# Patient Record
Sex: Female | Born: 1950 | Race: White | Hispanic: No | Marital: Single | Smoking: Never smoker
Health system: Southern US, Community
[De-identification: ages and names within clinical notes are randomized; demographics above are authoritative.]

## PROBLEM LIST (undated history)

## (undated) DIAGNOSIS — E785 Hyperlipidemia, unspecified: Secondary | ICD-10-CM

## (undated) DIAGNOSIS — D649 Anemia, unspecified: Secondary | ICD-10-CM

## (undated) DIAGNOSIS — Z9109 Other allergy status, other than to drugs and biological substances: Secondary | ICD-10-CM

## (undated) DIAGNOSIS — B019 Varicella without complication: Secondary | ICD-10-CM

## (undated) DIAGNOSIS — B1910 Unspecified viral hepatitis B without hepatic coma: Secondary | ICD-10-CM

## (undated) HISTORY — DX: Other allergy status, other than to drugs and biological substances: Z91.09

## (undated) HISTORY — DX: Varicella without complication: B01.9

## (undated) HISTORY — PX: APPENDECTOMY: SHX54

## (undated) HISTORY — PX: ABDOMINAL HYSTERECTOMY: SHX81

## (undated) HISTORY — DX: Hyperlipidemia, unspecified: E78.5

---

## 1976-11-01 DIAGNOSIS — B1910 Unspecified viral hepatitis B without hepatic coma: Secondary | ICD-10-CM

## 1976-11-01 HISTORY — DX: Unspecified viral hepatitis B without hepatic coma: B19.10

## 2001-02-13 ENCOUNTER — Other Ambulatory Visit: Admission: RE | Admit: 2001-02-13 | Discharge: 2001-02-13 | Payer: Self-pay | Admitting: Obstetrics and Gynecology

## 2009-11-01 LAB — HM COLONOSCOPY

## 2011-12-30 ENCOUNTER — Other Ambulatory Visit: Payer: Self-pay

## 2011-12-30 ENCOUNTER — Encounter (HOSPITAL_COMMUNITY): Payer: Self-pay | Admitting: Emergency Medicine

## 2011-12-30 ENCOUNTER — Emergency Department (HOSPITAL_COMMUNITY): Payer: Medicaid Other

## 2011-12-30 ENCOUNTER — Inpatient Hospital Stay (HOSPITAL_COMMUNITY)
Admission: EM | Admit: 2011-12-30 | Discharge: 2012-01-19 | DRG: 028 | Disposition: A | Discharge status: Home Health | Payer: Medicaid Other | Attending: Internal Medicine | Admitting: Internal Medicine

## 2011-12-30 DIAGNOSIS — D649 Anemia, unspecified: Secondary | ICD-10-CM

## 2011-12-30 DIAGNOSIS — E860 Dehydration: Secondary | ICD-10-CM

## 2011-12-30 DIAGNOSIS — R5381 Other malaise: Secondary | ICD-10-CM | POA: Diagnosis not present

## 2011-12-30 DIAGNOSIS — N179 Acute kidney failure, unspecified: Secondary | ICD-10-CM

## 2011-12-30 DIAGNOSIS — T50995A Adverse effect of other drugs, medicaments and biological substances, initial encounter: Secondary | ICD-10-CM | POA: Diagnosis present

## 2011-12-30 DIAGNOSIS — R197 Diarrhea, unspecified: Secondary | ICD-10-CM | POA: Diagnosis present

## 2011-12-30 DIAGNOSIS — Z882 Allergy status to sulfonamides status: Secondary | ICD-10-CM

## 2011-12-30 DIAGNOSIS — Y9210 Unspecified residential institution as the place of occurrence of the external cause: Secondary | ICD-10-CM | POA: Diagnosis present

## 2011-12-30 DIAGNOSIS — Z79899 Other long term (current) drug therapy: Secondary | ICD-10-CM

## 2011-12-30 DIAGNOSIS — G061 Intraspinal abscess and granuloma: Principal | ICD-10-CM | POA: Diagnosis present

## 2011-12-30 DIAGNOSIS — H5462 Unqualified visual loss, left eye, normal vision right eye: Secondary | ICD-10-CM

## 2011-12-30 DIAGNOSIS — R112 Nausea with vomiting, unspecified: Secondary | ICD-10-CM

## 2011-12-30 DIAGNOSIS — B009 Herpesviral infection, unspecified: Secondary | ICD-10-CM | POA: Diagnosis present

## 2011-12-30 DIAGNOSIS — K6812 Psoas muscle abscess: Secondary | ICD-10-CM | POA: Diagnosis present

## 2011-12-30 DIAGNOSIS — N289 Disorder of kidney and ureter, unspecified: Secondary | ICD-10-CM

## 2011-12-30 DIAGNOSIS — M25519 Pain in unspecified shoulder: Secondary | ICD-10-CM | POA: Diagnosis present

## 2011-12-30 DIAGNOSIS — Z8249 Family history of ischemic heart disease and other diseases of the circulatory system: Secondary | ICD-10-CM

## 2011-12-30 DIAGNOSIS — M549 Dorsalgia, unspecified: Secondary | ICD-10-CM

## 2011-12-30 DIAGNOSIS — H209 Unspecified iridocyclitis: Secondary | ICD-10-CM

## 2011-12-30 DIAGNOSIS — J189 Pneumonia, unspecified organism: Secondary | ICD-10-CM | POA: Diagnosis present

## 2011-12-30 DIAGNOSIS — E876 Hypokalemia: Secondary | ICD-10-CM | POA: Diagnosis not present

## 2011-12-30 DIAGNOSIS — H44019 Panophthalmitis (acute), unspecified eye: Secondary | ICD-10-CM | POA: Diagnosis present

## 2011-12-30 DIAGNOSIS — H4419 Other endophthalmitis: Secondary | ICD-10-CM | POA: Diagnosis present

## 2011-12-30 DIAGNOSIS — K117 Disturbances of salivary secretion: Secondary | ICD-10-CM | POA: Diagnosis not present

## 2011-12-30 DIAGNOSIS — L270 Generalized skin eruption due to drugs and medicaments taken internally: Secondary | ICD-10-CM | POA: Diagnosis present

## 2011-12-30 DIAGNOSIS — J181 Lobar pneumonia, unspecified organism: Secondary | ICD-10-CM

## 2011-12-30 HISTORY — DX: Unspecified viral hepatitis B without hepatic coma: B19.10

## 2011-12-30 HISTORY — DX: Anemia, unspecified: D64.9

## 2011-12-30 LAB — URINALYSIS, ROUTINE W REFLEX MICROSCOPIC
Hgb urine dipstick: NEGATIVE
Protein, ur: 30 mg/dL — AB
Urobilinogen, UA: 2 mg/dL — ABNORMAL HIGH (ref 0.0–1.0)

## 2011-12-30 LAB — URINE MICROSCOPIC-ADD ON

## 2011-12-30 LAB — DIFFERENTIAL
Basophils Absolute: 0 10*3/uL (ref 0.0–0.1)
Eosinophils Relative: 0 % (ref 0–5)
Lymphs Abs: 0.7 10*3/uL (ref 0.7–4.0)
Monocytes Absolute: 0.5 10*3/uL (ref 0.1–1.0)
Neutrophils Relative %: 95 % — ABNORMAL HIGH (ref 43–77)

## 2011-12-30 LAB — PROTIME-INR
INR: 1.05 (ref 0.00–1.49)
Prothrombin Time: 13.9 seconds (ref 11.6–15.2)

## 2011-12-30 LAB — LACTIC ACID, PLASMA: Lactic Acid, Venous: 1.4 mmol/L (ref 0.5–2.2)

## 2011-12-30 LAB — COMPREHENSIVE METABOLIC PANEL
ALT: 35 U/L (ref 0–35)
Alkaline Phosphatase: 296 U/L — ABNORMAL HIGH (ref 39–117)
CO2: 20 mEq/L (ref 19–32)
Chloride: 98 mEq/L (ref 96–112)
GFR calc Af Amer: 43 mL/min — ABNORMAL LOW (ref >90)
GFR calc non Af Amer: 37 mL/min — ABNORMAL LOW (ref >90)
Glucose, Bld: 95 mg/dL (ref 70–99)
Potassium: 3.5 mEq/L (ref 3.5–5.1)
Sodium: 132 mEq/L — ABNORMAL LOW (ref 135–145)

## 2011-12-30 LAB — OCCULT BLOOD, POC DEVICE: Fecal Occult Bld: NEGATIVE

## 2011-12-30 LAB — APTT: aPTT: 26 s (ref 24–37)

## 2011-12-30 LAB — CBC
MCH: 30.5 pg (ref 26.0–34.0)
MCV: 90.5 fL (ref 78.0–100.0)
Platelets: 347 10*3/uL (ref 150–400)
RBC: 2.75 MIL/uL — ABNORMAL LOW (ref 3.87–5.11)
RDW: 15.3 % (ref 11.5–15.5)
WBC: 23.9 10*3/uL — ABNORMAL HIGH (ref 4.0–10.5)

## 2011-12-30 MED ORDER — FLUORESCEIN SODIUM 1 MG OP STRP
1.0000 | ORAL_STRIP | Freq: Once | OPHTHALMIC | Status: AC
  Administered 2011-12-30: 1 via OPHTHALMIC
  Filled 2011-12-30: qty 1

## 2011-12-30 MED ORDER — APRACLONIDINE HCL 1 % OP SOLN
1.0000 [drp] | OPHTHALMIC | Status: AC
Start: 2011-12-30 — End: 2011-12-30
  Administered 2011-12-30: 1 [drp] via OPHTHALMIC
  Filled 2011-12-30: qty 0.1

## 2011-12-30 MED ORDER — PREDNISOLONE ACETATE 1 % OP SUSP
1.0000 [drp] | OPHTHALMIC | Status: DC
  Administered 2011-12-30 – 2012-01-02 (×39): 1 [drp] via OPHTHALMIC
  Filled 2011-12-30: qty 1

## 2011-12-30 MED ORDER — TUBERCULIN PPD 5 UNIT/0.1ML ID SOLN
5.0000 [IU] | Freq: Once | INTRADERMAL | Status: AC
  Administered 2011-12-30: 5 [IU] via INTRADERMAL
  Filled 2011-12-30 (×2): qty 0.1

## 2011-12-30 MED ORDER — SCOPOLAMINE HBR 0.25 % OP SOLN
1.0000 [drp] | Freq: Two times a day (BID) | OPHTHALMIC | Status: DC
  Administered 2011-12-30 – 2012-01-02 (×6): 1 [drp] via OPHTHALMIC
  Filled 2011-12-30 (×7): qty 0.1

## 2011-12-30 MED ORDER — IOHEXOL 300 MG/ML  SOLN
75.0000 mL | Freq: Once | INTRAMUSCULAR | Status: AC | PRN
  Administered 2011-12-30: 75 mL via INTRAVENOUS

## 2011-12-30 MED ORDER — ONDANSETRON HCL 4 MG/2ML IJ SOLN
4.0000 mg | Freq: Once | INTRAMUSCULAR | Status: AC
  Administered 2011-12-30: 4 mg via INTRAVENOUS
  Filled 2011-12-30: qty 2

## 2011-12-30 MED ORDER — SODIUM CHLORIDE 0.9 % IV BOLUS (SEPSIS)
500.0000 mL | Freq: Once | INTRAVENOUS | Status: AC
  Administered 2011-12-30: 500 mL via INTRAVENOUS

## 2011-12-30 MED ORDER — IOHEXOL 300 MG/ML  SOLN
20.0000 mL | INTRAMUSCULAR | Status: AC
  Administered 2011-12-30: 20 mL via ORAL

## 2011-12-30 MED ORDER — TETRACAINE HCL 0.5 % OP SOLN
2.0000 [drp] | Freq: Once | OPHTHALMIC | Status: AC
  Administered 2011-12-30: 2 [drp] via OPHTHALMIC
  Filled 2011-12-30: qty 2

## 2011-12-30 MED ORDER — FENTANYL CITRATE 0.05 MG/ML IJ SOLN
50.0000 ug | Freq: Once | INTRAMUSCULAR | Status: AC
  Administered 2011-12-30: 50 ug via INTRAVENOUS
  Filled 2011-12-30: qty 2

## 2011-12-30 MED ORDER — TIMOLOL MALEATE 0.5 % OP SOLN
1.0000 [drp] | Freq: Once | OPHTHALMIC | Status: AC
  Administered 2011-12-30: 1 [drp] via OPHTHALMIC
  Filled 2011-12-30: qty 5

## 2011-12-30 MED ORDER — ACETAMINOPHEN 325 MG PO TABS
650.0000 mg | ORAL_TABLET | Freq: Once | ORAL | Status: AC
  Administered 2011-12-30: 650 mg via ORAL
  Filled 2011-12-30: qty 2

## 2011-12-30 NOTE — ED Notes (Signed)
Attempted to call report.  RN unable to come to phone.  RN to call back. 

## 2011-12-30 NOTE — ED Notes (Signed)
Patient states history of lower back pain currently 8/10 achy dull.

## 2011-12-30 NOTE — ED Provider Notes (Addendum)
MSE: 1 week of generalized weakness, nausea, vomiting, diarrhea.  No vomiting x 48 hours.  Feeling fatigued and run down.  Chronic back pain exacerbated.  This AM awoke with blurry vision and drainage to L eye.  States only seeing "flashes of yellow."  Steamy nonreactive cornea.  R/o glaucoma, needs IOP checked.  IVF, orthostatics, labs. Abdomen soft. IOP OD 7, OS 16  [name], MD 12/30/11 1416  [name], MD 12/30/11 1504

## 2011-12-30 NOTE — ED Notes (Signed)
Is able to eat now she staes

## 2011-12-30 NOTE — ED Notes (Signed)
N/v/d and dry mouth x 1 weak  Also having back pain

## 2011-12-30 NOTE — ED Provider Notes (Cosign Needed Addendum)
History     CSN: [id number]  Arrival date & time 12/30/11  1239   First MD Initiated Contact with Patient 12/30/11 1551      Chief Complaint  Patient presents with  . Emesis    (Consider location/radiation/quality/duration/timing/severity/associated sxs/prior treatment) HPI Comments: Pt reports 1 week of N/V/D, occasionaly chills but she also endorsed some sinus problems and cold symptoms as well.  Has been taking multiple OTC meds for her symptoms including imodium and tylenol and cold remedies.  She reports back has been gettign worse, although vomiting has stopped and is able to drink some, still with some diarrheaa, denies blood.  Feels weak, light headed at times and dehydrated.  She also noted floaters in left eye yesterday and today when she woke, can't see anything except flashes of light from left eye.  Sees fine from right eye, no sig HA or eye pain.  Pt was seen and meds and IVF's given by EDP in triage prior to my evaluation.  Pt reports just wanted some tylenol for her back pain for now.    Patient is a 61 y.o. female presenting with vomiting. The history is provided by the patient.  Emesis  Associated symptoms include chills, diarrhea and a fever. Pertinent negatives include no abdominal pain, no cough and no headaches.    Past Medical History  Diagnosis Date  . Hepatitis B     History reviewed. No pertinent past surgical history.  History reviewed. No pertinent family history.  History  Substance Use Topics  . Smoking status: Never Smoker   . Smokeless tobacco: Not on file  . Alcohol Use: No    OB History    Grav Para Term Preterm Abortions TAB SAB Ect Mult Living                  Review of Systems  Constitutional: Positive for fever, chills, activity change and appetite change.  HENT: Positive for congestion and sinus pressure.   Eyes: Positive for visual disturbance. Negative for pain.  Respiratory: Negative for cough, chest tightness and shortness of  breath.   Cardiovascular: Negative for chest pain.  Gastrointestinal: Positive for nausea, vomiting and diarrhea. Negative for abdominal pain and blood in stool.  Genitourinary: Negative for dysuria.  Musculoskeletal: Positive for back pain.  Skin: Negative for color change and rash.  Neurological: Positive for weakness and light-headedness. Negative for syncope and headaches.  All other systems reviewed and are negative.    Allergies  Sulfur  Home Medications   Current Outpatient Rx  Name Route Sig Dispense Refill  . ACETAMINOPHEN 325 MG PO TABS Oral Take 650 mg by mouth every 6 (six) hours as needed. For pain    . CO ENZYME Q-10 PO Oral Take 1 capsule by mouth daily.    Marland Kitchen FOLIC ACID 800 MCG PO TABS Oral Take 400 mcg by mouth daily.    Marland Kitchen LOPERAMIDE HCL 2 MG PO CAPS Oral Take 2 mg by mouth 4 (four) times daily as needed. For diarrhea    . ADULT MULTIVITAMIN W/MINERALS CH Oral Take 1 tablet by mouth daily.    Marland Kitchen OVER THE COUNTER MEDICATION Oral Take 1 tablet by mouth every 6 (six) hours as needed. Cold and Sinus [employer]      BP 122/71  Pulse 90  Temp(Src) 98.7 F (37.1 C) (Oral)  Resp 21  SpO2 99%  Physical Exam  Nursing note and vitals reviewed. Constitutional: She is oriented to person, place, and time.  She appears well-developed and well-nourished. No distress.  HENT:  Head: Normocephalic and atraumatic.  Eyes: Pupils are unequal.       Cornea is cloudy on left  Cardiovascular: S1 normal and S2 normal.   Abdominal: Soft. Normal appearance. Bowel sounds are increased. There is no tenderness. There is no rebound, no guarding and no CVA tenderness.       With chaperone present, perform rectal examination. Normal tone, nontender, brown stool which was tested heme-negative.  Neurological: She is alert and oriented to person, place, and time. She has normal strength. GCS eye subscore is 4. GCS verbal subscore is 5. GCS motor subscore is 6.  Skin: Skin is warm, dry and  intact. She is not diaphoretic.  Psychiatric: She has a normal mood and affect. Her speech is normal. Thought content normal. Cognition and memory are normal.    ED Course  Procedures (including critical care time)  Labs Reviewed  CBC - Abnormal; Notable for the following:    WBC 23.9 (*)    RBC 2.75 (*)    Hemoglobin 8.4 (*)    HCT 24.9 (*)    All other components within normal limits  DIFFERENTIAL - Abnormal; Notable for the following:    Neutrophils Relative 95 (*)    Lymphocytes Relative 3 (*)    Monocytes Relative 2 (*)    Neutro Abs 22.7 (*)    All other components within normal limits  COMPREHENSIVE METABOLIC PANEL - Abnormal; Notable for the following:    Sodium 132 (*)    BUN 66 (*)    Creatinine, Ser 1.46 (*)    Calcium 8.0 (*)    Albumin 1.6 (*)    AST 41 (*)    Alkaline Phosphatase 296 (*)    GFR calc non Af Amer 37 (*)    GFR calc Af Amer 43 (*)    All other components within normal limits  URINALYSIS, ROUTINE W REFLEX MICROSCOPIC - Abnormal; Notable for the following:    Color, Urine AMBER (*) BIOCHEMICALS MAY BE AFFECTED BY COLOR   APPearance CLOUDY (*)    Bilirubin Urine SMALL (*)    Protein, ur 30 (*)    Urobilinogen, UA 2.0 (*)    Leukocytes, UA TRACE (*)    All other components within normal limits  URINE MICROSCOPIC-ADD ON - Abnormal; Notable for the following:    Squamous Epithelial / LPF FEW (*)    Casts HYALINE CASTS (*) GRANULAR CAST   All other components within normal limits  LIPASE, BLOOD  LACTIC ACID, PLASMA  APTT  PROTIME-INR  OCCULT BLOOD, POC DEVICE   No results found.   1. Visual loss, left eye   2. Nausea vomiting and diarrhea   3. Anemia   4. Renal insufficiency   5. Back pain   6. Herpes simplex     RA sat is 98% and normal.  MDM  WBC is elevated to >20, LFT's are marginally up, renal insuff, probably from dehydration, will need CT and hemoccult, likely ophtho consult and will probably need admission, will continue to  hydrate with IVF's.        5:06 PM Pain is somewhat improved after the IV fentanyl. My plan is to also consult ophthalmology to evaluate her for her left eye vision loss. After CT scan is resulted, will plan on admission to medicine. Patient tells me that approximately 20 years ago she did suffer from any infection with hepatitis B. She was treated by Dr. Alwyn Ren  at the time whom she no longer follows up with. Patient reports that she had no known sequelae from that infection that she is aware of.    5:56 PM Pt to be seen by ophtho.  Pt will then be placed in CDU, holding for CT Scan without IV contrast due to renal insuff, then will need admission to probably medical team pending CT result to ensure no surgical process exists, which I would have low suspicion for since abd is soft without rebound.       Gavin Pound. Lauralei Clouse, MD 12/30/11 1757  7:12 PM I had ordered a non contrast CT of abd/pelvis.  In radiology, technician gave pt a reduced dose of IV contrast as her GFR was still in acceptable range for reduced dose.    Gavin Pound. Judd Mccubbin, MD 12/30/11 1913   7:52 PM Per ophtho, pt has atypical uveitis, suspects systemic illness given other symptoms.    Gavin Pound. Oletta Lamas, MD 12/30/11 1952  Gavin Pound. Oletta Lamas, MD 12/30/11 2052

## 2011-12-30 NOTE — ED Notes (Signed)
Visual Acuity:  Without glasses right eye 20/100, left eye unable to see anything states see white flashes With glasses right eye 20/40, left eye unable to see anything

## 2011-12-30 NOTE — ED Notes (Signed)
Patient states n/v/d intermittent for 1.5 weeks. States feels better then tries to eat becomes nauseated.  Secondary left eye tearing and pink states blurred vision in left eye only. Airway intact bilateral equal chest rise and fall.

## 2011-12-30 NOTE — Consult Note (Signed)
Reason for consult:  Decreased vision OS.    HPI: Donna Tapia is an 61 y.o. female who we are asked to see for further evaluation of decreased vision OS.  The patient reports that she first noted some mild photophobia OS ~72 hours ago, and some occassional floaters OS for the past 48 hrs.  The vision OS gradually became blurry over the prior 24-48 hrs.  She denies prior similar symptoms.  She has had acute on chronic recurrent lower back pain (See ED HPI), which has been associated with recent N, V, D.  No fevers.  No new skin rash.     Past Medical History  Diagnosis Date  . Hepatitis B    History reviewed. No pertinent past surgical history. History reviewed. No pertinent family history. Current Facility-Administered Medications  Medication Dose Route Frequency Provider Last Rate Last Dose  . acetaminophen (TYLENOL) tablet 650 mg  650 mg Oral Once [name], MD   650 mg at 12/30/11 1559  . apraclonidine (IOPIDINE) 1 % ophthalmic solution 1 drop  1 drop Left Eye To Minor [name], MD   1 drop at 12/30/11 1534  . fentaNYL (SUBLIMAZE) injection 50 mcg  50 mcg Intravenous Once [name], MD   50 mcg at 12/30/11 1639  . fluorescein ophthalmic strip 1 strip  1 strip Both Eyes Once [name], MD   1 strip at 12/30/11 1500  . iohexol (OMNIPAQUE) 300 MG/ML solution 20 mL  20 mL Oral Q1 Hr x 2 Medication Radiologist, MD      . ondansetron (ZOFRAN) injection 4 mg  4 mg Intravenous Once [name], MD   4 mg at 12/30/11 1429  . sodium chloride 0.9 % bolus 500 mL  500 mL Intravenous Once [name], MD   500 mL at 12/30/11 1429  . tetracaine (PONTOCAINE) 0.5 % ophthalmic solution 2 drop  2 drop Both Eyes Once [name], MD   2 drop at 12/30/11 1502  . timolol (TIMOPTIC) 0.5 % ophthalmic solution 1 drop  1 drop Left Eye Once [name], MD   1 drop at 12/30/11 1600   Current Outpatient Prescriptions  Medication Sig Dispense Refill  . acetaminophen  (TYLENOL) 325 MG tablet Take 650 mg by mouth every 6 (six) hours as needed. For pain      . CO ENZYME Q-10 PO Take 1 capsule by mouth daily.      . folic acid (FOLVITE) 800 MCG tablet Take 400 mcg by mouth daily.      Marland Kitchen loperamide (IMODIUM) 2 MG capsule Take 2 mg by mouth 4 (four) times daily as needed. For diarrhea      . Multiple Vitamin (MULITIVITAMIN WITH MINERALS) TABS Take 1 tablet by mouth daily.      Marland Kitchen OVER THE COUNTER MEDICATION Take 1 tablet by mouth every 6 (six) hours as needed. Cold and Sinus [employer]       Allergies  Allergen Reactions  . Sulfur    History   Social History  . Marital Status: Single    Spouse Name: N/A    Number of Children: N/A  . Years of Education: N/A   Occupational History  . Not on file.   Social History Main Topics  . Smoking status: Never Smoker   . Smokeless tobacco: Not on file  . Alcohol Use: No  . Drug Use:   . Sexually Active:    Other Topics Concern  . Not on file  Social History Narrative  . No narrative on file    Review of systems: @ROS @  Physical Exam:  Blood pressure 122/71, pulse 90, temperature 98.7 F (37.1 C), temperature source Oral, resp. rate 21, SpO2 99.00%.   VA cc (OTC rdrs):  OD 20/30-2   OS  LP (with some projection)  Pupils:   OD round, reactive to light, no APD            OS round, non- reactive to light, + APD by reverse.  IOP (T pen)  OD 5,6   OS  13,13   CVF: OD full to CF   OS unable.   Motility:  OD full ductions  OS full ductions  Balance/alignment:  Ortho by Phoebe Perch   Slit lamp examination:                                 OD                                       External/adnexa: Normal                                      Lids/lashes:        Normal                                      Conjunctiva        White, quiet        Cornea:              Clear                  AC:                     Deep, quiet                                Iris:                     Normal        Lens:                   Clear                                       OS                                       External/adnexa: Normal                                      Lids/lashes:        Normal  Conjunctiva        Trace hyperemia       Cornea:              Edema, stromal folds; PEE (trace-1+); no epi defect, no dendrites.                  AC:                     Formed; 2-3+ flare; 1-2+ cell; diffuse fibrinous material (strands) throughout and coating anterior lens capsule; "strandy" inferior hypopyon (<78mm) from 5-700                                Iris:                     Fibrinous material coating; non reactive pupil ??PS       Lens:                  Fibrinous material on ant capsule;      Dilated fundus exam: (Neo 2.5; Myd 1%)      OD Vitreous            Clear, quiet                                Optic Disc:       Normal, perfused                      Macula:             Flat                                            Vessels:           Normal caliber,distribution         Periphery:         Flat, attached                                      OS No view.         Labs/studies: Results for orders placed during the hospital encounter of 12/30/11 (from the past 48 hour(s))  CBC     Status: Abnormal   Collection Time   12/30/11  2:10 PM      Component Value Range Comment   WBC 23.9 (*) 4.0 - 10.5 (K/uL)    RBC 2.75 (*) 3.87 - 5.11 (MIL/uL)    Hemoglobin 8.4 (*) 12.0 - 15.0 (g/dL)    HCT 11.9 (*) 14.7 - 46.0 (%)    MCV 90.5  78.0 - 100.0 (fL)    MCH 30.5  26.0 - 34.0 (pg)    MCHC 33.7  30.0 - 36.0 (g/dL)    RDW 82.9  56.2 - 13.0 (%)    Platelets 347  150 - 400 (K/uL)   DIFFERENTIAL     Status: Abnormal   Collection Time   12/30/11  2:10 PM      Component Value Range Comment   Neutrophils Relative 95 (*) 43 - 77 (%)    Lymphocytes Relative 3 (*) 12 - 46 (%)  Monocytes Relative 2 (*) 3 - 12 (%)    Eosinophils Relative 0  0 - 5 (%)     Basophils Relative 0  0 - 1 (%)    Neutro Abs 22.7 (*) 1.7 - 7.7 (K/uL)    Lymphs Abs 0.7  0.7 - 4.0 (K/uL)    Monocytes Absolute 0.5  0.1 - 1.0 (K/uL)    Eosinophils Absolute 0.0  0.0 - 0.7 (K/uL)    Basophils Absolute 0.0  0.0 - 0.1 (K/uL)    WBC Morphology MILD LEFT SHIFT (1-5% METAS, OCC MYELO, OCC BANDS)   TOXIC GRANULATION   Smear Review LARGE PLATELETS PRESENT     COMPREHENSIVE METABOLIC PANEL     Status: Abnormal   Collection Time   12/30/11  2:10 PM      Component Value Range Comment   Sodium 132 (*) 135 - 145 (mEq/L)    Potassium 3.5  3.5 - 5.1 (mEq/L)    Chloride 98  96 - 112 (mEq/L)    CO2 20  19 - 32 (mEq/L)    Glucose, Bld 95  70 - 99 (mg/dL)    BUN 66 (*) 6 - 23 (mg/dL)    Creatinine, Ser 1.61 (*) 0.50 - 1.10 (mg/dL)    Calcium 8.0 (*) 8.4 - 10.5 (mg/dL)    Total Protein 6.1  6.0 - 8.3 (g/dL)    Albumin 1.6 (*) 3.5 - 5.2 (g/dL)    AST 41 (*) 0 - 37 (U/L)    ALT 35  0 - 35 (U/L)    Alkaline Phosphatase 296 (*) 39 - 117 (U/L)    Total Bilirubin 1.0  0.3 - 1.2 (mg/dL)    GFR calc non Af Amer 37 (*) >90 (mL/min)    GFR calc Af Amer 43 (*) >90 (mL/min)   LIPASE, BLOOD     Status: Normal   Collection Time   12/30/11  2:10 PM      Component Value Range Comment   Lipase 22  11 - 59 (U/L)   URINALYSIS, ROUTINE W REFLEX MICROSCOPIC     Status: Abnormal   Collection Time   12/30/11  2:46 PM      Component Value Range Comment   Color, Urine AMBER (*) YELLOW  BIOCHEMICALS MAY BE AFFECTED BY COLOR   APPearance CLOUDY (*) CLEAR     Specific Gravity, Urine 1.018  1.005 - 1.030     pH 5.0  5.0 - 8.0     Glucose, UA NEGATIVE  NEGATIVE (mg/dL)    Hgb urine dipstick NEGATIVE  NEGATIVE     Bilirubin Urine SMALL (*) NEGATIVE     Ketones, ur NEGATIVE  NEGATIVE (mg/dL)    Protein, ur 30 (*) NEGATIVE (mg/dL)    Urobilinogen, UA 2.0 (*) 0.0 - 1.0 (mg/dL)    Nitrite NEGATIVE  NEGATIVE     Leukocytes, UA TRACE (*) NEGATIVE    URINE MICROSCOPIC-ADD ON     Status: Abnormal    Collection Time   12/30/11  2:46 PM      Component Value Range Comment   Squamous Epithelial / LPF FEW (*) RARE     WBC, UA 3-6  <3 (WBC/hpf)    RBC / HPF 0-2  <3 (RBC/hpf)    Bacteria, UA RARE  RARE     Casts HYALINE CASTS (*) NEGATIVE  GRANULAR CAST  LACTIC ACID, PLASMA     Status: Normal   Collection Time   12/30/11  4:15 PM  Component Value Range Comment   Lactic Acid, Venous 1.4  0.5 - 2.2 (mmol/L)   APTT     Status: Normal   Collection Time   12/30/11  4:15 PM      Component Value Range Comment   aPTT 26  24 - 37 (seconds)   PROTIME-INR     Status: Normal   Collection Time   12/30/11  4:15 PM      Component Value Range Comment   Prothrombin Time 13.9  11.6 - 15.2 (seconds)    INR 1.05  0.00 - 1.49    OCCULT BLOOD, POC DEVICE     Status: Normal   Collection Time   12/30/11  5:08 PM      Component Value Range Comment   Fecal Occult Bld NEGATIVE      No results found.                           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   Notable/atypical features of this patient's uveitis include:  Minimal symptoms (lack of  or minimal pain/photophobia), minimal redness, abundance of flare and fibrinous response with minimal/less prominent cell. Notably, there are no KP.     There are several systemic processes that lead to low back pain and uveitis.  Leading considerations include but are not limited to HLA-B27-associated diseases (low back pain that is worse in the morning, heel pain, inflammatory bowel diseases, psoriatic arthritis), herpetic diseases (oral or genital ulcers, history of herpes zoster) or Behet's (oral or genital ulcers).  Always need to consider sarcoid and syphilis.  Initial/Baseline work up should include:  ANA, HLA-B27, RPR, FTA-Abs, CXR and Lyme Ab, PPD    Patient is listed as Hep B + :  There is mixed literature re: Hep B and associated ant uveitis.    Recommend:   - Workup of the uveitic process (as above) and the low back pain.  Will base further eval and treatment on results of initial work up. - In the interim will treat with Scopolamine BID OS, Pred Forte q 1 hr OS.  - Will require out patient follow up, ideally/ultimately with ophthalmologist with access to B-Scan (for eval of any coexistant  vitreo-retinal involvement) and interest/expertise in uveitis.     I will remain available.       All of the above information was relayed to the patient and her accompanying friend.  Ophthalmic warning signs and symptoms were reviewed, and clear instructions for immediate phone contact and/or immediate contact of an ophthalmologist were provided should any of these signs or symptoms occur.  Follow up contact information was provided.  All questions were answered.   Acire Tang 12/30/2011, 5:29 PM  SIGNED at 656 PM  [facility] [phone]

## 2011-12-31 ENCOUNTER — Encounter (HOSPITAL_COMMUNITY): Payer: Self-pay | Admitting: Internal Medicine

## 2011-12-31 ENCOUNTER — Inpatient Hospital Stay (HOSPITAL_COMMUNITY): Payer: Medicaid Other

## 2011-12-31 DIAGNOSIS — J189 Pneumonia, unspecified organism: Secondary | ICD-10-CM

## 2011-12-31 DIAGNOSIS — E860 Dehydration: Secondary | ICD-10-CM | POA: Diagnosis present

## 2011-12-31 DIAGNOSIS — N179 Acute kidney failure, unspecified: Secondary | ICD-10-CM | POA: Diagnosis present

## 2011-12-31 DIAGNOSIS — H2000 Unspecified acute and subacute iridocyclitis: Secondary | ICD-10-CM

## 2011-12-31 DIAGNOSIS — R918 Other nonspecific abnormal finding of lung field: Secondary | ICD-10-CM

## 2011-12-31 DIAGNOSIS — R609 Edema, unspecified: Secondary | ICD-10-CM

## 2011-12-31 DIAGNOSIS — H209 Unspecified iridocyclitis: Secondary | ICD-10-CM | POA: Diagnosis present

## 2011-12-31 DIAGNOSIS — R112 Nausea with vomiting, unspecified: Secondary | ICD-10-CM | POA: Diagnosis present

## 2011-12-31 DIAGNOSIS — J13 Pneumonia due to Streptococcus pneumoniae: Secondary | ICD-10-CM

## 2011-12-31 LAB — CARDIAC PANEL(CRET KIN+CKTOT+MB+TROPI)
CK, MB: 2.4 ng/mL (ref 0.3–4.0)
Relative Index: INVALID (ref 0.0–2.5)
Troponin I: <0.3 ng/mL (ref <0.30)

## 2011-12-31 LAB — RETICULOCYTES
RBC.: 2.56 MIL/uL — ABNORMAL LOW (ref 3.87–5.11)
Retic Ct Pct: 0.6 % (ref 0.4–3.1)

## 2011-12-31 LAB — COMPREHENSIVE METABOLIC PANEL
BUN: 25 mg/dL — ABNORMAL HIGH (ref 6–23)
Calcium: 7.8 mg/dL — ABNORMAL LOW (ref 8.4–10.5)
Creatinine, Ser: 0.82 mg/dL (ref 0.50–1.10)
GFR calc Af Amer: 87 mL/min — ABNORMAL LOW (ref >90)
Glucose, Bld: 105 mg/dL — ABNORMAL HIGH (ref 70–99)
Total Protein: 5.5 g/dL — ABNORMAL LOW (ref 6.0–8.3)

## 2011-12-31 LAB — EXPECTORATED SPUTUM ASSESSMENT W GRAM STAIN, RFLX TO RESP C

## 2011-12-31 LAB — DIFFERENTIAL
Basophils Relative: 0 % (ref 0–1)
Eosinophils Absolute: 0 10*3/uL (ref 0.0–0.7)
Monocytes Absolute: 0.5 10*3/uL (ref 0.1–1.0)
Neutro Abs: 15.1 10*3/uL — ABNORMAL HIGH (ref 1.7–7.7)
Neutrophils Relative %: 92 % — ABNORMAL HIGH (ref 43–77)

## 2011-12-31 LAB — FOLATE: Folate: 8.7 ng/mL

## 2011-12-31 LAB — CBC
HCT: 23.2 % — ABNORMAL LOW (ref 36.0–46.0)
Hemoglobin: 7.7 g/dL — ABNORMAL LOW (ref 12.0–15.0)
MCH: 30.4 pg (ref 26.0–34.0)
MCHC: 33.2 g/dL (ref 30.0–36.0)

## 2011-12-31 LAB — IRON AND TIBC
Iron: 12 ug/dL — ABNORMAL LOW (ref 42–135)
Saturation Ratios: 8 % — ABNORMAL LOW (ref 20–55)
TIBC: 147 ug/dL — ABNORMAL LOW (ref 250–470)
UIBC: 135 ug/dL (ref 125–400)

## 2011-12-31 LAB — TYPE AND SCREEN: ABO/RH(D): O POS

## 2011-12-31 LAB — ANA: Anti Nuclear Antibody(ANA): NEGATIVE

## 2011-12-31 LAB — VITAMIN B12: Vitamin B-12: 1162 pg/mL — ABNORMAL HIGH (ref 211–911)

## 2011-12-31 MED ORDER — HYDROCODONE-ACETAMINOPHEN 10-325 MG PO TABS
1.0000 | ORAL_TABLET | ORAL | Status: DC | PRN
  Administered 2012-01-02 – 2012-01-18 (×29): 1 via ORAL
  Filled 2011-12-31 (×31): qty 1

## 2011-12-31 MED ORDER — ACYCLOVIR 5 % EX OINT
TOPICAL_OINTMENT | CUTANEOUS | Status: DC
  Filled 2011-12-31: qty 30

## 2011-12-31 MED ORDER — IOHEXOL 300 MG/ML  SOLN
70.0000 mL | Freq: Once | INTRAMUSCULAR | Status: AC | PRN
  Administered 2011-12-31: 70 mL via INTRAVENOUS

## 2011-12-31 MED ORDER — ACETAMINOPHEN 325 MG PO TABS
650.0000 mg | ORAL_TABLET | Freq: Four times a day (QID) | ORAL | Status: DC | PRN
  Administered 2011-12-31: 650 mg via ORAL
  Filled 2011-12-31 (×3): qty 2

## 2011-12-31 MED ORDER — ONDANSETRON HCL 4 MG PO TABS
4.0000 mg | ORAL_TABLET | Freq: Four times a day (QID) | ORAL | Status: DC | PRN
  Administered 2012-01-02: 4 mg via ORAL
  Filled 2011-12-31: qty 1

## 2011-12-31 MED ORDER — SODIUM CHLORIDE 0.9 % IV SOLN
INTRAVENOUS | Status: DC
  Administered 2011-12-31 (×2): via INTRAVENOUS
  Administered 2012-01-01: 75 mL via INTRAVENOUS
  Administered 2012-01-03 – 2012-01-07 (×4): via INTRAVENOUS

## 2011-12-31 MED ORDER — MOXIFLOXACIN HCL IN NACL 400 MG/250ML IV SOLN
400.0000 mg | INTRAVENOUS | Status: DC
  Administered 2011-12-31 – 2012-01-03 (×4): 400 mg via INTRAVENOUS
  Filled 2011-12-31 (×5): qty 250

## 2011-12-31 MED ORDER — VALACYCLOVIR HCL 500 MG PO TABS
2000.0000 mg | ORAL_TABLET | Freq: Two times a day (BID) | ORAL | Status: AC
  Administered 2011-12-31 (×2): 2000 mg via ORAL
  Filled 2011-12-31 (×2): qty 4

## 2011-12-31 MED ORDER — ENSURE CLINICAL ST REVIGOR PO LIQD
237.0000 mL | Freq: Two times a day (BID) | ORAL | Status: DC
  Administered 2011-12-31 – 2012-01-19 (×20): 237 mL via ORAL

## 2011-12-31 MED ORDER — ACETAMINOPHEN 650 MG RE SUPP: 650.0000 mg | Freq: Four times a day (QID) | RECTAL | Status: DC | PRN

## 2011-12-31 MED ORDER — ONDANSETRON HCL 4 MG/2ML IJ SOLN
4.0000 mg | Freq: Four times a day (QID) | INTRAMUSCULAR | Status: DC | PRN
  Administered 2012-01-01 – 2012-01-08 (×4): 4 mg via INTRAVENOUS
  Filled 2011-12-31 (×3): qty 2

## 2011-12-31 MED ORDER — FENTANYL CITRATE 0.05 MG/ML IJ SOLN
25.0000 ug | INTRAMUSCULAR | Status: DC | PRN
  Administered 2011-12-31 – 2012-01-05 (×18): 25 ug via INTRAVENOUS
  Filled 2011-12-31 (×18): qty 2

## 2011-12-31 MED ORDER — SODIUM CHLORIDE 0.9 % IJ SOLN
3.0000 mL | Freq: Two times a day (BID) | INTRAMUSCULAR | Status: DC
  Administered 2011-12-31 – 2012-01-15 (×24): 3 mL via INTRAVENOUS

## 2011-12-31 NOTE — Progress Notes (Signed)
Subjective: Patient seen and examined this morning. Admission H&P reviewed.still has absent  vision over left eye and chr low back pain.   Objective:  Vital signs in last 24 hours:  Filed Vitals:   12/30/11 2136 12/30/11 2259 12/31/11 0037 12/31/11 1316  BP: 137/65 125/51 132/67 112/58  Pulse: 90 86 88 85  Temp: 97.9 F (36.6 C)  98.7 F (37.1 C) 98.3 F (36.8 C)  TempSrc: Oral  Oral Oral  Resp: 18 18 20 20   Height:   5\' 9"  (1.753 m)   Weight:   60.3 kg (132 lb 15 oz)   SpO2: 99% 97% 94% 95%    Intake/Output from previous day:   Intake/Output Summary (Last 24 hours) at 12/31/11 1626 Last data filed at 12/31/11 1542  Gross per 24 hour  Intake   1110 ml  Output    800 ml  Net    310 ml    Physical Exam:  General: elderly thin built female in NAD HEENT: constricted left pupil  With no pallor, no icterus, moist oral mucosa, no JVD, no lymphadenopathy. Herpetic lesions over rt lower lip Heart: Normal  s1 &s2  Regular rate and rhythm, without murmurs, rubs, gallops. Lungs:coarse crackles over left side Abdomen: Soft, nontender, nondistended, positive bowel sounds. Extremities: No clubbing cyanosis or edema with positive pedal pulses. Neuro: Alert, awake, oriented x3, nonfocal.   Lab Results:  Basic Metabolic Panel:    Component Value Date/Time   NA 134* 12/31/2011 1155   K 3.2* 12/31/2011 1155   CL 100 12/31/2011 1155   CO2 23 12/31/2011 1155   BUN 25* 12/31/2011 1155   CREATININE 0.82 12/31/2011 1155   GLUCOSE 105* 12/31/2011 1155   CALCIUM 7.8* 12/31/2011 1155   CBC:    Component Value Date/Time   WBC 16.4* 12/31/2011 1155   HGB 7.7* 12/31/2011 1155   HCT 23.2* 12/31/2011 1155   PLT 381 12/31/2011 1155   MCV 91.7 12/31/2011 1155   NEUTROABS 15.1* 12/31/2011 1155   LYMPHSABS 0.8 12/31/2011 1155   MONOABS 0.5 12/31/2011 1155   EOSABS 0.0 12/31/2011 1155   BASOSABS 0.0 12/31/2011 1155    Recent Results (from the past 240 hour(s))  CULTURE, SPUTUM-ASSESSMENT     Status: Normal   Collection Time   12/31/11  2:15 PM      Component Value Range Status Comment   Specimen Description SPUTUM   Final    Special Requests NONE   Final    Sputum evaluation     Final    Value: THIS SPECIMEN IS ACCEPTABLE. RESPIRATORY CULTURE REPORT TO FOLLOW.   Report Status 12/31/2011 FINAL   Final     Studies/Results: Dg Chest 2 View  12/30/2011  *RADIOLOGY REPORT*  Clinical Data: Weakness, nausea, vomiting and diarrhea.  CHEST - 2 VIEW  Comparison: None.  Findings: The lungs are well-aerated.  There is dense airspace consolidation involving much of the left upper lobe, with diffuse air bronchograms and mild left apical sparing.  This is compatible with significant focal pneumonia.  There also appears to be a 5.2 x 3.9 x 3.7 cm mass posteriorly near the left hilum.  This raises concern for malignancy, though the position of the mass suggests against associated postobstructive pneumonia.  In addition, there is question of a 1.8 cm nodule at the right midlung zone, with adjacent smaller airspace opacities, and a possible 1.9 cm nodule at the left midlung zone.  There is no evidence of pneumothorax.  The heart is  normal in size; the mediastinal contour is within normal limits.  No acute osseous abnormalities are seen.  IMPRESSION:  1.  Dense left upper lobe pneumonia noted, with associated air bronchograms. 2.  Apparent 5.2 x 3.9 x 3.7 cm mass posteriorly near the left hilum, raising concern for malignancy; the position of the mass suggests against associated postobstructive pneumonia. 3.  Question of 1.9 cm nodule at the left midlung zone, and a 1.8 cm nodule at the right midlung zone, with adjacent smaller airspace opacities at the right mid lung.  These results were called by telephone on 12/30/2011  at  09:45 p.m. to  Dr. Quita Skye, who verbally acknowledged these results.  Original Report Authenticated By: Tonia Ghent, M.D.   Dg Sacrum/coccyx  12/30/2011  *RADIOLOGY REPORT*  Clinical Data: Lower  back pain and chronic sacrococcygeal pain.  SACRUM AND COCCYX - 2+ VIEW  Comparison: None.  Findings: The frontal views are suboptimal due to overlying contrast and bowel gas.  There is no evidence of fracture or dislocation along the sacrum or coccyx on the lateral view.  Mild degenerative change is noted at the upper lumbar spine, with associated endplate sclerosis.  Mild facet disease is noted at the lower lumbar spine.  Residual contrast is noted within the large bowel.  Clips are seen overlying the right hemipelvis.  IMPRESSION: No evidence of fracture or dislocation along the sacrum or coccyx; evaluation suboptimal due to overlying structures.  Original Report Authenticated By: Tonia Ghent, M.D.   Ct Abdomen Pelvis W Contrast  12/30/2011  *RADIOLOGY REPORT*  Clinical Data: Emesis and back pain.  Leukocytosis.  Calculated GFR is 37.  CT ABDOMEN AND PELVIS WITHOUT CONTRAST  Technique:  Multidetector CT imaging of the abdomen and pelvis was performed following the standard protocol without intravenous contrast.  Comparison: None.  Findings: The CT topogram raises concern for densities in the left hilar region.  This area was not imaged on the cross-sectional imaging.  The lung bases demonstrate air bronchograms and consolidation at the lingula.  There are a few patchy parenchymal densities in the posterior left lower lobe which could be related to volume loss or inflammatory changes.  There is a small amount of volume loss at the base of the right middle lobe. No evidence of free air.  Normal appearance of liver with a focal fat near the falciform ligament.  The portal venous system is patent.  Gallbladder has a normal appearance.  Normal appearance of the spleen, pancreas and adrenal tissue.  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  No evidence for bowel dilatation.  There is a surgical clip along the cecum suggestive for an appendectomy.  The uterus appears to be surgically absent.  No  significant free fluid or lymphadenopathy in the pelvis.  Scoliosis of the lumbar spine. Scoliosis apex is at L3 with severe degenerative changes at L2-L3 and L3-L4.   There is mild heterogeneity along the anterior aspect of the right kidney on sequence two, image 45.  This area is grossly normal on the delayed images and pyelonephritis is thought to be unlikely.  IMPRESSION: Consolidation at the base of the lingula and the CT topogram raises concern for disease in the left hilum and left upper lung.  Few patchy parenchymal densities in the left lower lobe.  The findings raise concern for an inflammatory or infectious etiology in the left lung.  Recommend dedicated chest imaging for further characterization.  No acute abnormalities within the abdomen or pelvis.  The right kidney is slightly malrotated but no evidence for hydronephrosis.  Scoliosis with degenerative changes.  Original Report Authenticated By: Richarda Overlie, M.D.    Medications: Scheduled Meds:   . feeding supplement  237 mL Oral BID  . fentaNYL  50 mcg Intravenous Once  . fentaNYL  50 mcg Intravenous Once  . iohexol  20 mL Oral Q1 Hr x 2  . moxifloxacin  400 mg Intravenous Q24H  . prednisoLONE acetate  1 drop Left Eye Q1H while awake  . scopolamine  1 drop Left Eye BID  . sodium chloride  3 mL Intravenous Q12H  . tuberculin  5 Units Intradermal Once  . valACYclovir  2,000 mg Oral BID  . DISCONTD: acyclovir ointment   Topical Q3H   Continuous Infusions:   . sodium chloride 75 mL/hr at 12/31/11 0156   PRN Meds:.acetaminophen, acetaminophen, fentaNYL, HYDROcodone-acetaminophen, iohexol, ondansetron (ZOFRAN) IV, ondansetron  Assessment: 38 female with chr low back pain and hx of hep B presents with 2 days hx of painless left eye blindness with hx of nausea. Vomiting an diarrhea 1 week back with findings of left anterior uveitis and left lung mass with pneumonia on CT scan.   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 Left post hilar mass  started on avelox and will continue  seen by pulm and recommend CT chest with contrast Will get sputum cx F/up with CT chest   Anemia Unclear etiology, could be ACD from underlying autoimmune vs malignant process Stool for occult blood negative Iron panel sent, b12 wnl. 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

## 2011-12-31 NOTE — Consult Note (Addendum)
Infectious Diseases Initial Consultation  Reason for Consultation:  Uveitis differential.   HPI: Donna Tapia is a 61 y.o. female with a remote history of acute hepatitis B, chronic backpain who has had some recent issues with n/v/d and on 2/28 had awoken with blindness in her left eye.  She came to ED and noted anterior uveitis.  Also notable was an infiltrate in her CXR and she has had some weakness and subjective fever.  No history of signficant eye problems.  No sick contacts.  No other focal deficits, no headache.  She did report some "floaters" in her eye about 2 days prior that had resolved.  Chronic cough without productive sputum.    Past Medical History  Diagnosis Date  . Hepatitis B     Allergies:  Allergies  Allergen Reactions  . Sulfur     Current antibiotics:   MEDICATIONS:    . acetaminophen  650 mg Oral Once  . apraclonidine  1 drop Left Eye To Minor  . feeding supplement  237 mL Oral BID  . fentaNYL  50 mcg Intravenous Once  . fentaNYL  50 mcg Intravenous Once  . fluorescein  1 strip Both Eyes Once  . iohexol  20 mL Oral Q1 Hr x 2  . moxifloxacin  400 mg Intravenous Q24H  . ondansetron (ZOFRAN) IV  4 mg Intravenous Once  . prednisoLONE acetate  1 drop Left Eye Q1H while awake  . scopolamine  1 drop Left Eye BID  . sodium chloride  500 mL Intravenous Once  . sodium chloride  3 mL Intravenous Q12H  . tetracaine  2 drop Both Eyes Once  . timolol  1 drop Left Eye Once  . tuberculin  5 Units Intradermal Once    History  Substance Use Topics  . Smoking status: Never Smoker   . Smokeless tobacco: Not on file  . Alcohol Use: No    Family History  Problem Relation Age of Onset  . Coronary artery disease Mother   . Parkinsonism Mother     Review of Systems - Negative except as per the HPI  OBJECTIVE: Temp:  [97.9 F (36.6 C)-98.7 F (37.1 C)] 98.3 F (36.8 C) (03/01 1316) Pulse Rate:  [82-92] 85  (03/01 1316) Resp:  [18-22] 20  (03/01 1316) BP:  (105-137)/(51-71) 112/58 mmHg (03/01 1316) SpO2:  [94 %-99 %] 95 % (03/01 1316) Weight:  [132 lb 15 oz (60.3 kg)] 132 lb 15 oz (60.3 kg) (03/01 0037) General appearance: alert, cooperative and appears older than stated age Eyes: left eye with notable inflammation Resp: clear to auscultation bilaterally Cardio: regular rate and rhythm, S1, S2 normal, no murmur, click, rub or gallop GI: soft, non-tender; bowel sounds normal; no masses,  no organomegaly Extremities: extremities normal, atraumatic, no cyanosis or edema  LABS: Results for orders placed during the hospital encounter of 12/30/11 (from the past 48 hour(s))  CBC     Status: Abnormal   Collection Time   12/30/11  2:10 PM      Component Value Range Comment   WBC 23.9 (*) 4.0 - 10.5 (K/uL)    RBC 2.75 (*) 3.87 - 5.11 (MIL/uL)    Hemoglobin 8.4 (*) 12.0 - 15.0 (g/dL)    HCT 54.0 (*) 98.1 - 46.0 (%)    MCV 90.5  78.0 - 100.0 (fL)    MCH 30.5  26.0 - 34.0 (pg)    MCHC 33.7  30.0 - 36.0 (g/dL)    RDW 15.3  11.5 - 15.5 (%)    Platelets 347  150 - 400 (K/uL)   DIFFERENTIAL     Status: Abnormal   Collection Time   12/30/11  2:10 PM      Component Value Range Comment   Neutrophils Relative 95 (*) 43 - 77 (%)    Lymphocytes Relative 3 (*) 12 - 46 (%)    Monocytes Relative 2 (*) 3 - 12 (%)    Eosinophils Relative 0  0 - 5 (%)    Basophils Relative 0  0 - 1 (%)    Neutro Abs 22.7 (*) 1.7 - 7.7 (K/uL)    Lymphs Abs 0.7  0.7 - 4.0 (K/uL)    Monocytes Absolute 0.5  0.1 - 1.0 (K/uL)    Eosinophils Absolute 0.0  0.0 - 0.7 (K/uL)    Basophils Absolute 0.0  0.0 - 0.1 (K/uL)    WBC Morphology MILD LEFT SHIFT (1-5% METAS, OCC MYELO, OCC BANDS)   TOXIC GRANULATION   Smear Review LARGE PLATELETS PRESENT     COMPREHENSIVE METABOLIC PANEL     Status: Abnormal   Collection Time   12/30/11  2:10 PM      Component Value Range Comment   Sodium 132 (*) 135 - 145 (mEq/L)    Potassium 3.5  3.5 - 5.1 (mEq/L)    Chloride 98  96 - 112 (mEq/L)    CO2  20  19 - 32 (mEq/L)    Glucose, Bld 95  70 - 99 (mg/dL)    BUN 66 (*) 6 - 23 (mg/dL)    Creatinine, Ser 7.82 (*) 0.50 - 1.10 (mg/dL)    Calcium 8.0 (*) 8.4 - 10.5 (mg/dL)    Total Protein 6.1  6.0 - 8.3 (g/dL)    Albumin 1.6 (*) 3.5 - 5.2 (g/dL)    AST 41 (*) 0 - 37 (U/L)    ALT 35  0 - 35 (U/L)    Alkaline Phosphatase 296 (*) 39 - 117 (U/L)    Total Bilirubin 1.0  0.3 - 1.2 (mg/dL)    GFR calc non Af Amer 37 (*) >90 (mL/min)    GFR calc Af Amer 43 (*) >90 (mL/min)   LIPASE, BLOOD     Status: Normal   Collection Time   12/30/11  2:10 PM      Component Value Range Comment   Lipase 22  11 - 59 (U/L)   URINALYSIS, ROUTINE W REFLEX MICROSCOPIC     Status: Abnormal   Collection Time   12/30/11  2:46 PM      Component Value Range Comment   Color, Urine AMBER (*) YELLOW  BIOCHEMICALS MAY BE AFFECTED BY COLOR   APPearance CLOUDY (*) CLEAR     Specific Gravity, Urine 1.018  1.005 - 1.030     pH 5.0  5.0 - 8.0     Glucose, UA NEGATIVE  NEGATIVE (mg/dL)    Hgb urine dipstick NEGATIVE  NEGATIVE     Bilirubin Urine SMALL (*) NEGATIVE     Ketones, ur NEGATIVE  NEGATIVE (mg/dL)    Protein, ur 30 (*) NEGATIVE (mg/dL)    Urobilinogen, UA 2.0 (*) 0.0 - 1.0 (mg/dL)    Nitrite NEGATIVE  NEGATIVE     Leukocytes, UA TRACE (*) NEGATIVE    URINE MICROSCOPIC-ADD ON     Status: Abnormal   Collection Time   12/30/11  2:46 PM      Component Value Range Comment   Squamous Epithelial / LPF FEW (*)  RARE     WBC, UA 3-6  <3 (WBC/hpf)    RBC / HPF 0-2  <3 (RBC/hpf)    Bacteria, UA RARE  RARE     Casts HYALINE CASTS (*) NEGATIVE  GRANULAR CAST  LACTIC ACID, PLASMA     Status: Normal   Collection Time   12/30/11  4:15 PM      Component Value Range Comment   Lactic Acid, Venous 1.4  0.5 - 2.2 (mmol/L)   APTT     Status: Normal   Collection Time   12/30/11  4:15 PM      Component Value Range Comment   aPTT 26  24 - 37 (seconds)   PROTIME-INR     Status: Normal   Collection Time   12/30/11  4:15 PM       Component Value Range Comment   Prothrombin Time 13.9  11.6 - 15.2 (seconds)    INR 1.05  0.00 - 1.49    OCCULT BLOOD, POC DEVICE     Status: Normal   Collection Time   12/30/11  5:08 PM      Component Value Range Comment   Fecal Occult Bld NEGATIVE     RPR     Status: Normal   Collection Time   12/30/11  8:40 PM      Component Value Range Comment   RPR NON REACTIVE  NON REACTIVE    ANA     Status: Normal   Collection Time   12/30/11  8:40 PM      Component Value Range Comment   ANA NEGATIVE  NEGATIVE    TYPE AND SCREEN     Status: Normal   Collection Time   12/31/11  1:05 AM      Component Value Range Comment   ABO/RH(D) O POS      Antibody Screen NEG      Sample Expiration 01/03/2012     ABO/RH     Status: Normal   Collection Time   12/31/11  1:05 AM      Component Value Range Comment   ABO/RH(D) O POS     PRO B NATRIURETIC PEPTIDE     Status: Abnormal   Collection Time   12/31/11  1:40 AM      Component Value Range Comment   Pro B Natriuretic peptide (BNP) 2107.0 (*) 0 - 125 (pg/mL)   CARDIAC PANEL(CRET KIN+CKTOT+MB+TROPI)     Status: Normal   Collection Time   12/31/11  1:40 AM      Component Value Range Comment   Total CK 29  7 - 177 (U/L)    CK, MB 2.4  0.3 - 4.0 (ng/mL)    Troponin I <0.30  <0.30 (ng/mL)    Relative Index RELATIVE INDEX IS INVALID  0.0 - 2.5    RETICULOCYTES     Status: Abnormal   Collection Time   12/31/11  8:42 AM      Component Value Range Comment   Retic Ct Pct 0.6  0.4 - 3.1 (%)    RBC. 2.56 (*) 3.87 - 5.11 (MIL/uL)    Retic Count, Manual 15.4 (*) 19.0 - 186.0 (K/uL)   SEDIMENTATION RATE     Status: Abnormal   Collection Time   12/31/11  8:42 AM      Component Value Range Comment   Sed Rate 133 (*) 0 - 22 (mm/hr)   CBC     Status: Abnormal   Collection Time   12/31/11  11:55 AM      Component Value Range Comment   WBC 16.4 (*) 4.0 - 10.5 (K/uL)    RBC 2.53 (*) 3.87 - 5.11 (MIL/uL)    Hemoglobin 7.7 (*) 12.0 - 15.0 (g/dL)    HCT 29.5 (*) 62.1 -  46.0 (%)    MCV 91.7  78.0 - 100.0 (fL)    MCH 30.4  26.0 - 34.0 (pg)    MCHC 33.2  30.0 - 36.0 (g/dL)    RDW 30.8  65.7 - 84.6 (%)    Platelets 381  150 - 400 (K/uL)   DIFFERENTIAL     Status: Normal (Preliminary result)   Collection Time   12/31/11 11:55 AM      Component Value Range Comment   Neutrophils Relative PENDING  43 - 77 (%)    Neutro Abs PENDING  1.7 - 7.7 (K/uL)    Band Neutrophils PENDING  0 - 10 (%)    Lymphocytes Relative PENDING  12 - 46 (%)    Lymphs Abs PENDING  0.7 - 4.0 (K/uL)    Monocytes Relative PENDING  3 - 12 (%)    Monocytes Absolute PENDING  0.1 - 1.0 (K/uL)    Eosinophils Relative PENDING  0 - 5 (%)    Eosinophils Absolute PENDING  0.0 - 0.7 (K/uL)    Basophils Relative PENDING  0 - 1 (%)    Basophils Absolute PENDING  0.0 - 0.1 (K/uL)    WBC Morphology PENDING      RBC Morphology PENDING      Smear Review PENDING      nRBC PENDING  0 (/100 WBC)    Metamyelocytes Relative PENDING      Myelocytes PENDING      Promyelocytes Absolute PENDING      Blasts PENDING     COMPREHENSIVE METABOLIC PANEL     Status: Abnormal   Collection Time   12/31/11 11:55 AM      Component Value Range Comment   Sodium 134 (*) 135 - 145 (mEq/L)    Potassium 3.2 (*) 3.5 - 5.1 (mEq/L)    Chloride 100  96 - 112 (mEq/L)    CO2 23  19 - 32 (mEq/L)    Glucose, Bld 105 (*) 70 - 99 (mg/dL)    BUN 25 (*) 6 - 23 (mg/dL) DELTA CHECK NOTED   Creatinine, Ser 0.82  0.50 - 1.10 (mg/dL) DELTA CHECK NOTED   Calcium 7.8 (*) 8.4 - 10.5 (mg/dL)    Total Protein 5.5 (*) 6.0 - 8.3 (g/dL)    Albumin 1.5 (*) 3.5 - 5.2 (g/dL)    AST 50 (*) 0 - 37 (U/L)    ALT 35  0 - 35 (U/L)    Alkaline Phosphatase 341 (*) 39 - 117 (U/L)    Total Bilirubin 1.2  0.3 - 1.2 (mg/dL)    GFR calc non Af Amer 75 (*) >90 (mL/min)    GFR calc Af Amer 87 (*) >90 (mL/min)     MICRO:  IMAGING: Dg Chest 2 View  12/30/2011  *RADIOLOGY REPORT*  Clinical Data: Weakness, nausea, vomiting and diarrhea.  CHEST - 2 VIEW   Comparison: None.  Findings: The lungs are well-aerated.  There is dense airspace consolidation involving much of the left upper lobe, with diffuse air bronchograms and mild left apical sparing.  This is compatible with significant focal pneumonia.  There also appears to be a 5.2 x 3.9 x 3.7 cm mass posteriorly near the left  hilum.  This raises concern for malignancy, though the position of the mass suggests against associated postobstructive pneumonia.  In addition, there is question of a 1.8 cm nodule at the right midlung zone, with adjacent smaller airspace opacities, and a possible 1.9 cm nodule at the left midlung zone.  There is no evidence of pneumothorax.  The heart is normal in size; the mediastinal contour is within normal limits.  No acute osseous abnormalities are seen.  IMPRESSION:  1.  Dense left upper lobe pneumonia noted, with associated air bronchograms. 2.  Apparent 5.2 x 3.9 x 3.7 cm mass posteriorly near the left hilum, raising concern for malignancy; the position of the mass suggests against associated postobstructive pneumonia. 3.  Question of 1.9 cm nodule at the left midlung zone, and a 1.8 cm nodule at the right midlung zone, with adjacent smaller airspace opacities at the right mid lung.  These results were called by telephone on 12/30/2011  at  09:45 p.m. to  Dr. Quita Skye, who verbally acknowledged these results.  Original Report Authenticated By: Tonia Ghent, M.D.   Dg Sacrum/coccyx  12/30/2011  *RADIOLOGY REPORT*  Clinical Data: Lower back pain and chronic sacrococcygeal pain.  SACRUM AND COCCYX - 2+ VIEW  Comparison: None.  Findings: The frontal views are suboptimal due to overlying contrast and bowel gas.  There is no evidence of fracture or dislocation along the sacrum or coccyx on the lateral view.  Mild degenerative change is noted at the upper lumbar spine, with associated endplate sclerosis.  Mild facet disease is noted at the lower lumbar spine.  Residual contrast is  noted within the large bowel.  Clips are seen overlying the right hemipelvis.  IMPRESSION: No evidence of fracture or dislocation along the sacrum or coccyx; evaluation suboptimal due to overlying structures.  Original Report Authenticated By: Tonia Ghent, M.D.   Ct Abdomen Pelvis W Contrast  12/30/2011  *RADIOLOGY REPORT*  Clinical Data: Emesis and back pain.  Leukocytosis.  Calculated GFR is 37.  CT ABDOMEN AND PELVIS WITHOUT CONTRAST  Technique:  Multidetector CT imaging of the abdomen and pelvis was performed following the standard protocol without intravenous contrast.  Comparison: None.  Findings: The CT topogram raises concern for densities in the left hilar region.  This area was not imaged on the cross-sectional imaging.  The lung bases demonstrate air bronchograms and consolidation at the lingula.  There are a few patchy parenchymal densities in the posterior left lower lobe which could be related to volume loss or inflammatory changes.  There is a small amount of volume loss at the base of the right middle lobe. No evidence of free air.  Normal appearance of liver with a focal fat near the falciform ligament.  The portal venous system is patent.  Gallbladder has a normal appearance.  Normal appearance of the spleen, pancreas and adrenal tissue.  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  No evidence for bowel dilatation.  There is a surgical clip along the cecum suggestive for an appendectomy.  The uterus appears to be surgically absent.  No significant free fluid or lymphadenopathy in the pelvis.  Scoliosis of the lumbar spine. Scoliosis apex is at L3 with severe degenerative changes at L2-L3 and L3-L4.   There is mild heterogeneity along the anterior aspect of the right kidney on sequence two, image 45.  This area is grossly normal on the delayed images and pyelonephritis is thought to be unlikely.  IMPRESSION: Consolidation at the base of the  lingula and the CT topogram raises  concern for disease in the left hilum and left upper lung.  Few patchy parenchymal densities in the left lower lobe.  The findings raise concern for an inflammatory or infectious etiology in the left lung.  Recommend dedicated chest imaging for further characterization.  No acute abnormalities within the abdomen or pelvis.  The right kidney is slightly malrotated but no evidence for hydronephrosis.  Scoliosis with degenerative changes.  Original Report Authenticated By: Richarda Overlie, M.D.    HISTORICAL MICRO/IMAGING  Assessment/Plan:  Anterior uveitis  1) Uveitis - work up has been initiated by ophthalmology.  I have added CMV PCR, toxo antibodies, HIV, bartonella.  Sarcoid also on the differential with the pulmonary findings.  Checking ACE level, alk phos level also up.  Will try to rule out other etiologies.  2) herpes labialis - I treated her with Valtrex  3) ? Pneumonia - she is on moxifloxacin and WBC has improved.

## 2011-12-31 NOTE — Consult Note (Addendum)
Name: Donna Tapia MRN: [id number] DOB: 1950/12/22    LOS: 1 Requesting MD: [facility]  PCCM CONSULT NOTE  History of Present Illness: 61 yo WF , never smoker, reports 2 weeks of nasal drainage, coughing with brown sputum, fever and increased weakness. 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 It was recommended that systemic causes of uveitis be excluded. A CXR was performed revealing finding CW pneumonia and Pulm Med is asked to evaluate further.  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   Lines / Drains:   Cultures: 3/1 bc x 2 >> 3/1 uc >>  Antibiotics: 3/1 avelox >>  Tests / Events:   Subjective:  Past Medical History  Diagnosis Date  . Hepatitis B    Past Surgical History  Procedure Date  . Abdominal hysterectomy    Prior to Admission medications   Medication Sig Start Date End Date Taking? Authorizing Provider  acetaminophen (TYLENOL) 325 MG tablet Take 650 mg by mouth every 6 (six) hours as needed. For pain   Yes Historical Provider, MD  CO ENZYME Q-10 PO Take 1 capsule by mouth daily.   Yes Historical Provider, MD  folic acid (FOLVITE) 800 MCG tablet Take 400 mcg by mouth daily.   Yes Historical Provider, MD  loperamide (IMODIUM) 2 MG capsule Take 2 mg by mouth 4 (four) times daily as needed. For diarrhea   Yes Historical Provider, MD  Multiple Vitamin (MULITIVITAMIN WITH MINERALS) TABS Take 1 tablet by mouth daily.   Yes Historical Provider, MD  OVER THE COUNTER MEDICATION Take 1 tablet by mouth every 6 (six) hours as needed. Cold and Sinus [employer]   Yes Historical Provider, MD   Allergies Allergies  Allergen Reactions  . Sulfur     Family History Family History  Problem Relation Age of Onset  . Coronary artery disease Mother   . Parkinsonism Mother     Social History  reports that she has never smoked. She does not have any smokeless  tobacco history on file. She reports that she does not drink alcohol. Her drug history not on file.  Review Of Systems  11 points review of systems is negative with an exception of listed in HPI.  Vital Signs: Temp:  [97.9 F (36.6 C)-98.7 F (37.1 C)] 98.7 F (37.1 C) (03/01 0037) Pulse Rate:  [82-98] 88  (03/01 0037) Resp:  [18-22] 20  (03/01 0037) BP: (102-137)/(49-71) 132/67 mmHg (03/01 0037) SpO2:  [94 %-99 %] 94 % (03/01 0037) Weight:  [132 lb 15 oz (60.3 kg)] 132 lb 15 oz (60.3 kg) (03/01 0037) I/O last 3 completed shifts: In: 630 [I.V.:380; IV Piggyback:250] Out: 350 [Urine:350]  Physical Examination: General:  Sickly 61 yo wf. Neuro:  Left blindness, lethargic but no overt deficits.    HEENT:  No jvd. Lt eye as noted.  Cardiovascular:  hsr rrr Lungs:  Normal on R. Posterior crackles in L mid lung field. Prominent egophony anteriorly on L Abdomen:  + bs Musculoskeletal:  Intact, no edema Skin:  No lesions noted      Labs and Imaging:  CXR: LUL consolidation with prominent air bronchograms   Lab 12/30/11 1410  NA 132*  K 3.5  CL 98  CO2 20  BUN 66*  CREATININE 1.46*  GLUCOSE 95    Lab 12/30/11 1410  HGB 8.4*  HCT 24.9*  WBC 23.9*  PLT 347    Assessment and Plan: Abnormal CXR in setting of acute anterior uveitis: The radiographic appearance and clinical history are most suggestive of bacterial PNA. Sarcoidosis would be the most common pulmonary dx associated with uveitis. Her CXR is not typical of sarcoidosis, however. Plan/Rec:  -agree with abx - Moxifloxacin  -Check CT chest.  -sputum culture with gram stain if possible   Uveitis  -Mgmt per Opthalmology    Brett Canales Minor ACNP Adolph Pollack PCCM Pager [phone] till 3 pm If no answer page [phone]  Pt seen and examined and database reviewed. I agree with above findings, assessment and plan. The above note has been amended and edited by me.   Billy Fischer, MD;  PCCM service; Mobile [phone]

## 2011-12-31 NOTE — Progress Notes (Signed)
Utilization review completed.  

## 2011-12-31 NOTE — H&P (Addendum)
Donna Tapia is an 62 y.o. female.   PCP - None Chief Complaint: Left eye blindness. HPI: 61 year-old female with previous history of chronic low back pain and history of hepatitis B being treated more than 20 years ago presents to the ER because of sudden onset of loss of her vision in the left eye when she woke up yesterday morning. Patient states she's been having nausea vomiting and diarrhea last one week which actually got better one week ago and day before yesterday patient experience some floaters in the left eye and when she woke up yesterday morning she became completely blind in that eye. She did not have any pain. She did not have any focal deficit headache. Patient denied abdominal pain chest pain did have some cough and also had developed some blisters on the lower lip. Patient denies any fever chills. And her cough was nonproductive. She also experienced some weight loss. She has not had any joint pains other than low back pain which is chronic. Has not traveled anywhere recently. In the ER patient was examined by the ophthalmologist who found that her left eye and anterior uveitis and had recommended further workup for systemic causes. The ER physician had done CT abdomen pelvis which shows some features of left lung inflammation and hilar adenopathy and a dedicated CT chest was recommended. In addition patient is also found to have severe anemia.  Past Medical History  Diagnosis Date  . Hepatitis B     Past Surgical History  Procedure Date  . Abdominal hysterectomy     Family History  Problem Relation Age of Onset  . Coronary artery disease Mother   . Parkinsonism Mother    Social History:  reports that she has never smoked. She does not have any smokeless tobacco history on file. She reports that she does not drink alcohol. Her drug history not on file.  Allergies:  Allergies  Allergen Reactions  . Sulfur     Medications Prior to Admission  Medication Dose Route  Frequency Provider Last Rate Last Dose  . 0.9 %  sodium chloride infusion   Intravenous Continuous [name], MD      . acetaminophen (TYLENOL) tablet 650 mg  650 mg Oral Q6H PRN [name], MD       Or  . acetaminophen (TYLENOL) suppository 650 mg  650 mg Rectal Q6H PRN [name], MD      . acetaminophen (TYLENOL) tablet 650 mg  650 mg Oral Once [name], MD   650 mg at 12/30/11 1559  . apraclonidine (IOPIDINE) 1 % ophthalmic solution 1 drop  1 drop Left Eye To Minor [name], MD   1 drop at 12/30/11 1534  . fentaNYL (SUBLIMAZE) injection 25 mcg  25 mcg Intravenous Q3H PRN [name], MD      . fentaNYL (SUBLIMAZE) injection 50 mcg  50 mcg Intravenous Once [name], MD   50 mcg at 12/30/11 1639  . fentaNYL (SUBLIMAZE) injection 50 mcg  50 mcg Intravenous Once [name], MD   50 mcg at 12/30/11 2040  . fluorescein ophthalmic strip 1 strip  1 strip Both Eyes Once [name], MD   1 strip at 12/30/11 1500  . iohexol (OMNIPAQUE) 300 MG/ML solution 20 mL  20 mL Oral Q1 Hr x 2 Medication Radiologist, MD   20 mL at 12/30/11 1848  . iohexol (OMNIPAQUE) 300 MG/ML solution 75 mL  75 mL Intravenous Once  PRN Medication Radiologist, MD   75 mL at 12/30/11 1856  . moxifloxacin (AVELOX) IVPB 400 mg  400 mg Intravenous Q24H [name], MD      . ondansetron [facility]) injection 4 mg  4 mg Intravenous Once [name], MD   4 mg at 12/30/11 1429  . ondansetron (ZOFRAN) tablet 4 mg  4 mg Oral Q6H PRN [name], MD       Or  . ondansetron [facility]) injection 4 mg  4 mg Intravenous Q6H PRN [name], MD      . prednisoLONE acetate (PRED FORTE) 1 % ophthalmic suspension 1 drop  1 drop Left Eye Q1H while awake Antony Contras, MD   1 drop at 12/30/11 2200  . scopolamine (HYOSCINE) 0.25 % ophthalmic solution 1 drop  1 drop Left Eye BID Antony Contras, MD   1 drop at 12/30/11 2200  . sodium chloride 0.9 % bolus 500 mL   500 mL Intravenous Once [name], MD   500 mL at 12/30/11 1429  . sodium chloride 0.9 % injection 3 mL  3 mL Intravenous Q12H [name], MD      . tetracaine (PONTOCAINE) 0.5 % ophthalmic solution 2 drop  2 drop Both Eyes Once [name], MD   2 drop at 12/30/11 1502  . timolol (TIMOPTIC) 0.5 % ophthalmic solution 1 drop  1 drop Left Eye Once [name], MD   1 drop at 12/30/11 1600  . tuberculin injection 5 Units  5 Units Intradermal Once Antony Contras, MD   5 Units at 12/30/11 2044   No current outpatient prescriptions on file as of 12/31/2011.    Results for orders placed during the hospital encounter of 12/30/11 (from the past 48 hour(s))  CBC     Status: Abnormal   Collection Time   12/30/11  2:10 PM      Component Value Range Comment   WBC 23.9 (*) 4.0 - 10.5 (K/uL)    RBC 2.75 (*) 3.87 - 5.11 (MIL/uL)    Hemoglobin 8.4 (*) 12.0 - 15.0 (g/dL)    HCT 96.0 (*) 45.4 - 46.0 (%)    MCV 90.5  78.0 - 100.0 (fL)    MCH 30.5  26.0 - 34.0 (pg)    MCHC 33.7  30.0 - 36.0 (g/dL)    RDW 09.8  11.9 - 14.7 (%)    Platelets 347  150 - 400 (K/uL)   DIFFERENTIAL     Status: Abnormal   Collection Time   12/30/11  2:10 PM      Component Value Range Comment   Neutrophils Relative 95 (*) 43 - 77 (%)    Lymphocytes Relative 3 (*) 12 - 46 (%)    Monocytes Relative 2 (*) 3 - 12 (%)    Eosinophils Relative 0  0 - 5 (%)    Basophils Relative 0  0 - 1 (%)    Neutro Abs 22.7 (*) 1.7 - 7.7 (K/uL)    Lymphs Abs 0.7  0.7 - 4.0 (K/uL)    Monocytes Absolute 0.5  0.1 - 1.0 (K/uL)    Eosinophils Absolute 0.0  0.0 - 0.7 (K/uL)    Basophils Absolute 0.0  0.0 - 0.1 (K/uL)    WBC Morphology MILD LEFT SHIFT (1-5% METAS, OCC MYELO, OCC BANDS)   TOXIC GRANULATION   Smear Review LARGE PLATELETS PRESENT     COMPREHENSIVE METABOLIC PANEL     Status: Abnormal   Collection Time  12/30/11  2:10 PM      Component Value Range Comment   Sodium 132 (*) 135 - 145 (mEq/L)    Potassium 3.5  3.5 - 5.1  (mEq/L)    Chloride 98  96 - 112 (mEq/L)    CO2 20  19 - 32 (mEq/L)    Glucose, Bld 95  70 - 99 (mg/dL)    BUN 66 (*) 6 - 23 (mg/dL)    Creatinine, Ser 0.96 (*) 0.50 - 1.10 (mg/dL)    Calcium 8.0 (*) 8.4 - 10.5 (mg/dL)    Total Protein 6.1  6.0 - 8.3 (g/dL)    Albumin 1.6 (*) 3.5 - 5.2 (g/dL)    AST 41 (*) 0 - 37 (U/L)    ALT 35  0 - 35 (U/L)    Alkaline Phosphatase 296 (*) 39 - 117 (U/L)    Total Bilirubin 1.0  0.3 - 1.2 (mg/dL)    GFR calc non Af Amer 37 (*) >90 (mL/min)    GFR calc Af Amer 43 (*) >90 (mL/min)   LIPASE, BLOOD     Status: Normal   Collection Time   12/30/11  2:10 PM      Component Value Range Comment   Lipase 22  11 - 59 (U/L)   URINALYSIS, ROUTINE W REFLEX MICROSCOPIC     Status: Abnormal   Collection Time   12/30/11  2:46 PM      Component Value Range Comment   Color, Urine AMBER (*) YELLOW  BIOCHEMICALS MAY BE AFFECTED BY COLOR   APPearance CLOUDY (*) CLEAR     Specific Gravity, Urine 1.018  1.005 - 1.030     pH 5.0  5.0 - 8.0     Glucose, UA NEGATIVE  NEGATIVE (mg/dL)    Hgb urine dipstick NEGATIVE  NEGATIVE     Bilirubin Urine SMALL (*) NEGATIVE     Ketones, ur NEGATIVE  NEGATIVE (mg/dL)    Protein, ur 30 (*) NEGATIVE (mg/dL)    Urobilinogen, UA 2.0 (*) 0.0 - 1.0 (mg/dL)    Nitrite NEGATIVE  NEGATIVE     Leukocytes, UA TRACE (*) NEGATIVE    URINE MICROSCOPIC-ADD ON     Status: Abnormal   Collection Time   12/30/11  2:46 PM      Component Value Range Comment   Squamous Epithelial / LPF FEW (*) RARE     WBC, UA 3-6  <3 (WBC/hpf)    RBC / HPF 0-2  <3 (RBC/hpf)    Bacteria, UA RARE  RARE     Casts HYALINE CASTS (*) NEGATIVE  GRANULAR CAST  LACTIC ACID, PLASMA     Status: Normal   Collection Time   12/30/11  4:15 PM      Component Value Range Comment   Lactic Acid, Venous 1.4  0.5 - 2.2 (mmol/L)   APTT     Status: Normal   Collection Time   12/30/11  4:15 PM      Component Value Range Comment   aPTT 26  24 - 37 (seconds)   PROTIME-INR     Status:  Normal   Collection Time   12/30/11  4:15 PM      Component Value Range Comment   Prothrombin Time 13.9  11.6 - 15.2 (seconds)    INR 1.05  0.00 - 1.49    OCCULT BLOOD, POC DEVICE     Status: Normal   Collection Time   12/30/11  5:08 PM  Component Value Range Comment   Fecal Occult Bld NEGATIVE      Dg Chest 2 View  12/30/2011  *RADIOLOGY REPORT*  Clinical Data: Weakness, nausea, vomiting and diarrhea.  CHEST - 2 VIEW  Comparison: None.  Findings: The lungs are well-aerated.  There is dense airspace consolidation involving much of the left upper lobe, with diffuse air bronchograms and mild left apical sparing.  This is compatible with significant focal pneumonia.  There also appears to be a 5.2 x 3.9 x 3.7 cm mass posteriorly near the left hilum.  This raises concern for malignancy, though the position of the mass suggests against associated postobstructive pneumonia.  In addition, there is question of a 1.8 cm nodule at the right midlung zone, with adjacent smaller airspace opacities, and a possible 1.9 cm nodule at the left midlung zone.  There is no evidence of pneumothorax.  The heart is normal in size; the mediastinal contour is within normal limits.  No acute osseous abnormalities are seen.  IMPRESSION:  1.  Dense left upper lobe pneumonia noted, with associated air bronchograms. 2.  Apparent 5.2 x 3.9 x 3.7 cm mass posteriorly near the left hilum, raising concern for malignancy; the position of the mass suggests against associated postobstructive pneumonia. 3.  Question of 1.9 cm nodule at the left midlung zone, and a 1.8 cm nodule at the right midlung zone, with adjacent smaller airspace opacities at the right mid lung.  These results were called by telephone on 12/30/2011  at  09:45 p.m. to  Dr. Quita Skye, who verbally acknowledged these results.  Original Report Authenticated By: Tonia Ghent, M.D.   Dg Sacrum/coccyx  12/30/2011  *RADIOLOGY REPORT*  Clinical Data: Lower back pain and  chronic sacrococcygeal pain.  SACRUM AND COCCYX - 2+ VIEW  Comparison: None.  Findings: The frontal views are suboptimal due to overlying contrast and bowel gas.  There is no evidence of fracture or dislocation along the sacrum or coccyx on the lateral view.  Mild degenerative change is noted at the upper lumbar spine, with associated endplate sclerosis.  Mild facet disease is noted at the lower lumbar spine.  Residual contrast is noted within the large bowel.  Clips are seen overlying the right hemipelvis.  IMPRESSION: No evidence of fracture or dislocation along the sacrum or coccyx; evaluation suboptimal due to overlying structures.  Original Report Authenticated By: Tonia Ghent, M.D.   Ct Abdomen Pelvis W Contrast  12/30/2011  *RADIOLOGY REPORT*  Clinical Data: Emesis and back pain.  Leukocytosis.  Calculated GFR is 37.  CT ABDOMEN AND PELVIS WITHOUT CONTRAST  Technique:  Multidetector CT imaging of the abdomen and pelvis was performed following the standard protocol without intravenous contrast.  Comparison: None.  Findings: The CT topogram raises concern for densities in the left hilar region.  This area was not imaged on the cross-sectional imaging.  The lung bases demonstrate air bronchograms and consolidation at the lingula.  There are a few patchy parenchymal densities in the posterior left lower lobe which could be related to volume loss or inflammatory changes.  There is a small amount of volume loss at the base of the right middle lobe. No evidence of free air.  Normal appearance of liver with a focal fat near the falciform ligament.  The portal venous system is patent.  Gallbladder has a normal appearance.  Normal appearance of the spleen, pancreas and adrenal tissue.  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  No evidence for bowel dilatation.  There is a surgical clip along the cecum suggestive for an appendectomy.  The uterus appears to be surgically absent.  No significant  free fluid or lymphadenopathy in the pelvis.  Scoliosis of the lumbar spine. Scoliosis apex is at L3 with severe degenerative changes at L2-L3 and L3-L4.   There is mild heterogeneity along the anterior aspect of the right kidney on sequence two, image 45.  This area is grossly normal on the delayed images and pyelonephritis is thought to be unlikely.  IMPRESSION: Consolidation at the base of the lingula and the CT topogram raises concern for disease in the left hilum and left upper lung.  Few patchy parenchymal densities in the left lower lobe.  The findings raise concern for an inflammatory or infectious etiology in the left lung.  Recommend dedicated chest imaging for further characterization.  No acute abnormalities within the abdomen or pelvis.  The right kidney is slightly malrotated but no evidence for hydronephrosis.  Scoliosis with degenerative changes.  Original Report Authenticated By: Richarda Overlie, M.D.    Review of Systems  Constitutional: Negative.   HENT: Negative.   Eyes:       Loss of vision in the left eye.  Respiratory: Negative.   Cardiovascular: Negative.   Gastrointestinal: Positive for nausea, vomiting and diarrhea.  Genitourinary: Negative.   Musculoskeletal: Negative.   Skin: Negative.   Neurological: Negative.   Endo/Heme/Allergies: Negative.   Psychiatric/Behavioral: Negative.     Blood pressure 132/67, pulse 88, temperature 98.7 F (37.1 C), temperature source Oral, resp. rate 20, height 5\' 9"  (1.753 m), weight 60.3 kg (132 lb 15 oz), SpO2 94.00%. Physical Exam  Constitutional: She is oriented to person, place, and time. She appears well-developed and well-nourished. No distress.  HENT:  Head: Normocephalic and atraumatic.  Right Ear: External ear normal.  Left Ear: External ear normal.  Nose: Nose normal.  Mouth/Throat: Oropharynx is clear and moist. No oropharyngeal exudate.  Eyes:       Pupils constricted in the left eye and patient is not able to see  anything from left eye. Right eye is dilated.  Neck: Normal range of motion. Neck supple.  Cardiovascular: Normal rate and regular rhythm.   Respiratory: Effort normal and breath sounds normal. No respiratory distress. She has no wheezes.  GI: Soft. Bowel sounds are normal. She exhibits no distension. There is no tenderness. There is no rebound.  Musculoskeletal: Normal range of motion. She exhibits edema.  Neurological: She is alert and oriented to person, place, and time.       Patient is alert awake oriented time place and person moves upper and lower extremity is 5/5.  Skin: Skin is warm and dry. She is not diaphoretic.  Psychiatric: Her behavior is normal.     Assessment/Plan #1. Dehydration with acute renal failure - at this time her dehydration may be from her nausea vomiting and diarrhea and I assume that her creatinine will improve with hydration. Her CT does not show any hydronephrosis. We do not have a baseline creatinine on her. #2. Anterior uveitis  - at this time Dr. Cheree Ditto from ophthalmology has already evaluated and recommended treatment for her anterior uveitis. He also has recommended to get further labs including PPD, ANA, RPR, FTA-ABS, workup for sarcoidosis and syphilis. Other differentials to workup includes inflammatory bowel disease, psoriatic arthritis and other conditions involving HLA-B27 and Behcet's disease and herpes. Please see ophthalmology consult note. At this time I have discussed with pulmonologist given her CT findings  of the left hilar and left parenchymal disease. They will be seeing patient in consult. Once her creatinine improves we may order CT chest with contrast or else we may have to order CT chest without contrast. For now patient will be on Avelox considering infiltrates in the lung field. #3. Anemia - the patient's stool is guaiac negative. We will check anemia profile and recheck CBC in a.m. and we will type and screen PRBCs and hold. Will need further  evaluation for anemia including GI workup. I have ordered peripheral smear study. #4. Leukocytosis - recheck CBC in a.m. May be reactionary. I have ordered blood cultures. On Avelox for possible infiltrates in the lung. #5. History of hepatitis B and being treated as per patient - at this time her alkaline phosphatase is high. We will get acute hepatitis panel and also check antimitochondrial antibodies. #6. Lower extremity edema - patient states this is new for her. Check BNP and 2-D echo and lower extremity Dopplers. #7. Low back pain -  Patient has chronic low back pain but has but feels it's more than usual. CT abdomen and pelvis done shows arthritic changes in the lumbar spine. Get PT consult. Patient is also on pain relief medications. If the low back pain does not improve may consider orthopedic consult. #8. 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   CODE STATUS - full code.   Roseanne Juenger N. 12/31/2011, 12:42 AM

## 2011-12-31 NOTE — Progress Notes (Signed)
INITIAL ADULT NUTRITION ASSESSMENT Date: 12/31/2011   Time: 10:53 AM Reason for Assessment: Nutrition Risk  ASSESSMENT: Female 61 y.o.  Dx: N/V/D, Left eye loss of vision,   Hx:  Past Medical History  Diagnosis Date  . Hepatitis B     Related Meds:     . acetaminophen  650 mg Oral Once  . apraclonidine  1 drop Left Eye To Minor  . fentaNYL  50 mcg Intravenous Once  . fentaNYL  50 mcg Intravenous Once  . fluorescein  1 strip Both Eyes Once  . iohexol  20 mL Oral Q1 Hr x 2  . moxifloxacin  400 mg Intravenous Q24H  . ondansetron (ZOFRAN) IV  4 mg Intravenous Once  . prednisoLONE acetate  1 drop Left Eye Q1H while awake  . scopolamine  1 drop Left Eye BID  . sodium chloride  500 mL Intravenous Once  . sodium chloride  3 mL Intravenous Q12H  . tetracaine  2 drop Both Eyes Once  . timolol  1 drop Left Eye Once  . tuberculin  5 Units Intradermal Once     Ht: 5\' 9"  (175.3 cm)  Wt: 132 lb 15 oz (60.3 kg)  Ideal Wt: 65.9 kg % Ideal Wt: 91%  Usual Wt: 145 lbs, 65.9 kg % Usual Wt: 91%  Body mass index is 19.63 kg/(m^2). WNL  Food/Nutrition Related Hx: patient reports poor po intake related to stress starting back in October 2012. Patient started to have N/V/D about 1-2 weeks ago. Patient reports dr mouth also limiting PO intake. Mostly eats soup, fruits, vegetables.   Labs:  CMP     Component Value Date/Time   NA 132* 12/30/2011 1410   K 3.5 12/30/2011 1410   CL 98 12/30/2011 1410   CO2 20 12/30/2011 1410   GLUCOSE 95 12/30/2011 1410   BUN 66* 12/30/2011 1410   CREATININE 1.46* 12/30/2011 1410   CALCIUM 8.0* 12/30/2011 1410   PROT 6.1 12/30/2011 1410   ALBUMIN 1.6* 12/30/2011 1410   AST 41* 12/30/2011 1410   ALT 35 12/30/2011 1410   ALKPHOS 296* 12/30/2011 1410   BILITOT 1.0 12/30/2011 1410   GFRNONAA 37* 12/30/2011 1410   GFRAA 43* 12/30/2011 1410     Intake/Output Summary (Last 24 hours) at 12/31/11 1057 Last data filed at 12/31/11 0700  Gross per 24 hour  Intake     630 ml  Output    350 ml  Net    280 ml     Diet Order: General  Supplements/Tube Feeding: none  IVF:    sodium chloride Last Rate: 75 mL/hr at 12/31/11 0156    Estimated Nutritional Needs:   Kcal:1500-1750 Protein: 60-70 gm Fluid: 1.5 - 1.8 L  Patient has 8.9% weight loss since October, about 4 months. Per patient diet recall, patient intake has been greatly reduced compared with usual intake, eating only 2 of 3 meals and reduced amounts of food due to poor appetite, and recent vomiting. Patient reports increased weight loss in the past week with N/V/D. Patient also appears thin and frail with dry skin and lips. Patient meets criteria for moderate malnutrition in the context of chronic illness 2/2 to the above characteristics.  Patient is agreeable to Ensure supplements.  NUTRITION DIAGNOSIS: -Inadequate oral intake (NI-2.1).  Status: Ongoing  RELATED TO: decreased appetite, dry mouth, and N/V/D  AS EVIDENCE BY: weight loss, 8.9% in 4 months  MONITORING/EVALUATION(Goals): Goal: Po intake of meals and supplements will meet >90% of estimated  nutrition needs Monitor: PO intake, weight, labs, I/O's  EDUCATION NEEDS: -No education needs identified at this time  INTERVENTION: 1. Ensure Clinical Strength BID between meals, 1000 and 1400 2. RD will follow  Dietitian [phone]  DOCUMENTATION CODES Per approved criteria  -Non-severe (moderate) malnutrition in the context of chronic illness    KOWALSKI, Ilani Otterson MARIE 12/31/2011, 10:53 AM

## 2011-12-31 NOTE — Progress Notes (Signed)
*  PRELIMINARY RESULTS* Echocardiogram 2D Echocardiogram has been performed.  Glean Salen [facility] 12/31/2011, 9:38 AM

## 2011-12-31 NOTE — Evaluation (Signed)
Physical Therapy Evaluation Patient Details Name: Donna Tapia MRN: [id number] DOB: April 18, 1951 Today's Date: 12/31/2011  Problem List:  Patient Active Problem List  Diagnoses  . Dehydration  . Renal failure, acute  . Uveitis, anterior  . Nausea & vomiting    Past Medical History:  Past Medical History  Diagnosis Date  . Hepatitis B    Past Surgical History:  Past Surgical History  Procedure Date  . 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  Pt with anterior uveiitis.  Pt fatigues quickly.  Can benefit from skilled PT to maximize I and safety so pt can return home alone. PT Recommendation/Assessment: Patient will need skilled PT in the acute care venue PT Problem List: Decreased strength;Decreased activity tolerance;Decreased balance;Decreased mobility Barriers to Discharge: Decreased caregiver support PT Therapy Diagnosis : Difficulty walking;Generalized weakness PT Plan PT Frequency: Min 3X/week PT Treatment/Interventions: Gait training;DME instruction;Functional mobility training;Therapeutic activities;Therapeutic exercise;Balance training;Patient/family education PT Recommendation Follow Up Recommendations: Home health PT Equipment Recommended: None recommended by PT PT Goals  Acute Rehab PT Goals PT Goal Formulation: With patient Time For Goal Achievement: 7 days Pt will go Supine/Side to Sit: with modified independence PT Goal: Supine/Side to Sit - Progress: Goal set today Pt will go Sit to Supine/Side: with modified independence PT Goal: Sit to Supine/Side - Progress: Goal set today Pt will go Sit to Stand: with modified independence PT Goal: Sit to Stand - Progress: Goal set today Pt will go Stand to Sit: with modified independence PT Goal: Stand to Sit - Progress: Goal set today Pt will Ambulate: 51 - 150 feet;with modified independence;with least  restrictive assistive device PT Goal: Ambulate - Progress: Goal set today  PT Evaluation Precautions/Restrictions  Precautions Precautions: Fall Prior Functioning  Home Living Lives With: Alone (Had been caring for mother but mother now in SNF.) Type of Home: House Home Layout: One level Home Access: Level entry Prior Function Level of Independence: Independent with basic ADLs;Independent with transfers;Independent with homemaking with ambulation;Independent with gait Cognition Cognition Arousal/Alertness: Awake/alert Overall Cognitive Status: Appears within functional limits for tasks assessed Sensation/Coordination   Extremity Assessment RLE Strength RLE Overall Strength Comments: grossly 4/5 LLE Strength LLE Overall Strength Comments: grossly 4/5 Mobility (including Balance) Bed Mobility Bed Mobility: Yes Rolling Left: 4: Min assist Rolling Left Details (indicate cue type and reason): assist due to back pain Left Sidelying to Sit: 4: Min assist;HOB elevated (comment degrees) (HOB at 30 degrees) Sitting - Scoot to Edge of Bed: 5: Supervision Sit to Supine: 6: Modified independent (Device/Increase time) Transfers Transfers: Yes Sit to Stand: 4: Min assist;With upper extremity assist;From bed;From chair/3-in-1;With armrests Stand to Sit: 4: Min assist;With upper extremity assist;With armrests;To bed;To chair/3-in-1 Ambulation/Gait Ambulation/Gait: Yes Ambulation/Gait Assistance: 4: Min assist Ambulation Distance (Feet): 40 Feet Assistive device: None Gait Pattern:  (narrow base)  Static Standing Balance Static Standing - Balance Support: No upper extremity supported Static Standing - Level of Assistance: 5: Stand by assistance Dynamic Standing Balance Dynamic Standing - Balance Support: No upper extremity supported Dynamic Standing - Level of Assistance: 4: Min assist Exercise    End of Session PT - End of Session Activity Tolerance: Patient limited by  fatigue Patient left: in bed;with call bell in reach;with bed alarm set Nurse Communication: Mobility status for ambulation General Behavior During Session: [facility] for tasks performed Cognition: [facility] for tasks performed  [facility] 12/31/2011, 3:41 PM  [employer]  Quanetta Truss PT [phone] [facility] PT [phone]

## 2011-12-31 NOTE — Progress Notes (Signed)
VASCULAR LAB PRELIMINARY  PRELIMINARY  PRELIMINARY  PRELIMINARY  Bilateral lower extremity venous duplex  completed.    Preliminary report:  Bilateral:  No evidence of DVT, superficial thrombosis, or Baker's Cyst.   Terance Hart, RVT 12/31/2011, 10:05 AM

## 2012-01-01 ENCOUNTER — Inpatient Hospital Stay (HOSPITAL_COMMUNITY): Payer: Medicaid Other

## 2012-01-01 DIAGNOSIS — H2000 Unspecified acute and subacute iridocyclitis: Secondary | ICD-10-CM

## 2012-01-01 DIAGNOSIS — L0291 Cutaneous abscess, unspecified: Secondary | ICD-10-CM

## 2012-01-01 DIAGNOSIS — J13 Pneumonia due to Streptococcus pneumoniae: Secondary | ICD-10-CM

## 2012-01-01 DIAGNOSIS — J181 Lobar pneumonia, unspecified organism: Secondary | ICD-10-CM

## 2012-01-01 DIAGNOSIS — L0390 Cellulitis, unspecified: Secondary | ICD-10-CM

## 2012-01-01 LAB — BASIC METABOLIC PANEL
CO2: 24 mEq/L (ref 19–32)
Chloride: 103 mEq/L (ref 96–112)
Creatinine, Ser: 0.67 mg/dL (ref 0.50–1.10)
Potassium: 3.1 mEq/L — ABNORMAL LOW (ref 3.5–5.1)

## 2012-01-01 LAB — CBC
MCV: 92.4 fL (ref 78.0–100.0)
Platelets: 380 10*3/uL (ref 150–400)
RBC: 2.5 MIL/uL — ABNORMAL LOW (ref 3.87–5.11)
WBC: 16.9 10*3/uL — ABNORMAL HIGH (ref 4.0–10.5)

## 2012-01-01 LAB — URINE CULTURE: Culture  Setup Time: 201303010837

## 2012-01-01 LAB — RPR: RPR Ser Ql: NONREACTIVE

## 2012-01-01 MED ORDER — BACITRACIN-POLYMYXIN B 500-10000 UNIT/GM OP OINT
TOPICAL_OINTMENT | Freq: Two times a day (BID) | OPHTHALMIC | Status: DC
  Administered 2012-01-01 – 2012-01-02 (×2): via OPHTHALMIC
  Filled 2012-01-01: qty 3.5

## 2012-01-01 MED ORDER — POTASSIUM CHLORIDE CRYS ER 20 MEQ PO TBCR
40.0000 meq | EXTENDED_RELEASE_TABLET | Freq: Once | ORAL | Status: AC
  Administered 2012-01-01: 40 meq via ORAL
  Filled 2012-01-01: qty 2

## 2012-01-01 NOTE — Progress Notes (Addendum)
Subjective: Loose bowel movements   Antibiotics:  Anti-infectives     Start     Dose/Rate Route Frequency Ordered Stop   12/31/11 1530   valACYclovir (VALTREX) tablet 2,000 mg        2,000 mg Oral 2 times daily 12/31/11 1358 12/31/11 2235   12/31/11 0100   moxifloxacin (AVELOX) IVPB 400 mg        400 mg 250 mL/hr over 60 Minutes Intravenous Every 24 hours 12/31/11 0040            Medications: Scheduled Meds:   . feeding supplement  237 mL Oral BID  . moxifloxacin  400 mg Intravenous Q24H  . potassium chloride  40 mEq Oral Once  . prednisoLONE acetate  1 drop Left Eye Q1H while awake  . scopolamine  1 drop Left Eye BID  . sodium chloride  3 mL Intravenous Q12H  . valACYclovir  2,000 mg Oral BID  . DISCONTD: acyclovir ointment   Topical Q3H   Continuous Infusions:   . sodium chloride 75 mL (01/01/12 1024)   PRN Meds:.acetaminophen, acetaminophen, fentaNYL, HYDROcodone-acetaminophen, iohexol, ondansetron (ZOFRAN) IV, ondansetron   Objective: Weight change: -1 lb 5.2 oz (-0.6 kg)  Intake/Output Summary (Last 24 hours) at 01/01/12 1527 Last data filed at 01/01/12 0600  Gross per 24 hour  Intake   2175 ml  Output   1050 ml  Net   1125 ml   Blood pressure 120/60, pulse 76, temperature 98.4 F (36.9 C), temperature source Oral, resp. rate 18, height 5\' 9"  (1.753 m), weight 131 lb 9.8 oz (59.7 kg), SpO2 95.00%. Temp:  [98.4 F (36.9 C)-101.8 F (38.8 C)] 98.4 F (36.9 C) (03/02 1500) Pulse Rate:  [74-91] 76  (03/02 1500) Resp:  [18] 18  (03/02 1500) BP: (120-127)/(60-64) 120/60 mmHg (03/02 1500) SpO2:  [94 %-95 %] 95 % (03/02 1500) Weight:  [131 lb 9.8 oz (59.7 kg)] 131 lb 9.8 oz (59.7 kg) (03/02 0529)  Physical Exam: General: Alert and awake, oriented x3, not in any acute distress. HEENT: anicteric sclera, pupils reactive to light and accommodation, EOMI CVS regular rate, normal r,  no murmur rubs or gallops Chest: clear to auscultation bilaterally, no  wheezing, rales or rhonchi Abdomen: soft nontender, nondistended, normal bowel sounds, Extremities: no  clubbing or edema noted bilaterally Skin: no rashes Lymph: no new lymphadenopathy Neuro: nonfocal  Lab Results:  Basename 01/01/12 0600 12/31/11 1155  WBC 16.9* 16.4*  HGB 7.7* 7.7*  HCT 23.1* 23.2*  PLT 380 381    BMET  Basename 01/01/12 0600 12/31/11 1155  NA 136 134*  K 3.1* 3.2*  CL 103 100  CO2 24 23  GLUCOSE 98 105*  BUN 15 25*  CREATININE 0.67 0.82  CALCIUM 7.9* 7.8*    Micro Results: Recent Results (from the past 240 hour(s))  URINE CULTURE     Status: Normal   Collection Time   12/31/11  4:15 AM      Component Value Range Status Comment   Specimen Description URINE, CLEAN CATCH   Final    Special Requests NONE   Final    Culture  Setup Time [id number]   Final    Colony Count NO GROWTH   Final    Culture NO GROWTH   Final    Report Status 01/01/2012 FINAL   Final   CULTURE, BLOOD (ROUTINE X 2)     Status: Normal (Preliminary result)   Collection Time   12/31/11  8:42 AM  Component Value Range Status Comment   Specimen Description BLOOD LEFT ARM   Final    Special Requests BOTTLES DRAWN AEROBIC ONLY 10CC   Final    Culture  Setup Time [id number]   Final    Culture     Final    Value:        BLOOD CULTURE RECEIVED NO GROWTH TO DATE CULTURE WILL BE HELD FOR 5 DAYS BEFORE ISSUING A FINAL NEGATIVE REPORT   Report Status PENDING   Incomplete   CULTURE, BLOOD (ROUTINE X 2)     Status: Normal (Preliminary result)   Collection Time   12/31/11  8:48 AM      Component Value Range Status Comment   Specimen Description BLOOD LEFT HAND   Final    Special Requests BOTTLES DRAWN AEROBIC ONLY 10CC   Final    Culture  Setup Time [id number]   Final    Culture     Final    Value:        BLOOD CULTURE RECEIVED NO GROWTH TO DATE CULTURE WILL BE HELD FOR 5 DAYS BEFORE ISSUING A FINAL NEGATIVE REPORT   Report Status PENDING   Incomplete   CULTURE, SPUTUM-ASSESSMENT      Status: Normal   Collection Time   12/31/11  2:15 PM      Component Value Range Status Comment   Specimen Description SPUTUM   Final    Special Requests NONE   Final    Sputum evaluation     Final    Value: THIS SPECIMEN IS ACCEPTABLE. RESPIRATORY CULTURE REPORT TO FOLLOW.   Report Status 12/31/2011 FINAL   Final   CULTURE, RESPIRATORY     Status: Normal (Preliminary result)   Collection Time   12/31/11  2:15 PM      Component Value Range Status Comment   Specimen Description SPUTUM   Final    Special Requests NONE   Final    Gram Stain     Final    Value: NO WBC SEEN     NO SQUAMOUS EPITHELIAL CELLS SEEN     FEW YEAST   Culture PENDING   Incomplete    Report Status PENDING   Incomplete     Studies/Results: Dg Chest 2 View  12/30/2011  *RADIOLOGY REPORT*  Clinical Data: Weakness, nausea, vomiting and diarrhea.  CHEST - 2 VIEW  Comparison: None.  Findings: The lungs are well-aerated.  There is dense airspace consolidation involving much of the left upper lobe, with diffuse air bronchograms and mild left apical sparing.  This is compatible with significant focal pneumonia.  There also appears to be a 5.2 x 3.9 x 3.7 cm mass posteriorly near the left hilum.  This raises concern for malignancy, though the position of the mass suggests against associated postobstructive pneumonia.  In addition, there is question of a 1.8 cm nodule at the right midlung zone, with adjacent smaller airspace opacities, and a possible 1.9 cm nodule at the left midlung zone.  There is no evidence of pneumothorax.  The heart is normal in size; the mediastinal contour is within normal limits.  No acute osseous abnormalities are seen.  IMPRESSION:  1.  Dense left upper lobe pneumonia noted, with associated air bronchograms. 2.  Apparent 5.2 x 3.9 x 3.7 cm mass posteriorly near the left hilum, raising concern for malignancy; the position of the mass suggests against associated postobstructive pneumonia. 3.  Question of 1.9  cm nodule at the left midlung zone,  and a 1.8 cm nodule at the right midlung zone, with adjacent smaller airspace opacities at the right mid lung.  These results were called by telephone on 12/30/2011  at  09:45 p.m. to  Dr. Quita Skye, who verbally acknowledged these results.  Original Report Authenticated By: Tonia Ghent, M.D.   Dg Sacrum/coccyx  12/30/2011  *RADIOLOGY REPORT*  Clinical Data: Lower back pain and chronic sacrococcygeal pain.  SACRUM AND COCCYX - 2+ VIEW  Comparison: None.  Findings: The frontal views are suboptimal due to overlying contrast and bowel gas.  There is no evidence of fracture or dislocation along the sacrum or coccyx on the lateral view.  Mild degenerative change is noted at the upper lumbar spine, with associated endplate sclerosis.  Mild facet disease is noted at the lower lumbar spine.  Residual contrast is noted within the large bowel.  Clips are seen overlying the right hemipelvis.  IMPRESSION: No evidence of fracture or dislocation along the sacrum or coccyx; evaluation suboptimal due to overlying structures.  Original Report Authenticated By: Tonia Ghent, M.D.   Ct Chest W Contrast  12/31/2011  *RADIOLOGY REPORT*  Clinical Data: Cough.  Left lung mass.  CT CHEST WITH CONTRAST  Technique:  Multidetector CT imaging of the chest was performed following the standard protocol during bolus administration of intravenous contrast.  Contrast: 70mL OMNIPAQUE IOHEXOL 300 MG/ML IJ SOLN  Comparison: Abdominal CT and chest radiographs 12/30/2011.  Findings: There is dense air space disease with air bronchograms throughout the left upper lobe.  There is mild associated volume loss.  There are patchy airspace opacities within the superior segment of the left lower lobe, the right upper lobe and the right middle lobe.  There is a focal subpleural nodular density posteriorly in the left lower lobe on image 41, measuring 2.9 x 1.3 cm.  No enlarged mediastinal or hilar lymph nodes are  identified.  There is no endobronchial lesion.  A small pleural effusion is present on the left.  There is no significant right pleural effusion or pericardial effusion. Although not specifically performed to evaluate for pulmonary embolism, the pulmonary arteries appear satisfactorily opacified and there is no evidence for that on this examination.  The visualized upper abdomen appears unremarkable.  There are no suspicious osseous findings.  IMPRESSION:  1.  Dense left upper lobe air space disease with air bronchograms and scattered air space opacities throughout the additional lobes as described most consistent with multilobar pneumonia. 2.  There is a focal subpleural nodular density in the left lower lobe which is nonspecific.  This could reflect an area of inflammation or potentially a pulmonary infarct.  There is no specific evidence of pulmonary embolism on this non dedicated examination. 3.  No endobronchial lesion or lymphadenopathy. 4.  Small left pleural effusion.  Clinical correlation and close radiographic follow-up following appropriate treatment for pneumonia are necessary to exclude neoplasm.  Original Report Authenticated By: Gerrianne Scale, M.D.   Ct Abdomen Pelvis W Contrast  12/30/2011  *RADIOLOGY REPORT*  Clinical Data: Emesis and back pain.  Leukocytosis.  Calculated GFR is 37.  CT ABDOMEN AND PELVIS WITHOUT CONTRAST  Technique:  Multidetector CT imaging of the abdomen and pelvis was performed following the standard protocol without intravenous contrast.  Comparison: None.  Findings: The CT topogram raises concern for densities in the left hilar region.  This area was not imaged on the cross-sectional imaging.  The lung bases demonstrate air bronchograms and consolidation at the lingula.  There are  a few patchy parenchymal densities in the posterior left lower lobe which could be related to volume loss or inflammatory changes.  There is a small amount of volume loss at the base of the  right middle lobe. No evidence of free air.  Normal appearance of liver with a focal fat near the falciform ligament.  The portal venous system is patent.  Gallbladder has a normal appearance.  Normal appearance of the spleen, pancreas and adrenal tissue.  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  No evidence for bowel dilatation.  There is a surgical clip along the cecum suggestive for an appendectomy.  The uterus appears to be surgically absent.  No significant free fluid or lymphadenopathy in the pelvis.  Scoliosis of the lumbar spine. Scoliosis apex is at L3 with severe degenerative changes at L2-L3 and L3-L4.   There is mild heterogeneity along the anterior aspect of the right kidney on sequence two, image 45.  This area is grossly normal on the delayed images and pyelonephritis is thought to be unlikely.  IMPRESSION: Consolidation at the base of the lingula and the CT topogram raises concern for disease in the left hilum and left upper lung.  Few patchy parenchymal densities in the left lower lobe.  The findings raise concern for an inflammatory or infectious etiology in the left lung.  Recommend dedicated chest imaging for further characterization.  No acute abnormalities within the abdomen or pelvis.  The right kidney is slightly malrotated but no evidence for hydronephrosis.  Scoliosis with degenerative changes.  Original Report Authenticated By: Richarda Overlie, M.D.   Ct Maxillofacial Wo Cm  01/01/2012  *RADIOLOGY REPORT*  Clinical Data: Left eye swollen with drainage.  Headache.  CT MAXILLOFACIAL WITHOUT CONTRAST  Technique:  Multidetector CT imaging of the maxillofacial structures was performed. Multiplanar CT image reconstructions were also generated.  Comparison: None.  Findings: Soft tissue swelling of the left eye lid.  The globe is normal.  No edema or abscess is present in the orbit.  Findings are most compatible with superficial cellulitis.  Frontal sinuses are hypoplastic.   Paranasal sinuses are   clear without evidence of sinusitis.  No acute bony changes.  IMPRESSION: Superficial cellulitis of the eye lid on the left.  Negative for orbital cellulitis or abscess.  Original Report Authenticated By: Camelia Phenes, M.D.      Assessment/Plan: Donna Tapia is a 61 y.o. female with  Sudden onset anterior uveitis ? Cause, also with multilobar pneumonia and back pain now with loose bowel movements  1) Uveitis: Ophthalmology (dr. Randon Goldsmith) following. ID workup negative so far with HIV neg, RPR Toxo ab negative. pPD negative. ACE is normal, ? This an autoimmune process --followup on further ab testing --defer management to ophtho  2) Multilobar PNeumonia?: on avelox and stable , worried about loose bowel movements  3) Loose bowel movements: Could be medicine induced but pt is high risk for C diff --check c diff pcr   LOS: 2 days   Donna Tapia 01/01/2012, 3:27 PM  ADDENDUM: DR. Randon Goldsmith CALLED ME THIS AFTERNOON AND WAS CONCERNED BY DRAMATIC AND FULMINANT PROGRESSIVE CHANGES INT PTS EXAM Thursday Compared to today. 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 He had noted the patients shoulder is now inflamed and exquisitedly tender to palpation.  We therefore started the patient on high dose vancomycin and IV cefepime.   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   I called Dr. Clarisa Kindred who was aware of the pt and planning on doing surgery tomorrow. She is coming to see the pt later tonight as well.

## 2012-01-01 NOTE — Progress Notes (Signed)
Read PPD at 0845 and there was no induration.  Peter Congo RN

## 2012-01-01 NOTE — Progress Notes (Signed)
Subjective: Patient seen  and examine this am. Feels her vision to have unimproved . Back pain better with meds and c/o nasal discharge and some frontal heaache  Objective:  Vital signs in last 24 hours:  Filed Vitals:   12/31/11 2155 12/31/11 2245 01/01/12 0529 01/01/12 1500  BP: 127/60  121/64 120/60  Pulse: 91  74 76  Temp: 101.8 F (38.8 C) 100.5 F (38.1 C) 98.5 F (36.9 C) 98.4 F (36.9 C)  TempSrc: Oral  Oral Oral  Resp: 18  18 18   Height:      Weight:   59.7 kg (131 lb 9.8 oz)   SpO2: 95%  94% 95%    Intake/Output from previous day:   Intake/Output Summary (Last 24 hours) at 01/01/12 1550 Last data filed at 01/01/12 0600  Gross per 24 hour  Intake   1935 ml  Output    600 ml  Net   1335 ml    Physical Exam:   General: elderly thin built female in NAD  HEENT: constricted left pupil With no pallor, no icterus, moist oral mucosa, no JVD, no lymphadenopathy. Herpetic lesions over rt lower lip  Heart: Normal s1 &s2 Regular rate and rhythm, without murmurs, rubs, gallops.  Lungs:coarse crackles over left side  Abdomen: Soft, nontender, nondistended, positive bowel sounds.  Extremities: No clubbing cyanosis or edema with positive pedal pulses.  Neuro: Alert, awake, oriented x3, nonfocal.   Lab Results:  Basic Metabolic Panel:    Component Value Date/Time   NA 136 01/01/2012 0600   K 3.1* 01/01/2012 0600   CL 103 01/01/2012 0600   CO2 24 01/01/2012 0600   BUN 15 01/01/2012 0600   CREATININE 0.67 01/01/2012 0600   GLUCOSE 98 01/01/2012 0600   CALCIUM 7.9* 01/01/2012 0600   CBC:    Component Value Date/Time   WBC 16.9* 01/01/2012 0600   HGB 7.7* 01/01/2012 0600   HCT 23.1* 01/01/2012 0600   PLT 380 01/01/2012 0600   MCV 92.4 01/01/2012 0600   NEUTROABS 15.1* 12/31/2011 1155   LYMPHSABS 0.8 12/31/2011 1155   MONOABS 0.5 12/31/2011 1155   EOSABS 0.0 12/31/2011 1155   BASOSABS 0.0 12/31/2011 1155    Recent Results (from the past 240 hour(s))  URINE CULTURE     Status: Normal    Collection Time   12/31/11  4:15 AM      Component Value Range Status Comment   Specimen Description URINE, CLEAN CATCH   Final    Special Requests NONE   Final    Culture  Setup Time [id number]   Final    Colony Count NO GROWTH   Final    Culture NO GROWTH   Final    Report Status 01/01/2012 FINAL   Final   CULTURE, BLOOD (ROUTINE X 2)     Status: Normal (Preliminary result)   Collection Time   12/31/11  8:42 AM      Component Value Range Status Comment   Specimen Description BLOOD LEFT ARM   Final    Special Requests BOTTLES DRAWN AEROBIC ONLY 10CC   Final    Culture  Setup Time [id number]   Final    Culture     Final    Value:        BLOOD CULTURE RECEIVED NO GROWTH TO DATE CULTURE WILL BE HELD FOR 5 DAYS BEFORE ISSUING A FINAL NEGATIVE REPORT   Report Status PENDING   Incomplete   CULTURE, BLOOD (ROUTINE X 2)  Status: Normal (Preliminary result)   Collection Time   12/31/11  8:48 AM      Component Value Range Status Comment   Specimen Description BLOOD LEFT HAND   Final    Special Requests BOTTLES DRAWN AEROBIC ONLY 10CC   Final    Culture  Setup Time [id number]   Final    Culture     Final    Value:        BLOOD CULTURE RECEIVED NO GROWTH TO DATE CULTURE WILL BE HELD FOR 5 DAYS BEFORE ISSUING A FINAL NEGATIVE REPORT   Report Status PENDING   Incomplete   CULTURE, SPUTUM-ASSESSMENT     Status: Normal   Collection Time   12/31/11  2:15 PM      Component Value Range Status Comment   Specimen Description SPUTUM   Final    Special Requests NONE   Final    Sputum evaluation     Final    Value: THIS SPECIMEN IS ACCEPTABLE. RESPIRATORY CULTURE REPORT TO FOLLOW.   Report Status 12/31/2011 FINAL   Final   CULTURE, RESPIRATORY     Status: Normal (Preliminary result)   Collection Time   12/31/11  2:15 PM      Component Value Range Status Comment   Specimen Description SPUTUM   Final    Special Requests NONE   Final    Gram Stain     Final    Value: NO WBC SEEN     NO SQUAMOUS  EPITHELIAL CELLS SEEN     FEW YEAST   Culture PENDING   Incomplete    Report Status PENDING   Incomplete     Studies/Results: Dg Chest 2 View  12/30/2011  *RADIOLOGY REPORT*  Clinical Data: Weakness, nausea, vomiting and diarrhea.  CHEST - 2 VIEW  Comparison: None.  Findings: The lungs are well-aerated.  There is dense airspace consolidation involving much of the left upper lobe, with diffuse air bronchograms and mild left apical sparing.  This is compatible with significant focal pneumonia.  There also appears to be a 5.2 x 3.9 x 3.7 cm mass posteriorly near the left hilum.  This raises concern for malignancy, though the position of the mass suggests against associated postobstructive pneumonia.  In addition, there is question of a 1.8 cm nodule at the right midlung zone, with adjacent smaller airspace opacities, and a possible 1.9 cm nodule at the left midlung zone.  There is no evidence of pneumothorax.  The heart is normal in size; the mediastinal contour is within normal limits.  No acute osseous abnormalities are seen.  IMPRESSION:  1.  Dense left upper lobe pneumonia noted, with associated air bronchograms. 2.  Apparent 5.2 x 3.9 x 3.7 cm mass posteriorly near the left hilum, raising concern for malignancy; the position of the mass suggests against associated postobstructive pneumonia. 3.  Question of 1.9 cm nodule at the left midlung zone, and a 1.8 cm nodule at the right midlung zone, with adjacent smaller airspace opacities at the right mid lung.  These results were called by telephone on 12/30/2011  at  09:45 p.m. to  Dr. Quita Skye, who verbally acknowledged these results.  Original Report Authenticated By: Tonia Ghent, M.D.   Dg Sacrum/coccyx  12/30/2011  *RADIOLOGY REPORT*  Clinical Data: Lower back pain and chronic sacrococcygeal pain.  SACRUM AND COCCYX - 2+ VIEW  Comparison: None.  Findings: The frontal views are suboptimal due to overlying contrast and bowel gas.  There is no  evidence of fracture or dislocation along the sacrum or coccyx on the lateral view.  Mild degenerative change is noted at the upper lumbar spine, with associated endplate sclerosis.  Mild facet disease is noted at the lower lumbar spine.  Residual contrast is noted within the large bowel.  Clips are seen overlying the right hemipelvis.  IMPRESSION: No evidence of fracture or dislocation along the sacrum or coccyx; evaluation suboptimal due to overlying structures.  Original Report Authenticated By: Tonia Ghent, M.D.   Ct Chest W Contrast  12/31/2011  *RADIOLOGY REPORT*  Clinical Data: Cough.  Left lung mass.  CT CHEST WITH CONTRAST  Technique:  Multidetector CT imaging of the chest was performed following the standard protocol during bolus administration of intravenous contrast.  Contrast: 70mL OMNIPAQUE IOHEXOL 300 MG/ML IJ SOLN  Comparison: Abdominal CT and chest radiographs 12/30/2011.  Findings: There is dense air space disease with air bronchograms throughout the left upper lobe.  There is mild associated volume loss.  There are patchy airspace opacities within the superior segment of the left lower lobe, the right upper lobe and the right middle lobe.  There is a focal subpleural nodular density posteriorly in the left lower lobe on image 41, measuring 2.9 x 1.3 cm.  No enlarged mediastinal or hilar lymph nodes are identified.  There is no endobronchial lesion.  A small pleural effusion is present on the left.  There is no significant right pleural effusion or pericardial effusion. Although not specifically performed to evaluate for pulmonary embolism, the pulmonary arteries appear satisfactorily opacified and there is no evidence for that on this examination.  The visualized upper abdomen appears unremarkable.  There are no suspicious osseous findings.  IMPRESSION:  1.  Dense left upper lobe air space disease with air bronchograms and scattered air space opacities throughout the additional lobes as  described most consistent with multilobar pneumonia. 2.  There is a focal subpleural nodular density in the left lower lobe which is nonspecific.  This could reflect an area of inflammation or potentially a pulmonary infarct.  There is no specific evidence of pulmonary embolism on this non dedicated examination. 3.  No endobronchial lesion or lymphadenopathy. 4.  Small left pleural effusion.  Clinical correlation and close radiographic follow-up following appropriate treatment for pneumonia are necessary to exclude neoplasm.  Original Report Authenticated By: Gerrianne Scale, M.D.   Ct Abdomen Pelvis W Contrast  12/30/2011  *RADIOLOGY REPORT*  Clinical Data: Emesis and back pain.  Leukocytosis.  Calculated GFR is 37.  CT ABDOMEN AND PELVIS WITHOUT CONTRAST  Technique:  Multidetector CT imaging of the abdomen and pelvis was performed following the standard protocol without intravenous contrast.  Comparison: None.  Findings: The CT topogram raises concern for densities in the left hilar region.  This area was not imaged on the cross-sectional imaging.  The lung bases demonstrate air bronchograms and consolidation at the lingula.  There are a few patchy parenchymal densities in the posterior left lower lobe which could be related to volume loss or inflammatory changes.  There is a small amount of volume loss at the base of the right middle lobe. No evidence of free air.  Normal appearance of liver with a focal fat near the falciform ligament.  The portal venous system is patent.  Gallbladder has a normal appearance.  Normal appearance of the spleen, pancreas and adrenal tissue.  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  No evidence for bowel dilatation.  There is a surgical clip  along the cecum suggestive for an appendectomy.  The uterus appears to be surgically absent.  No significant free fluid or lymphadenopathy in the pelvis.  Scoliosis of the lumbar spine. Scoliosis apex is at L3 with  severe degenerative changes at L2-L3 and L3-L4.   There is mild heterogeneity along the anterior aspect of the right kidney on sequence two, image 45.  This area is grossly normal on the delayed images and pyelonephritis is thought to be unlikely.  IMPRESSION: Consolidation at the base of the lingula and the CT topogram raises concern for disease in the left hilum and left upper lung.  Few patchy parenchymal densities in the left lower lobe.  The findings raise concern for an inflammatory or infectious etiology in the left lung.  Recommend dedicated chest imaging for further characterization.  No acute abnormalities within the abdomen or pelvis.  The right kidney is slightly malrotated but no evidence for hydronephrosis.  Scoliosis with degenerative changes.  Original Report Authenticated By: Richarda Overlie, M.D.   Ct Maxillofacial Wo Cm  01/01/2012  *RADIOLOGY REPORT*  Clinical Data: Left eye swollen with drainage.  Headache.  CT MAXILLOFACIAL WITHOUT CONTRAST  Technique:  Multidetector CT imaging of the maxillofacial structures was performed. Multiplanar CT image reconstructions were also generated.  Comparison: None.  Findings: Soft tissue swelling of the left eye lid.  The globe is normal.  No edema or abscess is present in the orbit.  Findings are most compatible with superficial cellulitis.  Frontal sinuses are hypoplastic.  Paranasal sinuses are   clear without evidence of sinusitis.  No acute bony changes.  IMPRESSION: Superficial cellulitis of the eye lid on the left.  Negative for orbital cellulitis or abscess.  Original Report Authenticated By: Camelia Phenes, M.D.    Medications: Scheduled Meds:   . feeding supplement  237 mL Oral BID  . moxifloxacin  400 mg Intravenous Q24H  . potassium chloride  40 mEq Oral Once  . prednisoLONE acetate  1 drop Left Eye Q1H while awake  . scopolamine  1 drop Left Eye BID  . sodium chloride  3 mL Intravenous Q12H  . valACYclovir  2,000 mg Oral BID  . DISCONTD:  acyclovir ointment   Topical Q3H   Continuous Infusions:   . sodium chloride 75 mL (01/01/12 1024)   PRN Meds:.acetaminophen, acetaminophen, fentaNYL, HYDROcodone-acetaminophen, iohexol, ondansetron (ZOFRAN) IV, ondansetron  Assessment:  55 female with chr low back pain and hx of hep B presents with 2 days hx of painless left eye blindness with hx of nausea. Vomiting an diarrhea 1 week back with findings of left anterior uveitis and multilobar left sided  pneumonia on CT scan.   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  possible etiology autoimmune  Autoimmune w/up including HLA ag, hepatitis panel pending  Cont prednisone drops and scopolamine  Xray SI jt unremarkable  Elevated alk P noted, CMV PCR, toxoplasma ag negative, HIV, bartonella ordered  ACE level normal  Discussed with Dr Domingo Pulse from rheum , agrees to follow up in clinic after discharge if autoimmune w/up positive ( she does not have insurance so this might not be possible)   Left sided multilobar PNA  Started on avelox  And will continue ( day 2)  Will get sputum cx  Swallow eval to r/o aspiration Patient had  temp spike past 24 hrs Cultures pending   ? Sinusitis CT sinuses done negative  there is superficial cellulitis over the eye lid could  be related to uveitis. 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 ID recommends ruling out for c diff  DVT prophylaxis: SCD   Full code      LOS: 2 days   Garrick Midgley 01/01/2012, 3:50 PM

## 2012-01-01 NOTE — Progress Notes (Signed)
Subjective:  Pulmonary Consult f/u HPI-History of Present Illness: 61 yo WF , never smoker, reports 2 weeks of nasal drainage, coughing with brown sputum, fever and increased weakness. She woke up 2/28 blind in her left eye and went to [facility] [place] and dx of anterior uveitis rendered by Ophthalmology. It was recommended that systemic causes of uveitis be excluded. A CXR was performed revealing finding CW pneumonia and Pulm Med is asked to evaluate further. 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   Today- No acute changes in eyesight or breathing. Scant sputum last night. Notices tender L shoulder. Illness began w/ N&V without recognized aspiration. Has had persistent maxillary/ frontal sinus pressure and blew some blood from nose. No headache. C/O dry mouth. Feels weak, not short of breath at rest.  Objective: Vital signs in last 24 hours: Temp:  [98.3 F (36.8 C)-101.8 F (38.8 C)] 98.5 F (36.9 C) (03/02 0529) Pulse Rate:  [74-91] 74  (03/02 0529) Resp:  [18-20] 18  (03/02 0529) BP: (112-127)/(58-64) 121/64 mmHg (03/02 0529) SpO2:  [94 %-95 %] 94 % (03/02 0529) Weight:  [59.7 kg (131 lb 9.8 oz)] 59.7 kg (131 lb 9.8 oz) (03/02 0529)  Physical Examination:  General:  61 yo wf. Calm and oriented Neuro: Left blindness, lethargic but no overt deficits. Holds L eye closed HEENT: No jvd. Lt eye as noted. Mucosa dry, no thrush Cardiovascular: hsr rrr no murmur Lungs: Normal on R. Posterior crackles in L mid lung field. Prominent egophony anteriorly on L , no rub. Not coughing, unlabored. Abdomen: + bs  Musculoskeletal: Tender to pressure left shoulder, w/o obvious effusion Skin: viral fever blisters lower lip  Lab Results:  Basename 01/01/12 0600 12/31/11 1155  WBC 16.9* 16.4*  HGB 7.7* 7.7*  HCT 23.1* 23.2*  PLT 380 381   BMET  Basename 01/01/12 0600 12/31/11 1155  NA 136 134*  K 3.1* 3.2*  CL 103 100  CO2 24 23  GLUCOSE 98 105*  BUN 15  25*  CREATININE 0.67 0.82  CALCIUM 7.9* 7.8*    Studies/Results: Dg Chest 2 View  12/30/2011  *RADIOLOGY REPORT*  Clinical Data: Weakness, nausea, vomiting and diarrhea.  CHEST - 2 VIEW  Comparison: None.  Findings: The lungs are well-aerated.  There is dense airspace consolidation involving much of the left upper lobe, with diffuse air bronchograms and mild left apical sparing.  This is compatible with significant focal pneumonia.  There also appears to be a 5.2 x 3.9 x 3.7 cm mass posteriorly near the left hilum.  This raises concern for malignancy, though the position of the mass suggests against associated postobstructive pneumonia.  In addition, there is question of a 1.8 cm nodule at the right midlung zone, with adjacent smaller airspace opacities, and a possible 1.9 cm nodule at the left midlung zone.  There is no evidence of pneumothorax.  The heart is normal in size; the mediastinal contour is within normal limits.  No acute osseous abnormalities are seen.  IMPRESSION:  1.  Dense left upper lobe pneumonia noted, with associated air bronchograms. 2.  Apparent 5.2 x 3.9 x 3.7 cm mass posteriorly near the left hilum, raising concern for malignancy; the position of the mass suggests against associated postobstructive pneumonia. 3.  Question of 1.9 cm nodule at the left midlung zone, and a 1.8 cm nodule at the right midlung zone, with adjacent smaller airspace opacities at the right mid lung.  These results were called  by telephone on 12/30/2011  at  09:45 p.m. to  Dr. Quita Skye, who verbally acknowledged these results.  Original Report Authenticated By: Tonia Ghent, M.D.   Dg Sacrum/coccyx  12/30/2011  *RADIOLOGY REPORT*  Clinical Data: Lower back pain and chronic sacrococcygeal pain.  SACRUM AND COCCYX - 2+ VIEW  Comparison: None.  Findings: The frontal views are suboptimal due to overlying contrast and bowel gas.  There is no evidence of fracture or dislocation along the sacrum or coccyx on the  lateral view.  Mild degenerative change is noted at the upper lumbar spine, with associated endplate sclerosis.  Mild facet disease is noted at the lower lumbar spine.  Residual contrast is noted within the large bowel.  Clips are seen overlying the right hemipelvis.  IMPRESSION: No evidence of fracture or dislocation along the sacrum or coccyx; evaluation suboptimal due to overlying structures.  Original Report Authenticated By: Tonia Ghent, M.D.   Ct Chest W Contrast  12/31/2011  *RADIOLOGY REPORT*  Clinical Data: Cough.  Left lung mass.  CT CHEST WITH CONTRAST  Technique:  Multidetector CT imaging of the chest was performed following the standard protocol during bolus administration of intravenous contrast.  Contrast: 70mL OMNIPAQUE IOHEXOL 300 MG/ML IJ SOLN  Comparison: Abdominal CT and chest radiographs 12/30/2011.  Findings: There is dense air space disease with air bronchograms throughout the left upper lobe.  There is mild associated volume loss.  There are patchy airspace opacities within the superior segment of the left lower lobe, the right upper lobe and the right middle lobe.  There is a focal subpleural nodular density posteriorly in the left lower lobe on image 41, measuring 2.9 x 1.3 cm.  No enlarged mediastinal or hilar lymph nodes are identified.  There is no endobronchial lesion.  A small pleural effusion is present on the left.  There is no significant right pleural effusion or pericardial effusion. Although not specifically performed to evaluate for pulmonary embolism, the pulmonary arteries appear satisfactorily opacified and there is no evidence for that on this examination.  The visualized upper abdomen appears unremarkable.  There are no suspicious osseous findings.  IMPRESSION:  1.  Dense left upper lobe air space disease with air bronchograms and scattered air space opacities throughout the additional lobes as described most consistent with multilobar pneumonia. 2.  There is a focal  subpleural nodular density in the left lower lobe which is nonspecific.  This could reflect an area of inflammation or potentially a pulmonary infarct.  There is no specific evidence of pulmonary embolism on this non dedicated examination. 3.  No endobronchial lesion or lymphadenopathy. 4.  Small left pleural effusion.  Clinical correlation and close radiographic follow-up following appropriate treatment for pneumonia are necessary to exclude neoplasm.  Original Report Authenticated By: Gerrianne Scale, M.D.   Ct Abdomen Pelvis W Contrast  12/30/2011  *RADIOLOGY REPORT*  Clinical Data: Emesis and back pain.  Leukocytosis.  Calculated GFR is 37.  CT ABDOMEN AND PELVIS WITHOUT CONTRAST  Technique:  Multidetector CT imaging of the abdomen and pelvis was performed following the standard protocol without intravenous contrast.  Comparison: None.  Findings: The CT topogram raises concern for densities in the left hilar region.  This area was not imaged on the cross-sectional imaging.  The lung bases demonstrate air bronchograms and consolidation at the lingula.  There are a few patchy parenchymal densities in the posterior left lower lobe which could be related to volume loss or inflammatory changes.  There is  a small amount of volume loss at the base of the right middle lobe. No evidence of free air.  Normal appearance of liver with a focal fat near the falciform ligament.  The portal venous system is patent.  Gallbladder has a normal appearance.  Normal appearance of the spleen, pancreas and adrenal tissue.  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  No evidence for bowel dilatation.  There is a surgical clip along the cecum suggestive for an appendectomy.  The uterus appears to be surgically absent.  No significant free fluid or lymphadenopathy in the pelvis.  Scoliosis of the lumbar spine. Scoliosis apex is at L3 with severe degenerative changes at L2-L3 and L3-L4.   There is mild heterogeneity  along the anterior aspect of the right kidney on sequence two, image 45.  This area is grossly normal on the delayed images and pyelonephritis is thought to be unlikely.  IMPRESSION: Consolidation at the base of the lingula and the CT topogram raises concern for disease in the left hilum and left upper lung.  Few patchy parenchymal densities in the left lower lobe.  The findings raise concern for an inflammatory or infectious etiology in the left lung.  Recommend dedicated chest imaging for further characterization.  No acute abnormalities within the abdomen or pelvis.  The right kidney is slightly malrotated but no evidence for hydronephrosis.  Scoliosis with degenerative changes.  Original Report Authenticated By: Richarda Overlie, M.D.    Medications: {I have reviewed meds.  Assessment/Plan: 1) Multilobar pneumonia- Avelox day 2. I have reviewed images. She denies aspiration event but began with gastrointestinal symptoms c/w viral gastroenteritis. Now mentions sinus pressure and blood from nose. Note new c/o left shoulder pain. Recommend- Continue Avelox, leave cup for additional sputum cx,  CT sinuses  LOS: 2 days   TRUE Garciamartinez D 01/01/2012, 8:05 AM

## 2012-01-02 ENCOUNTER — Inpatient Hospital Stay (HOSPITAL_COMMUNITY): Payer: Medicaid Other

## 2012-01-02 DIAGNOSIS — L0390 Cellulitis, unspecified: Secondary | ICD-10-CM

## 2012-01-02 DIAGNOSIS — L0291 Cutaneous abscess, unspecified: Secondary | ICD-10-CM

## 2012-01-02 LAB — CBC
MCV: 91 fL (ref 78.0–100.0)
Platelets: 432 10*3/uL — ABNORMAL HIGH (ref 150–400)
RBC: 2.33 MIL/uL — ABNORMAL LOW (ref 3.87–5.11)
RDW: 14.9 % (ref 11.5–15.5)
WBC: 16.5 10*3/uL — ABNORMAL HIGH (ref 4.0–10.5)

## 2012-01-02 LAB — BASIC METABOLIC PANEL
CO2: 23 mEq/L (ref 19–32)
Calcium: 7.5 mg/dL — ABNORMAL LOW (ref 8.4–10.5)
Chloride: 100 mEq/L (ref 96–112)
Creatinine, Ser: 0.55 mg/dL (ref 0.50–1.10)
GFR calc Af Amer: >90 mL/min (ref >90)
Sodium: 132 mEq/L — ABNORMAL LOW (ref 135–145)

## 2012-01-02 LAB — CLOSTRIDIUM DIFFICILE BY PCR: Toxigenic C. Difficile by PCR: NEGATIVE

## 2012-01-02 MED ORDER — DEXTROSE 5 % IV SOLN
1.0000 g | Freq: Three times a day (TID) | INTRAVENOUS | Status: DC
  Administered 2012-01-02 – 2012-01-09 (×20): 1 g via INTRAVENOUS
  Administered 2012-01-10: 15:00:00 via INTRAVENOUS
  Administered 2012-01-10 – 2012-01-17 (×21): 1 g via INTRAVENOUS
  Filled 2012-01-02 (×49): qty 1

## 2012-01-02 MED ORDER — GATIFLOXACIN 0.5 % OP SOLN
1.0000 [drp] | OPHTHALMIC | Status: DC
  Administered 2012-01-02 – 2012-01-07 (×41): 1 [drp] via OPHTHALMIC
  Filled 2012-01-02 (×2): qty 2.5

## 2012-01-02 MED ORDER — VANCOMYCIN HCL 1000 MG IV SOLR
750.0000 mg | Freq: Two times a day (BID) | INTRAVENOUS | Status: DC
  Administered 2012-01-02: 750 mg via INTRAVENOUS
  Filled 2012-01-02 (×3): qty 750

## 2012-01-02 MED ORDER — DEXTROSE 5 % IV SOLN
2.0000 g | Freq: Three times a day (TID) | INTRAVENOUS | Status: DC
  Filled 2012-01-02: qty 2

## 2012-01-02 MED ORDER — WHITE PETROLATUM GEL
Status: AC
  Administered 2012-01-02: 1
  Filled 2012-01-02: qty 5

## 2012-01-02 MED ORDER — VANCOMYCIN HCL 1000 MG IV SOLR
750.0000 mg | Freq: Two times a day (BID) | INTRAVENOUS | Status: DC
  Administered 2012-01-03 – 2012-01-05 (×6): 750 mg via INTRAVENOUS
  Filled 2012-01-02 (×8): qty 750

## 2012-01-02 MED ORDER — GADOBENATE DIMEGLUMINE 529 MG/ML IV SOLN
13.0000 mL | Freq: Once | INTRAVENOUS | Status: AC | PRN
  Administered 2012-01-02: 13 mL via INTRAVENOUS

## 2012-01-02 MED ORDER — BACITRACIN-POLYMYXIN B 500-10000 UNIT/GM OP OINT
TOPICAL_OINTMENT | Freq: Four times a day (QID) | OPHTHALMIC | Status: DC
  Administered 2012-01-02: 22:00:00 via OPHTHALMIC
  Administered 2012-01-02: 1 via OPHTHALMIC
  Administered 2012-01-02 – 2012-01-07 (×19): via OPHTHALMIC
  Filled 2012-01-02 (×2): qty 3.5

## 2012-01-02 MED ORDER — DEXTROSE 5 % IV SOLN
2.0000 g | Freq: Three times a day (TID) | INTRAVENOUS | Status: DC
  Filled 2012-01-02 (×4): qty 2

## 2012-01-02 NOTE — Progress Notes (Signed)
Subjective:  Pulmonary Consult f/u HPI-History of Present Illness: 61 yo WF , never smoker, reports 2 weeks of nasal drainage, coughing with brown sputum, fever and increased weakness. 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 It was recommended that systemic causes of uveitis be excluded. A CXR was performed revealing finding CW pneumonia and Pulm Med is asked to evaluate further. 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   Today- Note by ID reviewed. Discussed w/ nurse. Major discomforts are soft tissue tenderness, Left shoulder, mucositis with very dry mouth not relieved by Biotene, ice chips, and low back pain which is old and intermittent with some sciatica distribution into Right leg, possibly from lying in bed. Little cough or sputum. Not severely dyspneic at rest.  Objective: Vital signs in last 24 hours: Temp:  [98.2 F (36.8 C)-98.8 F (37.1 C)] 98.2 F (36.8 C) (03/03 0523) Pulse Rate:  [76-83] 83  (03/03 0523) Resp:  [17-18] 17  (03/03 0523) BP: (120-143)/(60-63) 133/63 mmHg (03/03 0523) SpO2:  [90 %-95 %] 90 % (03/03 0523) Weight:  [62.3 kg (137 lb 5.6 oz)] 62.3 kg (137 lb 5.6 oz) (03/03 1191)  Physical Examination:  General:  61 yo wf. Calm and oriented Neuro: Left blindness, lethargic but no overt deficits. Holds L eye closed. Lid is swollen. HEENT: No jvd. Lt eye as noted. Mucosa dry, no thrush Cardiovascular: hsr rrr no murmur Lungs: Normal on R. Posterior crackles in L mid lung field. Prominent egophony anteriorly on L , no rub. Not coughing, unlabored. Abdomen: + bs  Musculoskeletal: Tender to pressure left shoulder, w/o obvious effusion. No crepitus and mobility seems normal Skin: viral fever blisters lower lip, pink over left shoulder  Lab Results:  Basename 01/01/12 0600 12/31/11 1155  WBC 16.9* 16.4*  HGB 7.7* 7.7*  HCT 23.1* 23.2*  PLT 380 381    BMET  Basename 01/01/12 0600 12/31/11 1155  NA 136 134*  K 3.1* 3.2*  CL 103 100  CO2 24 23  GLUCOSE 98 105*  BUN 15 25*  CREATININE 0.67 0.82  CALCIUM 7.9* 7.8*    Studies/Results: Ct Chest W Contrast  12/31/2011  *RADIOLOGY REPORT*  Clinical Data: Cough.  Left lung mass.  CT CHEST WITH CONTRAST  Technique:  Multidetector CT imaging of the chest was performed following the standard protocol during bolus administration of intravenous contrast.  Contrast: 70mL OMNIPAQUE IOHEXOL 300 MG/ML IJ SOLN  Comparison: Abdominal CT and chest radiographs 12/30/2011.  Findings: There is dense air space disease with air bronchograms throughout the left upper lobe.  There is mild associated volume loss.  There are patchy airspace opacities within the superior segment of the left lower lobe, the right upper lobe and the right middle lobe.  There is a focal subpleural nodular density posteriorly in the left lower lobe on image 41, measuring 2.9 x 1.3 cm.  No enlarged mediastinal or hilar lymph nodes are identified.  There is no endobronchial lesion.  A small pleural effusion is present on the left.  There is no significant right pleural effusion or pericardial effusion. Although not specifically performed to evaluate for pulmonary embolism, the pulmonary arteries appear satisfactorily opacified and there is no evidence for that on this examination.  The visualized upper abdomen appears unremarkable.  There are no suspicious osseous findings.  IMPRESSION:  1.  Dense left upper lobe air space disease  with air bronchograms and scattered air space opacities throughout the additional lobes as described most consistent with multilobar pneumonia. 2.  There is a focal subpleural nodular density in the left lower lobe which is nonspecific.  This could reflect an area of inflammation or potentially a pulmonary infarct.  There is no specific evidence of pulmonary embolism on this non dedicated examination. 3.  No endobronchial  lesion or lymphadenopathy. 4.  Small left pleural effusion.  Clinical correlation and close radiographic follow-up following appropriate treatment for pneumonia are necessary to exclude neoplasm.  Original Report Authenticated By: Gerrianne Scale, M.D.   Ct Maxillofacial Wo Cm  01/01/2012  *RADIOLOGY REPORT*  Clinical Data: Left eye swollen with drainage.  Headache.  CT MAXILLOFACIAL WITHOUT CONTRAST  Technique:  Multidetector CT imaging of the maxillofacial structures was performed. Multiplanar CT image reconstructions were also generated.  Comparison: None.  Findings: Soft tissue swelling of the left eye lid.  The globe is normal.  No edema or abscess is present in the orbit.  Findings are most compatible with superficial cellulitis.  Frontal sinuses are hypoplastic.  Paranasal sinuses are   clear without evidence of sinusitis.  No acute bony changes.  IMPRESSION: Superficial cellulitis of the eye lid on the left.  Negative for orbital cellulitis or abscess.  Original Report Authenticated By: Camelia Phenes, M.D.    Medications: {I have reviewed meds.  Assessment/Plan: 1) Multilobar pneumonia- Avelox day 2. I have reviewed images. She denies aspiration event but began with gastrointestinal symptoms c/w viral gastroenteritis. Now mentions sinus pressure and blood from nose. - Cultures neg so far. ID seeing. CXR pattern is too consolidated for usual viral pneumonia. If cultures remain neg tomorrow, then consider if bronch for additional material would be helpful.  -Recommend f/u CXR in AM, Watch need for (humidified) O2 2)Anterior uveitis.- CT sinuses neg, and read as superficial cellulitis of eyelid. It looks worse today. -Recommend- ask opth for f/u. 3) left shoulder pain- soft tissue- ? Early shingles on valacyclovir Recommend- Continue Avelox, leave cup for additional sputum cx,  CT sinuses 4) Mucositis- xerostomia. Pharmacy only has Biotene. Can try topical vaseline- ordered 5)Low back pain/  sciatica?- suspect this is separate, related to bed confinement. Pain control, heating pad, position mobilization may help.  LOS: 3 days   Tomi Grandpre D 01/02/2012, 8:31 AM

## 2012-01-02 NOTE — Progress Notes (Addendum)
Subjective: No more loose bowel movments but worsening pain in eye   Antibiotics:  Anti-infectives     Start     Dose/Rate Route Frequency Ordered Stop   12/31/11 1530   valACYclovir (VALTREX) tablet 2,000 mg        2,000 mg Oral 2 times daily 12/31/11 1358 12/31/11 2235   12/31/11 0100   moxifloxacin (AVELOX) IVPB 400 mg        400 mg 250 mL/hr over 60 Minutes Intravenous Every 24 hours 12/31/11 0040            Medications: Scheduled Meds:    . bacitracin-polymyxin b   Left Eye BID  . feeding supplement  237 mL Oral BID  . moxifloxacin  400 mg Intravenous Q24H  . prednisoLONE acetate  1 drop Left Eye Q1H while awake  . scopolamine  1 drop Left Eye BID  . sodium chloride  3 mL Intravenous Q12H  . white petrolatum       Continuous Infusions:    . sodium chloride 75 mL (01/01/12 1024)   PRN Meds:.acetaminophen, acetaminophen, fentaNYL, HYDROcodone-acetaminophen, ondansetron (ZOFRAN) IV, ondansetron   Objective: Weight change: 5 lb 11.7 oz (2.6 kg)  Intake/Output Summary (Last 24 hours) at 01/02/12 1114 Last data filed at 01/02/12 0600  Gross per 24 hour  Intake   2890 ml  Output   1025 ml  Net   1865 ml   Blood pressure 133/63, pulse 83, temperature 98.2 F (36.8 C), temperature source Oral, resp. rate 17, height 5\' 9"  (1.753 m), weight 137 lb 5.6 oz (62.3 kg), SpO2 90.00%. Temp:  [98.2 F (36.8 C)-98.8 F (37.1 C)] 98.2 F (36.8 C) (03/03 0523) Pulse Rate:  [76-83] 83  (03/03 0523) Resp:  [17-18] 17  (03/03 0523) BP: (120-143)/(60-63) 133/63 mmHg (03/03 0523) SpO2:  [90 %-95 %] 90 % (03/03 0523) Weight:  [137 lb 5.6 oz (62.3 kg)] 137 lb 5.6 oz (62.3 kg) (03/03 0523)  Physical Exam: General: Alert and awake, oriented x3, not in any acute distress. HEENT: left eye with edema or lid, intense erythema of sclera, clear drainage CVS regular rate, normal r,  no murmur rubs or gallops Chest: diminised breath sounds at bases Abdomen: soft nontender,  nondistended, normal bowel sounds, Extremities: no  clubbing or edema noted bilaterally Skin: no rashes Lymph: no new lymphadenopathy Neuro: nonfocal  Lab Results:  Basename 01/02/12 0845 01/01/12 0600  WBC 16.5* 16.9*  HGB 7.3* 7.7*  HCT 21.2* 23.1*  PLT 432* 380    BMET  Basename 01/02/12 0845 01/01/12 0600  NA 132* 136  K 3.5 3.1*  CL 100 103  CO2 23 24  GLUCOSE 96 98  BUN 9 15  CREATININE 0.55 0.67  CALCIUM 7.5* 7.9*    Micro Results: Recent Results (from the past 240 hour(s))  URINE CULTURE     Status: Normal   Collection Time   12/31/11  4:15 AM      Component Value Range Status Comment   Specimen Description URINE, CLEAN CATCH   Final    Special Requests NONE   Final    Culture  Setup Time [id number]   Final    Colony Count NO GROWTH   Final    Culture NO GROWTH   Final    Report Status 01/01/2012 FINAL   Final   CULTURE, BLOOD (ROUTINE X 2)     Status: Normal (Preliminary result)   Collection Time   12/31/11  8:42 AM  Component Value Range Status Comment   Specimen Description BLOOD LEFT ARM   Final    Special Requests BOTTLES DRAWN AEROBIC ONLY 10CC   Final    Culture  Setup Time [id number]   Final    Culture     Final    Value:        BLOOD CULTURE RECEIVED NO GROWTH TO DATE CULTURE WILL BE HELD FOR 5 DAYS BEFORE ISSUING A FINAL NEGATIVE REPORT   Report Status PENDING   Incomplete   CULTURE, BLOOD (ROUTINE X 2)     Status: Normal (Preliminary result)   Collection Time   12/31/11  8:48 AM      Component Value Range Status Comment   Specimen Description BLOOD LEFT HAND   Final    Special Requests BOTTLES DRAWN AEROBIC ONLY 10CC   Final    Culture  Setup Time [id number]   Final    Culture     Final    Value:        BLOOD CULTURE RECEIVED NO GROWTH TO DATE CULTURE WILL BE HELD FOR 5 DAYS BEFORE ISSUING A FINAL NEGATIVE REPORT   Report Status PENDING   Incomplete   CULTURE, SPUTUM-ASSESSMENT     Status: Normal   Collection Time   12/31/11  2:15 PM        Component Value Range Status Comment   Specimen Description SPUTUM   Final    Special Requests NONE   Final    Sputum evaluation     Final    Value: THIS SPECIMEN IS ACCEPTABLE. RESPIRATORY CULTURE REPORT TO FOLLOW.   Report Status 12/31/2011 FINAL   Final   CULTURE, RESPIRATORY     Status: Normal (Preliminary result)   Collection Time   12/31/11  2:15 PM      Component Value Range Status Comment   Specimen Description SPUTUM   Final    Special Requests NONE   Final    Gram Stain     Final    Value: NO WBC SEEN     NO SQUAMOUS EPITHELIAL CELLS SEEN     FEW YEAST   Culture NORMAL OROPHARYNGEAL FLORA   Final    Report Status PENDING   Incomplete     Studies/Results: Ct Chest W Contrast  12/31/2011  *RADIOLOGY REPORT*  Clinical Data: Cough.  Left lung mass.  CT CHEST WITH CONTRAST  Technique:  Multidetector CT imaging of the chest was performed following the standard protocol during bolus administration of intravenous contrast.  Contrast: 70mL OMNIPAQUE IOHEXOL 300 MG/ML IJ SOLN  Comparison: Abdominal CT and chest radiographs 12/30/2011.  Findings: There is dense air space disease with air bronchograms throughout the left upper lobe.  There is mild associated volume loss.  There are patchy airspace opacities within the superior segment of the left lower lobe, the right upper lobe and the right middle lobe.  There is a focal subpleural nodular density posteriorly in the left lower lobe on image 41, measuring 2.9 x 1.3 cm.  No enlarged mediastinal or hilar lymph nodes are identified.  There is no endobronchial lesion.  A small pleural effusion is present on the left.  There is no significant right pleural effusion or pericardial effusion. Although not specifically performed to evaluate for pulmonary embolism, the pulmonary arteries appear satisfactorily opacified and there is no evidence for that on this examination.  The visualized upper abdomen appears unremarkable.  There are no suspicious  osseous findings.  IMPRESSION:  1.  Dense left upper lobe air space disease with air bronchograms and scattered air space opacities throughout the additional lobes as described most consistent with multilobar pneumonia. 2.  There is a focal subpleural nodular density in the left lower lobe which is nonspecific.  This could reflect an area of inflammation or potentially a pulmonary infarct.  There is no specific evidence of pulmonary embolism on this non dedicated examination. 3.  No endobronchial lesion or lymphadenopathy. 4.  Small left pleural effusion.  Clinical correlation and close radiographic follow-up following appropriate treatment for pneumonia are necessary to exclude neoplasm.  Original Report Authenticated By: Gerrianne Scale, M.D.   Ct Maxillofacial Wo Cm  01/01/2012  *RADIOLOGY REPORT*  Clinical Data: Left eye swollen with drainage.  Headache.  CT MAXILLOFACIAL WITHOUT CONTRAST  Technique:  Multidetector CT imaging of the maxillofacial structures was performed. Multiplanar CT image reconstructions were also generated.  Comparison: None.  Findings: Soft tissue swelling of the left eye lid.  The globe is normal.  No edema or abscess is present in the orbit.  Findings are most compatible with superficial cellulitis.  Frontal sinuses are hypoplastic.  Paranasal sinuses are   clear without evidence of sinusitis.  No acute bony changes.  IMPRESSION: Superficial cellulitis of the eye lid on the left.  Negative for orbital cellulitis or abscess.  Original Report Authenticated By: Camelia Phenes, M.D.      Assessment/Plan: Donna Tapia is a 61 y.o. female with  Sudden onset anterior uveitis ? Cause, also with multilobar pneumonia and back pain now with loose bowel movements  1) Uveitis: Ophthalmology (dr. Randon Goldsmith) following. ID workup negative so far with HIV neg, RPR Toxo ab negative. pPD negative. ACE is normal, ? This an autoimmune process. She has more pain today. I think she needs to be seen  again by ophtho. I dont think she has she'll cellulitis of the eyelid and I don't see a need to add antibiotics to cover that at this time --followup on further ab testing --defer management to ophtho  2) Multilobar PNeumonia?: on avelox and stable , worried about loose bowel movements  3) Loose bowel movements: Not had further loose bowel movements since yesterday Could be medicine induced C. difficile PCR has been ordered --Will observe but it has no more loose bowel movements movements for testing for C. difficile and it continues to improve a GI standpoint would DC the C. difficile PCR  4) low back pain: Would consider MRI with contrast of her lumbosacral area.   LOS: 3 days   Paulette Blanch Dam 01/02/2012, 11:14 AM  I AM SWITCHING TO CEFTAZ VANCO AND MOXI (IN PLACE OF CEFPIME) TO MAXIMIZE PENETRATION OF ABX INTO EYE AND USE ABX THAT HAVE BEEN STUDIES BETTER. I DONT THINK THESE PARENTERAL ABX WILL MAKE HUGE DIFFERENCE TO EYE ITSELF. WILL NEED SURGERY AND INTRAVITREAL ABX

## 2012-01-02 NOTE — Evaluation (Signed)
Clinical/Bedside Swallow Evaluation Patient Details  Name: Donna Tapia MRN: [id number] DOB: [birth year] Today's Date: 01/02/2012  Past Medical History:  Past Medical History  Diagnosis Date  . Hepatitis B    Past Surgical History:  Past Surgical History  Procedure Date  . Abdominal hysterectomy    HPI:  61 y/o female admitted to ER with sudden onset of loss of vision in left eye.  Per medical chart patient with nausea, vomiting, and diarrhea one week prior. BSE ordered to rule out aspiration secondary to results of CXR.  No prior reports of dysphagia .   Assessment/Recommendations/Treatment Plan    SLP Assessment Clinical Impression Statement: Oropharyngeal swallow functional for regular consistency and thin liquids with no outward clinical s/s of aspiration observed.  Patient reports difficulty with solids secondary to "dry mouth" and reports most solids taste "sour". Tenna Child functional for current diet consistency but may want to consider clear liquid vs. full liquid mainly  due to reports of discomfort  with solids.  Question oral thrush and possible Sjogren's due to symptoms present at bedside.  No further ST warranted in acute care venue as no dysphagia indicated.    ST to sign off as education complete. Please re consult ST  if warranted.   Risk for Aspiration: Mild  Swallow Evaluation Recommendations Solid Consistency: Regular Liquid Consistency: Thin Liquid Administration via: Straw;Cup Medication Administration: Whole meds with liquid Supervision: Patient able to self feed Postural Changes and/or Swallow Maneuvers: Out of bed for meals Follow up Recommendations: None  Treatment Plan Treatment Plan Recommendations: No treatment recommended at this time        General  Date of Onset: 12/31/11 HPI: 61 y/o female admitted to ER with sudden onset of loss of vision in left eye.  Per medical chart patient with nausea, vomiting, and diarrhea one week prior. BSE ordered  to rule out aspiration secondary to results of CXR.  No prior reports of dysphagia . Type of Study: Bedside swallow evaluation Diet Prior to this Study: Regular;Thin liquids Temperature Spikes Noted: No Respiratory Status: Room air History of Intubation: No Behavior/Cognition: Alert;Cooperative Oral Cavity - Dentition: Adequate natural dentition Patient Positioning: Upright in bed Baseline Vocal Quality: Clear Volitional Cough: Strong Volitional Swallow: Able to elicit  Oral Motor/Sensory Function  Overall Oral Motor/Sensory Function: Appears within functional limits for tasks assessed  Consistency Results  Ice Chips Ice chips: Within functional limits  Thin Liquid Thin Liquid: Within functional limits     Honey Thick Liquid Honey Thick Liquid: Not tested  Puree Puree:tested  Within functional limits  Solid Solid: tested  Within functional limits Moreen Fowler MS, CCC-SLP [phone] [facility] 01/02/2012,2:32 PM

## 2012-01-02 NOTE — Progress Notes (Signed)
Subjective: Patient seen and examined this am. Still no vision over left eye. Also c/o persistent LBP and pain over left shoulder which is worse and started since yesterday  Objective:  Vital signs in last 24 hours:  Filed Vitals:   01/01/12 0529 01/01/12 1500 01/01/12 2159 01/02/12 0523  BP: 121/64 120/60 143/62 133/63  Pulse: 74 76 78 83  Temp: 98.5 F (36.9 C) 98.4 F (36.9 C) 98.8 F (37.1 C) 98.2 F (36.8 C)  TempSrc: Oral Oral Oral Oral  Resp: 18 18 17 17   Height:      Weight: 59.7 kg (131 lb 9.8 oz)   62.3 kg (137 lb 5.6 oz)  SpO2: 94% 95% 94% 90%    Intake/Output from previous day:   Intake/Output Summary (Last 24 hours) at 01/02/12 1311 Last data filed at 01/02/12 0600  Gross per 24 hour  Intake   2890 ml  Output   1025 ml  Net   1865 ml    Physical Exam:  General: elderly thin built female in NAD  HEENT: constricted left pupil with left eye uveitis which appears much worse today on exam. , conjunctival injection noted. no pallor, no icterus, moist oral mucosa, no JVD, no lymphadenopathy. Herpetic lesions over rt lower lip  Heart: Normal s1 &s2 Regular rate and rhythm, without murmurs, rubs, gallops.  Lungs: crackles over left side  Abdomen: Soft, nontender, nondistended, positive bowel sounds.  Extremities: erythema with warmth and tenderness over left shoulder with severe pain on palpation and limited ROM, No clubbing cyanosis or edema with positive pedal pulses.  Neuro: Alert, awake, oriented x3, nonfocal.   Lab Results:  Basic Metabolic Panel:    Component Value Date/Time   NA 132* 01/02/2012 0845   K 3.5 01/02/2012 0845   CL 100 01/02/2012 0845   CO2 23 01/02/2012 0845   BUN 9 01/02/2012 0845   CREATININE 0.55 01/02/2012 0845   GLUCOSE 96 01/02/2012 0845   CALCIUM 7.5* 01/02/2012 0845   CBC:    Component Value Date/Time   WBC 16.5* 01/02/2012 0845   HGB 7.3* 01/02/2012 0845   HCT 21.2* 01/02/2012 0845   PLT 432* 01/02/2012 0845   MCV 91.0 01/02/2012 0845   NEUTROABS 15.1* 12/31/2011 1155   LYMPHSABS 0.8 12/31/2011 1155   MONOABS 0.5 12/31/2011 1155   EOSABS 0.0 12/31/2011 1155   BASOSABS 0.0 12/31/2011 1155    Recent Results (from the past 240 hour(s))  URINE CULTURE     Status: Normal   Collection Time   12/31/11  4:15 AM      Component Value Range Status Comment   Specimen Description URINE, CLEAN CATCH   Final    Special Requests NONE   Final    Culture  Setup Time [id number]   Final    Colony Count NO GROWTH   Final    Culture NO GROWTH   Final    Report Status 01/01/2012 FINAL   Final   CULTURE, BLOOD (ROUTINE X 2)     Status: Normal (Preliminary result)   Collection Time   12/31/11  8:42 AM      Component Value Range Status Comment   Specimen Description BLOOD LEFT ARM   Final    Special Requests BOTTLES DRAWN AEROBIC ONLY 10CC   Final    Culture  Setup Time [id number]   Final    Culture     Final    Value:        BLOOD CULTURE RECEIVED NO  GROWTH TO DATE CULTURE WILL BE HELD FOR 5 DAYS BEFORE ISSUING A FINAL NEGATIVE REPORT   Report Status PENDING   Incomplete   CULTURE, BLOOD (ROUTINE X 2)     Status: Normal (Preliminary result)   Collection Time   12/31/11  8:48 AM      Component Value Range Status Comment   Specimen Description BLOOD LEFT HAND   Final    Special Requests BOTTLES DRAWN AEROBIC ONLY 10CC   Final    Culture  Setup Time [id number]   Final    Culture     Final    Value:        BLOOD CULTURE RECEIVED NO GROWTH TO DATE CULTURE WILL BE HELD FOR 5 DAYS BEFORE ISSUING A FINAL NEGATIVE REPORT   Report Status PENDING   Incomplete   CULTURE, SPUTUM-ASSESSMENT     Status: Normal   Collection Time   12/31/11  2:15 PM      Component Value Range Status Comment   Specimen Description SPUTUM   Final    Special Requests NONE   Final    Sputum evaluation     Final    Value: THIS SPECIMEN IS ACCEPTABLE. RESPIRATORY CULTURE REPORT TO FOLLOW.   Report Status 12/31/2011 FINAL   Final   CULTURE, RESPIRATORY     Status: Normal  (Preliminary result)   Collection Time   12/31/11  2:15 PM      Component Value Range Status Comment   Specimen Description SPUTUM   Final    Special Requests NONE   Final    Gram Stain     Final    Value: NO WBC SEEN     NO SQUAMOUS EPITHELIAL CELLS SEEN     FEW YEAST   Culture NORMAL OROPHARYNGEAL FLORA   Final    Report Status PENDING   Incomplete     Studies/Results: Ct Chest W Contrast  12/31/2011  *RADIOLOGY REPORT*  Clinical Data: Cough.  Left lung mass.  CT CHEST WITH CONTRAST  Technique:  Multidetector CT imaging of the chest was performed following the standard protocol during bolus administration of intravenous contrast.  Contrast: 70mL OMNIPAQUE IOHEXOL 300 MG/ML IJ SOLN  Comparison: Abdominal CT and chest radiographs 12/30/2011.  Findings: There is dense air space disease with air bronchograms throughout the left upper lobe.  There is mild associated volume loss.  There are patchy airspace opacities within the superior segment of the left lower lobe, the right upper lobe and the right middle lobe.  There is a focal subpleural nodular density posteriorly in the left lower lobe on image 41, measuring 2.9 x 1.3 cm.  No enlarged mediastinal or hilar lymph nodes are identified.  There is no endobronchial lesion.  A small pleural effusion is present on the left.  There is no significant right pleural effusion or pericardial effusion. Although not specifically performed to evaluate for pulmonary embolism, the pulmonary arteries appear satisfactorily opacified and there is no evidence for that on this examination.  The visualized upper abdomen appears unremarkable.  There are no suspicious osseous findings.  IMPRESSION:  1.  Dense left upper lobe air space disease with air bronchograms and scattered air space opacities throughout the additional lobes as described most consistent with multilobar pneumonia. 2.  There is a focal subpleural nodular density in the left lower lobe which is nonspecific.   This could reflect an area of inflammation or potentially a pulmonary infarct.  There is no specific evidence of pulmonary  embolism on this non dedicated examination. 3.  No endobronchial lesion or lymphadenopathy. 4.  Small left pleural effusion.  Clinical correlation and close radiographic follow-up following appropriate treatment for pneumonia are necessary to exclude neoplasm.  Original Report Authenticated By: Gerrianne Scale, M.D.   Ct Maxillofacial Wo Cm  01/01/2012  *RADIOLOGY REPORT*  Clinical Data: Left eye swollen with drainage.  Headache.  CT MAXILLOFACIAL WITHOUT CONTRAST  Technique:  Multidetector CT imaging of the maxillofacial structures was performed. Multiplanar CT image reconstructions were also generated.  Comparison: None.  Findings: Soft tissue swelling of the left eye lid.  The globe is normal.  No edema or abscess is present in the orbit.  Findings are most compatible with superficial cellulitis.  Frontal sinuses are hypoplastic.  Paranasal sinuses are   clear without evidence of sinusitis.  No acute bony changes.  IMPRESSION: Superficial cellulitis of the eye lid on the left.  Negative for orbital cellulitis or abscess.  Original Report Authenticated By: Camelia Phenes, M.D.    Medications: Scheduled Meds:   . bacitracin-polymyxin b   Left Eye BID  . feeding supplement  237 mL Oral BID  . moxifloxacin  400 mg Intravenous Q24H  . sodium chloride  3 mL Intravenous Q12H  . white petrolatum      . DISCONTD: prednisoLONE acetate  1 drop Left Eye Q1H while awake  . DISCONTD: scopolamine  1 drop Left Eye BID   Continuous Infusions:   . sodium chloride 75 mL (01/01/12 1024)   PRN Meds:.acetaminophen, acetaminophen, fentaNYL, HYDROcodone-acetaminophen, ondansetron (ZOFRAN) IV, ondansetron   Assessment:  58 female with chr low back pain and hx of hep B presents with 2 days hx of painless left eye blindness with hx of nausea. Vomiting an diarrhea 1 week back with findings of  left anterior uveitis and multilobar left sided pneumonia on CT scan.   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 Unclear if this is a disseminated bacteremic process. Will get X ray lt shoulder jt to evaluate for effusion and get ortho consult for evaluation. Plan on getting MRI head to evaluate the orbits  abx coverage broadened with IV vanco and cefepime by ID Another ophthalmologist will see her in am to evalaute her vitreous and get some samples for coverage. Autoimmune w/up including HLA ag, hepatitis panel pending  Cont prednisone drops and scopolamine  Xray SI jt unremarkable  Elevated alk P noted, CMV PCR, toxoplasma ag negative, RPR negative, HIV, bartonella , hepatitis panel in progress ACE level normal  Discussed with Dr Domingo Pulse from rheum , agrees to follow up in clinic after discharge if autoimmune w/up positive ( she does not have insurance so this might not be possible)   Left sided multilobar PNA  Started on avelox And will continue ( day 4)   sputum cx no growth Swallow eval to r/o aspiration  No temp spike in 48 hrs Cultures so far negative   ? Sinusitis  CT sinuses done negative  there is superficial cellulitis over the eye lid and the eye appears quite worse on exam. Plan to get MRI of orbits with concern for panopthalmitis.  Anemia  Iron deficiency noted on iron panel  b12 and folate wnl  Stool for occult blood  negative  Check tsh  Will monitor closely, hold off on trasnfusion  For now until acute issues resolve will need GI eval once acute issue resolved   Herpes labialis  Seen by ID consults and recommends starting valtrex which seems to have been discontinued . Will discuss further plan with  ID  Diarrhea  Patient had hx of lose BMs 1 week back and 2 loose BM yesterday. ID recommends ruling out for c diff   DVT prophylaxis: SCD  Full code     LOS: 3 days   Donna Tapia 01/02/2012, 1:11 PM

## 2012-01-02 NOTE — Progress Notes (Addendum)
ANTIBIOTIC CONSULT NOTE - INITIAL  Pharmacy Consult for Vancomycin Indication: ? endopthalmitis  Allergies  Allergen Reactions  . Sulfur     Patient Measurements: Height: 5\' 9"  (175.3 cm) Weight: 137 lb 5.6 oz (62.3 kg) IBW/kg (Calculated) : 66.2  Adjusted Body Weight:66.2  Vital Signs: Temp: 98.2 F (36.8 C) (03/03 0523) Temp src: Oral (03/03 0523) BP: 133/63 mmHg (03/03 0523) Pulse Rate: 83  (03/03 0523) Intake/Output from previous day: 03/02 0701 - 03/03 0700 In: 3010 [P.O.:960; I.V.:1800; IV Piggyback:250] Out: 1025 [Urine:1025] Intake/Output from this shift:    Labs:  Basename 01/02/12 0845 01/01/12 0600 12/31/11 1155  WBC 16.5* 16.9* 16.4*  HGB 7.3* 7.7* 7.7*  PLT 432* 380 381  LABCREA -- -- --  CREATININE 0.55 0.67 0.82   Estimated Creatinine Clearance: 71.7 ml/min (by C-G formula based on Cr of 0.55). No results found for this basename: VANCOTROUGH:2,VANCOPEAK:2,VANCORANDOM:2,GENTTROUGH:2,GENTPEAK:2,GENTRANDOM:2,TOBRATROUGH:2,TOBRAPEAK:2,TOBRARND:2,AMIKACINPEAK:2,AMIKACINTROU:2,AMIKACIN:2, in the last 72 hours   Microbiology: Recent Results (from the past 720 hour(s))  URINE CULTURE     Status: Normal   Collection Time   12/31/11  4:15 AM      Component Value Range Status Comment   Specimen Description URINE, CLEAN CATCH   Final    Special Requests NONE   Final    Culture  Setup Time [id number]   Final    Colony Count NO GROWTH   Final    Culture NO GROWTH   Final    Report Status 01/01/2012 FINAL   Final   CULTURE, BLOOD (ROUTINE X 2)     Status: Normal (Preliminary result)   Collection Time   12/31/11  8:42 AM      Component Value Range Status Comment   Specimen Description BLOOD LEFT ARM   Final    Special Requests BOTTLES DRAWN AEROBIC ONLY 10CC   Final    Culture  Setup Time [id number]   Final    Culture     Final    Value:        BLOOD CULTURE RECEIVED NO GROWTH TO DATE CULTURE WILL BE HELD FOR 5 DAYS BEFORE ISSUING A FINAL NEGATIVE REPORT   Report Status PENDING   Incomplete   CULTURE, BLOOD (ROUTINE X 2)     Status: Normal (Preliminary result)   Collection Time   12/31/11  8:48 AM      Component Value Range Status Comment   Specimen Description BLOOD LEFT HAND   Final    Special Requests BOTTLES DRAWN AEROBIC ONLY 10CC   Final    Culture  Setup Time [id number]   Final    Culture     Final    Value:        BLOOD CULTURE RECEIVED NO GROWTH TO DATE CULTURE WILL BE HELD FOR 5 DAYS BEFORE ISSUING A FINAL NEGATIVE REPORT   Report Status PENDING   Incomplete   CULTURE, SPUTUM-ASSESSMENT     Status: Normal   Collection Time   12/31/11  2:15 PM      Component Value Range Status Comment   Specimen Description SPUTUM   Final    Special Requests NONE   Final    Sputum evaluation     Final    Value: THIS SPECIMEN IS ACCEPTABLE. RESPIRATORY CULTURE REPORT TO FOLLOW.   Report Status 12/31/2011 FINAL   Final   CULTURE, RESPIRATORY     Status: Normal (Preliminary result)   Collection Time   12/31/11  2:15 PM      Component Value Range  Status Comment   Specimen Description SPUTUM   Final    Special Requests NONE   Final    Gram Stain     Final    Value: NO WBC SEEN     NO SQUAMOUS EPITHELIAL CELLS SEEN     FEW YEAST   Culture NORMAL OROPHARYNGEAL FLORA   Final    Report Status PENDING   Incomplete     Medical History: Past Medical History  Diagnosis Date  . Hepatitis B     Medications:  Prescriptions prior to admission  Medication Sig Dispense Refill  . acetaminophen (TYLENOL) 325 MG tablet Take 650 mg by mouth every 6 (six) hours as needed. For pain      . CO ENZYME Q-10 PO Take 1 capsule by mouth daily.      . folic acid (FOLVITE) 800 MCG tablet Take 400 mcg by mouth daily.      Marland Kitchen loperamide (IMODIUM) 2 MG capsule Take 2 mg by mouth 4 (four) times daily as needed. For diarrhea      . Multiple Vitamin (MULITIVITAMIN WITH MINERALS) TABS Take 1 tablet by mouth daily.      Marland Kitchen OVER THE COUNTER MEDICATION Take 1 tablet by mouth  every 6 (six) hours as needed. Cold and Sinus [employer]       Assessment: Pt 61 yo female admitted with PNA complaint of blindness lt eye to be started n vancomycin for endopthalmitis  Fortaz to be added for extended coverage.  Goal of Therapy:  Vancomycin trough level 15-20 mcg/ml  Plan:  Follow up culture results vancomycin 750 mg q12h fortaz 1 gm iv q8h  Lucille Passy 01/02/2012,1:38 PM

## 2012-01-02 NOTE — Progress Notes (Addendum)
Patient ID: Donna Tapia, female   DOB: 09-28-1951, 61 y.o.   MRN: [id number]   HPI: Donna Tapia is an 61 y.o. female seen in follow up of uveitis OS.   The patient reports that the left eye vision seems "the same."  The eye is more "sensitive" and feels "full".    She reports significant dry mouth.  She also endorses new onset left shoulder tenderness to touch and soreness with ROM.    Past Medical History  Diagnosis Date  . Hepatitis B    Past Surgical History  Procedure Date  . Abdominal hysterectomy    Family History  Problem Relation Age of Onset  . Coronary artery disease Mother   . Parkinsonism Mother    Current Facility-Administered Medications  Medication Dose Route Frequency Provider Last Rate Last Dose  . 0.9 %  sodium chloride infusion   Intravenous Continuous [name], MD 75 mL/hr at 01/01/12 1024 75 mL at 01/01/12 1024  . acetaminophen (TYLENOL) tablet 650 mg  650 mg Oral Q6H PRN [name], MD   650 mg at 12/31/11 2234   Or  . acetaminophen (TYLENOL) suppository 650 mg  650 mg Rectal Q6H PRN [name], MD      . bacitracin-polymyxin b (POLYSPORIN) ophthalmic ointment   Left Eye BID [name], MD      . feeding supplement (ENSURE CLINICAL STRENGTH) liquid 237 mL  237 mL Oral BID [name], RD   237 mL at 01/01/12 1001  . fentaNYL (SUBLIMAZE) injection 25 mcg  25 mcg Intravenous Q3H PRN [name], MD   25 mcg at 01/02/12 0801  . HYDROcodone-acetaminophen (NORCO) 10-325 MG per tablet 1 tablet  1 tablet Oral Q4H PRN [name], MD      . moxifloxacin (AVELOX) IVPB 400 mg  400 mg Intravenous Q24H [name], MD   400 mg at 01/02/12 0047  . ondansetron (ZOFRAN) tablet 4 mg  4 mg Oral Q6H PRN [name], MD       Or  . ondansetron [facility]) injection 4 mg  4 mg Intravenous Q6H PRN [name], MD   4 mg at 01/01/12 2224  . prednisoLONE acetate (PRED FORTE) 1 % ophthalmic  suspension 1 drop  1 drop Left Eye Q1H while awake Antony Contras, MD   1 drop at 01/02/12 1143  . scopolamine (HYOSCINE) 0.25 % ophthalmic solution 1 drop  1 drop Left Eye BID Antony Contras, MD   1 drop at 01/02/12 1143  . sodium chloride 0.9 % injection 3 mL  3 mL Intravenous Q12H [name], MD   3 mL at 12/31/11 2235  . white petrolatum (VASELINE) gel        1 application at 01/02/12 0945     Physical Exam:  Blood pressure 133/63, pulse 83, temperature 98.2 F (36.8 C), temperature source Oral, resp. rate 17, height 5\' 9"  (1.753 m), weight 62.3 kg (137 lb 5.6 oz), SpO2 90.00%.  Oriented to person, place and time.  Fatigued appearing.  Responds slowly, but coherently to questions.   L shoulder:  Soft tissue erythema; subcutaneous fullness that is somehwat fluctuant and TTP  VA OS:  HM @ 2 ft.   Pupils:   OD round, reactive to light, no APD            OS irregular, no reactive; + APD by reverse.  IOP (T pen)  OD   OS 27-36 (multiple  measurements taken with T Pen; no appl)     Motility:  OD full ductions  OS -2 in all directions     Slit lamp examination:                                 OD                                       External/adnexa: Normal                                      Lids/lashes:        Normal                                      Conjunctiva        White, quiet        Cornea:              Clear                  AC:                     Deep, quiet                                Iris:                     Normal        Lens:                  Clear                                       OS                                       External/adnexa: 1+ periocular ST edema; Lids/lashes:        Complete ptosis; 2+ ST edema, trace erythema.                             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 RESPIRATORY CULTURE REPORT TO FOLLOW.   Report Status 12/31/2011 FINAL     CULTURE, RESPIRATORY     Status: Normal (Preliminary result)   Collection Time   12/31/11  2:15 PM      Component Value Range Comment   Specimen Description SPUTUM      Special Requests NONE      Gram Stain        Value: NO WBC SEEN     NO SQUAMOUS EPITHELIAL CELLS SEEN     FEW YEAST   Culture NORMAL OROPHARYNGEAL FLORA      Report Status PENDING     TOXOPLASMA ANTIBODIES- IGG AND  IGM     Status: Normal   Collection Time   12/31/11  3:18 PM      Component Value Range Comment   Toxoplasma IgG Ratio <0.5  <6.4 (IU/mL)    Toxoplasma Antibody- IgM <0.10  <0.90 (IV)   HIV ANTIBODY (ROUTINE TESTING)     Status: Normal   Collection Time   01/01/12  6:00 AM      Component Value Range Comment   HIV NON REACTIVE  NON REACTIVE    ANGIOTENSIN CONVERTING ENZYME     Status: Normal   Collection Time   01/01/12  6:00 AM      Component Value Range Comment   Angiotensin-Converting Enzyme 46  8 - 52 (U/L)   CBC     Status: Abnormal   Collection Time   01/01/12  6:00 AM      Component Value Range Comment   WBC 16.9 (*) 4.0 - 10.5 (K/uL)    RBC 2.50 (*) 3.87 - 5.11 (MIL/uL)    Hemoglobin 7.7 (*) 12.0 - 15.0 (g/dL)    HCT 91.4 (*) 78.2 - 46.0 (%)    MCV 92.4  78.0 - 100.0 (fL)    MCH 30.8   26.0 - 34.0 (pg)    MCHC 33.3  30.0 - 36.0 (g/dL)    RDW 95.6  21.3 - 08.6 (%)    Platelets 380  150 - 400 (K/uL)   BASIC METABOLIC PANEL     Status: Abnormal   Collection Time   01/01/12  6:00 AM      Component Value Range Comment   Sodium 136  135 - 145 (mEq/L)    Potassium 3.1 (*) 3.5 - 5.1 (mEq/L)    Chloride 103  96 - 112 (mEq/L)    CO2 24  19 - 32 (mEq/L)    Glucose, Bld 98  70 - 99 (mg/dL)    BUN 15  6 - 23 (mg/dL)    Creatinine, Ser 5.78  0.50 - 1.10 (mg/dL)    Calcium 7.9 (*) 8.4 - 10.5 (mg/dL)    GFR calc non Af Amer >90  >90 (mL/min)  GFR calc Af Amer >90  >90 (mL/min)   CBC     Status: Abnormal   Collection Time   01/02/12  8:45 AM      Component Value Range Comment   WBC 16.5 (*) 4.0 - 10.5 (K/uL)    RBC 2.33 (*) 3.87 - 5.11 (MIL/uL)    Hemoglobin 7.3 (*) 12.0 - 15.0 (g/dL)    HCT 46.9 (*) 62.9 - 46.0 (%)    MCV 91.0  78.0 - 100.0 (fL)    MCH 31.3  26.0 - 34.0 (pg)    MCHC 34.4  30.0 - 36.0 (g/dL)    RDW 52.8  41.3 - 24.4 (%)    Platelets 432 (*) 150 - 400 (K/uL)   BASIC METABOLIC PANEL     Status: Abnormal   Collection Time   01/02/12  8:45 AM      Component Value Range Comment   Sodium 132 (*) 135 - 145 (mEq/L)    Potassium 3.5  3.5 - 5.1 (mEq/L)    Chloride 100  96 - 112 (mEq/L)    CO2 23  19 - 32 (mEq/L)    Glucose, Bld 96  70 - 99 (mg/dL)    BUN 9  6 - 23 (mg/dL)    Creatinine, Ser 0.10  0.50 - 1.10 (mg/dL)    Calcium 7.5 (*) 8.4 - 10.5 (mg/dL)    GFR calc non Af Amer >90  >90 (mL/min)    GFR calc Af Amer >90  >90 (mL/min)    Ct Chest W Contrast  12/31/2011  *RADIOLOGY REPORT*  Clinical Data: Cough.  Left lung mass.  CT CHEST WITH CONTRAST  Technique:  Multidetector CT imaging of the chest was performed following the standard protocol during bolus administration of intravenous contrast.  Contrast: 70mL OMNIPAQUE IOHEXOL 300 MG/ML IJ SOLN  Comparison: Abdominal CT and chest radiographs 12/30/2011.  Findings: There is dense air space disease with air  bronchograms throughout the left upper lobe.  There is mild associated volume loss.  There are patchy airspace opacities within the superior segment of the left lower lobe, the right upper lobe and the right middle lobe.  There is a focal subpleural nodular density posteriorly in the left lower lobe on image 41, measuring 2.9 x 1.3 cm.  No enlarged mediastinal or hilar lymph nodes are identified.  There is no endobronchial lesion.  A small pleural effusion is present on the left.  There is no significant right pleural effusion or pericardial effusion. Although not specifically performed to evaluate for pulmonary embolism, the pulmonary arteries appear satisfactorily opacified and there is no evidence for that on this examination.  The visualized upper abdomen appears unremarkable.  There are no suspicious osseous findings.  IMPRESSION:  1.  Dense left upper lobe air space disease with air bronchograms and scattered air space opacities throughout the additional lobes as described most consistent with multilobar pneumonia. 2.  There is a focal subpleural nodular density in the left lower lobe which is nonspecific.  This could reflect an area of inflammation or potentially a pulmonary infarct.  There is no specific evidence of pulmonary embolism on this non dedicated examination. 3.  No endobronchial lesion or lymphadenopathy. 4.  Small left pleural effusion.  Clinical correlation and close radiographic follow-up following appropriate treatment for pneumonia are necessary to exclude neoplasm.  Original Report Authenticated By: Gerrianne Scale, M.D.   Ct Maxillofacial Wo Cm  01/01/2012  *RADIOLOGY REPORT*  Clinical Data: Left eye swollen with  drainage.  Headache.  CT MAXILLOFACIAL WITHOUT CONTRAST  Technique:  Multidetector CT imaging of the maxillofacial structures was performed. Multiplanar CT image reconstructions were also generated.  Comparison: None.  Findings: Soft tissue swelling of the left eye lid.  The  globe is normal.  No edema or abscess is present in the orbit.  Findings are most compatible with superficial cellulitis.  Frontal sinuses are hypoplastic.  Paranasal sinuses are   clear without evidence of sinusitis.  No acute bony changes.  IMPRESSION: Superficial cellulitis of the eye lid on the left.  Negative for orbital cellulitis or abscess.  Original Report Authenticated By: Camelia Phenes, M.D.                             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  Differential should now include: endophthalmitis / panophthalmitis (bacterial vs fungal);  less common etioilogies such as Bechets, lymphoma, etc should also be iclude  Not clear at this piont which of the above processes we are dealing with, but the left eye certainly has worsened over past 2 days.What is the relation, if any, to her systemic processes?--- low back pain, pneumonia, ?HSV labialis, and now new onset should pain/erythema/edema of the L shoulder?   My biggest concern is that of systemic bacteremia (or fungemia) leading to a possible endogenous endophthalmitis.  However, her blood cultures remain negative to this point.  I would recommend following these closely and increasing her abx coverage for that of an unclassified (culture negative) bacteremia.  I've discussed this with I.D., who has agreed to broaden her systemic coverage.   The limited ocular motility, ptosis and diffuse inflammation of the globe raises a concern for panopthalmitis and/or possible orbital disease.  Even in the setting of her recent CT MXF with out orbital disease I recommend MRI or orbits and brain with and without contrast to further evaluate.   Unclear what process is affecting the left shoulder.  Discussed with Primary team.  Would recommend and appreciate Ortho input, if there is any fluid collection/abscess/joint process that could be drained and sent for culture/Se.      I've spoke with Dr. Clarisa Kindred who agrees with the above and will graciously assist in this patient's evaluation including bed side B-scan.    Summary of above:  ?Panophthalmitis MRI Brain/Orbit. Broad spectrum IV abx.  Further eval of shoulder. Retina consult/eval.   For local therapy:  D/C the topical pred in s/o epi defect and lack of response to treatment.    Start Cosopt BID OS for the elevated IOP.  Start Vigamox q 2 hrs OS (broad spectrum coverage with intraocular penetration) Increase polytrim ung to QID OS to cover the epi defect.     All of the above information was relayed to the patient. All questions were answered.  Will continue to follow.   Aleene Swanner 01/02/2012, 12:27 PM  Signed at [facility] [phone]    ** Reviewed MRI Orbits/Brain.  Discussed with radiologist Dr. Jena Gauss on call; and again with Dr. Clarisa Kindred who will see patient tomorrow a.m. to assist with further eval/management of likely endophthalmitis/panophthalmitis OS.

## 2012-01-03 ENCOUNTER — Inpatient Hospital Stay (HOSPITAL_COMMUNITY): Payer: Medicaid Other

## 2012-01-03 DIAGNOSIS — J8410 Pulmonary fibrosis, unspecified: Secondary | ICD-10-CM

## 2012-01-03 DIAGNOSIS — J9600 Acute respiratory failure, unspecified whether with hypoxia or hypercapnia: Secondary | ICD-10-CM

## 2012-01-03 LAB — CBC
HCT: 22.7 % — ABNORMAL LOW (ref 36.0–46.0)
Platelets: 492 10*3/uL — ABNORMAL HIGH (ref 150–400)
RBC: 2.43 MIL/uL — ABNORMAL LOW (ref 3.87–5.11)
RDW: 15.1 % (ref 11.5–15.5)
WBC: 14.7 10*3/uL — ABNORMAL HIGH (ref 4.0–10.5)

## 2012-01-03 LAB — CULTURE, RESPIRATORY W GRAM STAIN

## 2012-01-03 LAB — HEPATITIS PANEL, ACUTE
HCV Ab: NEGATIVE
Hepatitis B Surface Ag: NEGATIVE

## 2012-01-03 LAB — ANCA SCREEN W REFLEX TITER
Atypical p-ANCA Screen: NEGATIVE
p-ANCA Screen: NEGATIVE

## 2012-01-03 LAB — FLUORESCENT TREPONEMAL AB(FTA)-IGG-BLD
Fluorescent Treponemal Ab, IgG: NONREACTIVE
Fluorescent Treponemal Ab, IgG: NONREACTIVE

## 2012-01-03 LAB — BASIC METABOLIC PANEL
CO2: 23 mEq/L (ref 19–32)
Chloride: 101 mEq/L (ref 96–112)
GFR calc Af Amer: >90 mL/min (ref >90)
Potassium: 3.3 mEq/L — ABNORMAL LOW (ref 3.5–5.1)

## 2012-01-03 LAB — ANA: Anti Nuclear Antibody(ANA): NEGATIVE

## 2012-01-03 LAB — MITOCHONDRIAL ANTIBODIES: Mitochondrial M2 Ab, IgG: 0.48 (ref <0.91)

## 2012-01-03 MED ORDER — VANCOMYCIN SUBCONJUNCTIVAL INJECTION 25 MG/0.5 ML
25.0000 mg | Freq: Once | INTRAOCULAR | Status: DC
  Filled 2012-01-03: qty 0.5

## 2012-01-03 MED ORDER — POTASSIUM CHLORIDE CRYS ER 20 MEQ PO TBCR
40.0000 meq | EXTENDED_RELEASE_TABLET | Freq: Once | ORAL | Status: AC
  Administered 2012-01-03: 40 meq via ORAL
  Filled 2012-01-03: qty 2

## 2012-01-03 MED ORDER — FLUCONAZOLE IN SODIUM CHLORIDE 400-0.9 MG/200ML-% IV SOLN
800.0000 mg | Freq: Once | INTRAVENOUS | Status: AC
  Administered 2012-01-03: 800 mg via INTRAVENOUS
  Filled 2012-01-03: qty 400

## 2012-01-03 MED ORDER — VANCOMYCIN INTRAVITREAL INJECTION 1 MG/0.1 ML
0.1000 mg | Freq: Once | INTRAOCULAR | Status: DC
  Administered 2012-01-04: 0.1 mg via INTRAVITREAL
  Filled 2012-01-03: qty 0.1

## 2012-01-03 MED ORDER — FLUCONAZOLE IN SODIUM CHLORIDE 400-0.9 MG/200ML-% IV SOLN
400.0000 mg | INTRAVENOUS | Status: DC
  Administered 2012-01-04 – 2012-01-06 (×3): 400 mg via INTRAVENOUS
  Filled 2012-01-03 (×4): qty 200

## 2012-01-03 MED ORDER — PANTOPRAZOLE SODIUM 40 MG PO TBEC
40.0000 mg | DELAYED_RELEASE_TABLET | Freq: Two times a day (BID) | ORAL | Status: DC
  Administered 2012-01-03 – 2012-01-19 (×31): 40 mg via ORAL
  Filled 2012-01-03 (×31): qty 1

## 2012-01-03 MED ORDER — METHYLPREDNISOLONE SODIUM SUCC 125 MG IJ SOLR
80.0000 mg | Freq: Two times a day (BID) | INTRAMUSCULAR | Status: DC
  Administered 2012-01-03: 80 mg via INTRAVENOUS

## 2012-01-03 MED ORDER — CEFTAZIDIME SUBCONJUCTIVAL INJECTION 100 MG/0.5 ML
100.0000 mg | Freq: Once | SUBCONJUNCTIVAL | Status: DC
  Filled 2012-01-03: qty 0.5

## 2012-01-03 MED ORDER — CEFTAZIDIME INTRAVITREAL INJECTION 2.25 MG/0.1 ML
2.2500 mg | Freq: Once | INTRAVITREAL | Status: DC
  Filled 2012-01-03: qty 0.1

## 2012-01-03 NOTE — Progress Notes (Signed)
Subjective: Patient seen and examined this morning. Her left eye appears worse with swelling of the orbit and eyelid. MRI orbit results reviewed.  Objective:  Vital signs in last 24 hours:  Filed Vitals:   01/02/12 1500 01/02/12 2120 01/03/12 0515 01/03/12 1340  BP: 130/70 126/63 134/66 122/58  Pulse: 80 80 78 80  Temp: 98.1 F (36.7 C) 98.1 F (36.7 C) 98.3 F (36.8 C) 98.2 F (36.8 C)  TempSrc: Oral Oral Oral Oral  Resp: 18 17 17 18   Height:      Weight:   62.6 kg (138 lb 0.1 oz)   SpO2: 91% 93% 92% 99%    Intake/Output from previous day:   Intake/Output Summary (Last 24 hours) at 01/03/12 1652 Last data filed at 01/03/12 0615  Gross per 24 hour  Intake 2818.75 ml  Output    550 ml  Net 2268.75 ml    Physical Exam:   General: elderly thin built female in NAD  HEENT: significant swelling of left orbit and eyelid and unable to open the eye.  no pallor, no icterus, moist oral mucosa, no JVD, no lymphadenopathy. Herpetic lesions over rt lower lip  Heart: Normal s1 &s2 Regular rate and rhythm, without murmurs, rubs, gallops.  Lungs: crackles over left side  Abdomen: Soft, nontender, nondistended, positive bowel sounds.  Extremities: erythema with warmth and tenderness over left shoulder with pain on palpation and limited ROM, appears involving superficially skin and Ione tissue only, no joint tenderness, No clubbing cyanosis or edema with positive pedal pulses.  Neuro: Alert, awake, oriented x3, nonfocal.   Lab Results:  Basic Metabolic Panel:    Component Value Date/Time   NA 133* 01/03/2012 0624   K 3.3* 01/03/2012 0624   CL 101 01/03/2012 0624   CO2 23 01/03/2012 0624   BUN 8 01/03/2012 0624   CREATININE 0.55 01/03/2012 0624   GLUCOSE 88 01/03/2012 0624   CALCIUM 7.5* 01/03/2012 0624   CBC:    Component Value Date/Time   WBC 14.7* 01/03/2012 0624   HGB 7.6* 01/03/2012 0624   HCT 22.7* 01/03/2012 0624   PLT 492* 01/03/2012 0624   MCV 93.4 01/03/2012 0624   NEUTROABS 15.1*  12/31/2011 1155   LYMPHSABS 0.8 12/31/2011 1155   MONOABS 0.5 12/31/2011 1155   EOSABS 0.0 12/31/2011 1155   BASOSABS 0.0 12/31/2011 1155    Recent Results (from the past 240 hour(s))  URINE CULTURE     Status: Normal   Collection Time   12/31/11  4:15 AM      Component Value Range Status Comment   Specimen Description URINE, CLEAN CATCH   Final    Special Requests NONE   Final    Culture  Setup Time [id number]   Final    Colony Count NO GROWTH   Final    Culture NO GROWTH   Final    Report Status 01/01/2012 FINAL   Final   CULTURE, BLOOD (ROUTINE X 2)     Status: Normal (Preliminary result)   Collection Time   12/31/11  8:42 AM      Component Value Range Status Comment   Specimen Description BLOOD LEFT ARM   Final    Special Requests BOTTLES DRAWN AEROBIC ONLY 10CC   Final    Culture  Setup Time [id number]   Final    Culture     Final    Value:        BLOOD CULTURE RECEIVED NO GROWTH TO DATE CULTURE WILL BE  HELD FOR 5 DAYS BEFORE ISSUING A FINAL NEGATIVE REPORT   Report Status PENDING   Incomplete   CULTURE, BLOOD (ROUTINE X 2)     Status: Normal (Preliminary result)   Collection Time   12/31/11  8:48 AM      Component Value Range Status Comment   Specimen Description BLOOD LEFT HAND   Final    Special Requests BOTTLES DRAWN AEROBIC ONLY 10CC   Final    Culture  Setup Time [id number]   Final    Culture     Final    Value:        BLOOD CULTURE RECEIVED NO GROWTH TO DATE CULTURE WILL BE HELD FOR 5 DAYS BEFORE ISSUING A FINAL NEGATIVE REPORT   Report Status PENDING   Incomplete   CULTURE, SPUTUM-ASSESSMENT     Status: Normal   Collection Time   12/31/11  2:15 PM      Component Value Range Status Comment   Specimen Description SPUTUM   Final    Special Requests NONE   Final    Sputum evaluation     Final    Value: THIS SPECIMEN IS ACCEPTABLE. RESPIRATORY CULTURE REPORT TO FOLLOW.   Report Status 12/31/2011 FINAL   Final   CULTURE, RESPIRATORY     Status: Normal   Collection Time    12/31/11  2:15 PM      Component Value Range Status Comment   Specimen Description SPUTUM   Final    Special Requests NONE   Final    Gram Stain     Final    Value: NO WBC SEEN     NO SQUAMOUS EPITHELIAL CELLS SEEN     FEW YEAST   Culture NORMAL OROPHARYNGEAL FLORA   Final    Report Status 01/03/2012 FINAL   Final   STOOL CULTURE     Status: Normal (Preliminary result)   Collection Time   12/31/11  2:45 PM      Component Value Range Status Comment   Specimen Description STOOL   Final    Special Requests NONE   Final    Culture NO SUSPICIOUS COLONIES, CONTINUING TO HOLD   Final    Report Status PENDING   Incomplete   CLOSTRIDIUM DIFFICILE BY PCR     Status: Normal   Collection Time   01/02/12 11:29 AM      Component Value Range Status Comment   C difficile by pcr NEGATIVE  NEGATIVE  Final     Studies/Results: Mr [name] Wo Contrast  01/02/2012  *RADIOLOGY REPORT*  Clinical Data:  Swollen painful left eye with decreased vision. Left eye blindness.  MRI HEAD AND ORBITS WITHOUT AND WITH CONTRAST  Technique:  Multiplanar, multiecho pulse sequences of the brain and surrounding structures were obtained without and with intravenous contrast. Multiplanar, multiecho pulse sequences of the orbits and surrounding structures were obtained including fat saturation techniques, before and after intravenous contrast administration.  Contrast: 13 ml Multihance IV  Comparison:  CT 01/01/2012  MRI HEAD  Findings:  Negative for acute infarct.  No significant chronic ischemia.  Negative for intracranial mass or hemorrhage. Ventricles are normal in size.  Paranasal sinuses are clear.  Extensive inflammation is present in the left orbit with proptosis. There is edema of the sclera extending into the orbital fat.  See separate report of the orbit.  The diffusion weighted imaging is positive throughout the sclera suggesting acute infection.  Postcontrast imaging of the brain reveals normal enhancement.  No evidence of  brain abscess or meningeal thickening.  IMPRESSION: No significant intracranial infection.   Left orbital edema.  MRI ORBITS  Findings: Extensive orbital edema is present on the left.  There is proptosis which has progressed from the prior study yesterday. There is a thickened shaggy sclera on the left globe which enhances diffusely.  There is considerable enhancement surrounding the distal optic nerve and surrounding   intraconal orbital fat.  This has progressed considerably from the CT.  There is also edema and enhancement in the subcutaneous tissues of the eye lid and surrounding soft tissues.  Axial postcontrast images raise the possibility of a small abscess in the superior medial quadrant of the left orbit.  However, I believe this is some fat with surrounding inflammation based on the T2 and coronal T1  images.  I also considered  the possibility septic thrombophlebitis of the superior orbital vein however I do not think this is present at this time.  The cavernous sinus appears symmetric and normal bilaterally.  No significant sinusitis is present.  The right orbit is normal.  IMPRESSION: Progression of left orbital edema since yesterday.  There is thickening with shaggy enhancement of the sclera of the eye on the left.  There is enhancement in the intraconal orbital fat especially around the optic nerve.  No definite abscess is identified at this time.  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  I discussed the findings by telephone with Dr. Daiva Eves on 01/02/2012 at 1500 hours.  I have been unable to reach Dr. Randon Goldsmith who is not on call.  Original Report Authenticated By: Camelia Phenes, M.D.   Dg Shoulder Left  01/02/2012  *RADIOLOGY REPORT*  Clinical Data: Left shoulder pain for 5 days.  No known injury.  LEFT SHOULDER - 2+ VIEW  Comparison: Chest radiographs 12/30/2011.  Chest CT 12/31/2011.  Findings: The mineralization and alignment are normal.  There is no evidence of acute fracture or  dislocation.  Mild acromioclavicular degenerative changes appear stable.  The subacromial space is preserved.  The left upper lobe air space disease has mildly improved compared with the recent studies.  On review of the CT performed 2 days ago, I note there is a nondisplaced fracture of the left 12th rib posteriorly.  This was not described on that report but does not have an acute appearance.  IMPRESSION:  1.  No acute shoulder abnormalities identified.  There are mild acromioclavicular degenerative changes.  2.  Improving left upper lobe air space disease consistent with improving pneumonia.  This may contribute to the patient's shoulder pain.  3.  Nondisplaced subacute fracture of the left 12th rib (on recent CT).  Original Report Authenticated By: Gerrianne Scale, M.D.   Mr Orbits Wo/w Cm  01/02/2012  *RADIOLOGY REPORT*  Clinical Data:  Swollen painful left eye with decreased vision. Left eye blindness.  MRI HEAD AND ORBITS WITHOUT AND WITH CONTRAST  Technique:  Multiplanar, multiecho pulse sequences of the brain and surrounding structures were obtained without and with intravenous contrast. Multiplanar, multiecho pulse sequences of the orbits and surrounding structures were obtained including fat saturation techniques, before and after intravenous contrast administration.  Contrast: 13 ml Multihance IV  Comparison:  CT 01/01/2012  MRI HEAD  Findings:  Negative for acute infarct.  No significant chronic ischemia.  Negative for intracranial mass or hemorrhage. Ventricles are normal in size.  Paranasal sinuses are clear.  Extensive inflammation is present in the left orbit with proptosis. There is  edema of the sclera extending into the orbital fat.  See separate report of the orbit.  The diffusion weighted imaging is positive throughout the sclera suggesting acute infection.  Postcontrast imaging of the brain reveals normal enhancement.  No evidence of brain abscess or meningeal thickening.  IMPRESSION: No  significant intracranial infection.   Left orbital edema.  MRI ORBITS  Findings: Extensive orbital edema is present on the left.  There is proptosis which has progressed from the prior study yesterday. There is a thickened shaggy sclera on the left globe which enhances diffusely.  There is considerable enhancement surrounding the distal optic nerve and surrounding   intraconal orbital fat.  This has progressed considerably from the CT.  There is also edema and enhancement in the subcutaneous tissues of the eye lid and surrounding soft tissues.  Axial postcontrast images raise the possibility of a small abscess in the superior medial quadrant of the left orbit.  However, I believe this is some fat with surrounding inflammation based on the T2 and coronal T1  images.  I also considered  the possibility septic thrombophlebitis of the superior orbital vein however I do not think this is present at this time.  The cavernous sinus appears symmetric and normal bilaterally.  No significant sinusitis is present.  The right orbit is normal.  IMPRESSION: Progression of left orbital edema since yesterday.  There is thickening with shaggy enhancement of the sclera of the eye on the left.  There is enhancement in the intraconal orbital fat especially around the optic nerve.  No definite abscess is identified at this time.  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  I discussed the findings by telephone with Dr. Daiva Eves on 01/02/2012 at 1500 hours.  I have been unable to reach Dr. Randon Goldsmith who is not on call.  Original Report Authenticated By: Camelia Phenes, M.D.    Medications: Scheduled Meds:   . bacitracin-polymyxin b   Left Eye QID  . cefTAZidime (FORTAZ)  IV  1 g Intravenous Q8H  . cefTAZidime  100 mg Subconjunctival Once  . cefTAZidime  2.25 mg Intravitreal Once  . feeding supplement  237 mL Oral BID  . gatifloxacin  1 drop Left Eye Q2H while awake  . methylPREDNISolone (SOLU-MEDROL) injection  80 mg  Intravenous Q12H  . moxifloxacin  400 mg Intravenous Q24H  . pantoprazole  40 mg Oral BID AC  . sodium chloride  3 mL Intravenous Q12H  . vancomycin  750 mg Intravenous Q12H  . vancomycin  0.1 mg Intravitreal Once  . vancomycin  25 mg Subconjunctival Once  . DISCONTD: ceFEPime (MAXIPIME) IV  2 g Intravenous Q8H  . DISCONTD: ceFEPime (MAXIPIME) IV  2 g Intravenous Q8H  . DISCONTD: vancomycin  750 mg Intravenous Q12H   Continuous Infusions:   . sodium chloride 75 mL/hr at 01/03/12 1531   PRN Meds:.acetaminophen, acetaminophen, fentaNYL, HYDROcodone-acetaminophen, ondansetron (ZOFRAN) IV, ondansetron  Assessment/Plan:  Active Problems:  Dehydration  Renal failure, acute  Uveitis, anterior  Nausea & vomiting  Lobar pneumonia due to unspecified organism    LOS: 4 days   Donna Tapia 01/03/2012, 4:52 PM   Objective:  Vital signs in last 24 hours:  Filed Vitals:   01/02/12 1500 01/02/12 2120 01/03/12 0515 01/03/12 1340  BP: 130/70 126/63 134/66 122/58  Pulse: 80 80 78 80  Temp: 98.1 F (36.7 C) 98.1 F (36.7 C) 98.3 F (36.8 C) 98.2 F (36.8 C)  TempSrc: Oral Oral Oral Oral  Resp: 18  17 17 18   Height:      Weight:   62.6 kg (138 lb 0.1 oz)   SpO2: 91% 93% 92% 99%    Intake/Output from previous day:   Intake/Output Summary (Last 24 hours) at 01/03/12 1652 Last data filed at 01/03/12 0615  Gross per 24 hour  Intake 2818.75 ml  Output    550 ml  Net 2268.75 ml    Physical Exam:  General: , in no acute distress. HEENT: no pallor, no icterus, moist oral mucosa, no JVD, no lymphadenopathy Heart: Normal  s1 &s2  Regular rate and rhythm, without murmurs, rubs, gallops. Lungs: Clear to auscultation bilaterally. Abdomen: Soft, nontender, nondistended, positive bowel sounds. Extremities: No clubbing cyanosis or edema with positive pedal pulses. Neuro: Alert, awake, oriented x3, nonfocal.   Lab Results:  Basic Metabolic Panel:    Component Value Date/Time    NA 133* 01/03/2012 0624   K 3.3* 01/03/2012 0624   CL 101 01/03/2012 0624   CO2 23 01/03/2012 0624   BUN 8 01/03/2012 0624   CREATININE 0.55 01/03/2012 0624   GLUCOSE 88 01/03/2012 0624   CALCIUM 7.5* 01/03/2012 0624   CBC:    Component Value Date/Time   WBC 14.7* 01/03/2012 0624   HGB 7.6* 01/03/2012 0624   HCT 22.7* 01/03/2012 0624   PLT 492* 01/03/2012 0624   MCV 93.4 01/03/2012 0624   NEUTROABS 15.1* 12/31/2011 1155   LYMPHSABS 0.8 12/31/2011 1155   MONOABS 0.5 12/31/2011 1155   EOSABS 0.0 12/31/2011 1155   BASOSABS 0.0 12/31/2011 1155    Recent Results (from the past 240 hour(s))  URINE CULTURE     Status: Normal   Collection Time   12/31/11  4:15 AM      Component Value Range Status Comment   Specimen Description URINE, CLEAN CATCH   Final    Special Requests NONE   Final    Culture  Setup Time [id number]   Final    Colony Count NO GROWTH   Final    Culture NO GROWTH   Final    Report Status 01/01/2012 FINAL   Final   CULTURE, BLOOD (ROUTINE X 2)     Status: Normal (Preliminary result)   Collection Time   12/31/11  8:42 AM      Component Value Range Status Comment   Specimen Description BLOOD LEFT ARM   Final    Special Requests BOTTLES DRAWN AEROBIC ONLY 10CC   Final    Culture  Setup Time [id number]   Final    Culture     Final    Value:        BLOOD CULTURE RECEIVED NO GROWTH TO DATE CULTURE WILL BE HELD FOR 5 DAYS BEFORE ISSUING A FINAL NEGATIVE REPORT   Report Status PENDING   Incomplete   CULTURE, BLOOD (ROUTINE X 2)     Status: Normal (Preliminary result)   Collection Time   12/31/11  8:48 AM      Component Value Range Status Comment   Specimen Description BLOOD LEFT HAND   Final    Special Requests BOTTLES DRAWN AEROBIC ONLY 10CC   Final    Culture  Setup Time [id number]   Final    Culture     Final    Value:        BLOOD CULTURE RECEIVED NO GROWTH TO DATE CULTURE WILL BE HELD FOR 5 DAYS BEFORE ISSUING A FINAL NEGATIVE REPORT   Report Status PENDING   Incomplete  CULTURE,  SPUTUM-ASSESSMENT     Status: Normal   Collection Time   12/31/11  2:15 PM      Component Value Range Status Comment   Specimen Description SPUTUM   Final    Special Requests NONE   Final    Sputum evaluation     Final    Value: THIS SPECIMEN IS ACCEPTABLE. RESPIRATORY CULTURE REPORT TO FOLLOW.   Report Status 12/31/2011 FINAL   Final   CULTURE, RESPIRATORY     Status: Normal   Collection Time   12/31/11  2:15 PM      Component Value Range Status Comment   Specimen Description SPUTUM   Final    Special Requests NONE   Final    Gram Stain     Final    Value: NO WBC SEEN     NO SQUAMOUS EPITHELIAL CELLS SEEN     FEW YEAST   Culture NORMAL OROPHARYNGEAL FLORA   Final    Report Status 01/03/2012 FINAL   Final   STOOL CULTURE     Status: Normal (Preliminary result)   Collection Time   12/31/11  2:45 PM      Component Value Range Status Comment   Specimen Description STOOL   Final    Special Requests NONE   Final    Culture NO SUSPICIOUS COLONIES, CONTINUING TO HOLD   Final    Report Status PENDING   Incomplete   CLOSTRIDIUM DIFFICILE BY PCR     Status: Normal   Collection Time   01/02/12 11:29 AM      Component Value Range Status Comment   C difficile by pcr NEGATIVE  NEGATIVE  Final     Studies/Results: Mr [name] Wo Contrast  01/02/2012  *RADIOLOGY REPORT*  Clinical Data:  Swollen painful left eye with decreased vision. Left eye blindness.  MRI HEAD AND ORBITS WITHOUT AND WITH CONTRAST  Technique:  Multiplanar, multiecho pulse sequences of the brain and surrounding structures were obtained without and with intravenous contrast. Multiplanar, multiecho pulse sequences of the orbits and surrounding structures were obtained including fat saturation techniques, before and after intravenous contrast administration.  Contrast: 13 ml Multihance IV  Comparison:  CT 01/01/2012  MRI HEAD  Findings:  Negative for acute infarct.  No significant chronic ischemia.  Negative for intracranial mass or  hemorrhage. Ventricles are normal in size.  Paranasal sinuses are clear.  Extensive inflammation is present in the left orbit with proptosis. There is edema of the sclera extending into the orbital fat.  See separate report of the orbit.  The diffusion weighted imaging is positive throughout the sclera suggesting acute infection.  Postcontrast imaging of the brain reveals normal enhancement.  No evidence of brain abscess or meningeal thickening.  IMPRESSION: No significant intracranial infection.   Left orbital edema.  MRI ORBITS  Findings: Extensive orbital edema is present on the left.  There is proptosis which has progressed from the prior study yesterday. There is a thickened shaggy sclera on the left globe which enhances diffusely.  There is considerable enhancement surrounding the distal optic nerve and surrounding   intraconal orbital fat.  This has progressed considerably from the CT.  There is also edema and enhancement in the subcutaneous tissues of the eye lid and surrounding soft tissues.  Axial postcontrast images raise the possibility of a small abscess in the superior medial quadrant of the left orbit.  However, I believe this is some fat with surrounding inflammation based on the  T2 and coronal T1  images.  I also considered  the possibility septic thrombophlebitis of the superior orbital vein however I do not think this is present at this time.  The cavernous sinus appears symmetric and normal bilaterally.  No significant sinusitis is present.  The right orbit is normal.  IMPRESSION: Progression of left orbital edema since yesterday.  There is thickening with shaggy enhancement of the sclera of the eye on the left.  There is enhancement in the intraconal orbital fat especially around the optic nerve.  No definite abscess is identified at this time.  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  I discussed the findings by telephone with Dr. Daiva Eves on 01/02/2012 at 1500 hours.  I have been unable  to reach Dr. Randon Goldsmith who is not on call.  Original Report Authenticated By: Camelia Phenes, M.D.   Dg Shoulder Left  01/02/2012  *RADIOLOGY REPORT*  Clinical Data: Left shoulder pain for 5 days.  No known injury.  LEFT SHOULDER - 2+ VIEW  Comparison: Chest radiographs 12/30/2011.  Chest CT 12/31/2011.  Findings: The mineralization and alignment are normal.  There is no evidence of acute fracture or dislocation.  Mild acromioclavicular degenerative changes appear stable.  The subacromial space is preserved.  The left upper lobe air space disease has mildly improved compared with the recent studies.  On review of the CT performed 2 days ago, I note there is a nondisplaced fracture of the left 12th rib posteriorly.  This was not described on that report but does not have an acute appearance.  IMPRESSION:  1.  No acute shoulder abnormalities identified.  There are mild acromioclavicular degenerative changes.  2.  Improving left upper lobe air space disease consistent with improving pneumonia.  This may contribute to the patient's shoulder pain.  3.  Nondisplaced subacute fracture of the left 12th rib (on recent CT).  Original Report Authenticated By: Gerrianne Scale, M.D.   Mr Orbits Wo/w Cm  01/02/2012  *RADIOLOGY REPORT*  Clinical Data:  Swollen painful left eye with decreased vision. Left eye blindness.  MRI HEAD AND ORBITS WITHOUT AND WITH CONTRAST  Technique:  Multiplanar, multiecho pulse sequences of the brain and surrounding structures were obtained without and with intravenous contrast. Multiplanar, multiecho pulse sequences of the orbits and surrounding structures were obtained including fat saturation techniques, before and after intravenous contrast administration.  Contrast: 13 ml Multihance IV  Comparison:  CT 01/01/2012  MRI HEAD  Findings:  Negative for acute infarct.  No significant chronic ischemia.  Negative for intracranial mass or hemorrhage. Ventricles are normal in size.  Paranasal sinuses are  clear.  Extensive inflammation is present in the left orbit with proptosis. There is edema of the sclera extending into the orbital fat.  See separate report of the orbit.  The diffusion weighted imaging is positive throughout the sclera suggesting acute infection.  Postcontrast imaging of the brain reveals normal enhancement.  No evidence of brain abscess or meningeal thickening.  IMPRESSION: No significant intracranial infection.   Left orbital edema.  MRI ORBITS  Findings: Extensive orbital edema is present on the left.  There is proptosis which has progressed from the prior study yesterday. There is a thickened shaggy sclera on the left globe which enhances diffusely.  There is considerable enhancement surrounding the distal optic nerve and surrounding   intraconal orbital fat.  This has progressed considerably from the CT.  There is also edema and enhancement in the subcutaneous tissues of the eye  lid and surrounding soft tissues.  Axial postcontrast images raise the possibility of a small abscess in the superior medial quadrant of the left orbit.  However, I believe this is some fat with surrounding inflammation based on the T2 and coronal T1  images.  I also considered  the possibility septic thrombophlebitis of the superior orbital vein however I do not think this is present at this time.  The cavernous sinus appears symmetric and normal bilaterally.  No significant sinusitis is present.  The right orbit is normal.  IMPRESSION: Progression of left orbital edema since yesterday.  There is thickening with shaggy enhancement of the sclera of the eye on the left.  There is enhancement in the intraconal orbital fat especially around the optic nerve.  No definite abscess is identified at this time.  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  I discussed the findings by telephone with Dr. Daiva Eves on 01/02/2012 at 1500 hours.  I have been unable to reach Dr. Randon Goldsmith who is not on call.  Original Report  Authenticated By: Camelia Phenes, M.D.    Medications: Scheduled Meds:   . bacitracin-polymyxin b   Left Eye QID  . cefTAZidime (FORTAZ)  IV  1 g Intravenous Q8H  . cefTAZidime  100 mg Subconjunctival Once  . cefTAZidime  2.25 mg Intravitreal Once  . feeding supplement  237 mL Oral BID  . gatifloxacin  1 drop Left Eye Q2H while awake  . methylPREDNISolone (SOLU-MEDROL) injection  80 mg Intravenous Q12H  . moxifloxacin  400 mg Intravenous Q24H  . pantoprazole  40 mg Oral BID AC  . sodium chloride  3 mL Intravenous Q12H  . vancomycin  750 mg Intravenous Q12H  . vancomycin  0.1 mg Intravitreal Once  . vancomycin  25 mg Subconjunctival Once  . DISCONTD: ceFEPime (MAXIPIME) IV  2 g Intravenous Q8H  . DISCONTD: ceFEPime (MAXIPIME) IV  2 g Intravenous Q8H  . DISCONTD: vancomycin  750 mg Intravenous Q12H   Continuous Infusions:   . sodium chloride 75 mL/hr at 01/03/12 1531   PRN Meds:.acetaminophen, acetaminophen, fentaNYL, HYDROcodone-acetaminophen, ondansetron (ZOFRAN) IV, ondansetron  Assessment:  83 female with chr low back pain and hx of hep B presents with 2 days hx of painless left eye blindness with hx of nausea. Vomiting an diarrhea 1 week back with findings of left anterior uveitis and multilobar left sided pneumonia on CT scan.  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 She does not  Have  retinal vision so no wants to hold off on surgery for now  and monitor  She now has a severe left shoulder pain with minimal swelling warmth and erythema. Which appears  more superficial on exam. X ray unremarkable and no effusion Autoimmune w/up including HLA ag, hepatitis panel pending  Started on IV solumedrol as per pulm ? ,  Cont topical abx ( ointment over left eye ) Xray SI jt unremarkable  Elevated alk P noted, CMV PCR, toxoplasma ag negative, RPR negative, HIV, bartonella , hepatitis panel in progress  ACE level normal  Discussed with Dr Domingo Pulse from rheum ,  agrees to follow up in clinic after discharge if autoimmune w/up positive ( she does not have insurance so this might not be possible)   Left sided multilobar PNA  Started on avelox And will continue ( day 5)  sputum cx no growth so far Swallow eval to r/o aspiration  No temp spike in 48 hrs  Cultures so far negative    Anemia  Iron deficiency noted on iron panel  b12 and folate wnl  Stool for occult blood negative  Check tsh  Will monitor closely, hold off on trasnfusion For now until acute issues resolve  will need GI eval once acute issue resolved   Herpes labialis  Seen by ID consults and recommends starting valtrex which seems to have been discontinued .   Diarrhea  Prior to admission c diff negative  resolved   DVT prophylaxis: SCD  Full code       LOS: 4 days   Donna Tapia 01/03/2012, 4:52 PM

## 2012-01-03 NOTE — Consult Note (Addendum)
Donna Tapia           MR [id number]                                                                              01/03/2012                                               Ophthalmology Evaluation                                          Consult Requested by: Dr. Antony Contras Consultation requested for: [name] HPI: 5 day history of decreased vision OS in association with back pain, leucocytosis, ling infiltrates on CXR. 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 Anemia of unknown origin present.  ON IV broad spectrum coverage currently inclusive of Vanco and Ceftazidime.  Pertinent Medical History: Active Ambulatory Problems    Diagnosis Date Noted  . No Active Ambulatory Problems   Resolved Ambulatory Problems    Diagnosis Date Noted  . No Resolved Ambulatory Problems   Past Medical History  Diagnosis Date  . Hepatitis B     Pertinent Ophthalmic History:  None previously prior to this event  Current Eye Medications: Zymaxid,, bacitracin -polymyxin B Ointment  Medications Prior to Admission  Medication Dose Route Frequency Provider Last Rate Last Dose  . 0.9 %  sodium chloride infusion   Intravenous Continuous [name], MD 75 mL/hr at 01/03/12 1531    . acetaminophen (TYLENOL) tablet 650 mg  650 mg Oral Q6H PRN [name], MD   650 mg at 12/31/11 2234   Or  . acetaminophen (TYLENOL) suppository 650 mg  650 mg Rectal Q6H PRN [name], MD      . acetaminophen (TYLENOL) tablet 650 mg  650 mg Oral Once [name], MD   650 mg at 12/30/11 1559  . apraclonidine (IOPIDINE) 1 % ophthalmic solution 1 drop  1 drop Left Eye To Minor [name], MD   1 drop at 12/30/11 1534  . bacitracin-polymyxin b (POLYSPORIN) ophthalmic ointment   Left Eye QID Antony Contras, MD      . cefTAZidime (FORTAZ) 1 g in dextrose 5 % 50 mL IVPB  1 g Intravenous Q8H [name], MD   1 g at 01/03/12 1542  . cefTAZidime (FORTAZ)  ophth injection 100 mg  100 mg Subconjunctival Once [name], MD      . cefTAZidime Elita Quick) ophth injection 2.25 mg  2.25 mg Intravitreal Once [name], MD      . feeding supplement (ENSURE CLINICAL STRENGTH) liquid 237 mL  237 mL Oral BID [name], RD   237 mL at 01/03/12 1533  . fentaNYL (SUBLIMAZE) injection 25 mcg  25 mcg Intravenous Q3H PRN [name], MD   25 mcg at 01/03/12 1233  . fentaNYL (SUBLIMAZE) injection 50 mcg  50 mcg Intravenous Once [name], MD   50 mcg  at 12/30/11 1639  . fentaNYL (SUBLIMAZE) injection 50 mcg  50 mcg Intravenous Once [name], MD   50 mcg at 12/30/11 2040  . fluorescein ophthalmic strip 1 strip  1 strip Both Eyes Once [name], MD   1 strip at 12/30/11 1500  . gadobenate dimeglumine (MULTIHANCE) injection 13 mL  13 mL Intravenous Once PRN Medication Radiologist, MD   13 mL at 01/02/12 1646  . gatifloxacin (ZYMAXID) 0.5 % ophthalmic drops 1 drop  1 drop Left Eye Q2H while awake Antony Contras, MD   1 drop at 01/03/12 1241  . HYDROcodone-acetaminophen (NORCO) 10-325 MG per tablet 1 tablet  1 tablet Oral Q4H PRN [name], MD   1 tablet at 01/02/12 2034  . iohexol (OMNIPAQUE) 300 MG/ML solution 20 mL  20 mL Oral Q1 Hr x 2 Medication Radiologist, MD   20 mL at 12/30/11 1848  . iohexol (OMNIPAQUE) 300 MG/ML solution 70 mL  70 mL Intravenous Once PRN Medication Radiologist, MD   70 mL at 12/31/11 1730  . iohexol (OMNIPAQUE) 300 MG/ML solution 75 mL  75 mL Intravenous Once PRN Medication Radiologist, MD   75 mL at 12/30/11 1856  . methylPREDNISolone sodium succinate (SOLU-MEDROL) 125 MG injection 80 mg  80 mg Intravenous Q12H Sandrea Hughs, MD   80 mg at 01/03/12 1217  . moxifloxacin (AVELOX) IVPB 400 mg  400 mg Intravenous Q24H [name], MD   400 mg at 01/03/12 0045  . ondansetron (ZOFRAN) injection 4 mg  4 mg Intravenous Once [name], MD   4 mg at 12/30/11 1429  . ondansetron (ZOFRAN) tablet 4  mg  4 mg Oral Q6H PRN [name], MD   4 mg at 01/02/12 2034   Or  . ondansetron [facility]) injection 4 mg  4 mg Intravenous Q6H PRN [name], MD   4 mg at 01/03/12 0845  . pantoprazole (PROTONIX) EC tablet 40 mg  40 mg Oral BID AC Sandrea Hughs, MD      . potassium chloride SA (K-DUR,KLOR-CON) CR tablet 40 mEq  40 mEq Oral Once [name], MD   40 mEq at 01/01/12 1001  . sodium chloride 0.9 % bolus 500 mL  500 mL Intravenous Once [name], MD   500 mL at 12/30/11 1429  . sodium chloride 0.9 % injection 3 mL  3 mL Intravenous Q12H [name], MD   3 mL at 01/03/12 1058  . tetracaine (PONTOCAINE) 0.5 % ophthalmic solution 2 drop  2 drop Both Eyes Once [name], MD   2 drop at 12/30/11 1502  . timolol (TIMOPTIC) 0.5 % ophthalmic solution 1 drop  1 drop Left Eye Once [name], MD   1 drop at 12/30/11 1600  . tuberculin injection 5 Units  5 Units Intradermal Once Antony Contras, MD   5 Units at 12/30/11 2044  . valACYclovir (VALTREX) tablet 2,000 mg  2,000 mg Oral BID [name], MD   2,000 mg at 12/31/11 2235  . vancomycin (VANCOCIN) 750 mg in sodium chloride 0.9 % 150 mL IVPB  750 mg Intravenous Q12H [name], MD   750 mg at 01/03/12 0507  . vancomycin (VANCOCIN) ophth injection 0.1 mg  0.1 mg Intravitreal Once [name], MD      . vancomycin [facility]) ophth injection 25 mg  25 mg Subconjunctival Once [name], MD      . white petrolatum (VASELINE) gel        1  application at 01/02/12 0945  . DISCONTD: acyclovir ointment (ZOVIRAX) 5 %   Topical Q3H [name], MD      . DISCONTD: bacitracin-polymyxin b (POLYSPORIN) ophthalmic ointment   Left Eye BID [name], MD      . DISCONTD: ceFEPIme (MAXIPIME) 2 g in dextrose 5 % 50 mL IVPB  2 g Intravenous Q8H [name], MD      . DISCONTD: ceFEPIme (MAXIPIME) 2 g in dextrose 5 % 50 mL IVPB  2 g Intravenous Q8H [name], MD      . DISCONTD: prednisoLONE acetate  (PRED FORTE) 1 % ophthalmic suspension 1 drop  1 drop Left Eye Q1H while awake Antony Contras, MD   1 drop at 01/02/12 1233  . DISCONTD: scopolamine (HYOSCINE) 0.25 % ophthalmic solution 1 drop  1 drop Left Eye BID Antony Contras, MD   1 drop at 01/02/12 1143  . DISCONTD: vancomycin (VANCOCIN) 750 mg in sodium chloride 0.9 % 150 mL IVPB  750 mg Intravenous Q12H [name], MD   750 mg at 01/02/12 1708   No current outpatient prescriptions on file as of 01/03/2012.    Ophthalmic ROS: blurred vision, foreign body sensation, eye pain, tearing, red eye and discharge    Confrontation Visual Fields:   OD: full, normal    OS: No perception of light on today's exam   Pupils:                                                 Motility OD:   Full in all fields of gaze  OD: PERRLA  OS: Fixed, 4mm                       OS:  Fixed with poor movement                                                              Proptotic OS VA:          Near acuity:  cc OD  20/20  J1  Near card   cc  OS NLP   Intraocular Pressure:     [ x ]  Deferred                                               [ x ]  Deferred                                        Dilation: [x]  deferred   Lids/Lashes:     OD:  Normal    OS:  erythema and lid crusting, edematous  Conjunctiva:    OD: within normal limits    OS: bacterial conjuntivitis, mucoid discharge and chemosis   Sclera:    OD: normal      OS: unable to evaluate due to extreme chemosis    Cornea:    OD: normal   OS:  abrasion, descemet's folds  Anterior Chamber:    OD: deep & clear         OS: deep, flare, cell, hypopion and fibrin           3+ deep           2+ cells           3+ flare           1+ fibrin Iris:    OD: normal     OS: normal    Lens:    OD: 2+ nuclear sclerosis        OS: 2+ nuclear sclerosis        Vitreous:   OD: clear     OS: exudate/infiltrate   Optic Nerve:   OD: C/D: 0.35  X 0. 35           healthy rim             OS: No  view  Peripheral Retina:    OD: no focal pathology   OS: no view  Macula: OD: WNL OS: No View  Retina Vessels: OD: WNL OS: No view                   BScan Ultrasound:  Total Retinal Detachment,  Vitreous organization OS  Impression:   Acute endogenous Endophthalmitis of unknown source. Clinically does not look fungal.  Given multiple lung lesions and other pain complaints be on high alert for other sites of  involvement.  Evaluating immune status may be beneficial as well.  Discussion: Given no perception of light at this time and her debilitated state, I advise continuing with medical management of the primary problem and continuing to identify a source.  Recommendations/Plan: I wish to defer surgical intervention for now until she is more medically stable and feel her systemic infection is under control. Vision in th left eye has a poor prognosis. We will follow along with you.  Planned studies/Lab testing:  Vitreous and Acqueous samples were taken hand carried to the lab for cultures. Bacterial only given scant samples.  Procedures:  Provided a bedside vitreous tap with 5cc syringe and 25g 5/8 inch after retrobulbar block with 3cc 2% lidocaine. Tapped the [facility] for 0.2cc with 25g 5/8 inch needle and a TB syringe for acqueous.  Gave intravitreal Vancomycin, 1mg  in 0.1 cc, Intravitreal Ceftazidime 2.25mg  in 0.1 cc. Periocular injection of Vancomycin 25mg  and Ceftazidime 100mg  was performed as well. Topical Bacitracin/Polymyxin Ointment was applied to the chemotic, exposed conjunctiva at the conclusion.  The patient tolerated the procedure well and there were no complications.  [name] MD   Dr Antony Contras and I will follow along with you.  [name], MD

## 2012-01-03 NOTE — Progress Notes (Signed)
PHARMACIST - PHYSICIAN COMMUNICATION CONCERNING:  Moxifloxacin and Fluconazole coadministration  DESCRIPTION & RECOMMENDATION: Potentially major interaction between moxifloxacin and fluconazole which can result in QTc-prolongation. Given other antibiotics ordered for patient, Dr. Ninetta Lights (Infectious disease) recommends discontinuing Moxifloxacin at this time to avoid interaction. Order has been discontinued.  Benjaman Pott, PharmD     Pager [phone] 01/03/2012   7:20 PM

## 2012-01-03 NOTE — Progress Notes (Signed)
Subjective:  Pulmonary Consult f/u HPI-History of Present Illness: 61 yo WF , never smoker, reports 2 weeks of nasal drainage, coughing with brown sputum, fever and increased weakness. She woke up 2/28 blind in her left eye and went to [facility] and dx of anterior uveitis rendered by Ophthalmology. It was recommended that systemic causes of uveitis be excluded. A CXR was performed revealing finding CW pneumonia and Pulm Med is asked to evaluate further. 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   3/3 Spoke with ophthalmologist concerning  ? Panophthalmitis with planned bx of left eye.     Objective: Vital signs in last 24 hours: Temp:  [98.1 F (36.7 C)-98.3 F (36.8 C)] 98.3 F (36.8 C) (03/04 0515) Pulse Rate:  [78-80] 78  (03/04 0515) Resp:  [17-18] 17  (03/04 0515) BP: (126-134)/(63-70) 134/66 mmHg (03/04 0515) SpO2:  [91 %-93 %] 92 % (03/04 0515) Weight:  [138 lb 0.1 oz (62.6 kg)] 138 lb 0.1 oz (62.6 kg) (03/04 0515)  Physical Examination:  General:  61 yo wf. Calm and oriented Neuro: Left blindness, lethargic but no overt deficits. Holds L eye closed. Increased swelling with closure of eye.  HEENT: No jvd. Lt eye as noted. Mucosa dry, no thrush Cardiovascular: hsr rrr no murmur Lungs: Normal on R. Posterior crackles in L mid lung field. Prominent egophony anteriorly on L , no rub. Not coughing, unlabored. Abdomen: + bs  Musculoskeletal: Tender to pressure left shoulder, w/o obvious effusion. No crepitus and mobility seems normal Skin: viral fever blisters lower lip, pink over left shoulder  Lab Results:  [facility] 01/03/12 0624 01/02/12 0845  WBC 14.7* 16.5*  HGB 7.6* 7.3*  HCT 22.7* 21.2*  PLT 492* 432*   BMET  Basename 01/03/12 0624 01/02/12 0845  NA 133* 132*  K 3.3* 3.5  CL 101 100  CO2 23 23  GLUCOSE 88 96  BUN 8 9  CREATININE 0.55 0.55  CALCIUM 7.5* 7.5*    Studies/Results: Mr [name] WJ Contrast  01/02/2012   *RADIOLOGY REPORT*  Clinical Data:  Swollen painful left eye with decreased vision. Left eye blindness.  MRI HEAD AND ORBITS WITHOUT AND WITH CONTRAST  Technique:  Multiplanar, multiecho pulse sequences of the brain and surrounding structures were obtained without and with intravenous contrast. Multiplanar, multiecho pulse sequences of the orbits and surrounding structures were obtained including fat saturation techniques, before and after intravenous contrast administration.  Contrast: 13 ml Multihance IV  Comparison:  CT 01/01/2012  MRI HEAD  Findings:  Negative for acute infarct.  No significant chronic ischemia.  Negative for intracranial mass or hemorrhage. Ventricles are normal in size.  Paranasal sinuses are clear.  Extensive inflammation is present in the left orbit with proptosis. There is edema of the sclera extending into the orbital fat.  See separate report of the orbit.  The diffusion weighted imaging is positive throughout the sclera suggesting acute infection.  Postcontrast imaging of the brain reveals normal enhancement.  No evidence of brain abscess or meningeal thickening.  IMPRESSION: No significant intracranial infection.   Left orbital edema.  MRI ORBITS  Findings: Extensive orbital edema is present on the left.  There is proptosis which has progressed from the prior study yesterday. There is a thickened shaggy sclera on the left globe which enhances diffusely.  There is considerable enhancement surrounding the distal optic nerve and surrounding   intraconal orbital fat.  This has progressed considerably from the CT.  There is also edema and enhancement in the subcutaneous tissues of the eye lid and surrounding soft tissues.  Axial postcontrast images raise the possibility of a small abscess in the superior medial quadrant of the left orbit.  However, I believe this is some fat with surrounding inflammation based on the T2 and coronal T1  images.  I also considered  the possibility septic  thrombophlebitis of the superior orbital vein however I do not think this is present at this time.  The cavernous sinus appears symmetric and normal bilaterally.  No significant sinusitis is present.  The right orbit is normal.  IMPRESSION: Progression of left orbital edema since yesterday.  There is thickening with shaggy enhancement of the sclera of the eye on the left.  There is enhancement in the intraconal orbital fat especially around the optic nerve.  No definite abscess is identified at this time.  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  I discussed the findings by telephone with Dr. Daiva Eves on 01/02/2012 at 1500 hours.  I have been unable to reach Dr. Randon Goldsmith who is not on call.  Original Report Authenticated By: Camelia Phenes, M.D.   Dg Shoulder Left  01/02/2012  *RADIOLOGY REPORT*  Clinical Data: Left shoulder pain for 5 days.  No known injury.  LEFT SHOULDER - 2+ VIEW  Comparison: Chest radiographs 12/30/2011.  Chest CT 12/31/2011.  Findings: The mineralization and alignment are normal.  There is no evidence of acute fracture or dislocation.  Mild acromioclavicular degenerative changes appear stable.  The subacromial space is preserved.  The left upper lobe air space disease has mildly improved compared with the recent studies.  On review of the CT performed 2 days ago, I note there is a nondisplaced fracture of the left 12th rib posteriorly.  This was not described on that report but does not have an acute appearance.  IMPRESSION:  1.  No acute shoulder abnormalities identified.  There are mild acromioclavicular degenerative changes.  2.  Improving left upper lobe air space disease consistent with improving pneumonia.  This may contribute to the patient's shoulder pain.  3.  Nondisplaced subacute fracture of the left 12th rib (on recent CT).  Original Report Authenticated By: Gerrianne Scale, M.D.   Ct Maxillofacial Wo Cm  01/01/2012  *RADIOLOGY REPORT*  Clinical Data: Left eye swollen with  drainage.  Headache.  CT MAXILLOFACIAL WITHOUT CONTRAST  Technique:  Multidetector CT imaging of the maxillofacial structures was performed. Multiplanar CT image reconstructions were also generated.  Comparison: None.  Findings: Soft tissue swelling of the left eye lid.  The globe is normal.  No edema or abscess is present in the orbit.  Findings are most compatible with superficial cellulitis.  Frontal sinuses are hypoplastic.  Paranasal sinuses are   clear without evidence of sinusitis.  No acute bony changes.  IMPRESSION: Superficial cellulitis of the eye lid on the left.  Negative for orbital cellulitis or abscess.  Original Report Authenticated By: Camelia Phenes, M.D.   Mr Orbits Wo/w Cm  01/02/2012  *RADIOLOGY REPORT*  Clinical Data:  Swollen painful left eye with decreased vision. Left eye blindness.  MRI HEAD AND ORBITS WITHOUT AND WITH CONTRAST  Technique:  Multiplanar, multiecho pulse sequences of the brain and surrounding structures were obtained without and with intravenous contrast. Multiplanar, multiecho pulse sequences of the orbits and surrounding structures were obtained including fat saturation techniques, before and after intravenous contrast administration.  Contrast: 13 ml Multihance IV  Comparison:  CT  01/01/2012  MRI HEAD  Findings:  Negative for acute infarct.  No significant chronic ischemia.  Negative for intracranial mass or hemorrhage. Ventricles are normal in size.  Paranasal sinuses are clear.  Extensive inflammation is present in the left orbit with proptosis. There is edema of the sclera extending into the orbital fat.  See separate report of the orbit.  The diffusion weighted imaging is positive throughout the sclera suggesting acute infection.  Postcontrast imaging of the brain reveals normal enhancement.  No evidence of brain abscess or meningeal thickening.  IMPRESSION: No significant intracranial infection.   Left orbital edema.  MRI ORBITS  Findings: Extensive orbital edema  is present on the left.  There is proptosis which has progressed from the prior study yesterday. There is a thickened shaggy sclera on the left globe which enhances diffusely.  There is considerable enhancement surrounding the distal optic nerve and surrounding   intraconal orbital fat.  This has progressed considerably from the CT.  There is also edema and enhancement in the subcutaneous tissues of the eye lid and surrounding soft tissues.  Axial postcontrast images raise the possibility of a small abscess in the superior medial quadrant of the left orbit.  However, I believe this is some fat with surrounding inflammation based on the T2 and coronal T1  images.  I also considered  the possibility septic thrombophlebitis of the superior orbital vein however I do not think this is present at this time.  The cavernous sinus appears symmetric and normal bilaterally.  No significant sinusitis is present.  The right orbit is normal.  IMPRESSION: Progression of left orbital edema since yesterday.  There is thickening with shaggy enhancement of the sclera of the eye on the left.  There is enhancement in the intraconal orbital fat especially around the optic nerve.  No definite abscess is identified at this time.  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  I discussed the findings by telephone with Dr. Daiva Eves on 01/02/2012 at 1500 hours.  I have been unable to reach Dr. Randon Goldsmith who is not on call.  Original Report Authenticated By: Camelia Phenes, M.D.    Medications: {I have reviewed meds.  Assessment/Plan: 1) Multilobar pneumonia- Avelox #3 with addition of fortaz, vancomycin due to presumed opthalmic infection and with ID running Abx. I have reviewed images. - Cultures neg so far. ID seeing. CXR pattern is too consolidated for usual viral pneumonia. If cultures remain neg tomorrow, then consider if bronch for additional material would be helpful.  -Recommend f/u CXR , Watch need for (humidified) O2 2)Anterior  uveitis ?Panophthalmitis  .- CT sinuses neg, and read as superficial cellulitis of eyelid. It looks worse today. -FOR BX PER SUGERGY 3) left shoulder pain-k opth for f/ soft tissue- ? Early shingles on valacyclovir Recommend- Continue Avelox, leave cup for additional sputum cx,   4) Mucositis- xerostomia. Pharmacy only has Biotene. Can try topical vaseline- ordered 5)Low back pain/ sciatica?- suspect this is separate, related to bed confinement. Pain control, heating pad, position mobilization may help.  LOS: 4 days   This is a duplicate, see attending note same day  Sandrea Hughs, MD Pulmonary and Critical Care Medicine [facility] Cell [phone]

## 2012-01-03 NOTE — Progress Notes (Signed)
Name: Donna Tapia MRN: [id number] DOB: [birth year]    LOS: 4 Requesting MD: Zipporah Plants PCCM    Brief patient profile: 61 yowf  never smoker, reports 2 weeks of nasal drainage, coughing with brown sputum, fever and increased weakness. 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 It was recommended that systemic causes of uveitis be excluded. A CXR was performed revealing finding CW pneumonia and Pulm Med requested.  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 Fm hx strongly POS RA      Cultures: 3/1 sputum > nl flora 3/1 bc x 2 >> 3/1 uc >> neg  Antibiotics per ID   Tests / Events: Eye bx 3/4 >> Solumedrol trial 3/4 >>>   Subjective: Worse aching all over  Vital Signs: Temp:  [98.1 F (36.7 C)-98.3 F (36.8 C)] 98.3 F (36.8 C) (03/04 0515) Pulse Rate:  [78-80] 78  (03/04 0515) Resp:  [17-18] 17  (03/04 0515) BP: (126-134)/(63-70) 134/66 mmHg (03/04 0515) SpO2:  [91 %-93 %] 92 % (03/04 0515) Weight:  [138 lb 0.1 oz (62.6 kg)] 138 lb 0.1 oz (62.6 kg) (03/04 0515) I/O last 3 completed shifts: In: 4853.8 [P.O.:1560; I.V.:2643.8; IV Piggyback:650] Out: 1575 [Urine:1575]  Physical Examination: General:  Sickly 61 yo wf. Neuro:  Left blindness, lethargic but no overt deficits.    HEENT:  No jvd. Lt eye as noted.  Cardiovascular:  hsr rrr Lungs:  Normal on R. Posterior crackles in L mid lung field. Prominent egophony anteriorly on L Abdomen:  + bs Musculoskeletal:  Intact, no edema Skin:  No lesions noted      Labs and Imaging:  CXR: LUL consolidation with prominent air bronchograms   Lab 01/03/12 0624 01/02/12 0845 01/01/12 0600  NA 133* 132* 136  K 3.3* 3.5 3.1*  CL 101 100 103  CO2 23 23 24   BUN 8 9 15   CREATININE 0.55 0.55 0.67  GLUCOSE 88 96 98    Lab 01/03/12 0624 01/02/12 0845 01/01/12 0600  HGB 7.6* 7.3* 7.7*  HCT 22.7* 21.2* 23.1*  WBC  14.7* 16.5* 16.9*  PLT 492* 432* 380    Assessment and Plan: Abnormal CXR in setting of acute anterior uveitis: The radiographic appearance and clinical history are most suggestive of bacterial PNA. Sarcoidosis would be the most common pulmonary dx associated with uveitis. Her CXR is not typical of sarcoidosis, however. Plan/Rec:  ABX per ID  Steroids started 3/4 for ? boop   Uveitis  -Mgmt per Opthalmology    Sandrea Hughs, MD Pulmonary and Critical Care Medicine [facility] Cell [phone]

## 2012-01-03 NOTE — Progress Notes (Signed)
Pt PPD negative read on 01/01/12 at approximately 2045 Dahlia Byes [facility]

## 2012-01-03 NOTE — Progress Notes (Signed)
MEDICAL STUDENT PROGRESS NOTE  ID daily preogress note  Subjective:  Pt is in acute pain. Complaining of worsening dehydration and her mouth feels like 'sandpaper'. She is still suffering from severe pain in her shoulders, sacral area and lower extremities.  Objective: Vital signs in last 24 hours: Temp:  [98.1 F (36.7 C)-98.3 F (36.8 C)] 98.3 F (36.8 C) (03/04 0515) Pulse Rate:  [78-80] 78  (03/04 0515) Resp:  [17-18] 17  (03/04 0515) BP: (126-134)/(63-70) 134/66 mmHg (03/04 0515) SpO2:  [91 %-93 %] 92 % (03/04 0515) Weight:  [62.6 kg (138 lb 0.1 oz)] 62.6 kg (138 lb 0.1 oz) (03/04 0515)  Intake/Output from previous day: 03/03 0701 - 03/04 0700 In: 3298.8 [P.O.:1080; I.V.:1818.8; IV Piggyback:400] Out: 950 [Urine:950] Intake/Output this shift:   Pt is alert and communicative. HEENT: dry oropharynx and mucus membranes Eyes: Left eye has severe swelling erythema and tenderness. Mild left eye protrusion. Normal right eye. CV: RRR, tachycardia with a 2/6 harsh systolic murmur on the RUSB and LUSB. LUNGS: difficult to ascultate, no rhonchi or wheezing MUSC: shoulder, back and LE tenderness, decreased shoulder ROM  Lab Results  Basename 01/03/12 0624 01/02/12 0845  WBC 14.7* 16.5*  HGB 7.6* 7.3*  HCT 22.7* 21.2*  NA 133* 132*  K 3.3* 3.5  CL 101 100  CO2 23 23  BUN 8 9  CREATININE 0.55 0.55  GLU -- --    Microbiology: Recent Results (from the past 240 hour(s))  URINE CULTURE     Status: Normal   Collection Time   12/31/11  4:15 AM      Component Value Range Status Comment   Specimen Description URINE, CLEAN CATCH   Final    Special Requests NONE   Final    Culture  Setup Time [id number]   Final    Colony Count NO GROWTH   Final    Culture NO GROWTH   Final    Report Status 01/01/2012 FINAL   Final   CULTURE, BLOOD (ROUTINE X 2)     Status: Normal (Preliminary result)   Collection Time   12/31/11  8:42 AM      Component Value Range Status Comment   Specimen  Description BLOOD LEFT ARM   Final    Special Requests BOTTLES DRAWN AEROBIC ONLY 10CC   Final    Culture  Setup Time [id number]   Final    Culture     Final    Value:        BLOOD CULTURE RECEIVED NO GROWTH TO DATE CULTURE WILL BE HELD FOR 5 DAYS BEFORE ISSUING A FINAL NEGATIVE REPORT   Report Status PENDING   Incomplete   CULTURE, BLOOD (ROUTINE X 2)     Status: Normal (Preliminary result)   Collection Time   12/31/11  8:48 AM      Component Value Range Status Comment   Specimen Description BLOOD LEFT HAND   Final    Special Requests BOTTLES DRAWN AEROBIC ONLY 10CC   Final    Culture  Setup Time [id number]   Final    Culture     Final    Value:        BLOOD CULTURE RECEIVED NO GROWTH TO DATE CULTURE WILL BE HELD FOR 5 DAYS BEFORE ISSUING A FINAL NEGATIVE REPORT   Report Status PENDING   Incomplete   CULTURE, SPUTUM-ASSESSMENT     Status: Normal   Collection Time   12/31/11  2:15 PM  Component Value Range Status Comment   Specimen Description SPUTUM   Final    Special Requests NONE   Final    Sputum evaluation     Final    Value: THIS SPECIMEN IS ACCEPTABLE. RESPIRATORY CULTURE REPORT TO FOLLOW.   Report Status 12/31/2011 FINAL   Final   CULTURE, RESPIRATORY     Status: Normal   Collection Time   12/31/11  2:15 PM      Component Value Range Status Comment   Specimen Description SPUTUM   Final    Special Requests NONE   Final    Gram Stain     Final    Value: NO WBC SEEN     NO SQUAMOUS EPITHELIAL CELLS SEEN     FEW YEAST   Culture NORMAL OROPHARYNGEAL FLORA   Final    Report Status 01/03/2012 FINAL   Final   STOOL CULTURE     Status: Normal (Preliminary result)   Collection Time   12/31/11  2:45 PM      Component Value Range Status Comment   Specimen Description STOOL   Final    Special Requests NONE   Final    Culture NO SUSPICIOUS COLONIES, CONTINUING TO HOLD   Final    Report Status PENDING   Incomplete   CLOSTRIDIUM DIFFICILE BY PCR     Status: Normal   Collection  Time   01/02/12 11:29 AM      Component Value Range Status Comment   C difficile by pcr NEGATIVE  NEGATIVE  Final    Negative c-anca, ACE eznyme, ANA, RPR  Ucx and Bcx show no growth to date.   UA shows the presence of hyaline casts, few squamous epithelial cells.  PRO BNP is elevated at 2107, echocardiogram show normal LV function with EF 60-65%  Testing for borrelia, CMV, HIV, HepC, syphilis is negative  Hep B panel and HLA B27 testing are pending.  Studies/Results: Mr [name] WU Contrast  01/02/2012  *RADIOLOGY REPORT*  Clinical Data:  Swollen painful left eye with decreased vision. Left eye blindness.  MRI HEAD AND ORBITS WITHOUT AND WITH CONTRAST  Technique:  Multiplanar, multiecho pulse sequences of the brain and surrounding structures were obtained without and with intravenous contrast. Multiplanar, multiecho pulse sequences of the orbits and surrounding structures were obtained including fat saturation techniques, before and after intravenous contrast administration.  Contrast: 13 ml Multihance IV  Comparison:  CT 01/01/2012  MRI HEAD  Findings:  Negative for acute infarct.  No significant chronic ischemia.  Negative for intracranial mass or hemorrhage. Ventricles are normal in size.  Paranasal sinuses are clear.  Extensive inflammation is present in the left orbit with proptosis. There is edema of the sclera extending into the orbital fat.  See separate report of the orbit.  The diffusion weighted imaging is positive throughout the sclera suggesting acute infection.  Postcontrast imaging of the brain reveals normal enhancement.  No evidence of brain abscess or meningeal thickening.  IMPRESSION: No significant intracranial infection.   Left orbital edema.  MRI ORBITS  Findings: Extensive orbital edema is present on the left.  There is proptosis which has progressed from the prior study yesterday. There is a thickened shaggy sclera on the left globe which enhances diffusely.  There is  considerable enhancement surrounding the distal optic nerve and surrounding   intraconal orbital fat.  This has progressed considerably from the CT.  There is also edema and enhancement in the subcutaneous tissues of the  eye lid and surrounding soft tissues.  Axial postcontrast images raise the possibility of a small abscess in the superior medial quadrant of the left orbit.  However, I believe this is some fat with surrounding inflammation based on the T2 and coronal T1  images.  I also considered  the possibility septic thrombophlebitis of the superior orbital vein however I do not think this is present at this time.  The cavernous sinus appears symmetric and normal bilaterally.  No significant sinusitis is present.  The right orbit is normal.  IMPRESSION: Progression of left orbital edema since yesterday.  There is thickening with shaggy enhancement of the sclera of the eye on the left.  There is enhancement in the intraconal orbital fat especially around the optic nerve.  No definite abscess is identified at this time.  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  I discussed the findings by telephone with Dr. Daiva Eves on 01/02/2012 at 1500 hours.  I have been unable to reach Dr. Randon Goldsmith who is not on call.  Original Report Authenticated By: Camelia Phenes, M.D.   Dg Shoulder Left  01/02/2012  *RADIOLOGY REPORT*  Clinical Data: Left shoulder pain for 5 days.  No known injury.  LEFT SHOULDER - 2+ VIEW  Comparison: Chest radiographs 12/30/2011.  Chest CT 12/31/2011.  Findings: The mineralization and alignment are normal.  There is no evidence of acute fracture or dislocation.  Mild acromioclavicular degenerative changes appear stable.  The subacromial space is preserved.  The left upper lobe air space disease has mildly improved compared with the recent studies.  On review of the CT performed 2 days ago, I note there is a nondisplaced fracture of the left 12th rib posteriorly.  This was not described on that  report but does not have an acute appearance.  IMPRESSION:  1.  No acute shoulder abnormalities identified.  There are mild acromioclavicular degenerative changes.  2.  Improving left upper lobe air space disease consistent with improving pneumonia.  This may contribute to the patient's shoulder pain.  3.  Nondisplaced subacute fracture of the left 12th rib (on recent CT).  Original Report Authenticated By: Gerrianne Scale, M.D.   Mr Orbits Wo/w Cm  01/02/2012  *RADIOLOGY REPORT*  Clinical Data:  Swollen painful left eye with decreased vision. Left eye blindness.  MRI HEAD AND ORBITS WITHOUT AND WITH CONTRAST  Technique:  Multiplanar, multiecho pulse sequences of the brain and surrounding structures were obtained without and with intravenous contrast. Multiplanar, multiecho pulse sequences of the orbits and surrounding structures were obtained including fat saturation techniques, before and after intravenous contrast administration.  Contrast: 13 ml Multihance IV  Comparison:  CT 01/01/2012  MRI HEAD  Findings:  Negative for acute infarct.  No significant chronic ischemia.  Negative for intracranial mass or hemorrhage. Ventricles are normal in size.  Paranasal sinuses are clear.  Extensive inflammation is present in the left orbit with proptosis. There is edema of the sclera extending into the orbital fat.  See separate report of the orbit.  The diffusion weighted imaging is positive throughout the sclera suggesting acute infection.  Postcontrast imaging of the brain reveals normal enhancement.  No evidence of brain abscess or meningeal thickening.  IMPRESSION: No significant intracranial infection.   Left orbital edema.  MRI ORBITS  Findings: Extensive orbital edema is present on the left.  There is proptosis which has progressed from the prior study yesterday. There is a thickened shaggy sclera on the left globe which enhances diffusely.  There is considerable enhancement surrounding the distal optic nerve and  surrounding   intraconal orbital fat.  This has progressed considerably from the CT.  There is also edema and enhancement in the subcutaneous tissues of the eye lid and surrounding soft tissues.  Axial postcontrast images raise the possibility of a small abscess in the superior medial quadrant of the left orbit.  However, I believe this is some fat with surrounding inflammation based on the T2 and coronal T1  images.  I also considered  the possibility septic thrombophlebitis of the superior orbital vein however I do not think this is present at this time.  The cavernous sinus appears symmetric and normal bilaterally.  No significant sinusitis is present.  The right orbit is normal.  IMPRESSION: Progression of left orbital edema since yesterday.  There is thickening with shaggy enhancement of the sclera of the eye on the left.  There is enhancement in the intraconal orbital fat especially around the optic nerve.  No definite abscess is identified at this time.  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  I discussed the findings by telephone with Dr. Daiva Eves on 01/02/2012 at 1500 hours.  I have been unable to reach Dr. Randon Goldsmith who is not on call.  Original Report Authenticated By: Camelia Phenes, M.D.   Eye US shows severe diffuse inflammation of the left eye and orbit (panophthalmitis).   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 Treated empirically with broad coverage antibiotic regimen with bacitracin-polymyxin B ointment, gatifloxacin ophthalmic drops, ceftazidime subc and intravitreal injections and vancomycin sub and intravitreal injections. Will obtain eye fluid sample for a more accurate microbiologic diagnosis.   2.Left sided multilobar PNA : Afebrile,  Will cycle BCx and Ucx. No growth to date. Continue vancomycin, ceftazidime, vancomycin. Sputum gram stain and culture no growth, will need to  exclude TB, sarcoidosis or fungal infection. -   3. Dehydration. Normal volume status, renal failure seems to have resolved with cr now at 0.55. Will continue NaCl 0,9% infusion at 75/min and monitor fluid status  4. Arthritis: On tylenol, fentanyl and hydrocodone/acetaminophen with minimal relief. X rays have been within normal limits.   5. Anemia: low Fe with high ferritin, possibly anemia of chronic disease, related to the chronic underlying inflammatory condition. B12 and folate normal.   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    Christopulos, Georgios 01/03/2012, 1:39 PM  Patient seen and agree with above.  Cultures remain negative.

## 2012-01-03 NOTE — Progress Notes (Signed)
PT Cancellation Note  Treatment cancelled today due to patient politely declining PT. Patient expressed anxiety about possible biopsy this afternoon.   Fredrich Birks 01/03/2012, 12:19 PM 01/03/2012 Fredrich Birks PTA [phone] pager [phone] office

## 2012-01-03 NOTE — Progress Notes (Addendum)
INFECTIOUS DISEASE PROGRESS NOTE  ID: Donna Tapia is a 61 y.o. female with endophthalmitis.  Subjective: Continued pain, worsening, no vision.  Abtx:  Anti-infectives     Start     Dose/Rate Route Frequency Ordered Stop   01/03/12 1245   cefTAZidime (FORTAZ) ophth injection 2.25 mg        2.25 mg Intravitreal  Once 01/03/12 1135     01/03/12 1245   cefTAZidime (FORTAZ) ophth injection 100 mg        100 mg Subconjunctival  Once 01/03/12 1135     01/03/12 1200   vancomycin (VANCOCIN) ophth injection 0.1 mg        0.1 mg Intravitreal  Once 01/03/12 1130     01/03/12 1200   vancomycin (VANCOCIN) ophth injection 25 mg        25 mg Subconjunctival  Once 01/03/12 1131     01/03/12 0500   vancomycin (VANCOCIN) 750 mg in sodium chloride 0.9 % 150 mL IVPB        750 mg 150 mL/hr over 60 Minutes Intravenous Every 12 hours 01/02/12 1716     01/03/12 0200   ceFEPIme (MAXIPIME) 2 g in dextrose 5 % 50 mL IVPB  Status:  Discontinued        2 g 100 mL/hr over 30 Minutes Intravenous Every 8 hours 01/02/12 1716 01/02/12 2041   01/02/12 2200   cefTAZidime (FORTAZ) 1 g in dextrose 5 % 50 mL IVPB        1 g 100 mL/hr over 30 Minutes Intravenous 3 times per day 01/02/12 2106     01/02/12 1400   ceFEPIme (MAXIPIME) 2 g in dextrose 5 % 50 mL IVPB  Status:  Discontinued        2 g 100 mL/hr over 30 Minutes Intravenous 3 times per day 01/02/12 1318 01/02/12 1716   01/02/12 1400   vancomycin (VANCOCIN) 750 mg in sodium chloride 0.9 % 150 mL IVPB  Status:  Discontinued        750 mg 150 mL/hr over 60 Minutes Intravenous Every 12 hours 01/02/12 1350 01/02/12 1716   12/31/11 1530   valACYclovir (VALTREX) tablet 2,000 mg        2,000 mg Oral 2 times daily 12/31/11 1358 12/31/11 2235   12/31/11 0100   moxifloxacin (AVELOX) IVPB 400 mg        400 mg 250 mL/hr over 60 Minutes Intravenous Every 24 hours 12/31/11 0040            Medications: I have reviewed the patient's current  medications.  Objective: Vital signs in last 24 hours: Temp:  [98.1 F (36.7 C)-98.3 F (36.8 C)] 98.2 F (36.8 C) (03/04 1340) Pulse Rate:  [78-80] 80  (03/04 1340) Resp:  [17-18] 18  (03/04 1340) BP: (122-134)/(58-66) 122/58 mmHg (03/04 1340) SpO2:  [92 %-99 %] 99 % (03/04 1340) Weight:  [138 lb 0.1 oz (62.6 kg)] 138 lb 0.1 oz (62.6 kg) (03/04 0515)   General appearance: alert and no distress Resp: clear to auscultation bilaterally Cardio: regular rate and rhythm, S1, S2 normal, no murmur, click, rub or gallop  Lab Results  Basename 01/03/12 0624 01/02/12 0845  WBC 14.7* 16.5*  HGB 7.6* 7.3*  HCT 22.7* 21.2*  NA 133* 132*  K 3.3* 3.5  CL 101 100  CO2 23 23  BUN 8 9  CREATININE 0.55 0.55  GLU -- --   Liver Panel No results found for this basename: PROT:2,ALBUMIN:2,AST:2,ALT:2,ALKPHOS:2,BILITOT:2,BILIDIR:2,IBILI:2 in  the last 72 hours Sedimentation Rate No results found for this basename: ESRSEDRATE in the last 72 hours C-Reactive Protein No results found for this basename: CRP:2 in the last 72 hours  Microbiology: Recent Results (from the past 240 hour(s))  URINE CULTURE     Status: Normal   Collection Time   12/31/11  4:15 AM      Component Value Range Status Comment   Specimen Description URINE, CLEAN CATCH   Final    Special Requests NONE   Final    Culture  Setup Time [id number]   Final    Colony Count NO GROWTH   Final    Culture NO GROWTH   Final    Report Status 01/01/2012 FINAL   Final   CULTURE, BLOOD (ROUTINE X 2)     Status: Normal (Preliminary result)   Collection Time   12/31/11  8:42 AM      Component Value Range Status Comment   Specimen Description BLOOD LEFT ARM   Final    Special Requests BOTTLES DRAWN AEROBIC ONLY 10CC   Final    Culture  Setup Time [id number]   Final    Culture     Final    Value:        BLOOD CULTURE RECEIVED NO GROWTH TO DATE CULTURE WILL BE HELD FOR 5 DAYS BEFORE ISSUING A FINAL NEGATIVE REPORT   Report Status  PENDING   Incomplete   CULTURE, BLOOD (ROUTINE X 2)     Status: Normal (Preliminary result)   Collection Time   12/31/11  8:48 AM      Component Value Range Status Comment   Specimen Description BLOOD LEFT HAND   Final    Special Requests BOTTLES DRAWN AEROBIC ONLY 10CC   Final    Culture  Setup Time [id number]   Final    Culture     Final    Value:        BLOOD CULTURE RECEIVED NO GROWTH TO DATE CULTURE WILL BE HELD FOR 5 DAYS BEFORE ISSUING A FINAL NEGATIVE REPORT   Report Status PENDING   Incomplete   CULTURE, SPUTUM-ASSESSMENT     Status: Normal   Collection Time   12/31/11  2:15 PM      Component Value Range Status Comment   Specimen Description SPUTUM   Final    Special Requests NONE   Final    Sputum evaluation     Final    Value: THIS SPECIMEN IS ACCEPTABLE. RESPIRATORY CULTURE REPORT TO FOLLOW.   Report Status 12/31/2011 FINAL   Final   CULTURE, RESPIRATORY     Status: Normal   Collection Time   12/31/11  2:15 PM      Component Value Range Status Comment   Specimen Description SPUTUM   Final    Special Requests NONE   Final    Gram Stain     Final    Value: NO WBC SEEN     NO SQUAMOUS EPITHELIAL CELLS SEEN     FEW YEAST   Culture NORMAL OROPHARYNGEAL FLORA   Final    Report Status 01/03/2012 FINAL   Final   STOOL CULTURE     Status: Normal (Preliminary result)   Collection Time   12/31/11  2:45 PM      Component Value Range Status Comment   Specimen Description STOOL   Final    Special Requests NONE   Final    Culture NO SUSPICIOUS COLONIES, CONTINUING TO HOLD  Final    Report Status PENDING   Incomplete   CLOSTRIDIUM DIFFICILE BY PCR     Status: Normal   Collection Time   01/02/12 11:29 AM      Component Value Range Status Comment   C difficile by pcr NEGATIVE  NEGATIVE  Final     Studies/Results: Mr Donna Tapia Wo Contrast  01/02/2012  *RADIOLOGY REPORT*  Clinical Data:  Swollen painful left eye with decreased vision. Left eye blindness.  MRI HEAD AND ORBITS WITHOUT  AND WITH CONTRAST  Technique:  Multiplanar, multiecho pulse sequences of the brain and surrounding structures were obtained without and with intravenous contrast. Multiplanar, multiecho pulse sequences of the orbits and surrounding structures were obtained including fat saturation techniques, before and after intravenous contrast administration.  Contrast: 13 ml Multihance IV  Comparison:  CT 01/01/2012  MRI HEAD  Findings:  Negative for acute infarct.  No significant chronic ischemia.  Negative for intracranial mass or hemorrhage. Ventricles are normal in size.  Paranasal sinuses are clear.  Extensive inflammation is present in the left orbit with proptosis. There is edema of the sclera extending into the orbital fat.  See separate report of the orbit.  The diffusion weighted imaging is positive throughout the sclera suggesting acute infection.  Postcontrast imaging of the brain reveals normal enhancement.  No evidence of brain abscess or meningeal thickening.  IMPRESSION: No significant intracranial infection.   Left orbital edema.  MRI ORBITS  Findings: Extensive orbital edema is present on the left.  There is proptosis which has progressed from the prior study yesterday. There is a thickened shaggy sclera on the left globe which enhances diffusely.  There is considerable enhancement surrounding the distal optic nerve and surrounding   intraconal orbital fat.  This has progressed considerably from the CT.  There is also edema and enhancement in the subcutaneous tissues of the eye lid and surrounding soft tissues.  Axial postcontrast images raise the possibility of a small abscess in the superior medial quadrant of the left orbit.  However, I believe this is some fat with surrounding inflammation based on the T2 and coronal T1  images.  I also considered  the possibility septic thrombophlebitis of the superior orbital vein however I do not think this is present at this time.  The cavernous sinus appears symmetric  and normal bilaterally.  No significant sinusitis is present.  The right orbit is normal.  IMPRESSION: Progression of left orbital edema since yesterday.  There is thickening with shaggy enhancement of the sclera of the eye on the left.  There is enhancement in the intraconal orbital fat especially around the optic nerve.  No definite abscess is identified at this time.  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  I discussed the findings by telephone with Dr. Daiva Eves on 01/02/2012 at 1500 hours.  I have been unable to reach Dr. Randon Goldsmith who is not on call.  Original Report Authenticated By: Camelia Phenes, M.D.   Dg Shoulder Left  01/02/2012  *RADIOLOGY REPORT*  Clinical Data: Left shoulder pain for 5 days.  No known injury.  LEFT SHOULDER - 2+ VIEW  Comparison: Chest radiographs 12/30/2011.  Chest CT 12/31/2011.  Findings: The mineralization and alignment are normal.  There is no evidence of acute fracture or dislocation.  Mild acromioclavicular degenerative changes appear stable.  The subacromial space is preserved.  The left upper lobe air space disease has mildly improved compared with the recent studies.  On review of the CT  performed 2 days ago, I note there is a nondisplaced fracture of the left 12th rib posteriorly.  This was not described on that report but does not have an acute appearance.  IMPRESSION:  1.  No acute shoulder abnormalities identified.  There are mild acromioclavicular degenerative changes.  2.  Improving left upper lobe air space disease consistent with improving pneumonia.  This may contribute to the patient's shoulder pain.  3.  Nondisplaced subacute fracture of the left 12th rib (on recent CT).  Original Report Authenticated By: Gerrianne Scale, M.D.   Mr Orbits Wo/w Cm  01/02/2012  *RADIOLOGY REPORT*  Clinical Data:  Swollen painful left eye with decreased vision. Left eye blindness.  MRI HEAD AND ORBITS WITHOUT AND WITH CONTRAST  Technique:  Multiplanar, multiecho pulse  sequences of the brain and surrounding structures were obtained without and with intravenous contrast. Multiplanar, multiecho pulse sequences of the orbits and surrounding structures were obtained including fat saturation techniques, before and after intravenous contrast administration.  Contrast: 13 ml Multihance IV  Comparison:  CT 01/01/2012  MRI HEAD  Findings:  Negative for acute infarct.  No significant chronic ischemia.  Negative for intracranial mass or hemorrhage. Ventricles are normal in size.  Paranasal sinuses are clear.  Extensive inflammation is present in the left orbit with proptosis. There is edema of the sclera extending into the orbital fat.  See separate report of the orbit.  The diffusion weighted imaging is positive throughout the sclera suggesting acute infection.  Postcontrast imaging of the brain reveals normal enhancement.  No evidence of brain abscess or meningeal thickening.  IMPRESSION: No significant intracranial infection.   Left orbital edema.  MRI ORBITS  Findings: Extensive orbital edema is present on the left.  There is proptosis which has progressed from the prior study yesterday. There is a thickened shaggy sclera on the left globe which enhances diffusely.  There is considerable enhancement surrounding the distal optic nerve and surrounding   intraconal orbital fat.  This has progressed considerably from the CT.  There is also edema and enhancement in the subcutaneous tissues of the eye lid and surrounding soft tissues.  Axial postcontrast images raise the possibility of a small abscess in the superior medial quadrant of the left orbit.  However, I believe this is some fat with surrounding inflammation based on the T2 and coronal T1  images.  I also considered  the possibility septic thrombophlebitis of the superior orbital vein however I do not think this is present at this time.  The cavernous sinus appears symmetric and normal bilaterally.  No significant sinusitis is  present.  The right orbit is normal.  IMPRESSION: Progression of left orbital edema since yesterday.  There is thickening with shaggy enhancement of the sclera of the eye on the left.  There is enhancement in the intraconal orbital fat especially around the optic nerve.  No definite abscess is identified at this time.  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  I discussed the findings by telephone with Dr. Daiva Eves on 01/02/2012 at 1500 hours.  I have been unable to reach Dr. Randon Goldsmith who is not on call.  Original Report Authenticated By: Camelia Phenes, M.D.     Assessment/Plan: 1) endophthalmitis - Discussed with ophthalmology, with endophthalmitis, would be from another source.  Most likely source is the lungs with the abnormal findings.  Potentially an induced sputum could help ID the organism.  No other strong evidence of a source but certainly an epidural abscess  or endocarditis is still possible.  I have ordered an MRI of spine and a TEE to be done.  She should continue with broad spectrum antibiotics with vanco trough goal of 15-20 and ceftazidime.  Fungal is also a possibility, though very unlikely but will add fluconazole for now.  Will also reculture.  I also will d/c the solumedrol as this has no current role.  This plan was discussed with Dr. Deatra James of ophthalmology.    Tamsen Reist Infectious Diseases 01/03/2012, 6:15 PM

## 2012-01-03 NOTE — Progress Notes (Signed)
Patient ID: ALEATHIA PURDY, female   DOB: 1950-11-29, 61 y.o.   MRN: [id number]  Interval Progress note / addendum to yesterday's note:   *  See Dr. Anne Hahn note for interval history and exam (reviewed and agree). Reviewed labs and micro data; cultures with no growth.   A/P:   -- Endogenous endophthalmitis / panophthalmitis OS,  with undetermined primary source of infection.    Endogenous endophthalmitis is an end organ manifestation of a blood stream infection.  My concern remains the source of the blood stream infection.  Her community acquired pneumonia may be the source; however, I think we are obligated to r/o other possible sources given the severity, rapid progression, and atypical presentation of her endophthalmitis.  I've discussed this in detail with her primary team, Dr. Clarisa Kindred and I.D.   I.D. will assist in further work up including TEE and MRI of spine (back pain).     All of the above was discussed with the patient and her friend from church who was with her at that time.  Their questions were answered to their satisfaction.   Shauntay Brunelli 01/03/2012, 5:53 PM  [facility] [phone]

## 2012-01-04 ENCOUNTER — Inpatient Hospital Stay (HOSPITAL_COMMUNITY): Payer: Medicaid Other

## 2012-01-04 ENCOUNTER — Encounter (HOSPITAL_COMMUNITY)
Admission: EM | Disposition: A | Discharge status: Home Health | Payer: Self-pay | Source: Home / Self Care | Attending: Internal Medicine

## 2012-01-04 ENCOUNTER — Inpatient Hospital Stay (HOSPITAL_COMMUNITY): Payer: Medicaid Other | Admitting: Anesthesiology

## 2012-01-04 ENCOUNTER — Encounter (HOSPITAL_COMMUNITY): Payer: Self-pay | Admitting: Anesthesiology

## 2012-01-04 DIAGNOSIS — J13 Pneumonia due to Streptococcus pneumoniae: Secondary | ICD-10-CM

## 2012-01-04 DIAGNOSIS — J9600 Acute respiratory failure, unspecified whether with hypoxia or hypercapnia: Secondary | ICD-10-CM

## 2012-01-04 HISTORY — PX: LUMBAR LAMINECTOMY/DECOMPRESSION MICRODISCECTOMY: SHX5026

## 2012-01-04 LAB — EXPECTORATED SPUTUM ASSESSMENT W GRAM STAIN, RFLX TO RESP C

## 2012-01-04 LAB — BASIC METABOLIC PANEL
Chloride: 104 mEq/L (ref 96–112)
Creatinine, Ser: 0.57 mg/dL (ref 0.50–1.10)
GFR calc Af Amer: >90 mL/min (ref >90)
Potassium: 3.8 mEq/L (ref 3.5–5.1)
Sodium: 133 mEq/L — ABNORMAL LOW (ref 135–145)

## 2012-01-04 LAB — HIV-1 RNA QUANT-NO REFLEX-BLD
HIV 1 RNA Quant: <20 copies/mL (ref <20)
HIV-1 RNA Quant, Log: <1.3 {Log} (ref <1.30)

## 2012-01-04 LAB — PREPARE RBC (CROSSMATCH)

## 2012-01-04 LAB — STOOL CULTURE

## 2012-01-04 LAB — CBC
MCV: 93.3 fL (ref 78.0–100.0)
Platelets: 576 10*3/uL — ABNORMAL HIGH (ref 150–400)
RDW: 15.1 % (ref 11.5–15.5)
WBC: 13.1 10*3/uL — ABNORMAL HIGH (ref 4.0–10.5)

## 2012-01-04 LAB — GRAM STAIN

## 2012-01-04 SURGERY — LUMBAR LAMINECTOMY/DECOMPRESSION MICRODISCECTOMY 1 LEVEL
Anesthesia: General | Site: Back | Wound class: Dirty or Infected

## 2012-01-04 MED ORDER — GADOBENATE DIMEGLUMINE 529 MG/ML IV SOLN
13.0000 mL | Freq: Once | INTRAVENOUS | Status: AC
  Administered 2012-01-04: 13 mL via INTRAVENOUS

## 2012-01-04 MED ORDER — LIDOCAINE HCL (CARDIAC) 20 MG/ML IV SOLN: INTRAVENOUS | Status: DC | PRN

## 2012-01-04 MED ORDER — METOCLOPRAMIDE HCL 5 MG/ML IJ SOLN
10.0000 mg | Freq: Once | INTRAMUSCULAR | Status: AC | PRN
  Filled 2012-01-04: qty 2

## 2012-01-04 MED ORDER — HYDROMORPHONE HCL PF 1 MG/ML IJ SOLN
INTRAMUSCULAR | Status: AC
  Filled 2012-01-04: qty 1

## 2012-01-04 MED ORDER — FENTANYL CITRATE 0.05 MG/ML IJ SOLN
INTRAMUSCULAR | Status: DC | PRN
  Administered 2012-01-04 (×5): 50 ug via INTRAVENOUS

## 2012-01-04 MED ORDER — HEMOSTATIC AGENTS (NO CHARGE) OPTIME
TOPICAL | Status: DC | PRN
  Administered 2012-01-04: 1 via TOPICAL

## 2012-01-04 MED ORDER — 0.9 % SODIUM CHLORIDE (POUR BTL) OPTIME
TOPICAL | Status: DC | PRN
  Administered 2012-01-04: 1000 mL

## 2012-01-04 MED ORDER — HYDROMORPHONE HCL PF 1 MG/ML IJ SOLN
0.2500 mg | INTRAMUSCULAR | Status: DC | PRN
  Administered 2012-01-04 (×6): 0.5 mg via INTRAVENOUS

## 2012-01-04 MED ORDER — METOCLOPRAMIDE HCL 5 MG/ML IJ SOLN
INTRAMUSCULAR | Status: DC | PRN
  Administered 2012-01-04: 10 mg via INTRAVENOUS

## 2012-01-04 MED ORDER — SODIUM CHLORIDE 0.9 % IV SOLN
INTRAVENOUS | Status: AC
  Filled 2012-01-04: qty 500

## 2012-01-04 MED ORDER — SODIUM CHLORIDE 0.9 % IV SOLN
INTRAVENOUS | Status: DC | PRN
  Administered 2012-01-04: 20:00:00 via INTRAVENOUS
  Administered 2012-01-05: 1000 mL

## 2012-01-04 MED ORDER — CEFAZOLIN SODIUM 1-5 GM-% IV SOLN
INTRAVENOUS | Status: AC
  Filled 2012-01-04: qty 50

## 2012-01-04 MED ORDER — SODIUM CHLORIDE 0.9 % IR SOLN
Status: DC | PRN
  Administered 2012-01-04: 22:00:00

## 2012-01-04 MED ORDER — ROCURONIUM BROMIDE 100 MG/10ML IV SOLN
INTRAVENOUS | Status: DC | PRN
  Administered 2012-01-04: 50 mg via INTRAVENOUS

## 2012-01-04 MED ORDER — THROMBIN 5000 UNITS EX SOLR
CUTANEOUS | Status: DC | PRN
  Administered 2012-01-04 (×2): 5000 [IU] via TOPICAL

## 2012-01-04 MED ORDER — BACITRACIN 50000 UNITS IM SOLR
INTRAMUSCULAR | Status: AC
  Filled 2012-01-04: qty 1

## 2012-01-04 MED ORDER — NEOSTIGMINE METHYLSULFATE 1 MG/ML IJ SOLN
INTRAMUSCULAR | Status: DC | PRN
  Administered 2012-01-04: 3 mg via INTRAVENOUS

## 2012-01-04 MED ORDER — MIDAZOLAM HCL 5 MG/5ML IJ SOLN
INTRAMUSCULAR | Status: DC | PRN
  Administered 2012-01-04: 2 mg via INTRAVENOUS

## 2012-01-04 MED ORDER — ONDANSETRON HCL 4 MG/2ML IJ SOLN
INTRAMUSCULAR | Status: DC | PRN
  Administered 2012-01-04: 4 mg via INTRAVENOUS

## 2012-01-04 MED ORDER — PROPOFOL 10 MG/ML IV EMUL
INTRAVENOUS | Status: DC | PRN
  Administered 2012-01-04: 100 mg via INTRAVENOUS

## 2012-01-04 MED ORDER — GLYCOPYRROLATE 0.2 MG/ML IJ SOLN
INTRAMUSCULAR | Status: DC | PRN
  Administered 2012-01-04: .5 mg via INTRAVENOUS

## 2012-01-04 SURGICAL SUPPLY — 50 items
ADH SKN CLS APL DERMABOND .7 (GAUZE/BANDAGES/DRESSINGS) ×1
APL SKNCLS STERI-STRIP NONHPOA (GAUZE/BANDAGES/DRESSINGS) ×1
BAG DECANTER FOR FLEXI CONT (MISCELLANEOUS) ×2 IMPLANT
BENZOIN TINCTURE PRP APPL 2/3 (GAUZE/BANDAGES/DRESSINGS) ×2 IMPLANT
BLADE SURG ROTATE 9660 (MISCELLANEOUS) ×2 IMPLANT
BRUSH SCRUB EZ PLAIN DRY (MISCELLANEOUS) ×2 IMPLANT
CANISTER SUCTION 2500CC (MISCELLANEOUS) ×2 IMPLANT
CLOSURE STERI STRIP 1/2 X4 (GAUZE/BANDAGES/DRESSINGS) ×1 IMPLANT
CLOTH BEACON ORANGE TIMEOUT ST (SAFETY) ×2 IMPLANT
CONT SPEC 4OZ CLIKSEAL STRL BL (MISCELLANEOUS) ×2 IMPLANT
DERMABOND ADVANCED (GAUZE/BANDAGES/DRESSINGS) ×1
DERMABOND ADVANCED .7 DNX12 (GAUZE/BANDAGES/DRESSINGS) IMPLANT
DRAPE LAPAROTOMY 100X72X124 (DRAPES) ×2 IMPLANT
DRAPE MICROSCOPE ZEISS OPMI (DRAPES) ×1 IMPLANT
DRAPE POUCH INSTRU U-SHP 10X18 (DRAPES) ×2 IMPLANT
DRAPE SURG 17X23 STRL (DRAPES) ×4 IMPLANT
DRESSING TELFA 8X3 (GAUZE/BANDAGES/DRESSINGS) ×2 IMPLANT
DURASEAL 3ML ×1 IMPLANT
DURASEAL SPINE SEALANT 3ML (MISCELLANEOUS) ×1 IMPLANT
ELECT REM PT RETURN 9FT ADLT (ELECTROSURGICAL) ×2
ELECTRODE REM PT RTRN 9FT ADLT (ELECTROSURGICAL) ×1 IMPLANT
EVACUATOR 1/8 PVC DRAIN (DRAIN) ×1 IMPLANT
GAUZE SPONGE 4X4 16PLY XRAY LF (GAUZE/BANDAGES/DRESSINGS) IMPLANT
GLOVE BIO SURGEON STRL SZ 6.5 (GLOVE) ×2 IMPLANT
GLOVE ECLIPSE 7.5 STRL STRAW (GLOVE) ×2 IMPLANT
GLOVE INDICATOR 6.5 STRL GRN (GLOVE) ×1 IMPLANT
GOWN BRE IMP SLV AUR LG STRL (GOWN DISPOSABLE) ×2 IMPLANT
GOWN BRE IMP SLV AUR XL STRL (GOWN DISPOSABLE) ×1 IMPLANT
GOWN STRL REIN 2XL LVL4 (GOWN DISPOSABLE) IMPLANT
KIT BASIN OR (CUSTOM PROCEDURE TRAY) ×2 IMPLANT
KIT ROOM TURNOVER OR (KITS) ×2 IMPLANT
NDL SPNL 22GX3.5 QUINCKE BK (NEEDLE) ×1 IMPLANT
NEEDLE HYPO 22GX1.5 SAFETY (NEEDLE) ×2 IMPLANT
NEEDLE SPNL 22GX3.5 QUINCKE BK (NEEDLE) ×2 IMPLANT
NS IRRIG 1000ML POUR BTL (IV SOLUTION) ×2 IMPLANT
PACK LAMINECTOMY NEURO (CUSTOM PROCEDURE TRAY) ×2 IMPLANT
PAD ARMBOARD 7.5X6 YLW CONV (MISCELLANEOUS) ×6 IMPLANT
PATTIES SURGICAL .75X.75 (GAUZE/BANDAGES/DRESSINGS) ×1 IMPLANT
RUBBERBAND STERILE (MISCELLANEOUS) ×2 IMPLANT
SPONGE GAUZE 4X4 12PLY (GAUZE/BANDAGES/DRESSINGS) ×2 IMPLANT
SPONGE SURGIFOAM ABS GEL SZ50 (HEMOSTASIS) ×2 IMPLANT
STAPLER SKIN PROX WIDE 3.9 (STAPLE) ×2 IMPLANT
SUT VIC AB 2-0 OS6 18 (SUTURE) ×6 IMPLANT
SUT VIC AB 3-0 CP2 18 (SUTURE) ×3 IMPLANT
SYR 20ML ECCENTRIC (SYRINGE) ×2 IMPLANT
TAPE CLOTH 4X10 WHT NS (GAUZE/BANDAGES/DRESSINGS) ×1 IMPLANT
TOOL FLUTED BALL 7MM (MISCELLANEOUS) ×2 IMPLANT
TOWEL OR 17X24 6PK STRL BLUE (TOWEL DISPOSABLE) ×2 IMPLANT
TOWEL OR 17X26 10 PK STRL BLUE (TOWEL DISPOSABLE) ×2 IMPLANT
WATER STERILE IRR 1000ML POUR (IV SOLUTION) ×2 IMPLANT

## 2012-01-04 NOTE — Op Note (Signed)
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 An incision was made above the spinous processes of L4-L5 and S1. Using the Bovie cutting current the incision was carried on the spinous processes. Subperiosteal dissection was then carried out bilaterally along the spinous processes and lamina and facet joint. Purulent liquid was encountered even before he had done any bone work. Self-retaining retractor was placed and x-ray showed approach to the appropriate level. Spinous process of L5 and S1 removed. The entire L5 lamina bilaterally and the entire S1 and S2 segments were removed. Inferior edge of the L4 lamina was removed as well. Large amounts of liquid purulent material were encountered and drained. There was a small piece of granulation tissue on the dura and this was removed it was obvious that the dura had been eroded. The arachnoid however was not violated. We therefore chose to patch this at the end of the case. At this time large amounts of irrigation were carried out. We placed the angle dissector under the lamina of L4 and further down the sacrum and no additional purulent material were identified. Additional irrigation was carried out. A small patch of Surgicel and muscle was placed over the small dural defect and Tisseel was placed over this. Because of the significant infection we felt we needed to leave a drain and we felt safe doing this because the arachnoid did not appear to be violated. We then closed the wound in multiple layers of Vicryl on the muscle fascia subcutaneous and subcuticular tissues. Dermabond and Steri-Strips were placed on the skin. A sterile dressing was applied and the patient was extubated and taken to recovery room in stable condition.

## 2012-01-04 NOTE — Progress Notes (Signed)
Physical Therapy Treatment Patient Details Name: Donna Tapia MRN: [id number] DOB: 16-Sep-1951 Today's Date: 01/04/2012  PT Assessment/Plan  PT - Assessment/Plan Comments on Treatment Session: Pt fatigues quickly.  Unsure if pt will be able to manage at home without  assistance.  May need ST-SNF if pt feels she can't manage at home.  Recommend OT consult. PT Plan: Discharge plan needs to be updated PT Frequency: Min 3X/week Recommendations for Other Services: OT consult Follow Up Recommendations: Other (comment) (Unsure if pt can manage at home alone.) Equipment Recommended: Other (comment) (will continue to assess) PT Goals  Acute Rehab PT Goals PT Goal: Supine/Side to Sit - Progress: Progressing toward goal PT Goal: Sit to Supine/Side - Progress: Progressing toward goal PT Goal: Sit to Stand - Progress: Progressing toward goal PT Goal: Stand to Sit - Progress: Progressing toward goal PT Goal: Ambulate - Progress: Progressing toward goal  PT Treatment Precautions/Restrictions  Precautions Precautions: Fall Mobility (including Balance) Bed Mobility Rolling Left: 5: Supervision Left Sidelying to Sit: 4: Min assist;HOB elevated (comment degrees) (HOB 15 degrees) Sitting - Scoot to Edge of Bed: 6: Modified independent (Device/Increase time) Sit to Supine: 6: Modified independent (Device/Increase time) Transfers Sit to Stand: 4: Min assist;With upper extremity assist;From chair/3-in-1;From bed (min guard assist) Stand to Sit: 5: Supervision;With upper extremity assist;To bed;To chair/3-in-1;With armrests Ambulation/Gait Ambulation/Gait Assistance: 4: Min assist Ambulation Distance (Feet): 50 Feet Assistive device: None Gait Pattern: Decreased step length - right;Decreased step length - left;Trunk flexed  Static Standing Balance Static Standing - Balance Support: No upper extremity supported Static Standing - Level of Assistance: 5: Stand by assistance Dynamic Standing  Balance Dynamic Standing - Balance Support: No upper extremity supported Dynamic Standing - Level of Assistance: 4: Min assist Exercise    End of Session PT - End of Session Activity Tolerance: Patient limited by fatigue Patient left: in bed;with call bell in reach Nurse Communication: Mobility status for ambulation General Behavior During Session: [facility] for tasks performed Cognition: [facility] for tasks performed  [facility] 01/04/2012, 1:26 PM  [employer] PT [phone]

## 2012-01-04 NOTE — Progress Notes (Signed)
Paged Dr. Gerlene Fee to confirm pts return to [phone] post op.  Awaiting return call.

## 2012-01-04 NOTE — Progress Notes (Addendum)
MEDICAL STUDENT PROGRESS NOTE  Subjective:    Patient is complaining of intense pain in her back. Fells tired from the amount of procedures done today. Eye swelling feels improved. No cough, chest pain, SOB.  Objective: Vital signs in last 24 hours: Temp:  [98.4 F (36.9 C)-99.1 F (37.3 C)] 98.4 F (36.9 C) (03/05 1431) Pulse Rate:  [75-86] 86  (03/05 1431) Resp:  [18-19] 19  (03/05 1431) BP: (130-139)/(64-71) 139/71 mmHg (03/05 1431) SpO2:  [92 %-94 %] 94 % (03/05 1431) Weight:  [62.2 kg (137 lb 2 oz)] 62.2 kg (137 lb 2 oz) (03/05 0528)  Intake/Output from previous day: 03/04 0701 - 03/05 0700 In: 1950.5 [P.O.:480; I.V.:820.5; IV Piggyback:650] Out: 1700 [Urine:1700] Intake/Output this shift:   Pt is in acute distress due to pain  EYES: Improved swelling in the right eye compared to yesterday. Mildly erythematous.  Cv: RRR, normal s1 s2, with a soft 16 systolic murmur on the LUSB and RUSB.  LUNGS: CTA bilaterally MUSC: significant back tenderness, improved shoulder ROM  Lab Results  Basename 01/04/12 0653 01/03/12 0624  WBC 13.1* 14.7*  HGB 7.4* 7.6*  HCT 22.2* 22.7*  NA 133* 133*  K 3.8 3.3*  CL 104 101  CO2 23 23  BUN 11 8  CREATININE 0.57 0.55  GLU -- --   Microbiology: Recent Results (from the past 240 hour(s))  URINE CULTURE     Status: Normal   Collection Time   12/31/11  4:15 AM      Component Value Range Status Comment   Specimen Description URINE, CLEAN CATCH   Final    Special Requests NONE   Final    Culture  Setup Time [id number]   Final    Colony Count NO GROWTH   Final    Culture NO GROWTH   Final    Report Status 01/01/2012 FINAL   Final   CULTURE, BLOOD (ROUTINE X 2)     Status: Normal (Preliminary result)   Collection Time   12/31/11  8:42 AM      Component Value Range Status Comment   Specimen Description BLOOD LEFT ARM   Final    Special Requests BOTTLES DRAWN AEROBIC ONLY 10CC   Final    Culture  Setup Time [id number]   Final    Culture     Final    Value:        BLOOD CULTURE RECEIVED NO GROWTH TO DATE CULTURE WILL BE HELD FOR 5 DAYS BEFORE ISSUING A FINAL NEGATIVE REPORT   Report Status PENDING   Incomplete   CULTURE, BLOOD (ROUTINE X 2)     Status: Normal (Preliminary result)   Collection Time   12/31/11  8:48 AM      Component Value Range Status Comment   Specimen Description BLOOD LEFT HAND   Final    Special Requests BOTTLES DRAWN AEROBIC ONLY 10CC   Final    Culture  Setup Time [id number]   Final    Culture     Final    Value:        BLOOD CULTURE RECEIVED NO GROWTH TO DATE CULTURE WILL BE HELD FOR 5 DAYS BEFORE ISSUING A FINAL NEGATIVE REPORT   Report Status PENDING   Incomplete   CULTURE, SPUTUM-ASSESSMENT     Status: Normal   Collection Time   12/31/11  2:15 PM      Component Value Range Status Comment   Specimen Description SPUTUM   Final  Special Requests NONE   Final    Sputum evaluation     Final    Value: THIS SPECIMEN IS ACCEPTABLE. RESPIRATORY CULTURE REPORT TO FOLLOW.   Report Status 12/31/2011 FINAL   Final   CULTURE, RESPIRATORY     Status: Normal   Collection Time   12/31/11  2:15 PM      Component Value Range Status Comment   Specimen Description SPUTUM   Final    Special Requests NONE   Final    Gram Stain     Final    Value: NO WBC SEEN     NO SQUAMOUS EPITHELIAL CELLS SEEN     FEW YEAST   Culture NORMAL OROPHARYNGEAL FLORA   Final    Report Status 01/03/2012 FINAL   Final   STOOL CULTURE     Status: Normal   Collection Time   12/31/11  2:45 PM      Component Value Range Status Comment   Specimen Description STOOL   Final    Special Requests NONE   Final    Culture     Final    Value: NO SALMONELLA, SHIGELLA, CAMPYLOBACTER, OR YERSINIA ISOLATED   Report Status 01/04/2012 FINAL   Final   CLOSTRIDIUM DIFFICILE BY PCR     Status: Normal   Collection Time   01/02/12 11:29 AM      Component Value Range Status Comment   C difficile by pcr NEGATIVE  NEGATIVE  Final   EYE CULTURE      Status: Normal (Preliminary result)   Collection Time   01/03/12  1:45 PM      Component Value Range Status Comment   Specimen Description EYE LEFT   Final    Special Requests     Final    Value: VTREOUS PT ON VANC CEFTAZ DO SENS TO ALL GROWTH INCLUDING COAG NEG STAPH PER MD   Culture NO GROWTH   Final    Report Status PENDING   Incomplete   EYE CULTURE     Status: Normal (Preliminary result)   Collection Time   01/03/12  1:45 PM      Component Value Range Status Comment   Specimen Description EYE LEFT   Final    Special Requests     Final    Value: ANTEROR CHAMBER PT ON VANC CEFTAZ DO SENS TO ALL GROWTH INCLUDING COAG NEG STAPH PER MD   Culture NO GROWTH   Final    Report Status PENDING   Incomplete   CULTURE, SPUTUM-ASSESSMENT     Status: Normal   Collection Time   01/04/12  6:48 AM      Component Value Range Status Comment   Specimen Description SPUTUM   Final    Special Requests NONE   Final    Sputum evaluation     Final    Value: MICROSCOPIC FINDINGS SUGGEST THAT THIS SPECIMEN IS NOT REPRESENTATIVE OF LOWER RESPIRATORY SECRETIONS. PLEASE RECOLLECT.     CALLED TO [facility] RN 01/04/12 0800 COSTELLO B   Report Status 01/04/2012 FINAL   Final     Studies/Results: Dg Chest 2 View  01/04/2012  *RADIOLOGY REPORT*  Clinical Data: Pneumonia.  CHEST - 2 VIEW  Comparison: 12/31/2011  Findings: Continued areas of consolidation throughout the left lung, most confluent in the left upper lobe.  Patchy right upper lobe opacities are again noted.  Biapical scarring.  Heart is normal size.  Bilateral pleural effusions, new since prior study. Increased since prior  study.  IMPRESSION: Continued multi focal airspace opacities, likely pneumonia. Enlarging bilateral effusions.  Original Report Authenticated By: Cyndie Chime, M.D.   Mr [name] Wo Contrast  01/02/2012  *RADIOLOGY REPORT*  Clinical Data:  Swollen painful left eye with decreased vision. Left eye blindness.  MRI HEAD AND ORBITS WITHOUT AND WITH  CONTRAST  Technique:  Multiplanar, multiecho pulse sequences of the brain and surrounding structures were obtained without and with intravenous contrast. Multiplanar, multiecho pulse sequences of the orbits and surrounding structures were obtained including fat saturation techniques, before and after intravenous contrast administration.  Contrast: 13 ml Multihance IV  Comparison:  CT 01/01/2012  MRI HEAD  Findings:  Negative for acute infarct.  No significant chronic ischemia.  Negative for intracranial mass or hemorrhage. Ventricles are normal in size.  Paranasal sinuses are clear.  Extensive inflammation is present in the left orbit with proptosis. There is edema of the sclera extending into the orbital fat.  See separate report of the orbit.  The diffusion weighted imaging is positive throughout the sclera suggesting acute infection.  Postcontrast imaging of the brain reveals normal enhancement.  No evidence of brain abscess or meningeal thickening.  IMPRESSION: No significant intracranial infection.   Left orbital edema.  MRI ORBITS  Findings: Extensive orbital edema is present on the left.  There is proptosis which has progressed from the prior study yesterday. There is a thickened shaggy sclera on the left globe which enhances diffusely.  There is considerable enhancement surrounding the distal optic nerve and surrounding   intraconal orbital fat.  This has progressed considerably from the CT.  There is also edema and enhancement in the subcutaneous tissues of the eye lid and surrounding soft tissues.  Axial postcontrast images raise the possibility of a small abscess in the superior medial quadrant of the left orbit.  However, I believe this is some fat with surrounding inflammation based on the T2 and coronal T1  images.  I also considered  the possibility septic thrombophlebitis of the superior orbital vein however I do not think this is present at this time.  The cavernous sinus appears symmetric and  normal bilaterally.  No significant sinusitis is present.  The right orbit is normal.  IMPRESSION: Progression of left orbital edema since yesterday.  There is thickening with shaggy enhancement of the sclera of the eye on the left.  There is enhancement in the intraconal orbital fat especially around the optic nerve.  No definite abscess is identified at this time.  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  I discussed the findings by telephone with Dr. Daiva Eves on 01/02/2012 at 1500 hours.  I have been unable to reach Dr. Randon Goldsmith who is not on call.  Original Report Authenticated By: Camelia Phenes, M.D.   Mr Cervical Spine W Wo Contrast  01/04/2012  *RADIOLOGY REPORT*  Clinical Data:  Severe pain.  Question epidural abscess.  MRI CERVICAL, THORACIC AND LUMBAR SPINE WITHOUT AND WITH CONTRAST  Technique:  Multiplanar and multiecho pulse sequences of the cervical spine, to include the craniocervical junction and cervicothoracic junction, and thoracic and lumbar spine, were obtained without and with intravenous contrast.  Contrast: 13mL MULTIHANCE GADOBENATE DIMEGLUMINE 529 MG/ML IV SOLN  Comparison:  CT abdomen and pelvis 12/30/2011 and CT chest 12/31/2011.  MRI CERVICAL SPINE  Findings:  Patient motion somewhat degrades study.  Vertebral body height, signal and alignment are maintained.  There is no epidural abscess.  No evidence of diskitis/osteomyelitis.  Cervical cord signal  is normal and the craniocervical junction is normal. Paraspinous structures are unremarkable.  C2-3:  Negative.  C3-4:  Mild disc bulging uncovertebral disease.  Central canal is open.  Foramina are mildly narrowed.  C4-5:  Facet arthropathy and a mild disc bulge.  Central canal and foramina are open.  C5-6:  Disc bulge effaces the ventral thecal sac and causes some deformity of the cord.  Moderate bilateral foraminal narrowing is present.  C6-7:  Disc bulge contacts the cord without deforming it.  There is mild to moderate left  foraminal narrowing the right foramen open.  C7-T1:  Negative.  IMPRESSION:  1.  Negative for epidural abscess or other infectious process. 2.  Degenerative disease most notable at C5-6 where a disc bulge causes some deformity of the ventral cord and there is moderate bilateral foraminal narrowing.  MRI THORACIC SPINE  Findings: Vertebral body height, signal and alignment are maintained.  There is no epidural abscess and no evidence of diskitis/osteomyelitis.  The thoracic cord appears normal. Paraspinous structures demonstrate extensive airspace disease on the left.  Patchy airspace disease in the right upper lobe is also noted.  There are left worse than right pleural effusions.  No disc bulge or protrusion is identified.  Central canal and foramina are open at all levels.  IMPRESSION:  1.  Negative for epidural abscess or diskitis/osteomyelitis. 2.  Left much worse than right airspace disease consistent with pneumonia.  Associated left greater than right pleural effusions also identified.  MRI LUMBAR SPINE  Findings: There is fluid in the disc interspace at L3-4 with edema and enhancement throughout the L3 and L4 vertebral bodies.  Also seen is marked marrow edema about the right L4-5 facet with post contrast enhancement.  A large epidural abscess is identified posterior to the thecal sac extending from the inferior aspect of L4 to the S1-2 level.  The abscess measures approximately 5.6 cm cranial-caudal by up to 1.6 cm transverse by 1.1 cm AP.  There is mass effect on the thecal sac, worst at L5-S1 where the sac is markedly compressed.  Given its posterior position, the abscess likely originates from the right L4-5 facet.  There is also a left psoas abscess which is incompletely visualized and measures approximately 2.2 cm transverse by 2.7 cm AP by 6 cm cranial- caudal.  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  There is no fracture or subluxation of the lumbar spine.  L1-2:  Disc bulge eccentric  to the right without central canal stenosis.  There is some narrowing of the right lateral recess and foramen.  L2-3:  Disc bulge and ligamentum flavum thickening. There appears to be a small extruded disc fragment in the left lateral recess which could encroach on the descending left L3 root.  Finding could be due to a tiny epidural abscess although this is felt less likely.  L3-4:  No disc bulge or protrusion.  L4-5:  Disc bulge is identified.  No central canal stenosis due to disc.  Foramina are mildly narrowed.  L5-S1:  Negative for disc bulge or protrusion.  IMPRESSION:  1.  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  Large and posterior epidural abscess compresses the thecal sac from the L4 level to S1-2 as described above.  Also seen is a large left psoas abscess. Critical Value/emergent results were called by telephone at the time of interpretation on 01/04/2012  at 2:10 p.m.  to  Dr. Luciana Axe, who verbally acknowledged these results. 2.  Likely extruded disc fragment in the left lateral recess at L2- 3 could impact the descending left L3 root.  Original Report Authenticated By: Bernadene Bell. Maricela Curet, M.D.   Mr Thoracic Spine W Wo Contrast  01/04/2012  *RADIOLOGY REPORT*  Clinical Data:  Severe pain.  Question epidural abscess.  MRI CERVICAL, THORACIC AND LUMBAR SPINE WITHOUT AND WITH CONTRAST  Technique:  Multiplanar and multiecho pulse sequences of the cervical spine, to include the craniocervical junction and cervicothoracic junction, and thoracic and lumbar spine, were obtained without and with intravenous contrast.  Contrast: 13mL MULTIHANCE GADOBENATE DIMEGLUMINE 529 MG/ML IV SOLN  Comparison:  CT abdomen and pelvis 12/30/2011 and CT chest 12/31/2011.  MRI CERVICAL SPINE  Findings:  Patient motion somewhat degrades study.  Vertebral body height, signal and alignment are maintained.  There is no epidural abscess.  No evidence of diskitis/osteomyelitis.   Cervical cord signal is normal and the craniocervical junction is normal. Paraspinous structures are unremarkable.  C2-3:  Negative.  C3-4:  Mild disc bulging uncovertebral disease.  Central canal is open.  Foramina are mildly narrowed.  C4-5:  Facet arthropathy and a mild disc bulge.  Central canal and foramina are open.  C5-6:  Disc bulge effaces the ventral thecal sac and causes some deformity of the cord.  Moderate bilateral foraminal narrowing is present.  C6-7:  Disc bulge contacts the cord without deforming it.  There is mild to moderate left foraminal narrowing the right foramen open.  C7-T1:  Negative.  IMPRESSION:  1.  Negative for epidural abscess or other infectious process. 2.  Degenerative disease most notable at C5-6 where a disc bulge causes some deformity of the ventral cord and there is moderate bilateral foraminal narrowing.  MRI THORACIC SPINE  Findings: Vertebral body height, signal and alignment are maintained.  There is no epidural abscess and no evidence of diskitis/osteomyelitis.  The thoracic cord appears normal. Paraspinous structures demonstrate extensive airspace disease on the left.  Patchy airspace disease in the right upper lobe is also noted.  There are left worse than right pleural effusions.  No disc bulge or protrusion is identified.  Central canal and foramina are open at all levels.  IMPRESSION:  1.  Negative for epidural abscess or diskitis/osteomyelitis. 2.  Left much worse than right airspace disease consistent with pneumonia.  Associated left greater than right pleural effusions also identified.  MRI LUMBAR SPINE  Findings: There is fluid in the disc interspace at L3-4 with edema and enhancement throughout the L3 and L4 vertebral bodies.  Also seen is marked marrow edema about the right L4-5 facet with post contrast enhancement.  A large epidural abscess is identified posterior to the thecal sac extending from the inferior aspect of L4 to the S1-2 level.  The abscess  measures approximately 5.6 cm cranial-caudal by up to 1.6 cm transverse by 1.1 cm AP.  There is mass effect on the thecal sac, worst at L5-S1 where the sac is markedly compressed.  Given its posterior position, the abscess likely originates from the right L4-5 facet.  There is also a left psoas abscess which is incompletely visualized and measures approximately 2.2 cm transverse by 2.7 cm AP by 6 cm cranial- caudal.  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  There is no fracture or subluxation of the lumbar spine.  L1-2:  Disc bulge eccentric to the right without central canal stenosis.  There is some narrowing of  the right lateral recess and foramen.  L2-3:  Disc bulge and ligamentum flavum thickening. There appears to be a small extruded disc fragment in the left lateral recess which could encroach on the descending left L3 root.  Finding could be due to a tiny epidural abscess although this is felt less likely.  L3-4:  No disc bulge or protrusion.  L4-5:  Disc bulge is identified.  No central canal stenosis due to disc.  Foramina are mildly narrowed.  L5-S1:  Negative for disc bulge or protrusion.  IMPRESSION:  1.  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  Large and posterior epidural abscess compresses the thecal sac from the L4 level to S1-2 as described above.  Also seen is a large left psoas abscess. Critical Value/emergent results were called by telephone at the time of interpretation on 01/04/2012  at 2:10 p.m.  to  Dr. Luciana Axe, who verbally acknowledged these results. 2.  Likely extruded disc fragment in the left lateral recess at L2- 3 could impact the descending left L3 root.  Original Report Authenticated By: Bernadene Bell. Maricela Curet, M.D.   Mr Lumbar Spine W Wo Contrast  01/04/2012  *RADIOLOGY REPORT*  Clinical Data:  Severe pain.  Question epidural abscess.  MRI CERVICAL, THORACIC AND LUMBAR SPINE WITHOUT AND WITH CONTRAST  Technique:   Multiplanar and multiecho pulse sequences of the cervical spine, to include the craniocervical junction and cervicothoracic junction, and thoracic and lumbar spine, were obtained without and with intravenous contrast.  Contrast: 13mL MULTIHANCE GADOBENATE DIMEGLUMINE 529 MG/ML IV SOLN  Comparison:  CT abdomen and pelvis 12/30/2011 and CT chest 12/31/2011.  MRI CERVICAL SPINE  Findings:  Patient motion somewhat degrades study.  Vertebral body height, signal and alignment are maintained.  There is no epidural abscess.  No evidence of diskitis/osteomyelitis.  Cervical cord signal is normal and the craniocervical junction is normal. Paraspinous structures are unremarkable.  C2-3:  Negative.  C3-4:  Mild disc bulging uncovertebral disease.  Central canal is open.  Foramina are mildly narrowed.  C4-5:  Facet arthropathy and a mild disc bulge.  Central canal and foramina are open.  C5-6:  Disc bulge effaces the ventral thecal sac and causes some deformity of the cord.  Moderate bilateral foraminal narrowing is present.  C6-7:  Disc bulge contacts the cord without deforming it.  There is mild to moderate left foraminal narrowing the right foramen open.  C7-T1:  Negative.  IMPRESSION:  1.  Negative for epidural abscess or other infectious process. 2.  Degenerative disease most notable at C5-6 where a disc bulge causes some deformity of the ventral cord and there is moderate bilateral foraminal narrowing.  MRI THORACIC SPINE  Findings: Vertebral body height, signal and alignment are maintained.  There is no epidural abscess and no evidence of diskitis/osteomyelitis.  The thoracic cord appears normal. Paraspinous structures demonstrate extensive airspace disease on the left.  Patchy airspace disease in the right upper lobe is also noted.  There are left worse than right pleural effusions.  No disc bulge or protrusion is identified.  Central canal and foramina are open at all levels.  IMPRESSION:  1.  Negative for epidural  abscess or diskitis/osteomyelitis. 2.  Left much worse than right airspace disease consistent with pneumonia.  Associated left greater than right pleural effusions also identified.  MRI LUMBAR SPINE  Findings: There is fluid in the disc interspace at L3-4 with edema and enhancement throughout the L3  and L4 vertebral bodies.  Also seen is marked marrow edema about the right L4-5 facet with post contrast enhancement.  A large epidural abscess is identified posterior to the thecal sac extending from the inferior aspect of L4 to the S1-2 level.  The abscess measures approximately 5.6 cm cranial-caudal by up to 1.6 cm transverse by 1.1 cm AP.  There is mass effect on the thecal sac, worst at L5-S1 where the sac is markedly compressed.  Given its posterior position, the abscess likely originates from the right L4-5 facet.  There is also a left psoas abscess which is incompletely visualized and measures approximately 2.2 cm transverse by 2.7 cm AP by 6 cm cranial- caudal.  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  There is no fracture or subluxation of the lumbar spine.  L1-2:  Disc bulge eccentric to the right without central canal stenosis.  There is some narrowing of the right lateral recess and foramen.  L2-3:  Disc bulge and ligamentum flavum thickening. There appears to be a small extruded disc fragment in the left lateral recess which could encroach on the descending left L3 root.  Finding could be due to a tiny epidural abscess although this is felt less likely.  L3-4:  No disc bulge or protrusion.  L4-5:  Disc bulge is identified.  No central canal stenosis due to disc.  Foramina are mildly narrowed.  L5-S1:  Negative for disc bulge or protrusion.  IMPRESSION:  1.  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  Large and posterior epidural abscess compresses the thecal sac from the L4 level to S1-2 as described above.  Also seen is a large left psoas  abscess. Critical Value/emergent results were called by telephone at the time of interpretation on 01/04/2012  at 2:10 p.m.  to  Dr. Luciana Axe, who verbally acknowledged these results. 2.  Likely extruded disc fragment in the left lateral recess at L2- 3 could impact the descending left L3 root.  Original Report Authenticated By: Bernadene Bell. Maricela Curet, M.D.   Mr Orbits Wo/w Cm  01/02/2012  *RADIOLOGY REPORT*  Clinical Data:  Swollen painful left eye with decreased vision. Left eye blindness.  MRI HEAD AND ORBITS WITHOUT AND WITH CONTRAST  Technique:  Multiplanar, multiecho pulse sequences of the brain and surrounding structures were obtained without and with intravenous contrast. Multiplanar, multiecho pulse sequences of the orbits and surrounding structures were obtained including fat saturation techniques, before and after intravenous contrast administration.  Contrast: 13 ml Multihance IV  Comparison:  CT 01/01/2012  MRI HEAD  Findings:  Negative for acute infarct.  No significant chronic ischemia.  Negative for intracranial mass or hemorrhage. Ventricles are normal in size.  Paranasal sinuses are clear.  Extensive inflammation is present in the left orbit with proptosis. There is edema of the sclera extending into the orbital fat.  See separate report of the orbit.  The diffusion weighted imaging is positive throughout the sclera suggesting acute infection.  Postcontrast imaging of the brain reveals normal enhancement.  No evidence of brain abscess or meningeal thickening.  IMPRESSION: No significant intracranial infection.   Left orbital edema.  MRI ORBITS  Findings: Extensive orbital edema is present on the left.  There is proptosis which has progressed from the prior study yesterday. There is a thickened shaggy sclera on the left globe which enhances diffusely.  There is considerable enhancement surrounding the distal optic nerve and surrounding  intraconal orbital fat.  This has progressed considerably from the  CT.  There is also edema and enhancement in the subcutaneous tissues of the eye lid and surrounding soft tissues.  Axial postcontrast images raise the possibility of a small abscess in the superior medial quadrant of the left orbit.  However, I believe this is some fat with surrounding inflammation based on the T2 and coronal T1  images.  I also considered  the possibility septic thrombophlebitis of the superior orbital vein however I do not think this is present at this time.  The cavernous sinus appears symmetric and normal bilaterally.  No significant sinusitis is present.  The right orbit is normal.  IMPRESSION: Progression of left orbital edema since yesterday.  There is thickening with shaggy enhancement of the sclera of the eye on the left.  There is enhancement in the intraconal orbital fat especially around the optic nerve.  No definite abscess is identified at this time.  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  I discussed the findings by telephone with Dr. Daiva Eves on 01/02/2012 at 1500 hours.  I have been unable to reach Dr. Randon Goldsmith who is not on call.  Original Report Authenticated By: Camelia Phenes, M.D.    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 Awaiting eye cultures, no growth to date. Seems to have improved since yesterday. Continue multi antibiotic regimen. Unknown cause although presumed to be a result of septic embolization from the lung or back abscess.  2. Lung infiltrates: Chest Xray today shows continued LUL opacities and multifocal infiltrates, essentially unchanged since 02/28. Continue current antibiotic regimen, dc steroids as the condition seems to be likely infectious rather than autoimmune.   3. Psoas Abscess: MRi on 03/05 shows a large psoas abscess, accounting for the patient's intense back pain. Will schedule CT guided drainage of the abscess after discussion with patient to obtain consent. Abscess speculated to be the primary nidus of the  patient's septic emboli.     LOS: 5 days    Christopulos, Georgios 01/04/2012, 4:05 PM   Patient seen and agree with above. Please see attending progress note.

## 2012-01-04 NOTE — Progress Notes (Signed)
INFECTIOUS DISEASE PROGRESS NOTE  ID: Donna Tapia is a 61 y.o. 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    Subjective: Patient has been away for TEE, MRI.  Abtx:  Anti-infectives     Start     Dose/Rate Route Frequency Ordered Stop   01/04/12 1830   fluconazole (DIFLUCAN) IVPB 400 mg        400 mg 200 mL/hr over 60 Minutes Intravenous Every 24 hours 01/03/12 1827     01/03/12 1930   fluconazole (DIFLUCAN) IVPB 800 mg        800 mg 400 mL/hr over 60 Minutes Intravenous  Once 01/03/12 1826 01/03/12 2139   01/03/12 1245   cefTAZidime (FORTAZ) ophth injection 2.25 mg        2.25 mg Intravitreal  Once 01/03/12 1135     01/03/12 1245   cefTAZidime (FORTAZ) ophth injection 100 mg        100 mg Subconjunctival  Once 01/03/12 1135     01/03/12 1200   vancomycin (VANCOCIN) ophth injection 0.1 mg        0.1 mg Intravitreal  Once 01/03/12 1130     01/03/12 1200   vancomycin (VANCOCIN) ophth injection 25 mg        25 mg Subconjunctival  Once 01/03/12 1131     01/03/12 0500   vancomycin (VANCOCIN) 750 mg in sodium chloride 0.9 % 150 mL IVPB        750 mg 150 mL/hr over 60 Minutes Intravenous Every 12 hours 01/02/12 1716     01/03/12 0200   ceFEPIme (MAXIPIME) 2 g in dextrose 5 % 50 mL IVPB  Status:  Discontinued        2 g 100 mL/hr over 30 Minutes Intravenous Every 8 hours 01/02/12 1716 01/02/12 2041   01/02/12 2200   cefTAZidime (FORTAZ) 1 g in dextrose 5 % 50 mL IVPB        1 g 100 mL/hr over 30 Minutes Intravenous 3 times per day 01/02/12 2106     01/02/12 1400   ceFEPIme (MAXIPIME) 2 g in dextrose 5 % 50 mL IVPB  Status:  Discontinued        2 g 100 mL/hr over 30 Minutes Intravenous 3 times per day 01/02/12 1318 01/02/12 1716   01/02/12 1400   vancomycin (VANCOCIN) 750 mg in sodium chloride 0.9 % 150 mL IVPB  Status:  Discontinued        750 mg 150 mL/hr over 60 Minutes Intravenous Every 12 hours 01/02/12 1350 01/02/12 1716   12/31/11  1530   valACYclovir (VALTREX) tablet 2,000 mg        2,000 mg Oral 2 times daily 12/31/11 1358 12/31/11 2235   12/31/11 0100   moxifloxacin (AVELOX) IVPB 400 mg  Status:  Discontinued        400 mg 250 mL/hr over 60 Minutes Intravenous Every 24 hours 12/31/11 0040 01/03/12 1913          Medications: I have reviewed the patient's current medications.  Objective: Vital signs in last 24 hours: Temp:  [98.4 F (36.9 C)-99.1 F (37.3 C)] 98.4 F (36.9 C) (03/05 1431) Pulse Rate:  [75-86] 86  (03/05 1431) Resp:  [18-19] 19  (03/05 1431) BP: (130-139)/(64-71) 139/71 mmHg (03/05 1431) SpO2:  [92 %-94 %] 94 % (03/05 1431) Weight:  [137 lb 2 oz (62.2 kg)] 137 lb 2 oz (62.2 kg) (03/05 0528)   General appearance: patient not available.  Lab  Results  Basename 01/04/12 0653 01/03/12 0624  WBC 13.1* 14.7*  HGB 7.4* 7.6*  HCT 22.2* 22.7*  NA 133* 133*  K 3.8 3.3*  CL 104 101  CO2 23 23  BUN 11 8  CREATININE 0.57 0.55  GLU -- --   Liver Panel No results found for this basename: PROT:2,ALBUMIN:2,AST:2,ALT:2,ALKPHOS:2,BILITOT:2,BILIDIR:2,IBILI:2 in the last 72 hours Sedimentation Rate No results found for this basename: ESRSEDRATE in the last 72 hours C-Reactive Protein No results found for this basename: CRP:2 in the last 72 hours  Microbiology: Recent Results (from the past 240 hour(s))  URINE CULTURE     Status: Normal   Collection Time   12/31/11  4:15 AM      Component Value Range Status Comment   Specimen Description URINE, CLEAN CATCH   Final    Special Requests NONE   Final    Culture  Setup Time [id number]   Final    Colony Count NO GROWTH   Final    Culture NO GROWTH   Final    Report Status 01/01/2012 FINAL   Final   CULTURE, BLOOD (ROUTINE X 2)     Status: Normal (Preliminary result)   Collection Time   12/31/11  8:42 AM      Component Value Range Status Comment   Specimen Description BLOOD LEFT ARM   Final    Special Requests BOTTLES DRAWN AEROBIC ONLY 10CC    Final    Culture  Setup Time [id number]   Final    Culture     Final    Value:        BLOOD CULTURE RECEIVED NO GROWTH TO DATE CULTURE WILL BE HELD FOR 5 DAYS BEFORE ISSUING A FINAL NEGATIVE REPORT   Report Status PENDING   Incomplete   CULTURE, BLOOD (ROUTINE X 2)     Status: Normal (Preliminary result)   Collection Time   12/31/11  8:48 AM      Component Value Range Status Comment   Specimen Description BLOOD LEFT HAND   Final    Special Requests BOTTLES DRAWN AEROBIC ONLY 10CC   Final    Culture  Setup Time [id number]   Final    Culture     Final    Value:        BLOOD CULTURE RECEIVED NO GROWTH TO DATE CULTURE WILL BE HELD FOR 5 DAYS BEFORE ISSUING A FINAL NEGATIVE REPORT   Report Status PENDING   Incomplete   CULTURE, SPUTUM-ASSESSMENT     Status: Normal   Collection Time   12/31/11  2:15 PM      Component Value Range Status Comment   Specimen Description SPUTUM   Final    Special Requests NONE   Final    Sputum evaluation     Final    Value: THIS SPECIMEN IS ACCEPTABLE. RESPIRATORY CULTURE REPORT TO FOLLOW.   Report Status 12/31/2011 FINAL   Final   CULTURE, RESPIRATORY     Status: Normal   Collection Time   12/31/11  2:15 PM      Component Value Range Status Comment   Specimen Description SPUTUM   Final    Special Requests NONE   Final    Gram Stain     Final    Value: NO WBC SEEN     NO SQUAMOUS EPITHELIAL CELLS SEEN     FEW YEAST   Culture NORMAL OROPHARYNGEAL FLORA   Final    Report Status 01/03/2012 FINAL   Final  STOOL CULTURE     Status: Normal   Collection Time   12/31/11  2:45 PM      Component Value Range Status Comment   Specimen Description STOOL   Final    Special Requests NONE   Final    Culture     Final    Value: NO SALMONELLA, SHIGELLA, CAMPYLOBACTER, OR YERSINIA ISOLATED   Report Status 01/04/2012 FINAL   Final   CLOSTRIDIUM DIFFICILE BY PCR     Status: Normal   Collection Time   01/02/12 11:29 AM      Component Value Range Status Comment   C  difficile by pcr NEGATIVE  NEGATIVE  Final   EYE CULTURE     Status: Normal (Preliminary result)   Collection Time   01/03/12  1:45 PM      Component Value Range Status Comment   Specimen Description EYE LEFT   Final    Special Requests     Final    Value: VTREOUS PT ON VANC CEFTAZ DO SENS TO ALL GROWTH INCLUDING COAG NEG STAPH PER MD   Culture NO GROWTH   Final    Report Status PENDING   Incomplete   EYE CULTURE     Status: Normal (Preliminary result)   Collection Time   01/03/12  1:45 PM      Component Value Range Status Comment   Specimen Description EYE LEFT   Final    Special Requests     Final    Value: ANTEROR CHAMBER PT ON VANC CEFTAZ DO SENS TO ALL GROWTH INCLUDING COAG NEG STAPH PER MD   Culture NO GROWTH   Final    Report Status PENDING   Incomplete   CULTURE, SPUTUM-ASSESSMENT     Status: Normal   Collection Time   01/04/12  6:48 AM      Component Value Range Status Comment   Specimen Description SPUTUM   Final    Special Requests NONE   Final    Sputum evaluation     Final    Value: MICROSCOPIC FINDINGS SUGGEST THAT THIS SPECIMEN IS NOT REPRESENTATIVE OF LOWER RESPIRATORY SECRETIONS. PLEASE RECOLLECT.     CALLED TO [facility] RN 01/04/12 0800 COSTELLO B   Report Status 01/04/2012 FINAL   Final     Studies/Results: Dg Chest 2 View  01/04/2012  *RADIOLOGY REPORT*  Clinical Data: Pneumonia.  CHEST - 2 VIEW  Comparison: 12/31/2011  Findings: Continued areas of consolidation throughout the left lung, most confluent in the left upper lobe.  Patchy right upper lobe opacities are again noted.  Biapical scarring.  Heart is normal size.  Bilateral pleural effusions, new since prior study. Increased since prior study.  IMPRESSION: Continued multi focal airspace opacities, likely pneumonia. Enlarging bilateral effusions.  Original Report Authenticated By: Cyndie Chime, M.D.   Mr [name] Wo Contrast  01/02/2012  *RADIOLOGY REPORT*  Clinical Data:  Swollen painful left eye with decreased  vision. Left eye blindness.  MRI HEAD AND ORBITS WITHOUT AND WITH CONTRAST  Technique:  Multiplanar, multiecho pulse sequences of the brain and surrounding structures were obtained without and with intravenous contrast. Multiplanar, multiecho pulse sequences of the orbits and surrounding structures were obtained including fat saturation techniques, before and after intravenous contrast administration.  Contrast: 13 ml Multihance IV  Comparison:  CT 01/01/2012  MRI HEAD  Findings:  Negative for acute infarct.  No significant chronic ischemia.  Negative for intracranial mass or hemorrhage. Ventricles are normal in size.  Paranasal sinuses are clear.  Extensive inflammation is present in the left orbit with proptosis. There is edema of the sclera extending into the orbital fat.  See separate report of the orbit.  The diffusion weighted imaging is positive throughout the sclera suggesting acute infection.  Postcontrast imaging of the brain reveals normal enhancement.  No evidence of brain abscess or meningeal thickening.  IMPRESSION: No significant intracranial infection.   Left orbital edema.  MRI ORBITS  Findings: Extensive orbital edema is present on the left.  There is proptosis which has progressed from the prior study yesterday. There is a thickened shaggy sclera on the left globe which enhances diffusely.  There is considerable enhancement surrounding the distal optic nerve and surrounding   intraconal orbital fat.  This has progressed considerably from the CT.  There is also edema and enhancement in the subcutaneous tissues of the eye lid and surrounding soft tissues.  Axial postcontrast images raise the possibility of a small abscess in the superior medial quadrant of the left orbit.  However, I believe this is some fat with surrounding inflammation based on the T2 and coronal T1  images.  I also considered  the possibility septic thrombophlebitis of the superior orbital vein however I do not think this is  present at this time.  The cavernous sinus appears symmetric and normal bilaterally.  No significant sinusitis is present.  The right orbit is normal.  IMPRESSION: Progression of left orbital edema since yesterday.  There is thickening with shaggy enhancement of the sclera of the eye on the left.  There is enhancement in the intraconal orbital fat especially around the optic nerve.  No definite abscess is identified at this time.  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  I discussed the findings by telephone with Dr. Daiva Eves on 01/02/2012 at 1500 hours.  I have been unable to reach Dr. Randon Goldsmith who is not on call.  Original Report Authenticated By: Camelia Phenes, M.D.   Mr Cervical Spine W Wo Contrast  01/04/2012  *RADIOLOGY REPORT*  Clinical Data:  Severe pain.  Question epidural abscess.  MRI CERVICAL, THORACIC AND LUMBAR SPINE WITHOUT AND WITH CONTRAST  Technique:  Multiplanar and multiecho pulse sequences of the cervical spine, to include the craniocervical junction and cervicothoracic junction, and thoracic and lumbar spine, were obtained without and with intravenous contrast.  Contrast: 13mL MULTIHANCE GADOBENATE DIMEGLUMINE 529 MG/ML IV SOLN  Comparison:  CT abdomen and pelvis 12/30/2011 and CT chest 12/31/2011.  MRI CERVICAL SPINE  Findings:  Patient motion somewhat degrades study.  Vertebral body height, signal and alignment are maintained.  There is no epidural abscess.  No evidence of diskitis/osteomyelitis.  Cervical cord signal is normal and the craniocervical junction is normal. Paraspinous structures are unremarkable.  C2-3:  Negative.  C3-4:  Mild disc bulging uncovertebral disease.  Central canal is open.  Foramina are mildly narrowed.  C4-5:  Facet arthropathy and a mild disc bulge.  Central canal and foramina are open.  C5-6:  Disc bulge effaces the ventral thecal sac and causes some deformity of the cord.  Moderate bilateral foraminal narrowing is present.  C6-7:  Disc bulge contacts the  cord without deforming it.  There is mild to moderate left foraminal narrowing the right foramen open.  C7-T1:  Negative.  IMPRESSION:  1.  Negative for epidural abscess or other infectious process. 2.  Degenerative disease most notable at C5-6 where a disc bulge causes some deformity of the ventral cord and there is  moderate bilateral foraminal narrowing.  MRI THORACIC SPINE  Findings: Vertebral body height, signal and alignment are maintained.  There is no epidural abscess and no evidence of diskitis/osteomyelitis.  The thoracic cord appears normal. Paraspinous structures demonstrate extensive airspace disease on the left.  Patchy airspace disease in the right upper lobe is also noted.  There are left worse than right pleural effusions.  No disc bulge or protrusion is identified.  Central canal and foramina are open at all levels.  IMPRESSION:  1.  Negative for epidural abscess or diskitis/osteomyelitis. 2.  Left much worse than right airspace disease consistent with pneumonia.  Associated left greater than right pleural effusions also identified.  MRI LUMBAR SPINE  Findings: There is fluid in the disc interspace at L3-4 with edema and enhancement throughout the L3 and L4 vertebral bodies.  Also seen is marked marrow edema about the right L4-5 facet with post contrast enhancement.  A large epidural abscess is identified posterior to the thecal sac extending from the inferior aspect of L4 to the S1-2 level.  The abscess measures approximately 5.6 cm cranial-caudal by up to 1.6 cm transverse by 1.1 cm AP.  There is mass effect on the thecal sac, worst at L5-S1 where the sac is markedly compressed.  Given its posterior position, the abscess likely originates from the right L4-5 facet.  There is also a left psoas abscess which is incompletely visualized and measures approximately 2.2 cm transverse by 2.7 cm AP by 6 cm cranial- caudal.  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  There is no fracture or  subluxation of the lumbar spine.  L1-2:  Disc bulge eccentric to the right without central canal stenosis.  There is some narrowing of the right lateral recess and foramen.  L2-3:  Disc bulge and ligamentum flavum thickening. There appears to be a small extruded disc fragment in the left lateral recess which could encroach on the descending left L3 root.  Finding could be due to a tiny epidural abscess although this is felt less likely.  L3-4:  No disc bulge or protrusion.  L4-5:  Disc bulge is identified.  No central canal stenosis due to disc.  Foramina are mildly narrowed.  L5-S1:  Negative for disc bulge or protrusion.  IMPRESSION:  1.  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  Large and posterior epidural abscess compresses the thecal sac from the L4 level to S1-2 as described above.  Also seen is a large left psoas abscess. Critical Value/emergent results were called by telephone at the time of interpretation on 01/04/2012  at 2:10 p.m.  to  Dr. Luciana Axe, who verbally acknowledged these results. 2.  Likely extruded disc fragment in the left lateral recess at L2- 3 could impact the descending left L3 root.  Original Report Authenticated By: Bernadene Bell. Maricela Curet, M.D.   Mr Thoracic Spine W Wo Contrast  01/04/2012  *RADIOLOGY REPORT*  Clinical Data:  Severe pain.  Question epidural abscess.  MRI CERVICAL, THORACIC AND LUMBAR SPINE WITHOUT AND WITH CONTRAST  Technique:  Multiplanar and multiecho pulse sequences of the cervical spine, to include the craniocervical junction and cervicothoracic junction, and thoracic and lumbar spine, were obtained without and with intravenous contrast.  Contrast: 13mL MULTIHANCE GADOBENATE DIMEGLUMINE 529 MG/ML IV SOLN  Comparison:  CT abdomen and pelvis 12/30/2011 and CT chest 12/31/2011.  MRI CERVICAL SPINE  Findings:  Patient motion somewhat degrades study.  Vertebral  body height, signal and alignment are maintained.  There  is no epidural abscess.  No evidence of diskitis/osteomyelitis.  Cervical cord signal is normal and the craniocervical junction is normal. Paraspinous structures are unremarkable.  C2-3:  Negative.  C3-4:  Mild disc bulging uncovertebral disease.  Central canal is open.  Foramina are mildly narrowed.  C4-5:  Facet arthropathy and a mild disc bulge.  Central canal and foramina are open.  C5-6:  Disc bulge effaces the ventral thecal sac and causes some deformity of the cord.  Moderate bilateral foraminal narrowing is present.  C6-7:  Disc bulge contacts the cord without deforming it.  There is mild to moderate left foraminal narrowing the right foramen open.  C7-T1:  Negative.  IMPRESSION:  1.  Negative for epidural abscess or other infectious process. 2.  Degenerative disease most notable at C5-6 where a disc bulge causes some deformity of the ventral cord and there is moderate bilateral foraminal narrowing.  MRI THORACIC SPINE  Findings: Vertebral body height, signal and alignment are maintained.  There is no epidural abscess and no evidence of diskitis/osteomyelitis.  The thoracic cord appears normal. Paraspinous structures demonstrate extensive airspace disease on the left.  Patchy airspace disease in the right upper lobe is also noted.  There are left worse than right pleural effusions.  No disc bulge or protrusion is identified.  Central canal and foramina are open at all levels.  IMPRESSION:  1.  Negative for epidural abscess or diskitis/osteomyelitis. 2.  Left much worse than right airspace disease consistent with pneumonia.  Associated left greater than right pleural effusions also identified.  MRI LUMBAR SPINE  Findings: There is fluid in the disc interspace at L3-4 with edema and enhancement throughout the L3 and L4 vertebral bodies.  Also seen is marked marrow edema about the right L4-5 facet with post contrast enhancement.  A large epidural abscess is identified posterior to the thecal sac extending from  the inferior aspect of L4 to the S1-2 level.  The abscess measures approximately 5.6 cm cranial-caudal by up to 1.6 cm transverse by 1.1 cm AP.  There is mass effect on the thecal sac, worst at L5-S1 where the sac is markedly compressed.  Given its posterior position, the abscess likely originates from the right L4-5 facet.  There is also a left psoas abscess which is incompletely visualized and measures approximately 2.2 cm transverse by 2.7 cm AP by 6 cm cranial- caudal.  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  There is no fracture or subluxation of the lumbar spine.  L1-2:  Disc bulge eccentric to the right without central canal stenosis.  There is some narrowing of the right lateral recess and foramen.  L2-3:  Disc bulge and ligamentum flavum thickening. There appears to be a small extruded disc fragment in the left lateral recess which could encroach on the descending left L3 root.  Finding could be due to a tiny epidural abscess although this is felt less likely.  L3-4:  No disc bulge or protrusion.  L4-5:  Disc bulge is identified.  No central canal stenosis due to disc.  Foramina are mildly narrowed.  L5-S1:  Negative for disc bulge or protrusion.  IMPRESSION:  1.  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  Large and posterior epidural abscess compresses the thecal sac from the L4 level to S1-2 as described above.  Also seen is a large left psoas  abscess. Critical Value/emergent results were called by telephone at the time of interpretation on 01/04/2012  at 2:10 p.m.  to  Dr. Luciana Axe, who verbally acknowledged these results. 2.  Likely extruded disc fragment in the left lateral recess at L2- 3 could impact the descending left L3 root.  Original Report Authenticated By: Bernadene Bell. Maricela Curet, M.D.   Mr Lumbar Spine W Wo Contrast  01/04/2012  *RADIOLOGY REPORT*  Clinical Data:  Severe pain.  Question epidural abscess.  MRI CERVICAL, THORACIC  AND LUMBAR SPINE WITHOUT AND WITH CONTRAST  Technique:  Multiplanar and multiecho pulse sequences of the cervical spine, to include the craniocervical junction and cervicothoracic junction, and thoracic and lumbar spine, were obtained without and with intravenous contrast.  Contrast: 13mL MULTIHANCE GADOBENATE DIMEGLUMINE 529 MG/ML IV SOLN  Comparison:  CT abdomen and pelvis 12/30/2011 and CT chest 12/31/2011.  MRI CERVICAL SPINE  Findings:  Patient motion somewhat degrades study.  Vertebral body height, signal and alignment are maintained.  There is no epidural abscess.  No evidence of diskitis/osteomyelitis.  Cervical cord signal is normal and the craniocervical junction is normal. Paraspinous structures are unremarkable.  C2-3:  Negative.  C3-4:  Mild disc bulging uncovertebral disease.  Central canal is open.  Foramina are mildly narrowed.  C4-5:  Facet arthropathy and a mild disc bulge.  Central canal and foramina are open.  C5-6:  Disc bulge effaces the ventral thecal sac and causes some deformity of the cord.  Moderate bilateral foraminal narrowing is present.  C6-7:  Disc bulge contacts the cord without deforming it.  There is mild to moderate left foraminal narrowing the right foramen open.  C7-T1:  Negative.  IMPRESSION:  1.  Negative for epidural abscess or other infectious process. 2.  Degenerative disease most notable at C5-6 where a disc bulge causes some deformity of the ventral cord and there is moderate bilateral foraminal narrowing.  MRI THORACIC SPINE  Findings: Vertebral body height, signal and alignment are maintained.  There is no epidural abscess and no evidence of diskitis/osteomyelitis.  The thoracic cord appears normal. Paraspinous structures demonstrate extensive airspace disease on the left.  Patchy airspace disease in the right upper lobe is also noted.  There are left worse than right pleural effusions.  No disc bulge or protrusion is identified.  Central canal and foramina are open at  all levels.  IMPRESSION:  1.  Negative for epidural abscess or diskitis/osteomyelitis. 2.  Left much worse than right airspace disease consistent with pneumonia.  Associated left greater than right pleural effusions also identified.  MRI LUMBAR SPINE  Findings: There is fluid in the disc interspace at L3-4 with edema and enhancement throughout the L3 and L4 vertebral bodies.  Also seen is marked marrow edema about the right L4-5 facet with post contrast enhancement.  A large epidural abscess is identified posterior to the thecal sac extending from the inferior aspect of L4 to the S1-2 level.  The abscess measures approximately 5.6 cm cranial-caudal by up to 1.6 cm transverse by 1.1 cm AP.  There is mass effect on the thecal sac, worst at L5-S1 where the sac is markedly compressed.  Given its posterior position, the abscess likely originates from the right L4-5 facet.  There is also a left psoas abscess which is incompletely visualized and measures approximately 2.2 cm transverse by 2.7 cm AP by 6 cm cranial- caudal.  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  There is no fracture or subluxation of  the lumbar spine.  L1-2:  Disc bulge eccentric to the right without central canal stenosis.  There is some narrowing of the right lateral recess and foramen.  L2-3:  Disc bulge and ligamentum flavum thickening. There appears to be a small extruded disc fragment in the left lateral recess which could encroach on the descending left L3 root.  Finding could be due to a tiny epidural abscess although this is felt less likely.  L3-4:  No disc bulge or protrusion.  L4-5:  Disc bulge is identified.  No central canal stenosis due to disc.  Foramina are mildly narrowed.  L5-S1:  Negative for disc bulge or protrusion.  IMPRESSION:  1.  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  Large and posterior epidural abscess compresses the thecal sac from the L4 level to S1-2  as described above.  Also seen is a large left psoas abscess. Critical Value/emergent results were called by telephone at the time of interpretation on 01/04/2012  at 2:10 p.m.  to  Dr. Luciana Axe, who verbally acknowledged these results. 2.  Likely extruded disc fragment in the left lateral recess at L2- 3 could impact the descending left L3 root.  Original Report Authenticated By: Bernadene Bell. Maricela Curet, M.D.   Mr Orbits Wo/w Cm  01/02/2012  *RADIOLOGY REPORT*  Clinical Data:  Swollen painful left eye with decreased vision. Left eye blindness.  MRI HEAD AND ORBITS WITHOUT AND WITH CONTRAST  Technique:  Multiplanar, multiecho pulse sequences of the brain and surrounding structures were obtained without and with intravenous contrast. Multiplanar, multiecho pulse sequences of the orbits and surrounding structures were obtained including fat saturation techniques, before and after intravenous contrast administration.  Contrast: 13 ml Multihance IV  Comparison:  CT 01/01/2012  MRI HEAD  Findings:  Negative for acute infarct.  No significant chronic ischemia.  Negative for intracranial mass or hemorrhage. Ventricles are normal in size.  Paranasal sinuses are clear.  Extensive inflammation is present in the left orbit with proptosis. There is edema of the sclera extending into the orbital fat.  See separate report of the orbit.  The diffusion weighted imaging is positive throughout the sclera suggesting acute infection.  Postcontrast imaging of the brain reveals normal enhancement.  No evidence of brain abscess or meningeal thickening.  IMPRESSION: No significant intracranial infection.   Left orbital edema.  MRI ORBITS  Findings: Extensive orbital edema is present on the left.  There is proptosis which has progressed from the prior study yesterday. There is a thickened shaggy sclera on the left globe which enhances diffusely.  There is considerable enhancement surrounding the distal optic nerve and surrounding   intraconal  orbital fat.  This has progressed considerably from the CT.  There is also edema and enhancement in the subcutaneous tissues of the eye lid and surrounding soft tissues.  Axial postcontrast images raise the possibility of a small abscess in the superior medial quadrant of the left orbit.  However, I believe this is some fat with surrounding inflammation based on the T2 and coronal T1  images.  I also considered  the possibility septic thrombophlebitis of the superior orbital vein however I do not think this is present at this time.  The cavernous sinus appears symmetric and normal bilaterally.  No significant sinusitis is present.  The right orbit is normal.  IMPRESSION: Progression of left orbital edema since yesterday.  There is thickening with shaggy enhancement of the sclera of  the eye on the left.  There is enhancement in the intraconal orbital fat especially around the optic nerve.  No definite abscess is identified at this time.  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  I discussed the findings by telephone with Dr. Daiva Eves on 01/02/2012 at 1500 hours.  I have been unable to reach Dr. Randon Goldsmith who is not on call.  Original Report Authenticated By: Camelia Phenes, M.D.     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  Primary team to have surgery consulted for further management.  Remains on broad spectrum antibiotics and have added fluconazole, though low likelihood to be candida.  Will continue to follow cultures and abscess drainage should be cultured for bacterial, AFB smear and culture and fungal smear and culture.    Rayen Palen Infectious Diseases 01/04/2012, 3:36 PM

## 2012-01-04 NOTE — Anesthesia Postprocedure Evaluation (Signed)
Anesthesia Post Note  Patient: Donna Tapia  Procedure(s) Performed: Procedure(s) (LRB): LUMBAR LAMINECTOMY/DECOMPRESSION MICRODISCECTOMY 1 LEVEL (N/A)  Anesthesia type: general  Patient location: PACU  Post pain: Pain level controlled  Post assessment: Patient's Cardiovascular Status Stable  Last Vitals:  Filed Vitals:   01/04/12 2245  BP: 109/58  Pulse: 65  Temp: 36.8 C  Resp: 21    Post vital signs: Reviewed and stable  Level of consciousness: sedated  Complications: No apparent anesthesia complications

## 2012-01-04 NOTE — Progress Notes (Signed)
Name: Donna Tapia MRN: [id number] DOB: [birth year]    LOS: 5 Requesting MD: Zipporah Plants PCCM    Brief patient profile: 60 yowf  never smoker, reports 2 weeks of nasal drainage, coughing with brown sputum, fever and increased weakness. 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 It was recommended that systemic causes of uveitis be excluded. A CXR was performed revealing finding CW pneumonia and Pulm Med requested.  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 Fm hx strongly POS RA      Cultures: 3/1 sputum > nl flora 3/1 bc x 2 >> 3/1 uc >> neg  Antibiotics per ID   Tests / Events: Eye bx 3/4 >> Solumedrol trial 3/4 > d/c by ID same day   Subjective: Feels better today   Vital Signs: Temp:  [98.2 F (36.8 C)-99.1 F (37.3 C)] 99.1 F (37.3 C) (03/05 0528) Pulse Rate:  [75-81] 75  (03/05 0528) Resp:  [18] 18  (03/05 0528) BP: (122-135)/(58-65) 130/65 mmHg (03/05 0528) SpO2:  [92 %-99 %] 92 % (03/05 0528) Weight:  [137 lb 2 oz (62.2 kg)] 137 lb 2 oz (62.2 kg) (03/05 0528) I/O last 3 completed shifts: In: 3801.8 [P.O.:1080; I.V.:1671.8; IV Piggyback:1050] Out: 2250 [Urine:2250]  Physical Examination: General:  Sickly wf nad Neuro:  Left blindness, lethargic but no overt deficits.    HEENT:  No jvd. Lt eye as noted.  Cardiovascular:  hsr rrr Lungs:  Normal on R. Posterior crackles in L mid lung field. Prominent egophony anteriorly on L Abdomen:  + bs Musculoskeletal:  Intact, no edema Skin:  No lesions noted      Labs and Imaging:  cxr 3/5 > slt  improved aeration LUL, small effusions   Lab 01/04/12 0653 01/03/12 0624 01/02/12 0845  NA 133* 133* 132*  K 3.8 3.3* 3.5  CL 104 101 100  CO2 23 23 23   BUN 11 8 9   CREATININE 0.57 0.55 0.55  GLUCOSE 103* 88 96    Lab 01/04/12 0653 01/03/12 0624 01/02/12 0845  HGB 7.4* 7.6* 7.3*  HCT 22.2* 22.7*  21.2*  WBC 13.1* 14.7* 16.5*  PLT 576* 492* 432*    Assessment and Plan: Abnormal CXR in setting of acute anterior uveitis: The radiographic appearance and clinical history are most suggestive of bacterial PNA. Sarcoidosis would be the most common pulmonary dx associated with uveitis. Her CXR is not typical of sarcoidosis, however. Plan/Rec:  ABX per ID  Steroids started 3/4 for ? boop > d/c same day by ID - Notes and cxr's reviewed and believe the cap that caused the seeding, if this was the source, is improving and no further pulmonary rx needed for now - please call if specific questions arise.   Uveitis  -Mgmt per Opthalmology    Sandrea Hughs, MD Pulmonary and Critical Care Medicine [facility] Cell [phone]

## 2012-01-04 NOTE — Consult Note (Signed)
Ophthalmology-Retina Jim Desanctis Post procedure day 1:  Post Pars Plana Tap, intravitreal injection and periocular injection of Ceftazidime and Vancomycin.  NLP OS Much reduced proptosis and conjunctival chemosis.  Awaiting lab findings from Micro.  I will likely re-inject the left eye to morrow along with the orbit to insure adequate local treatmnet.  I have discussed prognosis in detail and wish to have her recovered more fully from the abcess in her lower back, lungs, etc.   I will see her late afternoon.orrow.  [name], MD

## 2012-01-04 NOTE — Progress Notes (Signed)
Nutrition Follow-up  Diet Order:  Regular, Ensure Clinical Strength BID Patient states that her appetite was better, but was NPO all days and feels as though she is "back to square one." Patient is trying to drink Ensure when able. Does have some trouble eating at times related to not being able to see. RD observed RN helping patient with snack.   Meds: Scheduled Meds:   . bacitracin-polymyxin b   Left Eye QID  . cefTAZidime (FORTAZ)  IV  1 g Intravenous Q8H  . cefTAZidime  100 mg Subconjunctival Once  . cefTAZidime  2.25 mg Intravitreal Once  . feeding supplement  237 mL Oral BID  . fluconazole (DIFLUCAN) IV  400 mg Intravenous Q24H  . fluconazole (DIFLUCAN) IV  800 mg Intravenous Once  . gadobenate dimeglumine  13 mL Intravenous Once  . gatifloxacin  1 drop Left Eye Q2H while awake  . pantoprazole  40 mg Oral BID AC  . potassium chloride  40 mEq Oral Once  . sodium chloride  3 mL Intravenous Q12H  . vancomycin  750 mg Intravenous Q12H  . vancomycin  0.1 mg Intravitreal Once  . vancomycin  25 mg Subconjunctival Once  . DISCONTD: methylPREDNISolone (SOLU-MEDROL) injection  80 mg Intravenous Q12H  . DISCONTD: moxifloxacin  400 mg Intravenous Q24H   Continuous Infusions:   . sodium chloride 75 mL/hr at 01/04/12 1436   PRN Meds:.acetaminophen, acetaminophen, fentaNYL, HYDROcodone-acetaminophen, ondansetron (ZOFRAN) IV, ondansetron  Labs:  CMP     Component Value Date/Time   NA 133* 01/04/2012 0653   K 3.8 01/04/2012 0653   CL 104 01/04/2012 0653   CO2 23 01/04/2012 0653   GLUCOSE 103* 01/04/2012 0653   BUN 11 01/04/2012 0653   CREATININE 0.57 01/04/2012 0653   CALCIUM 7.7* 01/04/2012 0653   PROT 5.5* 12/31/2011 1155   ALBUMIN 1.5* 12/31/2011 1155   AST 50* 12/31/2011 1155   ALT 35 12/31/2011 1155   ALKPHOS 341* 12/31/2011 1155   BILITOT 1.2 12/31/2011 1155   GFRNONAA >90 01/04/2012 0653   GFRAA >90 01/04/2012 0653     Intake/Output Summary (Last 24 hours) at 01/04/12 1539 Last data filed at  01/04/12 0556  Gross per 24 hour  Intake 1950.5 ml  Output   1700 ml  Net  250.5 ml    Weight Status:  137 lbs, weight has trended up since admission  Re-estimated needs:  1500-1750 kcal, 60-70 gm protein  Nutrition Dx:  Inadequate oral intake, ongoing  Goal:  PO intake of meals and supplements to meet >90% of estimated nutrition needs, unmet  Intervention:   1. Continue with Ensure as previously ordered.  2. Pt was NPO all day, RD took dinner order and placed in [employer] Touch  Monitor:  PO intake, weights, labs, I/O's   [name] Pager #:  [phone]

## 2012-01-04 NOTE — Progress Notes (Signed)
Utilization review completed.  

## 2012-01-04 NOTE — Anesthesia Preprocedure Evaluation (Addendum)
Anesthesia Evaluation  Patient identified by MRN, date of birth, ID band Patient awake    Reviewed: Allergy & Precautions, H&P , NPO status , Patient's Chart, lab work & pertinent test results, reviewed documented beta blocker date and time   Airway Mallampati: II TM Distance: >3 FB Neck ROM: full    Dental  (+) Teeth Intact   Pulmonary pneumonia ,          Cardiovascular negative cardio ROS      Neuro/Psych negative neurological ROS  negative psych ROS   GI/Hepatic negative GI ROS, Neg liver ROS, (+) Hepatitis -, B  Endo/Other  negative endocrine ROS  Renal/GU ARF  negative genitourinary   Musculoskeletal   Abdominal   Peds  Hematology negative hematology ROS (+)   Anesthesia Other Findings See surgeon's H&P   Reproductive/Obstetrics negative OB ROS                          Anesthesia Physical Anesthesia Plan  ASA: III and Emergent  Anesthesia Plan: General   Post-op Pain Management:    Induction: Intravenous  Airway Management Planned: Oral ETT  Additional Equipment:   Intra-op Plan:   Post-operative Plan: Extubation in OR  Informed Consent: I have reviewed the patients History and Physical, chart, labs and discussed the procedure including the risks, benefits and alternatives for the proposed anesthesia with the patient or authorized representative who has indicated his/her understanding and acceptance.     Plan Discussed with: CRNA and Surgeon  Anesthesia Plan Comments:         Anesthesia Quick Evaluation

## 2012-01-04 NOTE — Consult Note (Signed)
Reason for Consult:epidural abscess Referring Physician Primary MD :Donna Tapia is an 61 y.o. female.  HPI:  61 yo female admitted a few days ago with severe eye infection. Noted over subsequent days to have increasing back pain. MRI done today that shows large lumo-sacral epidural abscess and neurosurgery consulted.     Past Medical History  Diagnosis Date  . Hepatitis B     Past Surgical History  Procedure Date  . Abdominal hysterectomy     Family History  Problem Relation Age of Onset  . Coronary artery disease Mother   . Parkinsonism Mother     Social History:  reports that she has never smoked. She does not have any smokeless tobacco history on file. She reports that she does not drink alcohol. Her drug history not on file.  Allergies:  Allergies  Allergen Reactions  . Sulfur     Medications: I have reviewed the patient's current medications.  Results for orders placed during the hospital encounter of 12/30/11 (from the past 48 hour(s))  CBC     Status: Abnormal   Collection Time   01/03/12  6:24 AM      Component Value Range Comment   WBC 14.7 (*) 4.0 - 10.5 (K/uL)    RBC 2.43 (*) 3.87 - 5.11 (MIL/uL)    Hemoglobin 7.6 (*) 12.0 - 15.0 (g/dL)    HCT 16.1 (*) 09.6 - 46.0 (%)    MCV 93.4  78.0 - 100.0 (fL)    MCH 31.3  26.0 - 34.0 (pg)    MCHC 33.5  30.0 - 36.0 (g/dL)    RDW 04.5  40.9 - 81.1 (%)    Platelets 492 (*) 150 - 400 (K/uL)   BASIC METABOLIC PANEL     Status: Abnormal   Collection Time   01/03/12  6:24 AM      Component Value Range Comment   Sodium 133 (*) 135 - 145 (mEq/L)    Potassium 3.3 (*) 3.5 - 5.1 (mEq/L)    Chloride 101  96 - 112 (mEq/L)    CO2 23  19 - 32 (mEq/L)    Glucose, Bld 88  70 - 99 (mg/dL)    BUN 8  6 - 23 (mg/dL)    Creatinine, Ser 9.14  0.50 - 1.10 (mg/dL)    Calcium 7.5 (*) 8.4 - 10.5 (mg/dL)    GFR calc non Af Amer >90  >90 (mL/min)    GFR calc Af Amer >90  >90 (mL/min)   EYE CULTURE     Status: Normal (Preliminary  result)   Collection Time   01/03/12  1:45 PM      Component Value Range Comment   Specimen Description EYE LEFT      Special Requests        Value: VTREOUS PT ON VANC CEFTAZ DO SENS TO ALL GROWTH INCLUDING COAG NEG STAPH PER MD   Culture NO GROWTH      Report Status PENDING     EYE CULTURE     Status: Normal (Preliminary result)   Collection Time   01/03/12  1:45 PM      Component Value Range Comment   Specimen Description EYE LEFT      Special Requests        Value: ANTEROR CHAMBER PT ON VANC CEFTAZ DO SENS TO ALL GROWTH INCLUDING COAG NEG STAPH PER MD   Culture NO GROWTH      Report Status PENDING     CULTURE, SPUTUM-ASSESSMENT  Status: Normal   Collection Time   01/04/12  6:48 AM      Component Value Range Comment   Specimen Description SPUTUM      Special Requests NONE      Sputum evaluation        Value: MICROSCOPIC FINDINGS SUGGEST THAT THIS SPECIMEN IS NOT REPRESENTATIVE OF LOWER RESPIRATORY SECRETIONS. PLEASE RECOLLECT.     CALLED TO [facility] RN 01/04/12 0800 COSTELLO B   Report Status 01/04/2012 FINAL     CBC     Status: Abnormal   Collection Time   01/04/12  6:53 AM      Component Value Range Comment   WBC 13.1 (*) 4.0 - 10.5 (K/uL)    RBC 2.38 (*) 3.87 - 5.11 (MIL/uL)    Hemoglobin 7.4 (*) 12.0 - 15.0 (g/dL)    HCT 16.1 (*) 09.6 - 46.0 (%)    MCV 93.3  78.0 - 100.0 (fL)    MCH 31.1  26.0 - 34.0 (pg)    MCHC 33.3  30.0 - 36.0 (g/dL)    RDW 04.5  40.9 - 81.1 (%)    Platelets 576 (*) 150 - 400 (K/uL)   BASIC METABOLIC PANEL     Status: Abnormal   Collection Time   01/04/12  6:53 AM      Component Value Range Comment   Sodium 133 (*) 135 - 145 (mEq/L)    Potassium 3.8  3.5 - 5.1 (mEq/L)    Chloride 104  96 - 112 (mEq/L)    CO2 23  19 - 32 (mEq/L)    Glucose, Bld 103 (*) 70 - 99 (mg/dL)    BUN 11  6 - 23 (mg/dL)    Creatinine, Ser 9.14  0.50 - 1.10 (mg/dL)    Calcium 7.7 (*) 8.4 - 10.5 (mg/dL)    GFR calc non Af Amer >90  >90 (mL/min)    GFR calc Af Amer >90  >90  (mL/min)   MRSA PCR SCREENING     Status: Normal   Collection Time   01/04/12  4:20 PM      Component Value Range Comment   MRSA by PCR NEGATIVE  NEGATIVE      Dg Chest 2 View  01/04/2012  *RADIOLOGY REPORT*  Clinical Data: Pneumonia.  CHEST - 2 VIEW  Comparison: 12/31/2011  Findings: Continued areas of consolidation throughout the left lung, most confluent in the left upper lobe.  Patchy right upper lobe opacities are again noted.  Biapical scarring.  Heart is normal size.  Bilateral pleural effusions, new since prior study. Increased since prior study.  IMPRESSION: Continued multi focal airspace opacities, likely pneumonia. Enlarging bilateral effusions.  Original Report Authenticated By: Cyndie Chime, M.D.   Mr Cervical Spine W Wo Contrast  01/04/2012  *RADIOLOGY REPORT*  Clinical Data:  Severe pain.  Question epidural abscess.  MRI CERVICAL, THORACIC AND LUMBAR SPINE WITHOUT AND WITH CONTRAST  Technique:  Multiplanar and multiecho pulse sequences of the cervical spine, to include the craniocervical junction and cervicothoracic junction, and thoracic and lumbar spine, were obtained without and with intravenous contrast.  Contrast: 13mL MULTIHANCE GADOBENATE DIMEGLUMINE 529 MG/ML IV SOLN  Comparison:  CT abdomen and pelvis 12/30/2011 and CT chest 12/31/2011.  MRI CERVICAL SPINE  Findings:  Patient motion somewhat degrades study.  Vertebral body height, signal and alignment are maintained.  There is no epidural abscess.  No evidence of diskitis/osteomyelitis.  Cervical cord signal is normal and the craniocervical junction is normal. Paraspinous  structures are unremarkable.  C2-3:  Negative.  C3-4:  Mild disc bulging uncovertebral disease.  Central canal is open.  Foramina are mildly narrowed.  C4-5:  Facet arthropathy and a mild disc bulge.  Central canal and foramina are open.  C5-6:  Disc bulge effaces the ventral thecal sac and causes some deformity of the cord.  Moderate bilateral foraminal narrowing  is present.  C6-7:  Disc bulge contacts the cord without deforming it.  There is mild to moderate left foraminal narrowing the right foramen open.  C7-T1:  Negative.  IMPRESSION:  1.  Negative for epidural abscess or other infectious process. 2.  Degenerative disease most notable at C5-6 where a disc bulge causes some deformity of the ventral cord and there is moderate bilateral foraminal narrowing.  MRI THORACIC SPINE  Findings: Vertebral body height, signal and alignment are maintained.  There is no epidural abscess and no evidence of diskitis/osteomyelitis.  The thoracic cord appears normal. Paraspinous structures demonstrate extensive airspace disease on the left.  Patchy airspace disease in the right upper lobe is also noted.  There are left worse than right pleural effusions.  No disc bulge or protrusion is identified.  Central canal and foramina are open at all levels.  IMPRESSION:  1.  Negative for epidural abscess or diskitis/osteomyelitis. 2.  Left much worse than right airspace disease consistent with pneumonia.  Associated left greater than right pleural effusions also identified.  MRI LUMBAR SPINE  Findings: There is fluid in the disc interspace at L3-4 with edema and enhancement throughout the L3 and L4 vertebral bodies.  Also seen is marked marrow edema about the right L4-5 facet with post contrast enhancement.  A large epidural abscess is identified posterior to the thecal sac extending from the inferior aspect of L4 to the S1-2 level.  The abscess measures approximately 5.6 cm cranial-caudal by up to 1.6 cm transverse by 1.1 cm AP.  There is mass effect on the thecal sac, worst at L5-S1 where the sac is markedly compressed.  Given its posterior position, the abscess likely originates from the right L4-5 facet.  There is also a left psoas abscess which is incompletely visualized and measures approximately 2.2 cm transverse by 2.7 cm AP by 6 cm cranial- caudal.  A large hemangioma is noted in the L1  vertebral body.  There is no fracture or subluxation of the lumbar spine.  L1-2:  Disc bulge eccentric to the right without central canal stenosis.  There is some narrowing of the right lateral recess and foramen.  L2-3:  Disc bulge and ligamentum flavum thickening. There appears to be a small extruded disc fragment in the left lateral recess which could encroach on the descending left L3 root.  Finding could be due to a tiny epidural abscess although this is felt less likely.  L3-4:  No disc bulge or protrusion.  L4-5:  Disc bulge is identified.  No central canal stenosis due to disc.  Foramina are mildly narrowed.  L5-S1:  Negative for disc bulge or protrusion.  IMPRESSION:  1.  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  Large and posterior epidural abscess compresses the thecal sac from the L4 level to S1-2 as described above.  Also seen is a large left psoas abscess. Critical Value/emergent results were called by telephone at the time of interpretation on 01/04/2012  at 2:10 p.m.  to  Dr. Luciana Axe, who verbally acknowledged these results. 2.  Likely extruded disc fragment in the left lateral recess at L2- 3 could impact the descending left L3 root.  Original Report Authenticated By: Bernadene Bell. Maricela Curet, M.D.   Mr Thoracic Spine W Wo Contrast  01/04/2012  *RADIOLOGY REPORT*  Clinical Data:  Severe pain.  Question epidural abscess.  MRI CERVICAL, THORACIC AND LUMBAR SPINE WITHOUT AND WITH CONTRAST  Technique:  Multiplanar and multiecho pulse sequences of the cervical spine, to include the craniocervical junction and cervicothoracic junction, and thoracic and lumbar spine, were obtained without and with intravenous contrast.  Contrast: 13mL MULTIHANCE GADOBENATE DIMEGLUMINE 529 MG/ML IV SOLN  Comparison:  CT abdomen and pelvis 12/30/2011 and CT chest 12/31/2011.  MRI CERVICAL SPINE  Findings:  Patient motion somewhat degrades study.  Vertebral body height,  signal and alignment are maintained.  There is no epidural abscess.  No evidence of diskitis/osteomyelitis.  Cervical cord signal is normal and the craniocervical junction is normal. Paraspinous structures are unremarkable.  C2-3:  Negative.  C3-4:  Mild disc bulging uncovertebral disease.  Central canal is open.  Foramina are mildly narrowed.  C4-5:  Facet arthropathy and a mild disc bulge.  Central canal and foramina are open.  C5-6:  Disc bulge effaces the ventral thecal sac and causes some deformity of the cord.  Moderate bilateral foraminal narrowing is present.  C6-7:  Disc bulge contacts the cord without deforming it.  There is mild to moderate left foraminal narrowing the right foramen open.  C7-T1:  Negative.  IMPRESSION:  1.  Negative for epidural abscess or other infectious process. 2.  Degenerative disease most notable at C5-6 where a disc bulge causes some deformity of the ventral cord and there is moderate bilateral foraminal narrowing.  MRI THORACIC SPINE  Findings: Vertebral body height, signal and alignment are maintained.  There is no epidural abscess and no evidence of diskitis/osteomyelitis.  The thoracic cord appears normal. Paraspinous structures demonstrate extensive airspace disease on the left.  Patchy airspace disease in the right upper lobe is also noted.  There are left worse than right pleural effusions.  No disc bulge or protrusion is identified.  Central canal and foramina are open at all levels.  IMPRESSION:  1.  Negative for epidural abscess or diskitis/osteomyelitis. 2.  Left much worse than right airspace disease consistent with pneumonia.  Associated left greater than right pleural effusions also identified.  MRI LUMBAR SPINE  Findings: There is fluid in the disc interspace at L3-4 with edema and enhancement throughout the L3 and L4 vertebral bodies.  Also seen is marked marrow edema about the right L4-5 facet with post contrast enhancement.  A large epidural abscess is identified  posterior to the thecal sac extending from the inferior aspect of L4 to the S1-2 level.  The abscess measures approximately 5.6 cm cranial-caudal by up to 1.6 cm transverse by 1.1 cm AP.  There is mass effect on the thecal sac, worst at L5-S1 where the sac is markedly compressed.  Given its posterior position, the abscess likely originates from the right L4-5 facet.  There is also a left psoas abscess which is incompletely visualized and measures approximately 2.2 cm transverse by 2.7 cm AP by 6 cm cranial- caudal.  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  There is no fracture or subluxation of the lumbar spine.  L1-2:  Disc bulge eccentric to the right without central canal stenosis.  There is some narrowing of the right lateral recess and foramen.  L2-3:  Disc bulge and ligamentum flavum thickening. There appears to be a small extruded disc fragment in the left lateral recess which could encroach on the descending left L3 root.  Finding could be due to a tiny epidural abscess although this is felt less likely.  L3-4:  No disc bulge or protrusion.  L4-5:  Disc bulge is identified.  No central canal stenosis due to disc.  Foramina are mildly narrowed.  L5-S1:  Negative for disc bulge or protrusion.  IMPRESSION:  1.  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  Large and posterior epidural abscess compresses the thecal sac from the L4 level to S1-2 as described above.  Also seen is a large left psoas abscess. Critical Value/emergent results were called by telephone at the time of interpretation on 01/04/2012  at 2:10 p.m.  to  Dr. Luciana Axe, who verbally acknowledged these results. 2.  Likely extruded disc fragment in the left lateral recess at L2- 3 could impact the descending left L3 root.  Original Report Authenticated By: Bernadene Bell. Maricela Curet, M.D.   Mr Lumbar Spine W Wo Contrast  01/04/2012  *RADIOLOGY REPORT*  Clinical Data:  Severe pain.   Question epidural abscess.  MRI CERVICAL, THORACIC AND LUMBAR SPINE WITHOUT AND WITH CONTRAST  Technique:  Multiplanar and multiecho pulse sequences of the cervical spine, to include the craniocervical junction and cervicothoracic junction, and thoracic and lumbar spine, were obtained without and with intravenous contrast.  Contrast: 13mL MULTIHANCE GADOBENATE DIMEGLUMINE 529 MG/ML IV SOLN  Comparison:  CT abdomen and pelvis 12/30/2011 and CT chest 12/31/2011.  MRI CERVICAL SPINE  Findings:  Patient motion somewhat degrades study.  Vertebral body height, signal and alignment are maintained.  There is no epidural abscess.  No evidence of diskitis/osteomyelitis.  Cervical cord signal is normal and the craniocervical junction is normal. Paraspinous structures are unremarkable.  C2-3:  Negative.  C3-4:  Mild disc bulging uncovertebral disease.  Central canal is open.  Foramina are mildly narrowed.  C4-5:  Facet arthropathy and a mild disc bulge.  Central canal and foramina are open.  C5-6:  Disc bulge effaces the ventral thecal sac and causes some deformity of the cord.  Moderate bilateral foraminal narrowing is present.  C6-7:  Disc bulge contacts the cord without deforming it.  There is mild to moderate left foraminal narrowing the right foramen open.  C7-T1:  Negative.  IMPRESSION:  1.  Negative for epidural abscess or other infectious process. 2.  Degenerative disease most notable at C5-6 where a disc bulge causes some deformity of the ventral cord and there is moderate bilateral foraminal narrowing.  MRI THORACIC SPINE  Findings: Vertebral body height, signal and alignment are maintained.  There is no epidural abscess and no evidence of diskitis/osteomyelitis.  The thoracic cord appears normal. Paraspinous structures demonstrate extensive airspace disease on the left.  Patchy airspace disease in the right upper lobe is also noted.  There are left worse than right pleural effusions.  No disc bulge or protrusion is  identified.  Central canal and foramina are open at all levels.  IMPRESSION:  1.  Negative for epidural abscess or diskitis/osteomyelitis. 2.  Left much worse than right airspace disease consistent with pneumonia.  Associated left greater than right pleural effusions also identified.  MRI LUMBAR SPINE  Findings: There is fluid in the disc interspace at L3-4 with edema and enhancement throughout the L3 and L4 vertebral bodies.  Also seen is marked  marrow edema about the right L4-5 facet with post contrast enhancement.  A large epidural abscess is identified posterior to the thecal sac extending from the inferior aspect of L4 to the S1-2 level.  The abscess measures approximately 5.6 cm cranial-caudal by up to 1.6 cm transverse by 1.1 cm AP.  There is mass effect on the thecal sac, worst at L5-S1 where the sac is markedly compressed.  Given its posterior position, the abscess likely originates from the right L4-5 facet.  There is also a left psoas abscess which is incompletely visualized and measures approximately 2.2 cm transverse by 2.7 cm AP by 6 cm cranial- caudal.  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  There is no fracture or subluxation of the lumbar spine.  L1-2:  Disc bulge eccentric to the right without central canal stenosis.  There is some narrowing of the right lateral recess and foramen.  L2-3:  Disc bulge and ligamentum flavum thickening. There appears to be a small extruded disc fragment in the left lateral recess which could encroach on the descending left L3 root.  Finding could be due to a tiny epidural abscess although this is felt less likely.  L3-4:  No disc bulge or protrusion.  L4-5:  Disc bulge is identified.  No central canal stenosis due to disc.  Foramina are mildly narrowed.  L5-S1:  Negative for disc bulge or protrusion.  IMPRESSION:  1.  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  Large and posterior epidural abscess  compresses the thecal sac from the L4 level to S1-2 as described above.  Also seen is a large left psoas abscess. Critical Value/emergent results were called by telephone at the time of interpretation on 01/04/2012  at 2:10 p.m.  to  Dr. Luciana Axe, who verbally acknowledged these results. 2.  Likely extruded disc fragment in the left lateral recess at L2- 3 could impact the descending left L3 root.  Original Report Authenticated By: Bernadene Bell. Maricela Curet, M.D.    Review of systems not obtained due to patient factors. Blood pressure 139/71, pulse 86, temperature 98.4 F (36.9 C), temperature source Oral, resp. rate 19, height 5\' 9"  (1.753 m), weight 62.2 kg (137 lb 2 oz), SpO2 94.00%. The patient is awake, alert, and conversnat, but she is totally fixated on her dry throat. She has to be kept on topic, but she does c/o back pain. She can follow complex commands with good strength.   Assessment/Plan: Lumbo-sacral abscess. Plan is for surgery for evacuation. The collection is quite large, and surgery is needed. Unfortunately, she has low H and H, and has developed an infiltrate. Even with these risk factors, the need for surgery supercedes them, and we will therefore proceed with an understanding that the risk of her getting quite sick post op is quite high.  Reinaldo Meeker, MD 01/04/2012, 6:00 PM

## 2012-01-04 NOTE — Brief Op Note (Signed)
12/30/2011 - 01/04/2012  10:37 PM  PATIENT:  Donna Tapia  61 y.o. female  PRE-OPERATIVE DIAGNOSIS:  Lumbar Wound Epidural Abscess  POST-OPERATIVE DIAGNOSIS:  Lumbar Wound Epidural Abscess  PROCEDURE:  Procedure(s) (LRB): LUMBAR LAMINECTOMY/DECOMPRESSION MICRODISCECTOMY 1 LEVEL (N/A)  SURGEON:  Surgeon(s) and Role:    * Reinaldo Meeker, MD - Primary  PHYSICIAN ASSISTANT:   ASSISTANTS: none   ANESTHESIA:   general  EBL:  Total I/O In: 1700 [I.V.:1000; Blood:700] Out: 400 [Urine:400]  BLOOD ADMINISTERED:2units CC PRBC  DRAINS: (medium) Hemovact drain(s) in the epidural with  Suction Open   LOCAL MEDICATIONS USED:  NONE  SPECIMEN:  No Specimen  DISPOSITION OF SPECIMEN:  N/A  COUNTS:  YES  TOURNIQUET:  * No tourniquets in log *  DICTATION: .Dragon Dictation  PLAN OF CARE: Admit to inpatient   PATIENT DISPOSITION:  PACU - hemodynamically stable.   Delay start of Pharmacological VTE agent (>24hrs) due to surgical blood loss or risk of bleeding: yes

## 2012-01-04 NOTE — Progress Notes (Signed)
Subjective: Patient seen and examined this am. 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 Still has low back pain and feels weak. NPO for TEE.  I was informed by ID later during the day that she had epidural abscess involving L4-S2 on MRI and left sided psoas abscess . discussed with IR who will drain the psoas abscess tomorrow morning. Called neurosurgery for consult for her  epidural abscess  Objective:  Vital signs in last 24 hours:  Filed Vitals:   01/03/12 1340 01/03/12 2112 01/04/12 0528 01/04/12 1431  BP: 122/58 135/64 130/65 139/71  Pulse: 80 81 75 86  Temp: 98.2 F (36.8 C) 99.1 F (37.3 C) 99.1 F (37.3 C) 98.4 F (36.9 C)  TempSrc: Oral Oral    Resp: 18 18 18 19   Height:      Weight:   62.2 kg (137 lb 2 oz)   SpO2: 99% 94% 92% 94%    Intake/Output from previous day:   Intake/Output Summary (Last 24 hours) at 01/04/12 1613 Last data filed at 01/04/12 0556  Gross per 24 hour  Intake 1950.5 ml  Output   1700 ml  Net  250.5 ml    Physical Exam:  General: elderly thin built female in NAD  HEENT: significant swelling of left orbit and eyelid and unable to open the eye. no pallor, no icterus, moist oral mucosa, no JVD, no lymphadenopathy. Herpetic lesions over rt lower lip  Heart: Normal s1 &s2 Regular rate and rhythm, without murmurs, rubs, gallops.  Lungs: crackles over left side  Abdomen: Soft, nontender, nondistended, positive bowel sounds.  Extremities: erythema with warmth and tenderness over left shoulder with pain on palpation and limited ROM, appears involving superficially skin and [place] tissue only, no joint tenderness, No clubbing cyanosis or edema with positive pedal pulses.  Neuro: Alert, awake, oriented x3, nonfocal.    Lab Results:  Basic Metabolic Panel:    Component Value Date/Time   NA 133* 01/04/2012 0653   K 3.8 01/04/2012 0653   CL 104 01/04/2012 0653   CO2 23 01/04/2012 0653   BUN 11 01/04/2012 0653   CREATININE 0.57 01/04/2012 0653   GLUCOSE 103* 01/04/2012 0653   CALCIUM 7.7* 01/04/2012 0653   CBC:    Component Value Date/Time   WBC 13.1* 01/04/2012 0653   HGB 7.4* 01/04/2012 0653   HCT 22.2* 01/04/2012 0653   PLT 576* 01/04/2012 0653   MCV 93.3 01/04/2012 0653   NEUTROABS 15.1* 12/31/2011 1155   LYMPHSABS 0.8 12/31/2011 1155   MONOABS 0.5 12/31/2011 1155   EOSABS 0.0 12/31/2011 1155   BASOSABS 0.0 12/31/2011 1155    Recent Results (from the past 240 hour(s))  URINE CULTURE     Status: Normal   Collection Time   12/31/11  4:15 AM      Component Value Range Status Comment   Specimen Description URINE, CLEAN CATCH   Final    Special Requests NONE   Final    Culture  Setup Time [id number]   Final    Colony Count NO GROWTH   Final    Culture NO GROWTH   Final    Report Status 01/01/2012 FINAL   Final   CULTURE, BLOOD (ROUTINE X 2)     Status: Normal (Preliminary result)   Collection Time   12/31/11  8:42 AM      Component Value Range Status Comment   Specimen Description BLOOD LEFT ARM   Final  Special Requests BOTTLES DRAWN AEROBIC ONLY 10CC   Final    Culture  Setup Time [id number]   Final    Culture     Final    Value:        BLOOD CULTURE RECEIVED NO GROWTH TO DATE CULTURE WILL BE HELD FOR 5 DAYS BEFORE ISSUING A FINAL NEGATIVE REPORT   Report Status PENDING   Incomplete   CULTURE, BLOOD (ROUTINE X 2)     Status: Normal (Preliminary result)   Collection Time   12/31/11  8:48 AM      Component Value Range Status Comment   Specimen Description BLOOD LEFT HAND   Final    Special Requests BOTTLES DRAWN AEROBIC ONLY 10CC   Final    Culture  Setup Time [id number]   Final    Culture     Final    Value:        BLOOD CULTURE RECEIVED NO GROWTH TO DATE CULTURE WILL BE HELD FOR 5 DAYS BEFORE ISSUING A FINAL NEGATIVE REPORT   Report Status PENDING   Incomplete   CULTURE, SPUTUM-ASSESSMENT     Status: Normal   Collection Time   12/31/11  2:15 PM      Component Value Range Status Comment    Specimen Description SPUTUM   Final    Special Requests NONE   Final    Sputum evaluation     Final    Value: THIS SPECIMEN IS ACCEPTABLE. RESPIRATORY CULTURE REPORT TO FOLLOW.   Report Status 12/31/2011 FINAL   Final   CULTURE, RESPIRATORY     Status: Normal   Collection Time   12/31/11  2:15 PM      Component Value Range Status Comment   Specimen Description SPUTUM   Final    Special Requests NONE   Final    Gram Stain     Final    Value: NO WBC SEEN     NO SQUAMOUS EPITHELIAL CELLS SEEN     FEW YEAST   Culture NORMAL OROPHARYNGEAL FLORA   Final    Report Status 01/03/2012 FINAL   Final   STOOL CULTURE     Status: Normal   Collection Time   12/31/11  2:45 PM      Component Value Range Status Comment   Specimen Description STOOL   Final    Special Requests NONE   Final    Culture     Final    Value: NO SALMONELLA, SHIGELLA, CAMPYLOBACTER, OR YERSINIA ISOLATED   Report Status 01/04/2012 FINAL   Final   CLOSTRIDIUM DIFFICILE BY PCR     Status: Normal   Collection Time   01/02/12 11:29 AM      Component Value Range Status Comment   C difficile by pcr NEGATIVE  NEGATIVE  Final   EYE CULTURE     Status: Normal (Preliminary result)   Collection Time   01/03/12  1:45 PM      Component Value Range Status Comment   Specimen Description EYE LEFT   Final    Special Requests     Final    Value: VTREOUS PT ON VANC CEFTAZ DO SENS TO ALL GROWTH INCLUDING COAG NEG STAPH PER MD   Culture NO GROWTH   Final    Report Status PENDING   Incomplete   EYE CULTURE     Status: Normal (Preliminary result)   Collection Time   01/03/12  1:45 PM      Component Value Range  Status Comment   Specimen Description EYE LEFT   Final    Special Requests     Final    Value: ANTEROR CHAMBER PT ON VANC CEFTAZ DO SENS TO ALL GROWTH INCLUDING COAG NEG STAPH PER MD   Culture NO GROWTH   Final    Report Status PENDING   Incomplete   CULTURE, SPUTUM-ASSESSMENT     Status: Normal   Collection Time   01/04/12  6:48 AM       Component Value Range Status Comment   Specimen Description SPUTUM   Final    Special Requests NONE   Final    Sputum evaluation     Final    Value: MICROSCOPIC FINDINGS SUGGEST THAT THIS SPECIMEN IS NOT REPRESENTATIVE OF LOWER RESPIRATORY SECRETIONS. PLEASE RECOLLECT.     CALLED TO [facility] RN 01/04/12 0800 COSTELLO B   Report Status 01/04/2012 FINAL   Final     Studies/Results: Dg Chest 2 View  01/04/2012  *RADIOLOGY REPORT*  Clinical Data: Pneumonia.  CHEST - 2 VIEW  Comparison: 12/31/2011  Findings: Continued areas of consolidation throughout the left lung, most confluent in the left upper lobe.  Patchy right upper lobe opacities are again noted.  Biapical scarring.  Heart is normal size.  Bilateral pleural effusions, new since prior study. Increased since prior study.  IMPRESSION: Continued multi focal airspace opacities, likely pneumonia. Enlarging bilateral effusions.  Original Report Authenticated By: Cyndie Chime, M.D.   Mr [name] Wo Contrast  01/02/2012  *RADIOLOGY REPORT*  Clinical Data:  Swollen painful left eye with decreased vision. Left eye blindness.  MRI HEAD AND ORBITS WITHOUT AND WITH CONTRAST  Technique:  Multiplanar, multiecho pulse sequences of the brain and surrounding structures were obtained without and with intravenous contrast. Multiplanar, multiecho pulse sequences of the orbits and surrounding structures were obtained including fat saturation techniques, before and after intravenous contrast administration.  Contrast: 13 ml Multihance IV  Comparison:  CT 01/01/2012  MRI HEAD  Findings:  Negative for acute infarct.  No significant chronic ischemia.  Negative for intracranial mass or hemorrhage. Ventricles are normal in size.  Paranasal sinuses are clear.  Extensive inflammation is present in the left orbit with proptosis. There is edema of the sclera extending into the orbital fat.  See separate report of the orbit.  The diffusion weighted imaging is positive throughout the  sclera suggesting acute infection.  Postcontrast imaging of the brain reveals normal enhancement.  No evidence of brain abscess or meningeal thickening.  IMPRESSION: No significant intracranial infection.   Left orbital edema.  MRI ORBITS  Findings: Extensive orbital edema is present on the left.  There is proptosis which has progressed from the prior study yesterday. There is a thickened shaggy sclera on the left globe which enhances diffusely.  There is considerable enhancement surrounding the distal optic nerve and surrounding   intraconal orbital fat.  This has progressed considerably from the CT.  There is also edema and enhancement in the subcutaneous tissues of the eye lid and surrounding soft tissues.  Axial postcontrast images raise the possibility of a small abscess in the superior medial quadrant of the left orbit.  However, I believe this is some fat with surrounding inflammation based on the T2 and coronal T1  images.  I also considered  the possibility septic thrombophlebitis of the superior orbital vein however I do not think this is present at this time.  The cavernous sinus appears symmetric and normal bilaterally.  No  significant sinusitis is present.  The right orbit is normal.  IMPRESSION: Progression of left orbital edema since yesterday.  There is thickening with shaggy enhancement of the sclera of the eye on the left.  There is enhancement in the intraconal orbital fat especially around the optic nerve.  No definite abscess is identified at this time.  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  I discussed the findings by telephone with Dr. Daiva Eves on 01/02/2012 at 1500 hours.  I have been unable to reach Dr. Randon Goldsmith who is not on call.  Original Report Authenticated By: Camelia Phenes, M.D.   Mr Cervical Spine W Wo Contrast  01/04/2012  *RADIOLOGY REPORT*  Clinical Data:  Severe pain.  Question epidural abscess.  MRI CERVICAL, THORACIC AND LUMBAR SPINE WITHOUT AND WITH CONTRAST   Technique:  Multiplanar and multiecho pulse sequences of the cervical spine, to include the craniocervical junction and cervicothoracic junction, and thoracic and lumbar spine, were obtained without and with intravenous contrast.  Contrast: 13mL MULTIHANCE GADOBENATE DIMEGLUMINE 529 MG/ML IV SOLN  Comparison:  CT abdomen and pelvis 12/30/2011 and CT chest 12/31/2011.  MRI CERVICAL SPINE  Findings:  Patient motion somewhat degrades study.  Vertebral body height, signal and alignment are maintained.  There is no epidural abscess.  No evidence of diskitis/osteomyelitis.  Cervical cord signal is normal and the craniocervical junction is normal. Paraspinous structures are unremarkable.  C2-3:  Negative.  C3-4:  Mild disc bulging uncovertebral disease.  Central canal is open.  Foramina are mildly narrowed.  C4-5:  Facet arthropathy and a mild disc bulge.  Central canal and foramina are open.  C5-6:  Disc bulge effaces the ventral thecal sac and causes some deformity of the cord.  Moderate bilateral foraminal narrowing is present.  C6-7:  Disc bulge contacts the cord without deforming it.  There is mild to moderate left foraminal narrowing the right foramen open.  C7-T1:  Negative.  IMPRESSION:  1.  Negative for epidural abscess or other infectious process. 2.  Degenerative disease most notable at C5-6 where a disc bulge causes some deformity of the ventral cord and there is moderate bilateral foraminal narrowing.  MRI THORACIC SPINE  Findings: Vertebral body height, signal and alignment are maintained.  There is no epidural abscess and no evidence of diskitis/osteomyelitis.  The thoracic cord appears normal. Paraspinous structures demonstrate extensive airspace disease on the left.  Patchy airspace disease in the right upper lobe is also noted.  There are left worse than right pleural effusions.  No disc bulge or protrusion is identified.  Central canal and foramina are open at all levels.  IMPRESSION:  1.  Negative for  epidural abscess or diskitis/osteomyelitis. 2.  Left much worse than right airspace disease consistent with pneumonia.  Associated left greater than right pleural effusions also identified.  MRI LUMBAR SPINE  Findings: There is fluid in the disc interspace at L3-4 with edema and enhancement throughout the L3 and L4 vertebral bodies.  Also seen is marked marrow edema about the right L4-5 facet with post contrast enhancement.  A large epidural abscess is identified posterior to the thecal sac extending from the inferior aspect of L4 to the S1-2 level.  The abscess measures approximately 5.6 cm cranial-caudal by up to 1.6 cm transverse by 1.1 cm AP.  There is mass effect on the thecal sac, worst at L5-S1 where the sac is markedly compressed.  Given its posterior position, the abscess likely originates from the right L4-5 facet.  There  is also a left psoas abscess which is incompletely visualized and measures approximately 2.2 cm transverse by 2.7 cm AP by 6 cm cranial- caudal.  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  There is no fracture or subluxation of the lumbar spine.  L1-2:  Disc bulge eccentric to the right without central canal stenosis.  There is some narrowing of the right lateral recess and foramen.  L2-3:  Disc bulge and ligamentum flavum thickening. There appears to be a small extruded disc fragment in the left lateral recess which could encroach on the descending left L3 root.  Finding could be due to a tiny epidural abscess although this is felt less likely.  L3-4:  No disc bulge or protrusion.  L4-5:  Disc bulge is identified.  No central canal stenosis due to disc.  Foramina are mildly narrowed.  L5-S1:  Negative for disc bulge or protrusion.  IMPRESSION:  1.  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  Large and posterior epidural abscess compresses the thecal sac from the L4 level to S1-2 as described above.  Also seen is a large  left psoas abscess. Critical Value/emergent results were called by telephone at the time of interpretation on 01/04/2012  at 2:10 p.m.  to  Dr. Luciana Axe, who verbally acknowledged these results. 2.  Likely extruded disc fragment in the left lateral recess at L2- 3 could impact the descending left L3 root.  Original Report Authenticated By: Bernadene Bell. Maricela Curet, M.D.   Mr Thoracic Spine W Wo Contrast  01/04/2012  *RADIOLOGY REPORT*  Clinical Data:  Severe pain.  Question epidural abscess.  MRI CERVICAL, THORACIC AND LUMBAR SPINE WITHOUT AND WITH CONTRAST  Technique:  Multiplanar and multiecho pulse sequences of the cervical spine, to include the craniocervical junction and cervicothoracic junction, and thoracic and lumbar spine, were obtained without and with intravenous contrast.  Contrast: 13mL MULTIHANCE GADOBENATE DIMEGLUMINE 529 MG/ML IV SOLN  Comparison:  CT abdomen and pelvis 12/30/2011 and CT chest 12/31/2011.  MRI CERVICAL SPINE  Findings:  Patient motion somewhat degrades study.  Vertebral body height, signal and alignment are maintained.  There is no epidural abscess.  No evidence of diskitis/osteomyelitis.  Cervical cord signal is normal and the craniocervical junction is normal. Paraspinous structures are unremarkable.  C2-3:  Negative.  C3-4:  Mild disc bulging uncovertebral disease.  Central canal is open.  Foramina are mildly narrowed.  C4-5:  Facet arthropathy and a mild disc bulge.  Central canal and foramina are open.  C5-6:  Disc bulge effaces the ventral thecal sac and causes some deformity of the cord.  Moderate bilateral foraminal narrowing is present.  C6-7:  Disc bulge contacts the cord without deforming it.  There is mild to moderate left foraminal narrowing the right foramen open.  C7-T1:  Negative.  IMPRESSION:  1.  Negative for epidural abscess or other infectious process. 2.  Degenerative disease most notable at C5-6 where a disc bulge causes some deformity of the ventral cord and there is  moderate bilateral foraminal narrowing.  MRI THORACIC SPINE  Findings: Vertebral body height, signal and alignment are maintained.  There is no epidural abscess and no evidence of diskitis/osteomyelitis.  The thoracic cord appears normal. Paraspinous structures demonstrate extensive airspace disease on the left.  Patchy airspace disease in the right upper lobe is also noted.  There are left worse than right pleural effusions.  No disc bulge or protrusion is  identified.  Central canal and foramina are open at all levels.  IMPRESSION:  1.  Negative for epidural abscess or diskitis/osteomyelitis. 2.  Left much worse than right airspace disease consistent with pneumonia.  Associated left greater than right pleural effusions also identified.  MRI LUMBAR SPINE  Findings: There is fluid in the disc interspace at L3-4 with edema and enhancement throughout the L3 and L4 vertebral bodies.  Also seen is marked marrow edema about the right L4-5 facet with post contrast enhancement.  A large epidural abscess is identified posterior to the thecal sac extending from the inferior aspect of L4 to the S1-2 level.  The abscess measures approximately 5.6 cm cranial-caudal by up to 1.6 cm transverse by 1.1 cm AP.  There is mass effect on the thecal sac, worst at L5-S1 where the sac is markedly compressed.  Given its posterior position, the abscess likely originates from the right L4-5 facet.  There is also a left psoas abscess which is incompletely visualized and measures approximately 2.2 cm transverse by 2.7 cm AP by 6 cm cranial- caudal.  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  There is no fracture or subluxation of the lumbar spine.  L1-2:  Disc bulge eccentric to the right without central canal stenosis.  There is some narrowing of the right lateral recess and foramen.  L2-3:  Disc bulge and ligamentum flavum thickening. There appears to be a small extruded disc fragment in the left lateral recess which could  encroach on the descending left L3 root.  Finding could be due to a tiny epidural abscess although this is felt less likely.  L3-4:  No disc bulge or protrusion.  L4-5:  Disc bulge is identified.  No central canal stenosis due to disc.  Foramina are mildly narrowed.  L5-S1:  Negative for disc bulge or protrusion.  IMPRESSION:  1.  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  Large and posterior epidural abscess compresses the thecal sac from the L4 level to S1-2 as described above.  Also seen is a large left psoas abscess. Critical Value/emergent results were called by telephone at the time of interpretation on 01/04/2012  at 2:10 p.m.  to  Dr. Luciana Axe, who verbally acknowledged these results. 2.  Likely extruded disc fragment in the left lateral recess at L2- 3 could impact the descending left L3 root.  Original Report Authenticated By: Bernadene Bell. Maricela Curet, M.D.   Mr Lumbar Spine W Wo Contrast  01/04/2012  *RADIOLOGY REPORT*  Clinical Data:  Severe pain.  Question epidural abscess.  MRI CERVICAL, THORACIC AND LUMBAR SPINE WITHOUT AND WITH CONTRAST  Technique:  Multiplanar and multiecho pulse sequences of the cervical spine, to include the craniocervical junction and cervicothoracic junction, and thoracic and lumbar spine, were obtained without and with intravenous contrast.  Contrast: 13mL MULTIHANCE GADOBENATE DIMEGLUMINE 529 MG/ML IV SOLN  Comparison:  CT abdomen and pelvis 12/30/2011 and CT chest 12/31/2011.  MRI CERVICAL SPINE  Findings:  Patient motion somewhat degrades study.  Vertebral body height, signal and alignment are maintained.  There is no epidural abscess.  No evidence of diskitis/osteomyelitis.  Cervical cord signal is normal and the craniocervical junction is normal. Paraspinous structures are unremarkable.  C2-3:  Negative.  C3-4:  Mild disc bulging uncovertebral disease.  Central canal is open.  Foramina are mildly narrowed.  C4-5:  Facet  arthropathy and a mild disc bulge.  Central canal and foramina  are open.  C5-6:  Disc bulge effaces the ventral thecal sac and causes some deformity of the cord.  Moderate bilateral foraminal narrowing is present.  C6-7:  Disc bulge contacts the cord without deforming it.  There is mild to moderate left foraminal narrowing the right foramen open.  C7-T1:  Negative.  IMPRESSION:  1.  Negative for epidural abscess or other infectious process. 2.  Degenerative disease most notable at C5-6 where a disc bulge causes some deformity of the ventral cord and there is moderate bilateral foraminal narrowing.  MRI THORACIC SPINE  Findings: Vertebral body height, signal and alignment are maintained.  There is no epidural abscess and no evidence of diskitis/osteomyelitis.  The thoracic cord appears normal. Paraspinous structures demonstrate extensive airspace disease on the left.  Patchy airspace disease in the right upper lobe is also noted.  There are left worse than right pleural effusions.  No disc bulge or protrusion is identified.  Central canal and foramina are open at all levels.  IMPRESSION:  1.  Negative for epidural abscess or diskitis/osteomyelitis. 2.  Left much worse than right airspace disease consistent with pneumonia.  Associated left greater than right pleural effusions also identified.  MRI LUMBAR SPINE  Findings: There is fluid in the disc interspace at L3-4 with edema and enhancement throughout the L3 and L4 vertebral bodies.  Also seen is marked marrow edema about the right L4-5 facet with post contrast enhancement.  A large epidural abscess is identified posterior to the thecal sac extending from the inferior aspect of L4 to the S1-2 level.  The abscess measures approximately 5.6 cm cranial-caudal by up to 1.6 cm transverse by 1.1 cm AP.  There is mass effect on the thecal sac, worst at L5-S1 where the sac is markedly compressed.  Given its posterior position, the abscess likely originates from the right  L4-5 facet.  There is also a left psoas abscess which is incompletely visualized and measures approximately 2.2 cm transverse by 2.7 cm AP by 6 cm cranial- caudal.  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  There is no fracture or subluxation of the lumbar spine.  L1-2:  Disc bulge eccentric to the right without central canal stenosis.  There is some narrowing of the right lateral recess and foramen.  L2-3:  Disc bulge and ligamentum flavum thickening. There appears to be a small extruded disc fragment in the left lateral recess which could encroach on the descending left L3 root.  Finding could be due to a tiny epidural abscess although this is felt less likely.  L3-4:  No disc bulge or protrusion.  L4-5:  Disc bulge is identified.  No central canal stenosis due to disc.  Foramina are mildly narrowed.  L5-S1:  Negative for disc bulge or protrusion.  IMPRESSION:  1.  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  Large and posterior epidural abscess compresses the thecal sac from the L4 level to S1-2 as described above.  Also seen is a large left psoas abscess. Critical Value/emergent results were called by telephone at the time of interpretation on 01/04/2012  at 2:10 p.m.  to  Dr. Luciana Axe, who verbally acknowledged these results. 2.  Likely extruded disc fragment in the left lateral recess at L2- 3 could impact the descending left L3 root.  Original Report Authenticated By: Bernadene Bell. Maricela Curet, M.D.   Mr Orbits Wo/w Cm  01/02/2012  *RADIOLOGY REPORT*  Clinical  Data:  Swollen painful left eye with decreased vision. Left eye blindness.  MRI HEAD AND ORBITS WITHOUT AND WITH CONTRAST  Technique:  Multiplanar, multiecho pulse sequences of the brain and surrounding structures were obtained without and with intravenous contrast. Multiplanar, multiecho pulse sequences of the orbits and surrounding structures were obtained including fat saturation techniques,  before and after intravenous contrast administration.  Contrast: 13 ml Multihance IV  Comparison:  CT 01/01/2012  MRI HEAD  Findings:  Negative for acute infarct.  No significant chronic ischemia.  Negative for intracranial mass or hemorrhage. Ventricles are normal in size.  Paranasal sinuses are clear.  Extensive inflammation is present in the left orbit with proptosis. There is edema of the sclera extending into the orbital fat.  See separate report of the orbit.  The diffusion weighted imaging is positive throughout the sclera suggesting acute infection.  Postcontrast imaging of the brain reveals normal enhancement.  No evidence of brain abscess or meningeal thickening.  IMPRESSION: No significant intracranial infection.   Left orbital edema.  MRI ORBITS  Findings: Extensive orbital edema is present on the left.  There is proptosis which has progressed from the prior study yesterday. There is a thickened shaggy sclera on the left globe which enhances diffusely.  There is considerable enhancement surrounding the distal optic nerve and surrounding   intraconal orbital fat.  This has progressed considerably from the CT.  There is also edema and enhancement in the subcutaneous tissues of the eye lid and surrounding soft tissues.  Axial postcontrast images raise the possibility of a small abscess in the superior medial quadrant of the left orbit.  However, I believe this is some fat with surrounding inflammation based on the T2 and coronal T1  images.  I also considered  the possibility septic thrombophlebitis of the superior orbital vein however I do not think this is present at this time.  The cavernous sinus appears symmetric and normal bilaterally.  No significant sinusitis is present.  The right orbit is normal.  IMPRESSION: Progression of left orbital edema since yesterday.  There is thickening with shaggy enhancement of the sclera of the eye on the left.  There is enhancement in the intraconal orbital fat  especially around the optic nerve.  No definite abscess is identified at this time.  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  I discussed the findings by telephone with Dr. Daiva Eves on 01/02/2012 at 1500 hours.  I have been unable to reach Dr. Randon Goldsmith who is not on call.  Original Report Authenticated By: Camelia Phenes, M.D.    Medications: Scheduled Meds:   . bacitracin-polymyxin b   Left Eye QID  . cefTAZidime (FORTAZ)  IV  1 g Intravenous Q8H  . cefTAZidime  100 mg Subconjunctival Once  . cefTAZidime  2.25 mg Intravitreal Once  . feeding supplement  237 mL Oral BID  . fluconazole (DIFLUCAN) IV  400 mg Intravenous Q24H  . fluconazole (DIFLUCAN) IV  800 mg Intravenous Once  . gadobenate dimeglumine  13 mL Intravenous Once  . gatifloxacin  1 drop Left Eye Q2H while awake  . pantoprazole  40 mg Oral BID AC  . potassium chloride  40 mEq Oral Once  . sodium chloride  3 mL Intravenous Q12H  . vancomycin  750 mg Intravenous Q12H  . vancomycin  0.1 mg Intravitreal Once  . vancomycin  25 mg Subconjunctival Once  . DISCONTD: methylPREDNISolone (SOLU-MEDROL) injection  80 mg Intravenous Q12H  . DISCONTD: moxifloxacin  400  mg Intravenous Q24H   Continuous Infusions:   . sodium chloride 75 mL/hr at 01/04/12 1436   PRN Meds:.acetaminophen, acetaminophen, fentaNYL, HYDROcodone-acetaminophen, ondansetron (ZOFRAN) IV, ondansetron Assessment:  43 female with chr low back pain and hx of hep B presents with 2 days hx of painless left eye blindness with hx of nausea. Vomiting an diarrhea 1 week back with findings of left anterior uveitis and multilobar left sided pneumonia on CT scan.  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  Patient had MRI of her cervical thoracic and LS spine done on 3/5 showing epidural abscess over L4-S2 with left psoas abscess.  PLAN:  Left anterior uveitis complicated with panopthalmitis  Seen by opthalmology consult Dr Joneen Roach . Initially  thought to be possible  autoimmune , however her uveitis appears much worse since 3/3 and 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 abx coverage broadened with IV vanco and cefepime ( 3/3) . Also added fluconazole for fungal coverage on 3/4. -Dr Everardo Pacific performed vitrectomy on 3/4  with cx sent and will follow. As per opthalmology She does not Have retinal vision so recommends to hold off on surgery for now and monitor . -She now has a  left shoulder pain with minimal swelling warmth and erythema. Which appears more superficial on exam. 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 i have  consulted neurosurgery to evaluate tth epidural abscess as well and possibly drain  Cont empiric abx  Left sided multilobar PNA  Started on avelox And will continue ( day 6)  sputum cx no growth so far  Swallow eval to r/o aspiration  No temp spike in 48 hrs  Cultures so far negative  Pulmonary following  Anemia  Iron deficiency noted on iron panel  b12 and folate wnl  Stool for occult blood negative  Will monitor closely, hold off on trasnfusion For now until acute issues resolve  will need GI eval once acute issue resolved   Herpes labialis  Seen by ID consults and recommends starting valtrex which has now  been discontinued .   Diarrhea  Prior to admission  c diff negative  resolved   DVT prophylaxis:  SCD   Full code   Pending issues:  neurosurgery eval for  lumbar epidural abscess IR guided drain for  psoas abscess and culture Follow vitreous and abscess culture Follow TEE result  opthalmology closely following IR to drain psoas abscess in am. NPO after midnight PT eval  Consult:  opthalmology :[place] and Dr Everardo Pacific  ID : [facility]          LOS: 5 days   Temperence Zenor 01/04/2012, 4:13 PM

## 2012-01-04 NOTE — Progress Notes (Signed)
Patient ID: Donna Tapia, female   DOB: 10-27-1951, 61 y.o.   MRN: [id number]  S:  Reports that the left eye seems unchanged.  Still with significant back pain.   Reports that over past two weeks prior to admission had new onset incontinence, lower extremity weakness, and "sciatic" type pain.     Past Medical History  Diagnosis Date  . Hepatitis B    Past Surgical History  Procedure Date  . Abdominal hysterectomy    Family History  Problem Relation Age of Onset  . Coronary artery disease Mother   . Parkinsonism Mother    Current Facility-Administered Medications  Medication Dose Route Frequency Provider Last Rate Last Dose  . 0.9 %  sodium chloride infusion   Intravenous Continuous [name], MD 75 mL/hr at 01/04/12 1436    . acetaminophen (TYLENOL) tablet 650 mg  650 mg Oral Q6H PRN [name], MD   650 mg at 12/31/11 2234   Or  . acetaminophen (TYLENOL) suppository 650 mg  650 mg Rectal Q6H PRN [name], MD      . bacitracin-polymyxin b (POLYSPORIN) ophthalmic ointment   Left Eye QID Antony Contras, MD      . cefTAZidime (FORTAZ) 1 g in dextrose 5 % 50 mL IVPB  1 g Intravenous Q8H [name], MD   1 g at 01/04/12 1436  . cefTAZidime (FORTAZ) ophth injection 100 mg  100 mg Subconjunctival Once [name], MD      . cefTAZidime Elita Quick) ophth injection 2.25 mg  2.25 mg Intravitreal Once [name], MD      . feeding supplement (ENSURE CLINICAL STRENGTH) liquid 237 mL  237 mL Oral BID [name], RD   237 mL at 01/04/12 1435  . fentaNYL (SUBLIMAZE) injection 25 mcg  25 mcg Intravenous Q3H PRN [name], MD   25 mcg at 01/04/12 1436  . fluconazole (DIFLUCAN) IVPB 400 mg  400 mg Intravenous Q24H [name], MD      . fluconazole (DIFLUCAN) IVPB 800 mg  800 mg Intravenous Once [name], MD   800 mg at 01/03/12 2039  . gadobenate dimeglumine (MULTIHANCE) injection 13 mL  13 mL Intravenous Once Medication Radiologist, MD    13 mL at 01/04/12 1345  . gatifloxacin (ZYMAXID) 0.5 % ophthalmic drops 1 drop  1 drop Left Eye Q2H while awake Antony Contras, MD   1 drop at 01/04/12 1626  . HYDROcodone-acetaminophen (NORCO) 10-325 MG per tablet 1 tablet  1 tablet Oral Q4H PRN [name], MD   1 tablet at 01/02/12 2034  . ondansetron (ZOFRAN) tablet 4 mg  4 mg Oral Q6H PRN [name], MD   4 mg at 01/02/12 2034   Or  . ondansetron [facility]) injection 4 mg  4 mg Intravenous Q6H PRN [name], MD   4 mg at 01/03/12 0845  . pantoprazole (PROTONIX) EC tablet 40 mg  40 mg Oral BID AC Sandrea Hughs, MD   40 mg at 01/04/12 0831  . potassium chloride SA (K-DUR,KLOR-CON) CR tablet 40 mEq  40 mEq Oral Once [name], MD   40 mEq at 01/03/12 1851  . sodium chloride 0.9 % injection 3 mL  3 mL Intravenous Q12H [name], MD   3 mL at 01/04/12 1000  . vancomycin (VANCOCIN) 750 mg in sodium chloride 0.9 % 150 mL IVPB  750 mg Intravenous Q12H [name], MD   750 mg at 01/04/12 1626  .  vancomycin (VANCOCIN) ophth injection 0.1 mg  0.1 mg Intravitreal Once [name], MD      . vancomycin [facility]) ophth injection 25 mg  25 mg Subconjunctival Once [name], MD      . DISCONTD: methylPREDNISolone sodium succinate (SOLU-MEDROL) 125 MG injection 80 mg  80 mg Intravenous Q12H Sandrea Hughs, MD   80 mg at 01/03/12 1217  . DISCONTD: moxifloxacin (AVELOX) IVPB 400 mg  400 mg Intravenous Q24H [name], MD   400 mg at 01/03/12 0045    Review of systems: @ROS @  Physical Exam:  Blood pressure 139/71, pulse 86, temperature 98.4 F (36.9 C), temperature source Oral, resp. rate 19, height 5\' 9"  (1.753 m), weight 62.2 kg (137 lb 2 oz), SpO2 94.00%.   [facility] NLP     Lighted bedside exam:                                 OD                                       External/adnexa: Normal                                      Lids/lashes:        Normal                                        Conjunctiva        White, quiet        Cornea:              Clear                                                      OS                                       External/adnexa: 2+ periocular erythema, 1-2+ edema                                 Lids/lashes:        Complete ptosis; 2+ edema and 2+ erythema (slightly improved c/t exam 24 hrs prior)                                   Conjunctiva        Bullous chemosis with yellowish hue; 2+ hyperemia.         Cornea:              Diffusely edematous with loss of clarity                  AC:                     Shallow;  fibrinous material throughout; diffuse areas of PAS; PS                               Iris:                      diffuse areas of PAS; PS        Lens:                  No view.       Labs/studies: Results for orders placed during the hospital encounter of 12/30/11 (from the past 48 hour(s))  CBC     Status: Abnormal   Collection Time   01/03/12  6:24 AM      Component Value Range Comment   WBC 14.7 (*) 4.0 - 10.5 (K/uL)    RBC 2.43 (*) 3.87 - 5.11 (MIL/uL)    Hemoglobin 7.6 (*) 12.0 - 15.0 (g/dL)    HCT 16.1 (*) 09.6 - 46.0 (%)    MCV 93.4  78.0 - 100.0 (fL)    MCH 31.3  26.0 - 34.0 (pg)    MCHC 33.5  30.0 - 36.0 (g/dL)    RDW 04.5  40.9 - 81.1 (%)    Platelets 492 (*) 150 - 400 (K/uL)   BASIC METABOLIC PANEL     Status: Abnormal   Collection Time   01/03/12  6:24 AM      Component Value Range Comment   Sodium 133 (*) 135 - 145 (mEq/L)    Potassium 3.3 (*) 3.5 - 5.1 (mEq/L)    Chloride 101  96 - 112 (mEq/L)    CO2 23  19 - 32 (mEq/L)    Glucose, Bld 88  70 - 99 (mg/dL)    BUN 8  6 - 23 (mg/dL)    Creatinine, Ser 9.14  0.50 - 1.10 (mg/dL)    Calcium 7.5 (*) 8.4 - 10.5 (mg/dL)    GFR calc non Af Amer >90  >90 (mL/min)    GFR calc Af Amer >90  >90 (mL/min)   EYE CULTURE     Status: Normal (Preliminary result)   Collection Time   01/03/12  1:45 PM      Component Value Range Comment   Specimen Description EYE LEFT       Special Requests        Value: VTREOUS PT ON VANC CEFTAZ DO SENS TO ALL GROWTH INCLUDING COAG NEG STAPH PER MD   Culture NO GROWTH      Report Status PENDING     EYE CULTURE     Status: Normal (Preliminary result)   Collection Time   01/03/12  1:45 PM      Component Value Range Comment   Specimen Description EYE LEFT      Special Requests        Value: ANTEROR CHAMBER PT ON VANC CEFTAZ DO SENS TO ALL GROWTH INCLUDING COAG NEG STAPH PER MD   Culture NO GROWTH      Report Status PENDING     CULTURE, SPUTUM-ASSESSMENT     Status: Normal   Collection Time   01/04/12  6:48 AM      Component Value Range Comment   Specimen Description SPUTUM      Special Requests NONE      Sputum evaluation        Value: MICROSCOPIC FINDINGS SUGGEST THAT THIS SPECIMEN IS NOT REPRESENTATIVE OF LOWER  RESPIRATORY SECRETIONS. PLEASE RECOLLECT.     CALLED TO [facility] RN 01/04/12 0800 COSTELLO B   Report Status 01/04/2012 FINAL     CBC     Status: Abnormal   Collection Time   01/04/12  6:53 AM      Component Value Range Comment   WBC 13.1 (*) 4.0 - 10.5 (K/uL)    RBC 2.38 (*) 3.87 - 5.11 (MIL/uL)    Hemoglobin 7.4 (*) 12.0 - 15.0 (g/dL)    HCT 16.1 (*) 09.6 - 46.0 (%)    MCV 93.3  78.0 - 100.0 (fL)    MCH 31.1  26.0 - 34.0 (pg)    MCHC 33.3  30.0 - 36.0 (g/dL)    RDW 04.5  40.9 - 81.1 (%)    Platelets 576 (*) 150 - 400 (K/uL)   BASIC METABOLIC PANEL     Status: Abnormal   Collection Time   01/04/12  6:53 AM      Component Value Range Comment   Sodium 133 (*) 135 - 145 (mEq/L)    Potassium 3.8  3.5 - 5.1 (mEq/L)    Chloride 104  96 - 112 (mEq/L)    CO2 23  19 - 32 (mEq/L)    Glucose, Bld 103 (*) 70 - 99 (mg/dL)    BUN 11  6 - 23 (mg/dL)    Creatinine, Ser 9.14  0.50 - 1.10 (mg/dL)    Calcium 7.7 (*) 8.4 - 10.5 (mg/dL)    GFR calc non Af Amer >90  >90 (mL/min)    GFR calc Af Amer >90  >90 (mL/min)                               Assessment and Plan:  -- 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  Agree with continuation of broad spectrum IV abx, with goal to prevent/mitigate posterior or more diffuse spread of infection further into orbit. The prognosis for the salvage of this eye is quite poor given severity of infection.  Dr. Clarisa Kindred continues to follow and manage closely.    Rec:  - Cont IV abx - Dr. Clarisa Kindred may re-inject in near future.  - Follow clinical course and micro data closely.  - If shows any signs of worsening orbital or CNS process would have low threshold to re-image.   -- Epidural abscess, osteo.  Agree with primary team consulting Neuro for further eval/management of the epidural abscess.  Would still pursue TEE to eval for valve involvement.    All of the above information was relayed to the patient. All questions were answered.   Artice Holohan 01/04/2012, 5:33 PM Signed at 636pm  [facility] Ophthalmology [phone]

## 2012-01-04 NOTE — Preoperative (Signed)
Beta Blockers   Reason not to administer Beta Blockers:Not Applicable 

## 2012-01-05 ENCOUNTER — Inpatient Hospital Stay (HOSPITAL_COMMUNITY): Payer: Medicaid Other

## 2012-01-05 ENCOUNTER — Other Ambulatory Visit (HOSPITAL_COMMUNITY): Payer: 59

## 2012-01-05 LAB — CBC
HCT: 32.6 % — ABNORMAL LOW (ref 36.0–46.0)
MCHC: 33.4 g/dL (ref 30.0–36.0)
MCV: 92.4 fL (ref 78.0–100.0)
RDW: 16.2 % — ABNORMAL HIGH (ref 11.5–15.5)

## 2012-01-05 LAB — TYPE AND SCREEN
Unit division: 0
Unit division: 0

## 2012-01-05 LAB — BASIC METABOLIC PANEL
BUN: 9 mg/dL (ref 6–23)
Calcium: 8 mg/dL — ABNORMAL LOW (ref 8.4–10.5)
Creatinine, Ser: 0.62 mg/dL (ref 0.50–1.10)
GFR calc Af Amer: >90 mL/min (ref >90)
GFR calc non Af Amer: >90 mL/min (ref >90)

## 2012-01-05 LAB — BARTONELLA ANTIBODY PANEL: B henselae IgM: NEGATIVE

## 2012-01-05 MED ORDER — HYDROMORPHONE HCL PF 1 MG/ML IJ SOLN
0.5000 mg | INTRAMUSCULAR | Status: DC | PRN
  Administered 2012-01-05 – 2012-01-10 (×18): 0.5 mg via INTRAVENOUS
  Administered 2012-01-10: 12:00:00 via INTRAVENOUS
  Administered 2012-01-11 – 2012-01-17 (×16): 0.5 mg via INTRAVENOUS
  Filled 2012-01-05 (×36): qty 1

## 2012-01-05 MED ORDER — IOHEXOL 300 MG/ML  SOLN
20.0000 mL | INTRAMUSCULAR | Status: AC
  Administered 2012-01-05: 20 mL via ORAL

## 2012-01-05 MED ORDER — SODIUM CHLORIDE 0.9 % IV SOLN
750.0000 mg | Freq: Three times a day (TID) | INTRAVENOUS | Status: DC
  Administered 2012-01-06 – 2012-01-17 (×34): 750 mg via INTRAVENOUS
  Filled 2012-01-05 (×36): qty 750

## 2012-01-05 MED ORDER — CEFTAZIDIME SUBCONJUCTIVAL INJECTION 100 MG/0.5 ML
100.0000 mg | Freq: Once | SUBCONJUNCTIVAL | Status: AC
  Administered 2012-01-05: 100 mg via SUBCONJUNCTIVAL
  Filled 2012-01-05: qty 0.5

## 2012-01-05 MED ORDER — CEFTAZIDIME INTRAVITREAL INJECTION 2.25 MG/0.1 ML
2.2500 mg | Freq: Once | INTRAVITREAL | Status: AC
  Administered 2012-01-05: 2.25 mg via INTRAVITREAL
  Filled 2012-01-05: qty 0.1

## 2012-01-05 MED ORDER — VANCOMYCIN SUBCONJUNCTIVAL INJECTION 25 MG/0.5 ML
25.0000 mg | Freq: Once | INTRAOCULAR | Status: AC
  Administered 2012-01-05: 25 mg via SUBCONJUNCTIVAL
  Filled 2012-01-05: qty 0.5

## 2012-01-05 MED ORDER — DIAZEPAM 5 MG/ML IJ SOLN
10.0000 mg | Freq: Once | INTRAMUSCULAR | Status: AC
  Administered 2012-01-05: 10 mg via INTRAVENOUS
  Filled 2012-01-05: qty 2

## 2012-01-05 MED ORDER — IOHEXOL 300 MG/ML  SOLN
100.0000 mL | Freq: Once | INTRAMUSCULAR | Status: AC | PRN
  Administered 2012-01-05: 100 mL via INTRAVENOUS

## 2012-01-05 MED ORDER — IOHEXOL 300 MG/ML  SOLN: 40.0000 mL | Freq: Once | INTRAMUSCULAR | Status: DC | PRN

## 2012-01-05 MED ORDER — VANCOMYCIN 10 MG/ML OPHTHALMIC INJECTION
1.0000 mg | INJECTION | Freq: Once | Status: AC
  Administered 2012-01-05: 1 mg via SUBCONJUNCTIVAL
  Filled 2012-01-05: qty 0.1

## 2012-01-05 MED ORDER — FENTANYL CITRATE 0.05 MG/ML IJ SOLN
12.5000 ug | Freq: Once | INTRAMUSCULAR | Status: AC
  Administered 2012-01-05: 12.5 ug via INTRAVENOUS
  Filled 2012-01-05: qty 2

## 2012-01-05 NOTE — Progress Notes (Signed)
Dr. Gerlene Fee confirmed pt was to return to [phone].

## 2012-01-05 NOTE — Progress Notes (Signed)
Patient ID: Donna Tapia, female   DOB: [birth year], 61 y.o.   MRN: [id number] Subjective: Patient reports Diffuse pain  Objective: Vital signs in last 24 hours: Temp:  [98 F (36.7 C)-99.3 F (37.4 C)] 98.4 F (36.9 C) (03/06 0550) Pulse Rate:  [65-88] 85  (03/06 0550) Resp:  [18-26] 18  (03/06 0550) BP: (109-147)/(53-71) 147/69 mmHg (03/06 0550) SpO2:  [93 %-99 %] 95 % (03/06 0550)  Intake/Output from previous day: 03/05 0701 - 03/06 0700 In: 2300 [I.V.:1600; Blood:700] Out: 1680 [Urine:1600; Drains:80] Intake/Output this shift:    Awake, alert, conversant. Moves all 4 extremties with good strength  Lab Results:  Basename 01/05/12 0447 01/04/12 0653  WBC 18.5* 13.1*  HGB 10.9* 7.4*  HCT 32.6* 22.2*  PLT 665* 576*   BMET  Basename 01/05/12 0447 01/04/12 0653  NA 135 133*  K 4.4 3.8  CL 105 104  CO2 24 23  GLUCOSE 74 103*  BUN 9 11  CREATININE 0.62 0.57  CALCIUM 8.0* 7.7*    Studies/Results: Dg Chest 2 View  01/04/2012  *RADIOLOGY REPORT*  Clinical Data: Pneumonia.  CHEST - 2 VIEW  Comparison: 12/31/2011  Findings: Continued areas of consolidation throughout the left lung, most confluent in the left upper lobe.  Patchy right upper lobe opacities are again noted.  Biapical scarring.  Heart is normal size.  Bilateral pleural effusions, new since prior study. Increased since prior study.  IMPRESSION: Continued multi focal airspace opacities, likely pneumonia. Enlarging bilateral effusions.  Original Report Authenticated By: Cyndie Chime, M.D.   Mr Cervical Spine W Wo Contrast  01/04/2012  *RADIOLOGY REPORT*  Clinical Data:  Severe pain.  Question epidural abscess.  MRI CERVICAL, THORACIC AND LUMBAR SPINE WITHOUT AND WITH CONTRAST  Technique:  Multiplanar and multiecho pulse sequences of the cervical spine, to include the craniocervical junction and cervicothoracic junction, and thoracic and lumbar spine, were obtained without and with intravenous contrast.  Contrast: 13mL  MULTIHANCE GADOBENATE DIMEGLUMINE 529 MG/ML IV SOLN  Comparison:  CT abdomen and pelvis 12/30/2011 and CT chest 12/31/2011.  MRI CERVICAL SPINE  Findings:  Patient motion somewhat degrades study.  Vertebral body height, signal and alignment are maintained.  There is no epidural abscess.  No evidence of diskitis/osteomyelitis.  Cervical cord signal is normal and the craniocervical junction is normal. Paraspinous structures are unremarkable.  C2-3:  Negative.  C3-4:  Mild disc bulging uncovertebral disease.  Central canal is open.  Foramina are mildly narrowed.  C4-5:  Facet arthropathy and a mild disc bulge.  Central canal and foramina are open.  C5-6:  Disc bulge effaces the ventral thecal sac and causes some deformity of the cord.  Moderate bilateral foraminal narrowing is present.  C6-7:  Disc bulge contacts the cord without deforming it.  There is mild to moderate left foraminal narrowing the right foramen open.  C7-T1:  Negative.  IMPRESSION:  1.  Negative for epidural abscess or other infectious process. 2.  Degenerative disease most notable at C5-6 where a disc bulge causes some deformity of the ventral cord and there is moderate bilateral foraminal narrowing.  MRI THORACIC SPINE  Findings: Vertebral body height, signal and alignment are maintained.  There is no epidural abscess and no evidence of diskitis/osteomyelitis.  The thoracic cord appears normal. Paraspinous structures demonstrate extensive airspace disease on the left.  Patchy airspace disease in the right upper lobe is also noted.  There are left worse than right pleural effusions.  No disc bulge or protrusion  is identified.  Central canal and foramina are open at all levels.  IMPRESSION:  1.  Negative for epidural abscess or diskitis/osteomyelitis. 2.  Left much worse than right airspace disease consistent with pneumonia.  Associated left greater than right pleural effusions also identified.  MRI LUMBAR SPINE  Findings: There is fluid in the disc  interspace at L3-4 with edema and enhancement throughout the L3 and L4 vertebral bodies.  Also seen is marked marrow edema about the right L4-5 facet with post contrast enhancement.  A large epidural abscess is identified posterior to the thecal sac extending from the inferior aspect of L4 to the S1-2 level.  The abscess measures approximately 5.6 cm cranial-caudal by up to 1.6 cm transverse by 1.1 cm AP.  There is mass effect on the thecal sac, worst at L5-S1 where the sac is markedly compressed.  Given its posterior position, the abscess likely originates from the right L4-5 facet.  There is also a left psoas abscess which is incompletely visualized and measures approximately 2.2 cm transverse by 2.7 cm AP by 6 cm cranial- caudal.  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  There is no fracture or subluxation of the lumbar spine.  L1-2:  Disc bulge eccentric to the right without central canal stenosis.  There is some narrowing of the right lateral recess and foramen.  L2-3:  Disc bulge and ligamentum flavum thickening. There appears to be a small extruded disc fragment in the left lateral recess which could encroach on the descending left L3 root.  Finding could be due to a tiny epidural abscess although this is felt less likely.  L3-4:  No disc bulge or protrusion.  L4-5:  Disc bulge is identified.  No central canal stenosis due to disc.  Foramina are mildly narrowed.  L5-S1:  Negative for disc bulge or protrusion.  IMPRESSION:  1.  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  Large and posterior epidural abscess compresses the thecal sac from the L4 level to S1-2 as described above.  Also seen is a large left psoas abscess. Critical Value/emergent results were called by telephone at the time of interpretation on 01/04/2012  at 2:10 p.m.  to  Dr. Luciana Axe, who verbally acknowledged these results. 2.  Likely extruded disc fragment in the left lateral  recess at L2- 3 could impact the descending left L3 root.  Original Report Authenticated By: Bernadene Bell. Maricela Curet, M.D.   Mr Thoracic Spine W Wo Contrast  01/04/2012  *RADIOLOGY REPORT*  Clinical Data:  Severe pain.  Question epidural abscess.  MRI CERVICAL, THORACIC AND LUMBAR SPINE WITHOUT AND WITH CONTRAST  Technique:  Multiplanar and multiecho pulse sequences of the cervical spine, to include the craniocervical junction and cervicothoracic junction, and thoracic and lumbar spine, were obtained without and with intravenous contrast.  Contrast: 13mL MULTIHANCE GADOBENATE DIMEGLUMINE 529 MG/ML IV SOLN  Comparison:  CT abdomen and pelvis 12/30/2011 and CT chest 12/31/2011.  MRI CERVICAL SPINE  Findings:  Patient motion somewhat degrades study.  Vertebral body height, signal and alignment are maintained.  There is no epidural abscess.  No evidence of diskitis/osteomyelitis.  Cervical cord signal is normal and the craniocervical junction is normal. Paraspinous structures are unremarkable.  C2-3:  Negative.  C3-4:  Mild disc bulging uncovertebral disease.  Central canal is open.  Foramina are mildly narrowed.  C4-5:  Facet arthropathy and a mild disc bulge.  Central canal and  foramina are open.  C5-6:  Disc bulge effaces the ventral thecal sac and causes some deformity of the cord.  Moderate bilateral foraminal narrowing is present.  C6-7:  Disc bulge contacts the cord without deforming it.  There is mild to moderate left foraminal narrowing the right foramen open.  C7-T1:  Negative.  IMPRESSION:  1.  Negative for epidural abscess or other infectious process. 2.  Degenerative disease most notable at C5-6 where a disc bulge causes some deformity of the ventral cord and there is moderate bilateral foraminal narrowing.  MRI THORACIC SPINE  Findings: Vertebral body height, signal and alignment are maintained.  There is no epidural abscess and no evidence of diskitis/osteomyelitis.  The thoracic cord appears normal.  Paraspinous structures demonstrate extensive airspace disease on the left.  Patchy airspace disease in the right upper lobe is also noted.  There are left worse than right pleural effusions.  No disc bulge or protrusion is identified.  Central canal and foramina are open at all levels.  IMPRESSION:  1.  Negative for epidural abscess or diskitis/osteomyelitis. 2.  Left much worse than right airspace disease consistent with pneumonia.  Associated left greater than right pleural effusions also identified.  MRI LUMBAR SPINE  Findings: There is fluid in the disc interspace at L3-4 with edema and enhancement throughout the L3 and L4 vertebral bodies.  Also seen is marked marrow edema about the right L4-5 facet with post contrast enhancement.  A large epidural abscess is identified posterior to the thecal sac extending from the inferior aspect of L4 to the S1-2 level.  The abscess measures approximately 5.6 cm cranial-caudal by up to 1.6 cm transverse by 1.1 cm AP.  There is mass effect on the thecal sac, worst at L5-S1 where the sac is markedly compressed.  Given its posterior position, the abscess likely originates from the right L4-5 facet.  There is also a left psoas abscess which is incompletely visualized and measures approximately 2.2 cm transverse by 2.7 cm AP by 6 cm cranial- caudal.  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  There is no fracture or subluxation of the lumbar spine.  L1-2:  Disc bulge eccentric to the right without central canal stenosis.  There is some narrowing of the right lateral recess and foramen.  L2-3:  Disc bulge and ligamentum flavum thickening. There appears to be a small extruded disc fragment in the left lateral recess which could encroach on the descending left L3 root.  Finding could be due to a tiny epidural abscess although this is felt less likely.  L3-4:  No disc bulge or protrusion.  L4-5:  Disc bulge is identified.  No central canal stenosis due to disc.  Foramina  are mildly narrowed.  L5-S1:  Negative for disc bulge or protrusion.  IMPRESSION:  1.  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  Large and posterior epidural abscess compresses the thecal sac from the L4 level to S1-2 as described above.  Also seen is a large left psoas abscess. Critical Value/emergent results were called by telephone at the time of interpretation on 01/04/2012  at 2:10 p.m.  to  Dr. Luciana Axe, who verbally acknowledged these results. 2.  Likely extruded disc fragment in the left lateral recess at L2- 3 could impact the descending left L3 root.  Original Report Authenticated By: Bernadene Bell. Maricela Curet, M.D.   Mr Lumbar Spine W Wo Contrast  01/04/2012  *  RADIOLOGY REPORT*  Clinical Data:  Severe pain.  Question epidural abscess.  MRI CERVICAL, THORACIC AND LUMBAR SPINE WITHOUT AND WITH CONTRAST  Technique:  Multiplanar and multiecho pulse sequences of the cervical spine, to include the craniocervical junction and cervicothoracic junction, and thoracic and lumbar spine, were obtained without and with intravenous contrast.  Contrast: 13mL MULTIHANCE GADOBENATE DIMEGLUMINE 529 MG/ML IV SOLN  Comparison:  CT abdomen and pelvis 12/30/2011 and CT chest 12/31/2011.  MRI CERVICAL SPINE  Findings:  Patient motion somewhat degrades study.  Vertebral body height, signal and alignment are maintained.  There is no epidural abscess.  No evidence of diskitis/osteomyelitis.  Cervical cord signal is normal and the craniocervical junction is normal. Paraspinous structures are unremarkable.  C2-3:  Negative.  C3-4:  Mild disc bulging uncovertebral disease.  Central canal is open.  Foramina are mildly narrowed.  C4-5:  Facet arthropathy and a mild disc bulge.  Central canal and foramina are open.  C5-6:  Disc bulge effaces the ventral thecal sac and causes some deformity of the cord.  Moderate bilateral foraminal narrowing is present.  C6-7:  Disc bulge contacts the  cord without deforming it.  There is mild to moderate left foraminal narrowing the right foramen open.  C7-T1:  Negative.  IMPRESSION:  1.  Negative for epidural abscess or other infectious process. 2.  Degenerative disease most notable at C5-6 where a disc bulge causes some deformity of the ventral cord and there is moderate bilateral foraminal narrowing.  MRI THORACIC SPINE  Findings: Vertebral body height, signal and alignment are maintained.  There is no epidural abscess and no evidence of diskitis/osteomyelitis.  The thoracic cord appears normal. Paraspinous structures demonstrate extensive airspace disease on the left.  Patchy airspace disease in the right upper lobe is also noted.  There are left worse than right pleural effusions.  No disc bulge or protrusion is identified.  Central canal and foramina are open at all levels.  IMPRESSION:  1.  Negative for epidural abscess or diskitis/osteomyelitis. 2.  Left much worse than right airspace disease consistent with pneumonia.  Associated left greater than right pleural effusions also identified.  MRI LUMBAR SPINE  Findings: There is fluid in the disc interspace at L3-4 with edema and enhancement throughout the L3 and L4 vertebral bodies.  Also seen is marked marrow edema about the right L4-5 facet with post contrast enhancement.  A large epidural abscess is identified posterior to the thecal sac extending from the inferior aspect of L4 to the S1-2 level.  The abscess measures approximately 5.6 cm cranial-caudal by up to 1.6 cm transverse by 1.1 cm AP.  There is mass effect on the thecal sac, worst at L5-S1 where the sac is markedly compressed.  Given its posterior position, the abscess likely originates from the right L4-5 facet.  There is also a left psoas abscess which is incompletely visualized and measures approximately 2.2 cm transverse by 2.7 cm AP by 6 cm cranial- caudal.  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  There is no fracture or  subluxation of the lumbar spine.  L1-2:  Disc bulge eccentric to the right without central canal stenosis.  There is some narrowing of the right lateral recess and foramen.  L2-3:  Disc bulge and ligamentum flavum thickening. There appears to be a small extruded disc fragment in the left lateral recess which could encroach on the descending left L3 root.  Finding could be due to a tiny epidural abscess  although this is felt less likely.  L3-4:  No disc bulge or protrusion.  L4-5:  Disc bulge is identified.  No central canal stenosis due to disc.  Foramina are mildly narrowed.  L5-S1:  Negative for disc bulge or protrusion.  IMPRESSION:  1.  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  Large and posterior epidural abscess compresses the thecal sac from the L4 level to S1-2 as described above.  Also seen is a large left psoas abscess. Critical Value/emergent results were called by telephone at the time of interpretation on 01/04/2012  at 2:10 p.m.  to  Dr. Luciana Axe, who verbally acknowledged these results. 2.  Likely extruded disc fragment in the left lateral recess at L2- 3 could impact the descending left L3 root.  Original Report Authenticated By: Bernadene Bell. D'ALESSIO, M.D.   Dg Lumbar Spine 1 View  01/04/2012  *RADIOLOGY REPORT*  Clinical Data: Epidural abscess.  LUMBAR SPINE - 1 VIEW  Comparison: MRI dated 01/04/2012  Findings: Single lateral view demonstrates instruments at the L5-S1 level.  IMPRESSION: Instruments at L5-S1.  Original Report Authenticated By: Gwynn Burly, M.D.    Assessment/Plan: Doing fair. Will get MRI to asses psoas abscess and need for IR drainage.  LOS: 6 days     Reinaldo Meeker, MD 01/05/2012, 8:23 AM

## 2012-01-05 NOTE — Transfer of Care (Signed)
Immediate Anesthesia Transfer of Care Note  Patient: Donna Tapia  Procedure(s) Performed: Procedure(s) (LRB): LUMBAR LAMINECTOMY/DECOMPRESSION MICRODISCECTOMY 1 LEVEL (N/A)  Patient Location: PACU  Anesthesia Type: General  Level of Consciousness: awake, alert , oriented and patient cooperative  Airway & Oxygen Therapy: Patient Spontanous Breathing and Patient connected to nasal cannula oxygen  Post-op Assessment: Report given to PACU RN, Post -op Vital signs reviewed and stable and Patient moving all extremities X 4  Post vital signs: Reviewed and stable  Complications: No apparent anesthesia complications

## 2012-01-05 NOTE — Progress Notes (Addendum)
Subjective: Patient c/o hurting all over   Objective: Vital signs in last 24 hours: Filed Vitals:   01/04/12 2330 01/04/12 2345 01/05/12 0015 01/05/12 0550  BP: 138/63 126/53 122/67 147/69  Pulse: 84 71 73 85  Temp:  98 F (36.7 C) 99.3 F (37.4 C) 98.4 F (36.9 C)  TempSrc:      Resp: 18 18 18 18   Height:      Weight:      SpO2: 95% 97% 93% 95%   Weight change:   Intake/Output Summary (Last 24 hours) at 01/05/12 1013 Last data filed at 01/05/12 0655  Gross per 24 hour  Intake   2300 ml  Output   1680 ml  Net    620 ml    Physical Exam: General: Awake, Oriented, appears in pain HEENT: L eye orbital swelling Neck: Supple CV: S1 and S2, rrr Lungs: crackles L side Abdomen: Soft, Nontender, Nondistended, +bowel sounds. Ext: Good pulses. No edema., weakness B/L Back: drain in  Lab Results:  Basename 01/05/12 0447 01/04/12 0653  NA 135 133*  K 4.4 3.8  CL 105 104  CO2 24 23  GLUCOSE 74 103*  BUN 9 11  CREATININE 0.62 0.57  CALCIUM 8.0* 7.7*  MG -- --  PHOS -- --   No results found for this basename: AST:2,ALT:2,ALKPHOS:2,BILITOT:2,PROT:2,ALBUMIN:2 in the last 72 hours No results found for this basename: LIPASE:2,AMYLASE:2 in the last 72 hours  Basename 01/05/12 0447 01/04/12 0653  WBC 18.5* 13.1*  NEUTROABS -- --  HGB 10.9* 7.4*  HCT 32.6* 22.2*  MCV 92.4 93.3  PLT 665* 576*   No results found for this basename: CKTOTAL:3,CKMB:3,CKMBINDEX:3,TROPONINI:3 in the last 72 hours No components found with this basename: POCBNP:3 No results found for this basename: DDIMER:2 in the last 72 hours No results found for this basename: HGBA1C:2 in the last 72 hours No results found for this basename: CHOL:2,HDL:2,LDLCALC:2,TRIG:2,CHOLHDL:2,LDLDIRECT:2 in the last 72 hours No results found for this basename: TSH,T4TOTAL,FREET3,T3FREE,THYROIDAB in the last 72 hours No results found for this basename: VITAMINB12:2,FOLATE:2,FERRITIN:2,TIBC:2,IRON:2,RETICCTPCT:2 in the  last 72 hours  Micro Results: Recent Results (from the past 240 hour(s))  URINE CULTURE     Status: Normal   Collection Time   12/31/11  4:15 AM      Component Value Range Status Comment   Specimen Description URINE, CLEAN CATCH   Final    Special Requests NONE   Final    Culture  Setup Time [id number]   Final    Colony Count NO GROWTH   Final    Culture NO GROWTH   Final    Report Status 01/01/2012 FINAL   Final   CULTURE, BLOOD (ROUTINE X 2)     Status: Normal (Preliminary result)   Collection Time   12/31/11  8:42 AM      Component Value Range Status Comment   Specimen Description BLOOD LEFT ARM   Final    Special Requests BOTTLES DRAWN AEROBIC ONLY 10CC   Final    Culture  Setup Time [id number]   Final    Culture     Final    Value:        BLOOD CULTURE RECEIVED NO GROWTH TO DATE CULTURE WILL BE HELD FOR 5 DAYS BEFORE ISSUING A FINAL NEGATIVE REPORT   Report Status PENDING   Incomplete   CULTURE, BLOOD (ROUTINE X 2)     Status: Normal (Preliminary result)   Collection Time   12/31/11  8:48 AM  Component Value Range Status Comment   Specimen Description BLOOD LEFT HAND   Final    Special Requests BOTTLES DRAWN AEROBIC ONLY 10CC   Final    Culture  Setup Time [id number]   Final    Culture     Final    Value:        BLOOD CULTURE RECEIVED NO GROWTH TO DATE CULTURE WILL BE HELD FOR 5 DAYS BEFORE ISSUING A FINAL NEGATIVE REPORT   Report Status PENDING   Incomplete   CULTURE, SPUTUM-ASSESSMENT     Status: Normal   Collection Time   12/31/11  2:15 PM      Component Value Range Status Comment   Specimen Description SPUTUM   Final    Special Requests NONE   Final    Sputum evaluation     Final    Value: THIS SPECIMEN IS ACCEPTABLE. RESPIRATORY CULTURE REPORT TO FOLLOW.   Report Status 12/31/2011 FINAL   Final   CULTURE, RESPIRATORY     Status: Normal   Collection Time   12/31/11  2:15 PM      Component Value Range Status Comment   Specimen Description SPUTUM   Final     Special Requests NONE   Final    Gram Stain     Final    Value: NO WBC SEEN     NO SQUAMOUS EPITHELIAL CELLS SEEN     FEW YEAST   Culture NORMAL OROPHARYNGEAL FLORA   Final    Report Status 01/03/2012 FINAL   Final   STOOL CULTURE     Status: Normal   Collection Time   12/31/11  2:45 PM      Component Value Range Status Comment   Specimen Description STOOL   Final    Special Requests NONE   Final    Culture     Final    Value: NO SALMONELLA, SHIGELLA, CAMPYLOBACTER, OR YERSINIA ISOLATED   Report Status 01/04/2012 FINAL   Final   CLOSTRIDIUM DIFFICILE BY PCR     Status: Normal   Collection Time   01/02/12 11:29 AM      Component Value Range Status Comment   C difficile by pcr NEGATIVE  NEGATIVE  Final   EYE CULTURE     Status: Normal (Preliminary result)   Collection Time   01/03/12  1:45 PM      Component Value Range Status Comment   Specimen Description EYE LEFT   Final    Special Requests     Final    Value: VTREOUS PT ON VANC CEFTAZ DO SENS TO ALL GROWTH INCLUDING COAG NEG STAPH PER MD   Culture NO GROWTH 1 DAY   Final    Report Status PENDING   Incomplete   EYE CULTURE     Status: Normal (Preliminary result)   Collection Time   01/03/12  1:45 PM      Component Value Range Status Comment   Specimen Description EYE LEFT   Final    Special Requests     Final    Value: ANTEROR CHAMBER PT ON VANC CEFTAZ DO SENS TO ALL GROWTH INCLUDING COAG NEG STAPH PER MD   Culture NO GROWTH 1 DAY   Final    Report Status PENDING   Incomplete   CULTURE, BLOOD (ROUTINE X 2)     Status: Normal (Preliminary result)   Collection Time   01/03/12  8:32 PM      Component Value Range Status Comment  Specimen Description BLOOD RIGHT HAND   Final    Special Requests BOTTLES DRAWN AEROBIC ONLY 10CC   Final    Culture  Setup Time [id number]   Final    Culture     Final    Value:        BLOOD CULTURE RECEIVED NO GROWTH TO DATE CULTURE WILL BE HELD FOR 5 DAYS BEFORE ISSUING A FINAL NEGATIVE REPORT   Report  Status PENDING   Incomplete   CULTURE, BLOOD (ROUTINE X 2)     Status: Normal (Preliminary result)   Collection Time   01/03/12  8:39 PM      Component Value Range Status Comment   Specimen Description BLOOD RIGHT FOREARM   Final    Special Requests BOTTLES DRAWN AEROBIC ONLY 10CC   Final    Culture  Setup Time [id number]   Final    Culture     Final    Value:        BLOOD CULTURE RECEIVED NO GROWTH TO DATE CULTURE WILL BE HELD FOR 5 DAYS BEFORE ISSUING A FINAL NEGATIVE REPORT   Report Status PENDING   Incomplete   CULTURE, SPUTUM-ASSESSMENT     Status: Normal   Collection Time   01/04/12  6:48 AM      Component Value Range Status Comment   Specimen Description SPUTUM   Final    Special Requests NONE   Final    Sputum evaluation     Final    Value: MICROSCOPIC FINDINGS SUGGEST THAT THIS SPECIMEN IS NOT REPRESENTATIVE OF LOWER RESPIRATORY SECRETIONS. PLEASE RECOLLECT.     CALLED TO [facility] RN 01/04/12 0800 COSTELLO B   Report Status 01/04/2012 FINAL   Final   MRSA PCR SCREENING     Status: Normal   Collection Time   01/04/12  4:20 PM      Component Value Range Status Comment   MRSA by PCR NEGATIVE  NEGATIVE  Final   CULTURE, ROUTINE-ABSCESS     Status: Normal (Preliminary result)   Collection Time   01/04/12  9:46 PM      Component Value Range Status Comment   Specimen Description ABSCESS BACK   Final    Special Requests PATIENT ON FOLLOWING LORTAZ, DIFLUCAN EPIDURAL   Final    Gram Stain PENDING   Incomplete    Culture NO GROWTH   Final    Report Status PENDING   Incomplete   ANAEROBIC CULTURE     Status: Normal (Preliminary result)   Collection Time   01/04/12  9:46 PM      Component Value Range Status Comment   Specimen Description ABSCESS BACK   Final    Special Requests PATIENT ON FOLLOWING LORTAZ, DIFLUCAN EPIDURAL   Final    Gram Stain     Final    Value: ABUNDANT WBC PRESENT,BOTH PMN AND MONONUCLEAR     NO ORGANISMS SEEN     Performed at [facility]   Culture PENDING    Incomplete    Report Status PENDING   Incomplete   GRAM STAIN     Status: Normal   Collection Time   01/04/12  9:46 PM      Component Value Range Status Comment   Specimen Description ABSCESS BACK   Final    Special Requests PATIENT ON FOLLOWING LORTAZ, DIFLUCAN EPIDURAL   Final    Gram Stain     Final    Value: ABUNDANT WBC PRESENT,BOTH PMN AND MONONUCLEAR  NO ORGANISMS SEEN   Report Status 01/04/2012 FINAL   Final     Studies/Results: Dg Chest 2 View  01/04/2012  *RADIOLOGY REPORT*  Clinical Data: Pneumonia.  CHEST - 2 VIEW  Comparison: 12/31/2011  Findings: Continued areas of consolidation throughout the left lung, most confluent in the left upper lobe.  Patchy right upper lobe opacities are again noted.  Biapical scarring.  Heart is normal size.  Bilateral pleural effusions, new since prior study. Increased since prior study.  IMPRESSION: Continued multi focal airspace opacities, likely pneumonia. Enlarging bilateral effusions.  Original Report Authenticated By: Cyndie Chime, M.D.   Mr Cervical Spine W Wo Contrast  01/04/2012  *RADIOLOGY REPORT*  Clinical Data:  Severe pain.  Question epidural abscess.  MRI CERVICAL, THORACIC AND LUMBAR SPINE WITHOUT AND WITH CONTRAST  Technique:  Multiplanar and multiecho pulse sequences of the cervical spine, to include the craniocervical junction and cervicothoracic junction, and thoracic and lumbar spine, were obtained without and with intravenous contrast.  Contrast: 13mL MULTIHANCE GADOBENATE DIMEGLUMINE 529 MG/ML IV SOLN  Comparison:  CT abdomen and pelvis 12/30/2011 and CT chest 12/31/2011.  MRI CERVICAL SPINE  Findings:  Patient motion somewhat degrades study.  Vertebral body height, signal and alignment are maintained.  There is no epidural abscess.  No evidence of diskitis/osteomyelitis.  Cervical cord signal is normal and the craniocervical junction is normal. Paraspinous structures are unremarkable.  C2-3:  Negative.  C3-4:  Mild disc bulging  uncovertebral disease.  Central canal is open.  Foramina are mildly narrowed.  C4-5:  Facet arthropathy and a mild disc bulge.  Central canal and foramina are open.  C5-6:  Disc bulge effaces the ventral thecal sac and causes some deformity of the cord.  Moderate bilateral foraminal narrowing is present.  C6-7:  Disc bulge contacts the cord without deforming it.  There is mild to moderate left foraminal narrowing the right foramen open.  C7-T1:  Negative.  IMPRESSION:  1.  Negative for epidural abscess or other infectious process. 2.  Degenerative disease most notable at C5-6 where a disc bulge causes some deformity of the ventral cord and there is moderate bilateral foraminal narrowing.  MRI THORACIC SPINE  Findings: Vertebral body height, signal and alignment are maintained.  There is no epidural abscess and no evidence of diskitis/osteomyelitis.  The thoracic cord appears normal. Paraspinous structures demonstrate extensive airspace disease on the left.  Patchy airspace disease in the right upper lobe is also noted.  There are left worse than right pleural effusions.  No disc bulge or protrusion is identified.  Central canal and foramina are open at all levels.  IMPRESSION:  1.  Negative for epidural abscess or diskitis/osteomyelitis. 2.  Left much worse than right airspace disease consistent with pneumonia.  Associated left greater than right pleural effusions also identified.  MRI LUMBAR SPINE  Findings: There is fluid in the disc interspace at L3-4 with edema and enhancement throughout the L3 and L4 vertebral bodies.  Also seen is marked marrow edema about the right L4-5 facet with post contrast enhancement.  A large epidural abscess is identified posterior to the thecal sac extending from the inferior aspect of L4 to the S1-2 level.  The abscess measures approximately 5.6 cm cranial-caudal by up to 1.6 cm transverse by 1.1 cm AP.  There is mass effect on the thecal sac, worst at L5-S1 where the sac is  markedly compressed.  Given its posterior position, the abscess likely originates from the right L4-5 facet.  There is also a left psoas abscess which is incompletely visualized and measures approximately 2.2 cm transverse by 2.7 cm AP by 6 cm cranial- caudal.  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  There is no fracture or subluxation of the lumbar spine.  L1-2:  Disc bulge eccentric to the right without central canal stenosis.  There is some narrowing of the right lateral recess and foramen.  L2-3:  Disc bulge and ligamentum flavum thickening. There appears to be a small extruded disc fragment in the left lateral recess which could encroach on the descending left L3 root.  Finding could be due to a tiny epidural abscess although this is felt less likely.  L3-4:  No disc bulge or protrusion.  L4-5:  Disc bulge is identified.  No central canal stenosis due to disc.  Foramina are mildly narrowed.  L5-S1:  Negative for disc bulge or protrusion.  IMPRESSION:  1.  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  Large and posterior epidural abscess compresses the thecal sac from the L4 level to S1-2 as described above.  Also seen is a large left psoas abscess. Critical Value/emergent results were called by telephone at the time of interpretation on 01/04/2012  at 2:10 p.m.  to  Dr. Luciana Axe, who verbally acknowledged these results. 2.  Likely extruded disc fragment in the left lateral recess at L2- 3 could impact the descending left L3 root.  Original Report Authenticated By: Bernadene Bell. Maricela Curet, M.D.   Mr Thoracic Spine W Wo Contrast  01/04/2012  *RADIOLOGY REPORT*  Clinical Data:  Severe pain.  Question epidural abscess.  MRI CERVICAL, THORACIC AND LUMBAR SPINE WITHOUT AND WITH CONTRAST  Technique:  Multiplanar and multiecho pulse sequences of the cervical spine, to include the craniocervical junction and cervicothoracic junction, and thoracic and lumbar  spine, were obtained without and with intravenous contrast.  Contrast: 13mL MULTIHANCE GADOBENATE DIMEGLUMINE 529 MG/ML IV SOLN  Comparison:  CT abdomen and pelvis 12/30/2011 and CT chest 12/31/2011.  MRI CERVICAL SPINE  Findings:  Patient motion somewhat degrades study.  Vertebral body height, signal and alignment are maintained.  There is no epidural abscess.  No evidence of diskitis/osteomyelitis.  Cervical cord signal is normal and the craniocervical junction is normal. Paraspinous structures are unremarkable.  C2-3:  Negative.  C3-4:  Mild disc bulging uncovertebral disease.  Central canal is open.  Foramina are mildly narrowed.  C4-5:  Facet arthropathy and a mild disc bulge.  Central canal and foramina are open.  C5-6:  Disc bulge effaces the ventral thecal sac and causes some deformity of the cord.  Moderate bilateral foraminal narrowing is present.  C6-7:  Disc bulge contacts the cord without deforming it.  There is mild to moderate left foraminal narrowing the right foramen open.  C7-T1:  Negative.  IMPRESSION:  1.  Negative for epidural abscess or other infectious process. 2.  Degenerative disease most notable at C5-6 where a disc bulge causes some deformity of the ventral cord and there is moderate bilateral foraminal narrowing.  MRI THORACIC SPINE  Findings: Vertebral body height, signal and alignment are maintained.  There is no epidural abscess and no evidence of diskitis/osteomyelitis.  The thoracic cord appears normal. Paraspinous structures demonstrate extensive airspace disease on the left.  Patchy airspace disease in the right upper lobe is also noted.  There are left worse than right pleural effusions.  No disc bulge or protrusion is  identified.  Central canal and foramina are open at all levels.  IMPRESSION:  1.  Negative for epidural abscess or diskitis/osteomyelitis. 2.  Left much worse than right airspace disease consistent with pneumonia.  Associated left greater than right pleural  effusions also identified.  MRI LUMBAR SPINE  Findings: There is fluid in the disc interspace at L3-4 with edema and enhancement throughout the L3 and L4 vertebral bodies.  Also seen is marked marrow edema about the right L4-5 facet with post contrast enhancement.  A large epidural abscess is identified posterior to the thecal sac extending from the inferior aspect of L4 to the S1-2 level.  The abscess measures approximately 5.6 cm cranial-caudal by up to 1.6 cm transverse by 1.1 cm AP.  There is mass effect on the thecal sac, worst at L5-S1 where the sac is markedly compressed.  Given its posterior position, the abscess likely originates from the right L4-5 facet.  There is also a left psoas abscess which is incompletely visualized and measures approximately 2.2 cm transverse by 2.7 cm AP by 6 cm cranial- caudal.  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  There is no fracture or subluxation of the lumbar spine.  L1-2:  Disc bulge eccentric to the right without central canal stenosis.  There is some narrowing of the right lateral recess and foramen.  L2-3:  Disc bulge and ligamentum flavum thickening. There appears to be a small extruded disc fragment in the left lateral recess which could encroach on the descending left L3 root.  Finding could be due to a tiny epidural abscess although this is felt less likely.  L3-4:  No disc bulge or protrusion.  L4-5:  Disc bulge is identified.  No central canal stenosis due to disc.  Foramina are mildly narrowed.  L5-S1:  Negative for disc bulge or protrusion.  IMPRESSION:  1.  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  Large and posterior epidural abscess compresses the thecal sac from the L4 level to S1-2 as described above.  Also seen is a large left psoas abscess. Critical Value/emergent results were called by telephone at the time of interpretation on 01/04/2012  at 2:10 p.m.  to  Dr. Luciana Axe, who verbally  acknowledged these results. 2.  Likely extruded disc fragment in the left lateral recess at L2- 3 could impact the descending left L3 root.  Original Report Authenticated By: Bernadene Bell. Maricela Curet, M.D.   Mr Lumbar Spine W Wo Contrast  01/04/2012  *RADIOLOGY REPORT*  Clinical Data:  Severe pain.  Question epidural abscess.  MRI CERVICAL, THORACIC AND LUMBAR SPINE WITHOUT AND WITH CONTRAST  Technique:  Multiplanar and multiecho pulse sequences of the cervical spine, to include the craniocervical junction and cervicothoracic junction, and thoracic and lumbar spine, were obtained without and with intravenous contrast.  Contrast: 13mL MULTIHANCE GADOBENATE DIMEGLUMINE 529 MG/ML IV SOLN  Comparison:  CT abdomen and pelvis 12/30/2011 and CT chest 12/31/2011.  MRI CERVICAL SPINE  Findings:  Patient motion somewhat degrades study.  Vertebral body height, signal and alignment are maintained.  There is no epidural abscess.  No evidence of diskitis/osteomyelitis.  Cervical cord signal is normal and the craniocervical junction is normal. Paraspinous structures are unremarkable.  C2-3:  Negative.  C3-4:  Mild disc bulging uncovertebral disease.  Central canal is open.  Foramina are mildly narrowed.  C4-5:  Facet arthropathy and a mild disc bulge.  Central canal and foramina  are open.  C5-6:  Disc bulge effaces the ventral thecal sac and causes some deformity of the cord.  Moderate bilateral foraminal narrowing is present.  C6-7:  Disc bulge contacts the cord without deforming it.  There is mild to moderate left foraminal narrowing the right foramen open.  C7-T1:  Negative.  IMPRESSION:  1.  Negative for epidural abscess or other infectious process. 2.  Degenerative disease most notable at C5-6 where a disc bulge causes some deformity of the ventral cord and there is moderate bilateral foraminal narrowing.  MRI THORACIC SPINE  Findings: Vertebral body height, signal and alignment are maintained.  There is no epidural abscess and  no evidence of diskitis/osteomyelitis.  The thoracic cord appears normal. Paraspinous structures demonstrate extensive airspace disease on the left.  Patchy airspace disease in the right upper lobe is also noted.  There are left worse than right pleural effusions.  No disc bulge or protrusion is identified.  Central canal and foramina are open at all levels.  IMPRESSION:  1.  Negative for epidural abscess or diskitis/osteomyelitis. 2.  Left much worse than right airspace disease consistent with pneumonia.  Associated left greater than right pleural effusions also identified.  MRI LUMBAR SPINE  Findings: There is fluid in the disc interspace at L3-4 with edema and enhancement throughout the L3 and L4 vertebral bodies.  Also seen is marked marrow edema about the right L4-5 facet with post contrast enhancement.  A large epidural abscess is identified posterior to the thecal sac extending from the inferior aspect of L4 to the S1-2 level.  The abscess measures approximately 5.6 cm cranial-caudal by up to 1.6 cm transverse by 1.1 cm AP.  There is mass effect on the thecal sac, worst at L5-S1 where the sac is markedly compressed.  Given its posterior position, the abscess likely originates from the right L4-5 facet.  There is also a left psoas abscess which is incompletely visualized and measures approximately 2.2 cm transverse by 2.7 cm AP by 6 cm cranial- caudal.  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  There is no fracture or subluxation of the lumbar spine.  L1-2:  Disc bulge eccentric to the right without central canal stenosis.  There is some narrowing of the right lateral recess and foramen.  L2-3:  Disc bulge and ligamentum flavum thickening. There appears to be a small extruded disc fragment in the left lateral recess which could encroach on the descending left L3 root.  Finding could be due to a tiny epidural abscess although this is felt less likely.  L3-4:  No disc bulge or protrusion.  L4-5:   Disc bulge is identified.  No central canal stenosis due to disc.  Foramina are mildly narrowed.  L5-S1:  Negative for disc bulge or protrusion.  IMPRESSION:  1.  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  Large and posterior epidural abscess compresses the thecal sac from the L4 level to S1-2 as described above.  Also seen is a large left psoas abscess. Critical Value/emergent results were called by telephone at the time of interpretation on 01/04/2012  at 2:10 p.m.  to  Dr. Luciana Axe, who verbally acknowledged these results. 2.  Likely extruded disc fragment in the left lateral recess at L2- 3 could impact the descending left L3 root.  Original Report Authenticated By: Bernadene Bell. D'ALESSIO, M.D.   Dg Lumbar Spine 1 View  01/04/2012  *RADIOLOGY REPORT*  Clinical Data: Epidural abscess.  LUMBAR SPINE - 1 VIEW  Comparison: MRI dated 01/04/2012  Findings: Single lateral view demonstrates instruments at the L5-S1 level.  IMPRESSION: Instruments at L5-S1.  Original Report Authenticated By: Gwynn Burly, M.D.    Medications: I have reviewed the patient's current medications. Scheduled Meds:   . bacitracin      . bacitracin-polymyxin b   Left Eye QID  . ceFAZolin      . cefTAZidime (FORTAZ)  IV  1 g Intravenous Q8H  . cefTAZidime  100 mg Subconjunctival Once  . cefTAZidime  2.25 mg Intravitreal Once  . diazepam  10 mg Intravenous Once  . feeding supplement  237 mL Oral BID  . fentaNYL  12.5 mcg Intravenous Once  . fluconazole (DIFLUCAN) IV  400 mg Intravenous Q24H  . gadobenate dimeglumine  13 mL Intravenous Once  . gatifloxacin  1 drop Left Eye Q2H while awake  . HYDROmorphone      . HYDROmorphone      . pantoprazole  40 mg Oral BID AC  . sodium chloride      . sodium chloride  3 mL Intravenous Q12H  . vancomycin  750 mg Intravenous Q12H  . vancomycin  0.1 mg Intravitreal Once  . vancomycin  25 mg Subconjunctival Once   Continuous  Infusions:   . 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 Vomiting an diarrhea 1 week back with findings of left anterior uveitis and multilobar left sided pneumonia on CT scan.  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  Patient had MRI of her cervical thoracic and LS spine done on 3/5 showing epidural abscess over L4-S2 with left psoas abscess.   PLAN:   Left anterior uveitis complicated with panopthalmitis  Seen by opthalmology consult Dr Joneen Roach .  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  abx coverage broadened with IV vanco and cefepime ( 3/3) . Also added fluconazole for fungal coverage on 3/4.  -Dr Everardo Pacific performed vitrectomy on 3/4 with cx sent and will follow. As per opthalmology She does not Have retinal vision so recommends to hold off on surgery for now and monitor .  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 Need for drain in L psoas Cont empiric abx   Left sided multilobar PNA  Started on avelox And will  continue ( day 6)  sputum cx no growth so far  Swallow eval to r/o aspiration  No temp spike in 48 hrs  Cultures so far negative  Pulmonary following   Anemia  Iron deficiency noted on iron panel  b12 and folate wnl  Stool for  occult blood negative  Will monitor closely, hold off on trasnfusion For now until acute issues resolve  will need GI eval once acute issue resolved   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   Diarrhea  Prior to admission  c diff negative  resolved  DVT prophylaxis:   SCD  Full code   PT eval soon For CT scan of abd/pelvis with IV contrast to eval psoas muscle and possible MRI tomm for lumbar abscess- will check CR in AM  Consult:  opthalmology :[place] and Dr Everardo Pacific  ID : [place]  Pulmonary :[facility]     LOS: 6 days  Marlin Canary, DO 01/05/2012, 10:13 AM

## 2012-01-05 NOTE — Progress Notes (Signed)
INFECTIOUS DISEASE PROGRESS NOTE  ID: Donna Tapia is a 61 y.o. female with  Epidural/psoas abscess, endophthalmitis, cultures remain negative.   Subjective: pain  Abtx:  Anti-infectives     Start     Dose/Rate Route Frequency Ordered Stop   01/05/12 1300   cefTAZidime (FORTAZ) ophth injection 100 mg        100 mg Subconjunctival  Once 01/05/12 1247 01/05/12 1443   01/05/12 1300   vancomycin (VANCOCIN) ophth injection 25 mg        25 mg Subconjunctival  Once 01/05/12 1247 01/05/12 1446   01/05/12 1300   vancomycin (VANCOCIN) injection INJ 1 mg        1 mg Subconjunctival  Once 01/05/12 1247 01/05/12 1446   01/05/12 1200   cefTAZidime (FORTAZ) ophth injection 2.25 mg        2.25 mg Intravitreal  Once 01/05/12 1053 01/05/12 1444   01/04/12 2134   50,000 units bacitracin in 0.9% normal saline 250 mL irrigation  Status:  Discontinued          As needed 01/04/12 2145 01/04/12 2247   01/04/12 2100   bacitracin 16109 UNITS injection     Comments: AURAND, BRANDI: cabinet override         01/04/12 2100 01/05/12 0859   01/04/12 2100   ceFAZolin (ANCEF) 1-5 GM-% IVPB     Comments: AURAND, BRANDI: cabinet override         01/04/12 2100 01/05/12 0859   01/04/12 1830   fluconazole (DIFLUCAN) IVPB 400 mg        400 mg 200 mL/hr over 60 Minutes Intravenous Every 24 hours 01/03/12 1827     01/03/12 1930   fluconazole (DIFLUCAN) IVPB 800 mg        800 mg 400 mL/hr over 60 Minutes Intravenous  Once 01/03/12 1826 01/03/12 2139   01/03/12 1245   cefTAZidime (FORTAZ) ophth injection 2.25 mg        2.25 mg Intravitreal  Once 01/03/12 1135     01/03/12 1245   cefTAZidime (FORTAZ) ophth injection 100 mg        100 mg Subconjunctival  Once 01/03/12 1135     01/03/12 1200   vancomycin (VANCOCIN) ophth injection 0.1 mg        0.1 mg Intravitreal  Once 01/03/12 1130     01/03/12 1200   vancomycin (VANCOCIN) ophth injection 25 mg        25 mg Subconjunctival  Once 01/03/12 1131     01/03/12  0500   vancomycin (VANCOCIN) 750 mg in sodium chloride 0.9 % 150 mL IVPB        750 mg 150 mL/hr over 60 Minutes Intravenous Every 12 hours 01/02/12 1716     01/03/12 0200   ceFEPIme (MAXIPIME) 2 g in dextrose 5 % 50 mL IVPB  Status:  Discontinued        2 g 100 mL/hr over 30 Minutes Intravenous Every 8 hours 01/02/12 1716 01/02/12 2041   01/02/12 2200   cefTAZidime (FORTAZ) 1 g in dextrose 5 % 50 mL IVPB        1 g 100 mL/hr over 30 Minutes Intravenous 3 times per day 01/02/12 2106     01/02/12 1400   ceFEPIme (MAXIPIME) 2 g in dextrose 5 % 50 mL IVPB  Status:  Discontinued        2 g 100 mL/hr over 30 Minutes Intravenous 3 times per day 01/02/12 1318 01/02/12 1716  01/02/12 1400   vancomycin (VANCOCIN) 750 mg in sodium chloride 0.9 % 150 mL IVPB  Status:  Discontinued        750 mg 150 mL/hr over 60 Minutes Intravenous Every 12 hours 01/02/12 1350 01/02/12 1716   12/31/11 1530   valACYclovir (VALTREX) tablet 2,000 mg        2,000 mg Oral 2 times daily 12/31/11 1358 12/31/11 2235   12/31/11 0100   moxifloxacin (AVELOX) IVPB 400 mg  Status:  Discontinued        400 mg 250 mL/hr over 60 Minutes Intravenous Every 24 hours 12/31/11 0040 01/03/12 1913          Medications: I have reviewed the patient's current medications.  Objective: Vital signs in last 24 hours: Temp:  [98 F (36.7 C)-99.3 F (37.4 C)] 98.2 F (36.8 C) (03/06 1414) Pulse Rate:  [65-89] 89  (03/06 1414) Resp:  [18-26] 18  (03/06 1414) BP: (109-147)/(53-70) 146/70 mmHg (03/06 1414) SpO2:  [93 %-99 %] 94 % (03/06 1414)   Alert, nad Eye - improved CV rrr Lungs - + crackles Lab Results  Basename 01/05/12 0447 01/04/12 0653  WBC 18.5* 13.1*  HGB 10.9* 7.4*  HCT 32.6* 22.2*  NA 135 133*  K 4.4 3.8  CL 105 104  CO2 24 23  BUN 9 11  CREATININE 0.62 0.57  GLU -- --   Liver Panel No results found for this basename: PROT:2,ALBUMIN:2,AST:2,ALT:2,ALKPHOS:2,BILITOT:2,BILIDIR:2,IBILI:2 in the last 72  hours Sedimentation Rate No results found for this basename: ESRSEDRATE in the last 72 hours C-Reactive Protein No results found for this basename: CRP:2 in the last 72 hours  Microbiology: Recent Results (from the past 240 hour(s))  URINE CULTURE     Status: Normal   Collection Time   12/31/11  4:15 AM      Component Value Range Status Comment   Specimen Description URINE, CLEAN CATCH   Final    Special Requests NONE   Final    Culture  Setup Time [id number]   Final    Colony Count NO GROWTH   Final    Culture NO GROWTH   Final    Report Status 01/01/2012 FINAL   Final   CULTURE, BLOOD (ROUTINE X 2)     Status: Normal (Preliminary result)   Collection Time   12/31/11  8:42 AM      Component Value Range Status Comment   Specimen Description BLOOD LEFT ARM   Final    Special Requests BOTTLES DRAWN AEROBIC ONLY 10CC   Final    Culture  Setup Time [id number]   Final    Culture     Final    Value:        BLOOD CULTURE RECEIVED NO GROWTH TO DATE CULTURE WILL BE HELD FOR 5 DAYS BEFORE ISSUING A FINAL NEGATIVE REPORT   Report Status PENDING   Incomplete   CULTURE, BLOOD (ROUTINE X 2)     Status: Normal (Preliminary result)   Collection Time   12/31/11  8:48 AM      Component Value Range Status Comment   Specimen Description BLOOD LEFT HAND   Final    Special Requests BOTTLES DRAWN AEROBIC ONLY 10CC   Final    Culture  Setup Time [id number]   Final    Culture     Final    Value:        BLOOD CULTURE RECEIVED NO GROWTH TO DATE CULTURE WILL BE HELD FOR 5 DAYS  BEFORE ISSUING A FINAL NEGATIVE REPORT   Report Status PENDING   Incomplete   CULTURE, SPUTUM-ASSESSMENT     Status: Normal   Collection Time   12/31/11  2:15 PM      Component Value Range Status Comment   Specimen Description SPUTUM   Final    Special Requests NONE   Final    Sputum evaluation     Final    Value: THIS SPECIMEN IS ACCEPTABLE. RESPIRATORY CULTURE REPORT TO FOLLOW.   Report Status 12/31/2011 FINAL   Final     CULTURE, RESPIRATORY     Status: Normal   Collection Time   12/31/11  2:15 PM      Component Value Range Status Comment   Specimen Description SPUTUM   Final    Special Requests NONE   Final    Gram Stain     Final    Value: NO WBC SEEN     NO SQUAMOUS EPITHELIAL CELLS SEEN     FEW YEAST   Culture NORMAL OROPHARYNGEAL FLORA   Final    Report Status 01/03/2012 FINAL   Final   STOOL CULTURE     Status: Normal   Collection Time   12/31/11  2:45 PM      Component Value Range Status Comment   Specimen Description STOOL   Final    Special Requests NONE   Final    Culture     Final    Value: NO SALMONELLA, SHIGELLA, CAMPYLOBACTER, OR YERSINIA ISOLATED   Report Status 01/04/2012 FINAL   Final   CLOSTRIDIUM DIFFICILE BY PCR     Status: Normal   Collection Time   01/02/12 11:29 AM      Component Value Range Status Comment   C difficile by pcr NEGATIVE  NEGATIVE  Final   EYE CULTURE     Status: Normal (Preliminary result)   Collection Time   01/03/12  1:45 PM      Component Value Range Status Comment   Specimen Description EYE LEFT   Final    Special Requests     Final    Value: VTREOUS PT ON VANC CEFTAZ DO SENS TO ALL GROWTH INCLUDING COAG NEG STAPH PER MD   Culture NO GROWTH 1 DAY   Final    Report Status PENDING   Incomplete   EYE CULTURE     Status: Normal (Preliminary result)   Collection Time   01/03/12  1:45 PM      Component Value Range Status Comment   Specimen Description EYE LEFT   Final    Special Requests     Final    Value: ANTEROR CHAMBER PT ON VANC CEFTAZ DO SENS TO ALL GROWTH INCLUDING COAG NEG STAPH PER MD   Culture NO GROWTH 1 DAY   Final    Report Status PENDING   Incomplete   CULTURE, BLOOD (ROUTINE X 2)     Status: Normal (Preliminary result)   Collection Time   01/03/12  8:32 PM      Component Value Range Status Comment   Specimen Description BLOOD RIGHT HAND   Final    Special Requests BOTTLES DRAWN AEROBIC ONLY 10CC   Final    Culture  Setup Time [id number]    Final    Culture     Final    Value:        BLOOD CULTURE RECEIVED NO GROWTH TO DATE CULTURE WILL BE HELD FOR 5 DAYS BEFORE ISSUING A FINAL  NEGATIVE REPORT   Report Status PENDING   Incomplete   CULTURE, BLOOD (ROUTINE X 2)     Status: Normal (Preliminary result)   Collection Time   01/03/12  8:39 PM      Component Value Range Status Comment   Specimen Description BLOOD RIGHT FOREARM   Final    Special Requests BOTTLES DRAWN AEROBIC ONLY 10CC   Final    Culture  Setup Time [id number]   Final    Culture     Final    Value:        BLOOD CULTURE RECEIVED NO GROWTH TO DATE CULTURE WILL BE HELD FOR 5 DAYS BEFORE ISSUING A FINAL NEGATIVE REPORT   Report Status PENDING   Incomplete   CULTURE, SPUTUM-ASSESSMENT     Status: Normal   Collection Time   01/04/12  6:48 AM      Component Value Range Status Comment   Specimen Description SPUTUM   Final    Special Requests NONE   Final    Sputum evaluation     Final    Value: MICROSCOPIC FINDINGS SUGGEST THAT THIS SPECIMEN IS NOT REPRESENTATIVE OF LOWER RESPIRATORY SECRETIONS. PLEASE RECOLLECT.     CALLED TO [facility] RN 01/04/12 0800 COSTELLO B   Report Status 01/04/2012 FINAL   Final   MRSA PCR SCREENING     Status: Normal   Collection Time   01/04/12  4:20 PM      Component Value Range Status Comment   MRSA by PCR NEGATIVE  NEGATIVE  Final   CULTURE, ROUTINE-ABSCESS     Status: Normal (Preliminary result)   Collection Time   01/04/12  9:46 PM      Component Value Range Status Comment   Specimen Description ABSCESS BACK   Final    Special Requests PATIENT ON FOLLOWING LORTAZ, DIFLUCAN EPIDURAL   Final    Gram Stain PENDING   Incomplete    Culture NO GROWTH   Final    Report Status PENDING   Incomplete   ANAEROBIC CULTURE     Status: Normal (Preliminary result)   Collection Time   01/04/12  9:46 PM      Component Value Range Status Comment   Specimen Description ABSCESS BACK   Final    Special Requests PATIENT ON FOLLOWING LORTAZ, DIFLUCAN EPIDURAL    Final    Gram Stain     Final    Value: ABUNDANT WBC PRESENT,BOTH PMN AND MONONUCLEAR     NO ORGANISMS SEEN     Performed at [facility]   Culture     Final    Value: NO ANAEROBES ISOLATED; CULTURE IN PROGRESS FOR 5 DAYS   Report Status PENDING   Incomplete   GRAM STAIN     Status: Normal   Collection Time   01/04/12  9:46 PM      Component Value Range Status Comment   Specimen Description ABSCESS BACK   Final    Special Requests PATIENT ON FOLLOWING LORTAZ, DIFLUCAN EPIDURAL   Final    Gram Stain     Final    Value: ABUNDANT WBC PRESENT,BOTH PMN AND MONONUCLEAR     NO ORGANISMS SEEN   Report Status 01/04/2012 FINAL   Final     Studies/Results: Dg Chest 2 View  01/04/2012  *RADIOLOGY REPORT*  Clinical Data: Pneumonia.  CHEST - 2 VIEW  Comparison: 12/31/2011  Findings: Continued areas of consolidation throughout the left lung, most confluent in the left upper lobe.  Patchy right upper lobe opacities are again noted.  Biapical scarring.  Heart is normal size.  Bilateral pleural effusions, new since prior study. Increased since prior study.  IMPRESSION: Continued multi focal airspace opacities, likely pneumonia. Enlarging bilateral effusions.  Original Report Authenticated By: Cyndie Chime, M.D.   Mr Cervical Spine W Wo Contrast  01/04/2012  *RADIOLOGY REPORT*  Clinical Data:  Severe pain.  Question epidural abscess.  MRI CERVICAL, THORACIC AND LUMBAR SPINE WITHOUT AND WITH CONTRAST  Technique:  Multiplanar and multiecho pulse sequences of the cervical spine, to include the craniocervical junction and cervicothoracic junction, and thoracic and lumbar spine, were obtained without and with intravenous contrast.  Contrast: 13mL MULTIHANCE GADOBENATE DIMEGLUMINE 529 MG/ML IV SOLN  Comparison:  CT abdomen and pelvis 12/30/2011 and CT chest 12/31/2011.  MRI CERVICAL SPINE  Findings:  Patient motion somewhat degrades study.  Vertebral body height, signal and alignment are maintained.  There is  no epidural abscess.  No evidence of diskitis/osteomyelitis.  Cervical cord signal is normal and the craniocervical junction is normal. Paraspinous structures are unremarkable.  C2-3:  Negative.  C3-4:  Mild disc bulging uncovertebral disease.  Central canal is open.  Foramina are mildly narrowed.  C4-5:  Facet arthropathy and a mild disc bulge.  Central canal and foramina are open.  C5-6:  Disc bulge effaces the ventral thecal sac and causes some deformity of the cord.  Moderate bilateral foraminal narrowing is present.  C6-7:  Disc bulge contacts the cord without deforming it.  There is mild to moderate left foraminal narrowing the right foramen open.  C7-T1:  Negative.  IMPRESSION:  1.  Negative for epidural abscess or other infectious process. 2.  Degenerative disease most notable at C5-6 where a disc bulge causes some deformity of the ventral cord and there is moderate bilateral foraminal narrowing.  MRI THORACIC SPINE  Findings: Vertebral body height, signal and alignment are maintained.  There is no epidural abscess and no evidence of diskitis/osteomyelitis.  The thoracic cord appears normal. Paraspinous structures demonstrate extensive airspace disease on the left.  Patchy airspace disease in the right upper lobe is also noted.  There are left worse than right pleural effusions.  No disc bulge or protrusion is identified.  Central canal and foramina are open at all levels.  IMPRESSION:  1.  Negative for epidural abscess or diskitis/osteomyelitis. 2.  Left much worse than right airspace disease consistent with pneumonia.  Associated left greater than right pleural effusions also identified.  MRI LUMBAR SPINE  Findings: There is fluid in the disc interspace at L3-4 with edema and enhancement throughout the L3 and L4 vertebral bodies.  Also seen is marked marrow edema about the right L4-5 facet with post contrast enhancement.  A large epidural abscess is identified posterior to the thecal sac extending from the  inferior aspect of L4 to the S1-2 level.  The abscess measures approximately 5.6 cm cranial-caudal by up to 1.6 cm transverse by 1.1 cm AP.  There is mass effect on the thecal sac, worst at L5-S1 where the sac is markedly compressed.  Given its posterior position, the abscess likely originates from the right L4-5 facet.  There is also a left psoas abscess which is incompletely visualized and measures approximately 2.2 cm transverse by 2.7 cm AP by 6 cm cranial- caudal.  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  There is no fracture or subluxation of the lumbar spine.  L1-2:  Disc bulge eccentric to  the right without central canal stenosis.  There is some narrowing of the right lateral recess and foramen.  L2-3:  Disc bulge and ligamentum flavum thickening. There appears to be a small extruded disc fragment in the left lateral recess which could encroach on the descending left L3 root.  Finding could be due to a tiny epidural abscess although this is felt less likely.  L3-4:  No disc bulge or protrusion.  L4-5:  Disc bulge is identified.  No central canal stenosis due to disc.  Foramina are mildly narrowed.  L5-S1:  Negative for disc bulge or protrusion.  IMPRESSION:  1.  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  Large and posterior epidural abscess compresses the thecal sac from the L4 level to S1-2 as described above.  Also seen is a large left psoas abscess. Critical Value/emergent results were called by telephone at the time of interpretation on 01/04/2012  at 2:10 p.m.  to  Dr. Luciana Axe, who verbally acknowledged these results. 2.  Likely extruded disc fragment in the left lateral recess at L2- 3 could impact the descending left L3 root.  Original Report Authenticated By: Bernadene Bell. Maricela Curet, M.D.   Mr Thoracic Spine W Wo Contrast  01/04/2012  *RADIOLOGY REPORT*  Clinical Data:  Severe pain.  Question epidural abscess.  MRI CERVICAL, THORACIC  AND LUMBAR SPINE WITHOUT AND WITH CONTRAST  Technique:  Multiplanar and multiecho pulse sequences of the cervical spine, to include the craniocervical junction and cervicothoracic junction, and thoracic and lumbar spine, were obtained without and with intravenous contrast.  Contrast: 13mL MULTIHANCE GADOBENATE DIMEGLUMINE 529 MG/ML IV SOLN  Comparison:  CT abdomen and pelvis 12/30/2011 and CT chest 12/31/2011.  MRI CERVICAL SPINE  Findings:  Patient motion somewhat degrades study.  Vertebral body height, signal and alignment are maintained.  There is no epidural abscess.  No evidence of diskitis/osteomyelitis.  Cervical cord signal is normal and the craniocervical junction is normal. Paraspinous structures are unremarkable.  C2-3:  Negative.  C3-4:  Mild disc bulging uncovertebral disease.  Central canal is open.  Foramina are mildly narrowed.  C4-5:  Facet arthropathy and a mild disc bulge.  Central canal and foramina are open.  C5-6:  Disc bulge effaces the ventral thecal sac and causes some deformity of the cord.  Moderate bilateral foraminal narrowing is present.  C6-7:  Disc bulge contacts the cord without deforming it.  There is mild to moderate left foraminal narrowing the right foramen open.  C7-T1:  Negative.  IMPRESSION:  1.  Negative for epidural abscess or other infectious process. 2.  Degenerative disease most notable at C5-6 where a disc bulge causes some deformity of the ventral cord and there is moderate bilateral foraminal narrowing.  MRI THORACIC SPINE  Findings: Vertebral body height, signal and alignment are maintained.  There is no epidural abscess and no evidence of diskitis/osteomyelitis.  The thoracic cord appears normal. Paraspinous structures demonstrate extensive airspace disease on the left.  Patchy airspace disease in the right upper lobe is also noted.  There are left worse than right pleural effusions.  No disc bulge or protrusion is identified.  Central canal and foramina are open at  all levels.  IMPRESSION:  1.  Negative for epidural abscess or diskitis/osteomyelitis. 2.  Left much worse than right airspace disease consistent with pneumonia.  Associated left greater than right pleural effusions also identified.  MRI LUMBAR SPINE  Findings: There is fluid in  the disc interspace at L3-4 with edema and enhancement throughout the L3 and L4 vertebral bodies.  Also seen is marked marrow edema about the right L4-5 facet with post contrast enhancement.  A large epidural abscess is identified posterior to the thecal sac extending from the inferior aspect of L4 to the S1-2 level.  The abscess measures approximately 5.6 cm cranial-caudal by up to 1.6 cm transverse by 1.1 cm AP.  There is mass effect on the thecal sac, worst at L5-S1 where the sac is markedly compressed.  Given its posterior position, the abscess likely originates from the right L4-5 facet.  There is also a left psoas abscess which is incompletely visualized and measures approximately 2.2 cm transverse by 2.7 cm AP by 6 cm cranial- caudal.  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  There is no fracture or subluxation of the lumbar spine.  L1-2:  Disc bulge eccentric to the right without central canal stenosis.  There is some narrowing of the right lateral recess and foramen.  L2-3:  Disc bulge and ligamentum flavum thickening. There appears to be a small extruded disc fragment in the left lateral recess which could encroach on the descending left L3 root.  Finding could be due to a tiny epidural abscess although this is felt less likely.  L3-4:  No disc bulge or protrusion.  L4-5:  Disc bulge is identified.  No central canal stenosis due to disc.  Foramina are mildly narrowed.  L5-S1:  Negative for disc bulge or protrusion.  IMPRESSION:  1.  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  Large and posterior epidural abscess compresses the thecal sac from the L4 level to S1-2  as described above.  Also seen is a large left psoas abscess. Critical Value/emergent results were called by telephone at the time of interpretation on 01/04/2012  at 2:10 p.m.  to  Dr. Luciana Axe, who verbally acknowledged these results. 2.  Likely extruded disc fragment in the left lateral recess at L2- 3 could impact the descending left L3 root.  Original Report Authenticated By: Bernadene Bell. Maricela Curet, M.D.   Mr Lumbar Spine W Wo Contrast  01/04/2012  *RADIOLOGY REPORT*  Clinical Data:  Severe pain.  Question epidural abscess.  MRI CERVICAL, THORACIC AND LUMBAR SPINE WITHOUT AND WITH CONTRAST  Technique:  Multiplanar and multiecho pulse sequences of the cervical spine, to include the craniocervical junction and cervicothoracic junction, and thoracic and lumbar spine, were obtained without and with intravenous contrast.  Contrast: 13mL MULTIHANCE GADOBENATE DIMEGLUMINE 529 MG/ML IV SOLN  Comparison:  CT abdomen and pelvis 12/30/2011 and CT chest 12/31/2011.  MRI CERVICAL SPINE  Findings:  Patient motion somewhat degrades study.  Vertebral body height, signal and alignment are maintained.  There is no epidural abscess.  No evidence of diskitis/osteomyelitis.  Cervical cord signal is normal and the craniocervical junction is normal. Paraspinous structures are unremarkable.  C2-3:  Negative.  C3-4:  Mild disc bulging uncovertebral disease.  Central canal is open.  Foramina are mildly narrowed.  C4-5:  Facet arthropathy and a mild disc bulge.  Central canal and foramina are open.  C5-6:  Disc bulge effaces the ventral thecal sac and causes some deformity of the cord.  Moderate bilateral foraminal narrowing is present.  C6-7:  Disc bulge contacts the cord without deforming it.  There is mild to moderate left foraminal narrowing the right foramen open.  C7-T1:  Negative.  IMPRESSION:  1.  Negative for epidural abscess or other infectious process. 2.  Degenerative disease most notable at C5-6 where a disc bulge causes some  deformity of the ventral cord and there is moderate bilateral foraminal narrowing.  MRI THORACIC SPINE  Findings: Vertebral body height, signal and alignment are maintained.  There is no epidural abscess and no evidence of diskitis/osteomyelitis.  The thoracic cord appears normal. Paraspinous structures demonstrate extensive airspace disease on the left.  Patchy airspace disease in the right upper lobe is also noted.  There are left worse than right pleural effusions.  No disc bulge or protrusion is identified.  Central canal and foramina are open at all levels.  IMPRESSION:  1.  Negative for epidural abscess or diskitis/osteomyelitis. 2.  Left much worse than right airspace disease consistent with pneumonia.  Associated left greater than right pleural effusions also identified.  MRI LUMBAR SPINE  Findings: There is fluid in the disc interspace at L3-4 with edema and enhancement throughout the L3 and L4 vertebral bodies.  Also seen is marked marrow edema about the right L4-5 facet with post contrast enhancement.  A large epidural abscess is identified posterior to the thecal sac extending from the inferior aspect of L4 to the S1-2 level.  The abscess measures approximately 5.6 cm cranial-caudal by up to 1.6 cm transverse by 1.1 cm AP.  There is mass effect on the thecal sac, worst at L5-S1 where the sac is markedly compressed.  Given its posterior position, the abscess likely originates from the right L4-5 facet.  There is also a left psoas abscess which is incompletely visualized and measures approximately 2.2 cm transverse by 2.7 cm AP by 6 cm cranial- caudal.  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  There is no fracture or subluxation of the lumbar spine.  L1-2:  Disc bulge eccentric to the right without central canal stenosis.  There is some narrowing of the right lateral recess and foramen.  L2-3:  Disc bulge and ligamentum flavum thickening. There appears to be a small extruded disc fragment in  the left lateral recess which could encroach on the descending left L3 root.  Finding could be due to a tiny epidural abscess although this is felt less likely.  L3-4:  No disc bulge or protrusion.  L4-5:  Disc bulge is identified.  No central canal stenosis due to disc.  Foramina are mildly narrowed.  L5-S1:  Negative for disc bulge or protrusion.  IMPRESSION:  1.  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  Large and posterior epidural abscess compresses the thecal sac from the L4 level to S1-2 as described above.  Also seen is a large left psoas abscess. Critical Value/emergent results were called by telephone at the time of interpretation on 01/04/2012  at 2:10 p.m.  to  Dr. Luciana Axe, who verbally acknowledged these results. 2.  Likely extruded disc fragment in the left lateral recess at L2- 3 could impact the descending left L3 root.  Original Report Authenticated By: Bernadene Bell. D'ALESSIO, M.D.   Dg Lumbar Spine 1 View  01/04/2012  *RADIOLOGY REPORT*  Clinical Data: Epidural abscess.  LUMBAR SPINE - 1 VIEW  Comparison: MRI dated 01/04/2012  Findings: Single lateral view demonstrates instruments at the L5-S1 level.  IMPRESSION: Instruments at L5-S1.  Original Report Authenticated By: Gwynn Burly, M.D.     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  2)  Abscess - CT pending, to get drainage.  No positive cultures to date.  It also appears that the TEE has not been done, though had been ordered.  Continue fluconazole for now, continue vanco and ceftaz.    3) pneumonia - likely related to this disseminated infection.  Off avelox since she is on broad spectrum antibiotics.   Elbert Spickler Infectious Diseases 01/05/2012, 4:02 PM

## 2012-01-05 NOTE — Progress Notes (Signed)
ANTIBIOTIC CONSULT NOTE - FOLLOW UP  Pharmacy Consult for Vancomycin Indication: endopthalmitis  Allergies  Allergen Reactions  . Sulfur     Patient Measurements: Height: 5\' 9"  (175.3 cm) Weight: 137 lb 2 oz (62.2 kg) IBW/kg (Calculated) : 66.2   Vital Signs: Temp: 98.2 F (36.8 C) (03/06 1414) BP: 146/70 mmHg (03/06 1414) Pulse Rate: 89  (03/06 1414) Intake/Output from previous day: 03/05 0701 - 03/06 0700 In: 2300 [I.V.:1600; Blood:700] Out: 1680 [Urine:1600; Drains:80] Intake/Output from this shift:    Labs:  Basename 01/05/12 0447 01/04/12 0653 01/03/12 0624  WBC 18.5* 13.1* 14.7*  HGB 10.9* 7.4* 7.6*  PLT 665* 576* 492*  LABCREA -- -- --  CREATININE 0.62 0.57 0.55   Estimated Creatinine Clearance: 71.6 ml/min (by C-G formula based on Cr of 0.62).  Basename 01/05/12 1727  VANCOTROUGH 8.3*  VANCOPEAK --  Drue Dun --  GENTTROUGH --  GENTPEAK --  GENTRANDOM --  TOBRATROUGH --  TOBRAPEAK --  TOBRARND --  AMIKACINPEAK --  AMIKACINTROU --  AMIKACIN --     Microbiology: Recent Results (from the past 720 hour(s))  URINE CULTURE     Status: Normal   Collection Time   12/31/11  4:15 AM      Component Value Range Status Comment   Specimen Description URINE, CLEAN CATCH   Final    Special Requests NONE   Final    Culture  Setup Time [id number]   Final    Colony Count NO GROWTH   Final    Culture NO GROWTH   Final    Report Status 01/01/2012 FINAL   Final   CULTURE, BLOOD (ROUTINE X 2)     Status: Normal (Preliminary result)   Collection Time   12/31/11  8:42 AM      Component Value Range Status Comment   Specimen Description BLOOD LEFT ARM   Final    Special Requests BOTTLES DRAWN AEROBIC ONLY 10CC   Final    Culture  Setup Time [id number]   Final    Culture     Final    Value:        BLOOD CULTURE RECEIVED NO GROWTH TO DATE CULTURE WILL BE HELD FOR 5 DAYS BEFORE ISSUING A FINAL NEGATIVE REPORT   Report Status PENDING   Incomplete   CULTURE, BLOOD  (ROUTINE X 2)     Status: Normal (Preliminary result)   Collection Time   12/31/11  8:48 AM      Component Value Range Status Comment   Specimen Description BLOOD LEFT HAND   Final    Special Requests BOTTLES DRAWN AEROBIC ONLY 10CC   Final    Culture  Setup Time [id number]   Final    Culture     Final    Value:        BLOOD CULTURE RECEIVED NO GROWTH TO DATE CULTURE WILL BE HELD FOR 5 DAYS BEFORE ISSUING A FINAL NEGATIVE REPORT   Report Status PENDING   Incomplete   CULTURE, SPUTUM-ASSESSMENT     Status: Normal   Collection Time   12/31/11  2:15 PM      Component Value Range Status Comment   Specimen Description SPUTUM   Final    Special Requests NONE   Final    Sputum evaluation     Final    Value: THIS SPECIMEN IS ACCEPTABLE. RESPIRATORY CULTURE REPORT TO FOLLOW.   Report Status 12/31/2011 FINAL   Final   CULTURE, RESPIRATORY  Status: Normal   Collection Time   12/31/11  2:15 PM      Component Value Range Status Comment   Specimen Description SPUTUM   Final    Special Requests NONE   Final    Gram Stain     Final    Value: NO WBC SEEN     NO SQUAMOUS EPITHELIAL CELLS SEEN     FEW YEAST   Culture NORMAL OROPHARYNGEAL FLORA   Final    Report Status 01/03/2012 FINAL   Final   STOOL CULTURE     Status: Normal   Collection Time   12/31/11  2:45 PM      Component Value Range Status Comment   Specimen Description STOOL   Final    Special Requests NONE   Final    Culture     Final    Value: NO SALMONELLA, SHIGELLA, CAMPYLOBACTER, OR YERSINIA ISOLATED   Report Status 01/04/2012 FINAL   Final   CLOSTRIDIUM DIFFICILE BY PCR     Status: Normal   Collection Time   01/02/12 11:29 AM      Component Value Range Status Comment   C difficile by pcr NEGATIVE  NEGATIVE  Final   EYE CULTURE     Status: Normal (Preliminary result)   Collection Time   01/03/12  1:45 PM      Component Value Range Status Comment   Specimen Description EYE LEFT   Final    Special Requests     Final    Value:  VTREOUS PT ON VANC CEFTAZ DO SENS TO ALL GROWTH INCLUDING COAG NEG STAPH PER MD   Culture NO GROWTH 1 DAY   Final    Report Status PENDING   Incomplete   EYE CULTURE     Status: Normal (Preliminary result)   Collection Time   01/03/12  1:45 PM      Component Value Range Status Comment   Specimen Description EYE LEFT   Final    Special Requests     Final    Value: ANTEROR CHAMBER PT ON VANC CEFTAZ DO SENS TO ALL GROWTH INCLUDING COAG NEG STAPH PER MD   Culture NO GROWTH 1 DAY   Final    Report Status PENDING   Incomplete   CULTURE, BLOOD (ROUTINE X 2)     Status: Normal (Preliminary result)   Collection Time   01/03/12  8:32 PM      Component Value Range Status Comment   Specimen Description BLOOD RIGHT HAND   Final    Special Requests BOTTLES DRAWN AEROBIC ONLY 10CC   Final    Culture  Setup Time [id number]   Final    Culture     Final    Value:        BLOOD CULTURE RECEIVED NO GROWTH TO DATE CULTURE WILL BE HELD FOR 5 DAYS BEFORE ISSUING A FINAL NEGATIVE REPORT   Report Status PENDING   Incomplete   CULTURE, BLOOD (ROUTINE X 2)     Status: Normal (Preliminary result)   Collection Time   01/03/12  8:39 PM      Component Value Range Status Comment   Specimen Description BLOOD RIGHT FOREARM   Final    Special Requests BOTTLES DRAWN AEROBIC ONLY 10CC   Final    Culture  Setup Time [id number]   Final    Culture     Final    Value:        BLOOD CULTURE RECEIVED NO  GROWTH TO DATE CULTURE WILL BE HELD FOR 5 DAYS BEFORE ISSUING A FINAL NEGATIVE REPORT   Report Status PENDING   Incomplete   CULTURE, SPUTUM-ASSESSMENT     Status: Normal   Collection Time   01/04/12  6:48 AM      Component Value Range Status Comment   Specimen Description SPUTUM   Final    Special Requests NONE   Final    Sputum evaluation     Final    Value: MICROSCOPIC FINDINGS SUGGEST THAT THIS SPECIMEN IS NOT REPRESENTATIVE OF LOWER RESPIRATORY SECRETIONS. PLEASE RECOLLECT.     CALLED TO [facility] RN 01/04/12 0800 COSTELLO B     Report Status 01/04/2012 FINAL   Final   MRSA PCR SCREENING     Status: Normal   Collection Time   01/04/12  4:20 PM      Component Value Range Status Comment   MRSA by PCR NEGATIVE  NEGATIVE  Final   CULTURE, ROUTINE-ABSCESS     Status: Normal (Preliminary result)   Collection Time   01/04/12  9:46 PM      Component Value Range Status Comment   Specimen Description ABSCESS BACK   Final    Special Requests PATIENT ON FOLLOWING LORTAZ, DIFLUCAN EPIDURAL   Final    Gram Stain PENDING   Incomplete    Culture NO GROWTH   Final    Report Status PENDING   Incomplete   ANAEROBIC CULTURE     Status: Normal (Preliminary result)   Collection Time   01/04/12  9:46 PM      Component Value Range Status Comment   Specimen Description ABSCESS BACK   Final    Special Requests PATIENT ON FOLLOWING LORTAZ, DIFLUCAN EPIDURAL   Final    Gram Stain     Final    Value: ABUNDANT WBC PRESENT,BOTH PMN AND MONONUCLEAR     NO ORGANISMS SEEN     Performed at [facility]   Culture     Final    Value: NO ANAEROBES ISOLATED; CULTURE IN PROGRESS FOR 5 DAYS   Report Status PENDING   Incomplete   GRAM STAIN     Status: Normal   Collection Time   01/04/12  9:46 PM      Component Value Range Status Comment   Specimen Description ABSCESS BACK   Final    Special Requests PATIENT ON FOLLOWING LORTAZ, DIFLUCAN EPIDURAL   Final    Gram Stain     Final    Value: ABUNDANT WBC PRESENT,BOTH PMN AND MONONUCLEAR     NO ORGANISMS SEEN   Report Status 01/04/2012 FINAL   Final     Anti-infectives     Start     Dose/Rate Route Frequency Ordered Stop   01/05/12 1300   cefTAZidime (FORTAZ) ophth injection 100 mg        100 mg Subconjunctival  Once 01/05/12 1247 01/05/12 1443   01/05/12 1300   vancomycin (VANCOCIN) ophth injection 25 mg        25 mg Subconjunctival  Once 01/05/12 1247 01/05/12 1446   01/05/12 1300   vancomycin (VANCOCIN) injection INJ 1 mg        1 mg Subconjunctival  Once 01/05/12 1247 01/05/12 1446    01/05/12 1200   cefTAZidime (FORTAZ) ophth injection 2.25 mg        2.25 mg Intravitreal  Once 01/05/12 1053 01/05/12 1444   01/04/12 2134   50,000 units bacitracin in 0.9% normal saline  250 mL irrigation  Status:  Discontinued          As needed 01/04/12 2145 01/04/12 2247   01/04/12 2100   bacitracin 16109 UNITS injection     Comments: AURAND, BRANDI: cabinet override         01/04/12 2100 01/05/12 0859   01/04/12 2100   ceFAZolin (ANCEF) 1-5 GM-% IVPB     Comments: AURAND, BRANDI: cabinet override         01/04/12 2100 01/05/12 0859   01/04/12 1830   fluconazole (DIFLUCAN) IVPB 400 mg        400 mg 200 mL/hr over 60 Minutes Intravenous Every 24 hours 01/03/12 1827     01/03/12 1930   fluconazole (DIFLUCAN) IVPB 800 mg        800 mg 400 mL/hr over 60 Minutes Intravenous  Once 01/03/12 1826 01/03/12 2139   01/03/12 1245   cefTAZidime (FORTAZ) ophth injection 2.25 mg        2.25 mg Intravitreal  Once 01/03/12 1135     01/03/12 1245   cefTAZidime (FORTAZ) ophth injection 100 mg        100 mg Subconjunctival  Once 01/03/12 1135     01/03/12 1200   vancomycin (VANCOCIN) ophth injection 0.1 mg        0.1 mg Intravitreal  Once 01/03/12 1130     01/03/12 1200   vancomycin (VANCOCIN) ophth injection 25 mg        25 mg Subconjunctival  Once 01/03/12 1131     01/03/12 0500   vancomycin (VANCOCIN) 750 mg in sodium chloride 0.9 % 150 mL IVPB        750 mg 150 mL/hr over 60 Minutes Intravenous Every 12 hours 01/02/12 1716     01/03/12 0200   ceFEPIme (MAXIPIME) 2 g in dextrose 5 % 50 mL IVPB  Status:  Discontinued        2 g 100 mL/hr over 30 Minutes Intravenous Every 8 hours 01/02/12 1716 01/02/12 2041   01/02/12 2200   cefTAZidime (FORTAZ) 1 g in dextrose 5 % 50 mL IVPB        1 g 100 mL/hr over 30 Minutes Intravenous 3 times per day 01/02/12 2106     01/02/12 1400   ceFEPIme (MAXIPIME) 2 g in dextrose 5 % 50 mL IVPB  Status:  Discontinued        2 g 100 mL/hr over 30  Minutes Intravenous 3 times per day 01/02/12 1318 01/02/12 1716   01/02/12 1400   vancomycin (VANCOCIN) 750 mg in sodium chloride 0.9 % 150 mL IVPB  Status:  Discontinued        750 mg 150 mL/hr over 60 Minutes Intravenous Every 12 hours 01/02/12 1350 01/02/12 1716   12/31/11 1530   valACYclovir (VALTREX) tablet 2,000 mg        2,000 mg Oral 2 times daily 12/31/11 1358 12/31/11 2235   12/31/11 0100   moxifloxacin (AVELOX) IVPB 400 mg  Status:  Discontinued        400 mg 250 mL/hr over 60 Minutes Intravenous Every 24 hours 12/31/11 0040 01/03/12 1913          Assessment: Pt 61 yo female on D4 Vanc/Fortaz, D3 fluconazole for endophthalmitis, uvetitis. Work up in progress, ID now seeing, with lumbar wound epidural abscess- surgery completed, Vitrectomy performed 3.4.13, cxs NGTD . Vancomycin trough subtherapeutic (8.3), est. Vancomycin t1/2 ~ 8hrs. All doses charted at appropriate time. Renal function stable, est.  crcl ~ 70.    Goal of Therapy:  Vancomycin trough level 15-20 mcg/ml  Plan:  - change vancomycin to 750 mg IV Q8hrs - recheck vancomycin trough after 3-5 doses.  Riki Rusk 01/05/2012,7:36 PM

## 2012-01-05 NOTE — Consult Note (Signed)
Ophthalmology-Retina Donna Tapia                                                                               01/05/2012                                               Ophthalmology Evaluation                                          Consult Requested by: Dr. Antony Contras Consultation requested for: Endophthalmitis/Panophthalmitis OS  HPI: see previous notes.  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  Pertinent Medical History:  Active Ambulatory Problems    Diagnosis Date Noted  . No Active Ambulatory Problems   Resolved Ambulatory Problems    Diagnosis Date Noted  . No Resolved Ambulatory Problems   Past Medical History  Diagnosis Date  . Hepatitis B     Pertinent Ophthalmic History:  Cataract and Endogenous Endophthalmitis  Current Eye Medications: Injections with Ceftazidime 2.5 mg intra-vitreally today OS                                                                       Vancomycin 1 mg x 1 today  2:45 PM                                                                      Ceftazidome , 100mg , peri-ocular , sub-conjunctivally                                                                      Vancomycin 25 mg Sub-conjunctivally OS  Medications Prior to Admission  Medication Dose Route Frequency Provider Last Rate Last Dose  . 0.9 %  sodium chloride infusion   Intravenous Continuous Arshad N.  Toniann Fail, MD 75 mL/hr at 01/04/12 1436    . acetaminophen (TYLENOL) tablet 650 mg  650 mg Oral Q6H PRN [name], MD   650 mg at 12/31/11 2234   Or  . acetaminophen (TYLENOL) suppository 650 mg  650 mg Rectal Q6H PRN [name], MD      . acetaminophen (TYLENOL) tablet 650 mg  650 mg Oral Once [name], MD   650 mg at 12/30/11 1559  . apraclonidine (IOPIDINE) 1 % ophthalmic solution 1 drop   1 drop Left Eye To Minor [name], MD   1 drop at 12/30/11 1534  . bacitracin 11914 UNITS injection           . bacitracin-polymyxin b (POLYSPORIN) ophthalmic ointment   Left Eye QID Antony Contras, MD      . ceFAZolin (ANCEF) 1-5 GM-% IVPB           . cefTAZidime (FORTAZ) 1 g in dextrose 5 % 50 mL IVPB  1 g Intravenous Q8H [name], MD   1 g at 01/05/12 0612  . cefTAZidime (FORTAZ) ophth injection 100 mg  100 mg Subconjunctival Once [name], MD      . cefTAZidime Elita Quick) ophth injection 100 mg  100 mg Subconjunctival Once [name], MD      . cefTAZidime Elita Quick) ophth injection 2.25 mg  2.25 mg Intravitreal Once [name], MD      . cefTAZidime Elita Quick) ophth injection 2.25 mg  2.25 mg Intravitreal Once [name], MD      . diazepam (VALIUM) injection 10 mg  10 mg Intravenous Once Reinaldo Meeker, MD   10 mg at 01/05/12 1000  . feeding supplement (ENSURE CLINICAL STRENGTH) liquid 237 mL  237 mL Oral BID [name], RD   237 mL at 01/04/12 1435  . fentaNYL (SUBLIMAZE) injection 12.5 mcg  12.5 mcg Intravenous Once Maren Reamer, NP   12.5 mcg at 01/05/12 0650  . fentaNYL (SUBLIMAZE) injection 50 mcg  50 mcg Intravenous Once [name], MD   50 mcg at 12/30/11 1639  . fentaNYL (SUBLIMAZE) injection 50 mcg  50 mcg Intravenous Once [name], MD   50 mcg at 12/30/11 2040  . fluconazole (DIFLUCAN) IVPB 400 mg  400 mg Intravenous Q24H [name], MD   400 mg at 01/04/12 1830  . fluconazole (DIFLUCAN) IVPB 800 mg  800 mg Intravenous Once [name], MD   800 mg at 01/03/12 2039  . fluorescein ophthalmic strip 1 strip  1 strip Both Eyes Once [name], MD   1 strip at 12/30/11 1500  . gadobenate dimeglumine (MULTIHANCE) injection 13 mL  13 mL Intravenous Once PRN Medication Radiologist, MD   13 mL at 01/02/12 1646  . gadobenate dimeglumine (MULTIHANCE) injection 13 mL  13 mL Intravenous Once Medication Radiologist, MD   13 mL at 01/04/12 1345  .  gatifloxacin (ZYMAXID) 0.5 % ophthalmic drops 1 drop  1 drop Left Eye Q2H while awake Antony Contras, MD   1 drop at 01/05/12 1315  . HYDROcodone-acetaminophen (NORCO) 10-325 MG per tablet 1 tablet  1 tablet Oral Q4H PRN [name], MD   1 tablet at 01/05/12 1130  . HYDROmorphone (DILAUDID) 1 MG/ML injection           . HYDROmorphone (DILAUDID) 1 MG/ML injection           . HYDROmorphone (DILAUDID) injection 0.5 mg  0.5 mg Intravenous Q2H PRN Marlin Canary, DO      .  iohexol (OMNIPAQUE) 300 MG/ML solution 20 mL  20 mL Oral Q1 Hr x 2 Medication Radiologist, MD   20 mL at 12/30/11 1848  . iohexol (OMNIPAQUE) 300 MG/ML solution 20 mL  20 mL Oral Q1 Hr x 2 Medication Radiologist, MD      . iohexol (OMNIPAQUE) 300 MG/ML solution 70 mL  70 mL Intravenous Once PRN Medication Radiologist, MD   70 mL at 12/31/11 1730  . iohexol (OMNIPAQUE) 300 MG/ML solution 75 mL  75 mL Intravenous Once PRN Medication Radiologist, MD   75 mL at 12/30/11 1856  . metoCLOPramide (REGLAN) injection 10 mg  10 mg Intravenous Once PRN Constance Goltz, MD      . ondansetron [facility]) injection 4 mg  4 mg Intravenous Once [name], MD   4 mg at 12/30/11 1429  . ondansetron (ZOFRAN) tablet 4 mg  4 mg Oral Q6H PRN [name], MD   4 mg at 01/02/12 2034   Or  . ondansetron [facility]) injection 4 mg  4 mg Intravenous Q6H PRN [name], MD   4 mg at 01/03/12 0845  . pantoprazole (PROTONIX) EC tablet 40 mg  40 mg Oral BID AC Sandrea Hughs, MD   40 mg at 01/05/12 1130  . potassium chloride SA (K-DUR,KLOR-CON) CR tablet 40 mEq  40 mEq Oral Once [name], MD   40 mEq at 01/01/12 1001  . potassium chloride SA (K-DUR,KLOR-CON) CR tablet 40 mEq  40 mEq Oral Once [name], MD   40 mEq at 01/03/12 1851  . sodium chloride 0.9 % bolus 500 mL  500 mL Intravenous Once [name], MD   500 mL at 12/30/11 1429  . sodium chloride 0.9 % infusion           . sodium chloride 0.9 % injection 3 mL  3 mL  Intravenous Q12H [name], MD   3 mL at 01/05/12 1001  . tetracaine (PONTOCAINE) 0.5 % ophthalmic solution 2 drop  2 drop Both Eyes Once [name], MD   2 drop at 12/30/11 1502  . timolol (TIMOPTIC) 0.5 % ophthalmic solution 1 drop  1 drop Left Eye Once [name], MD   1 drop at 12/30/11 1600  . tuberculin injection 5 Units  5 Units Intradermal Once Antony Contras, MD   5 Units at 12/30/11 2044  . valACYclovir (VALTREX) tablet 2,000 mg  2,000 mg Oral BID [name], MD   2,000 mg at 12/31/11 2235  . vancomycin (VANCOCIN) 750 mg in sodium chloride 0.9 % 150 mL IVPB  750 mg Intravenous Q12H [name], MD   750 mg at 01/05/12 0544  . vancomycin (VANCOCIN) injection INJ 1 mg  1 mg Subconjunctival Once [name], MD      . vancomycin [facility]) ophth injection 0.1 mg  0.1 mg Intravitreal Once [name], MD      . vancomycin [facility]) ophth injection 25 mg  25 mg Subconjunctival Once [name], MD      . vancomycin [facility]) ophth injection 25 mg  25 mg Subconjunctival Once [name], MD      . white petrolatum (VASELINE) gel        1 application at 01/02/12 0945  . DISCONTD: 0.9 % irrigation (POUR BTL)    PRN Reinaldo Meeker, MD   1,000 mL at 01/04/12 2134  . DISCONTD: 50,000 units bacitracin in 0.9% normal saline 250 mL irrigation    PRN Reinaldo Meeker, MD      .  DISCONTD: acyclovir ointment (ZOVIRAX) 5 %   Topical Q3H [name], MD      . DISCONTD: bacitracin-polymyxin b (POLYSPORIN) ophthalmic ointment   Left Eye BID [name], MD      . DISCONTD: ceFEPIme (MAXIPIME) 2 g in dextrose 5 % 50 mL IVPB  2 g Intravenous Q8H [name], MD      . DISCONTD: ceFEPIme (MAXIPIME) 2 g in dextrose 5 % 50 mL IVPB  2 g Intravenous Q8H [name], MD      . DISCONTD: fentaNYL (SUBLIMAZE) injection 25 mcg  25 mcg Intravenous Q3H PRN [name], MD   25 mcg at 01/05/12 0500  . DISCONTD: hemostatic agents    PRN Reinaldo Meeker, MD   1  application at 01/04/12 2134  . DISCONTD: hemostatic agents    PRN Reinaldo Meeker, MD   1 application at 01/04/12 2205  . DISCONTD: HYDROmorphone (DILAUDID) injection 0.25-0.5 mg  0.25-0.5 mg Intravenous Q5 min PRN Constance Goltz, MD   0.5 mg at 01/04/12 2341  . DISCONTD: methylPREDNISolone sodium succinate (SOLU-MEDROL) 125 MG injection 80 mg  80 mg Intravenous Q12H Sandrea Hughs, MD   80 mg at 01/03/12 1217  . DISCONTD: moxifloxacin (AVELOX) IVPB 400 mg  400 mg Intravenous Q24H [name], MD   400 mg at 01/03/12 0045  . DISCONTD: prednisoLONE acetate (PRED FORTE) 1 % ophthalmic suspension 1 drop  1 drop Left Eye Q1H while awake Antony Contras, MD   1 drop at 01/02/12 1233  . DISCONTD: scopolamine (HYOSCINE) 0.25 % ophthalmic solution 1 drop  1 drop Left Eye BID Antony Contras, MD   1 drop at 01/02/12 1143  . DISCONTD: thrombin spray    PRN Reinaldo Meeker, MD   5,000 Units at 01/04/12 2134  . DISCONTD: vancomycin (VANCOCIN) 750 mg in sodium chloride 0.9 % 150 mL IVPB  750 mg Intravenous Q12H [name], MD   750 mg at 01/02/12 1708   No current outpatient prescriptions on file as of 01/05/2012.  Retina detached on BScan ultrasound at the bedside  Ophthalmic ROS: Pain, drainage, redness, swelling, poor vision  Confrontation Visual Fields:   [id number]  Pupils:   OS:    VA:  No light perception OS.   Intraocular Pressure:   right eye    [ x ]  Deferred                                             left eye   [ x ]  Deferred              Lids/Lashes:     OS: Edema and chemosis regressing  Conjunctiva:    OS: chemotic , exudate on surface   Sclera:    OS: poor view    Cornea:    OS: stomal haze  Anterior Chamber:    OS: epi-defect reduced Iris:      OS: fixed pupil Lens:    OS: Cataract            Vitreous:   OS:  No View Optic Nerve:    Procedure:  Repeat Intravitreal Ceftazidime, 2.25mg  and Vancomycin 1mg .                       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  Discussion: Given prognosis visually I am deferring aggressive intervention re the left eye as her overall condition remains poor, though hopefully improving with hte drainage of her abcess. I will continue to follow along with you and Dr. Randon Goldsmith for now.  Plan: Apply Polymyxic-Bacitracin  To the exposed conjunctiva and over lids. This will protect against dried concretions.   [name] MD

## 2012-01-06 ENCOUNTER — Inpatient Hospital Stay (HOSPITAL_COMMUNITY): Payer: Medicaid Other

## 2012-01-06 LAB — CBC
HCT: 32.2 % — ABNORMAL LOW (ref 36.0–46.0)
Hemoglobin: 10.9 g/dL — ABNORMAL LOW (ref 12.0–15.0)
MCV: 91.2 fL (ref 78.0–100.0)
RBC: 3.53 MIL/uL — ABNORMAL LOW (ref 3.87–5.11)
WBC: 18.5 10*3/uL — ABNORMAL HIGH (ref 4.0–10.5)

## 2012-01-06 LAB — MPO/PR-3 (ANCA) ANTIBODIES: Myeloperoxidase Abs: 1 AU/mL (ref <20)

## 2012-01-06 LAB — CYTOMEGALOVIRUS PCR, QUALITATIVE

## 2012-01-06 LAB — BASIC METABOLIC PANEL
BUN: 9 mg/dL (ref 6–23)
CO2: 25 mEq/L (ref 19–32)
Chloride: 97 mEq/L (ref 96–112)
Creatinine, Ser: 0.53 mg/dL (ref 0.50–1.10)

## 2012-01-06 LAB — CULTURE, BLOOD (ROUTINE X 2)
Culture  Setup Time: 201303012255
Culture: NO GROWTH
Culture: NO GROWTH

## 2012-01-06 LAB — GLOMERULAR BASEMENT MEMBRANE ANTIBODIES: GBM Ab: <1 AU/mL (ref <20)

## 2012-01-06 LAB — HLA-B27 ANTIGEN: DNA Result:: NOT DETECTED

## 2012-01-06 MED ORDER — ENOXAPARIN SODIUM 40 MG/0.4ML ~~LOC~~ SOLN
40.0000 mg | SUBCUTANEOUS | Status: DC
  Administered 2012-01-06: 40 mg via SUBCUTANEOUS
  Filled 2012-01-06 (×2): qty 0.4

## 2012-01-06 MED ORDER — GADOBENATE DIMEGLUMINE 529 MG/ML IV SOLN
14.0000 mL | Freq: Once | INTRAVENOUS | Status: AC | PRN
  Administered 2012-01-06: 14 mL via INTRAVENOUS

## 2012-01-06 NOTE — Progress Notes (Signed)
Subjective: Patient feeling better, no CP, no fever, no chills   Objective: Vital signs in last 24 hours: Filed Vitals:   01/05/12 1414 01/05/12 2133 01/06/12 0527 01/06/12 1325  BP: 146/70 151/73 137/64 143/65  Pulse: 89 78 78 85  Temp: 98.2 F (36.8 C) 98.8 F (37.1 C) 99 F (37.2 C) 99.4 F (37.4 C)  TempSrc:  Oral Oral   Resp: 18 20 18 18   Height:      Weight:   63.7 kg (140 lb 6.9 oz)   SpO2: 94% 93% 95% 94%   Weight change:   Intake/Output Summary (Last 24 hours) at 01/06/12 1409 Last data filed at 01/06/12 1122  Gross per 24 hour  Intake    606 ml  Output   2002 ml  Net  -1396 ml    Physical Exam: General: Awake, Oriented, appears in pain HEENT: L eye orbital swelling/dryness Neck: Supple CV: S1 and S2, rrr Lungs: crackles L side Abdomen: Soft, Nontender, Nondistended, +bowel sounds. Ext: Good pulses. No edema., weakness B/L Back: drain in  Lab Results:  Basename 01/06/12 0531 01/05/12 0447  NA 132* 135  K 3.7 4.4  CL 97 105  CO2 25 24  GLUCOSE 70 74  BUN 9 9  CREATININE 0.53 0.62  CALCIUM 7.5* 8.0*  MG -- --  PHOS -- --   No results found for this basename: AST:2,ALT:2,ALKPHOS:2,BILITOT:2,PROT:2,ALBUMIN:2 in the last 72 hours No results found for this basename: LIPASE:2,AMYLASE:2 in the last 72 hours  Basename 01/06/12 0531 01/05/12 0447  WBC 18.5* 18.5*  NEUTROABS -- --  HGB 10.9* 10.9*  HCT 32.2* 32.6*  MCV 91.2 92.4  PLT 682* 665*   No results found for this basename: CKTOTAL:3,CKMB:3,CKMBINDEX:3,TROPONINI:3 in the last 72 hours No components found with this basename: POCBNP:3 No results found for this basename: DDIMER:2 in the last 72 hours No results found for this basename: HGBA1C:2 in the last 72 hours No results found for this basename: CHOL:2,HDL:2,LDLCALC:2,TRIG:2,CHOLHDL:2,LDLDIRECT:2 in the last 72 hours No results found for this basename: TSH,T4TOTAL,FREET3,T3FREE,THYROIDAB in the last 72 hours No results found for this  basename: VITAMINB12:2,FOLATE:2,FERRITIN:2,TIBC:2,IRON:2,RETICCTPCT:2 in the last 72 hours  Micro Results: Recent Results (from the past 240 hour(s))  URINE CULTURE     Status: Normal   Collection Time   12/31/11  4:15 AM      Component Value Range Status Comment   Specimen Description URINE, CLEAN CATCH   Final    Special Requests NONE   Final    Culture  Setup Time [id number]   Final    Colony Count NO GROWTH   Final    Culture NO GROWTH   Final    Report Status 01/01/2012 FINAL   Final   CULTURE, BLOOD (ROUTINE X 2)     Status: Normal   Collection Time   12/31/11  8:42 AM      Component Value Range Status Comment   Specimen Description BLOOD LEFT ARM   Final    Special Requests BOTTLES DRAWN AEROBIC ONLY 10CC   Final    Culture  Setup Time [id number]   Final    Culture NO GROWTH 5 DAYS   Final    Report Status 01/06/2012 FINAL   Final   CULTURE, BLOOD (ROUTINE X 2)     Status: Normal   Collection Time   12/31/11  8:48 AM      Component Value Range Status Comment   Specimen Description BLOOD LEFT HAND   Final  Special Requests BOTTLES DRAWN AEROBIC ONLY 10CC   Final    Culture  Setup Time [id number]   Final    Culture NO GROWTH 5 DAYS   Final    Report Status 01/06/2012 FINAL   Final   CULTURE, SPUTUM-ASSESSMENT     Status: Normal   Collection Time   12/31/11  2:15 PM      Component Value Range Status Comment   Specimen Description SPUTUM   Final    Special Requests NONE   Final    Sputum evaluation     Final    Value: THIS SPECIMEN IS ACCEPTABLE. RESPIRATORY CULTURE REPORT TO FOLLOW.   Report Status 12/31/2011 FINAL   Final   CULTURE, RESPIRATORY     Status: Normal   Collection Time   12/31/11  2:15 PM      Component Value Range Status Comment   Specimen Description SPUTUM   Final    Special Requests NONE   Final    Gram Stain     Final    Value: NO WBC SEEN     NO SQUAMOUS EPITHELIAL CELLS SEEN     FEW YEAST   Culture NORMAL OROPHARYNGEAL FLORA   Final    Report  Status 01/03/2012 FINAL   Final   STOOL CULTURE     Status: Normal   Collection Time   12/31/11  2:45 PM      Component Value Range Status Comment   Specimen Description STOOL   Final    Special Requests NONE   Final    Culture     Final    Value: NO SALMONELLA, SHIGELLA, CAMPYLOBACTER, OR YERSINIA ISOLATED   Report Status 01/04/2012 FINAL   Final   CLOSTRIDIUM DIFFICILE BY PCR     Status: Normal   Collection Time   01/02/12 11:29 AM      Component Value Range Status Comment   C difficile by pcr NEGATIVE  NEGATIVE  Final   EYE CULTURE     Status: Normal (Preliminary result)   Collection Time   01/03/12  1:45 PM      Component Value Range Status Comment   Specimen Description EYE LEFT   Final    Special Requests     Final    Value: VTREOUS PT ON VANC CEFTAZ DO SENS TO ALL GROWTH INCLUDING COAG NEG STAPH PER MD   Culture NO GROWTH 2 DAYS   Final    Report Status PENDING   Incomplete   EYE CULTURE     Status: Normal (Preliminary result)   Collection Time   01/03/12  1:45 PM      Component Value Range Status Comment   Specimen Description EYE LEFT   Final    Special Requests     Final    Value: ANTEROR CHAMBER PT ON VANC CEFTAZ DO SENS TO ALL GROWTH INCLUDING COAG NEG STAPH PER MD   Culture NO GROWTH 2 DAYS   Final    Report Status PENDING   Incomplete   CULTURE, BLOOD (ROUTINE X 2)     Status: Normal (Preliminary result)   Collection Time   01/03/12  8:32 PM      Component Value Range Status Comment   Specimen Description BLOOD RIGHT HAND   Final    Special Requests BOTTLES DRAWN AEROBIC ONLY 10CC   Final    Culture  Setup Time [id number]   Final    Culture     Final  Value:        BLOOD CULTURE RECEIVED NO GROWTH TO DATE CULTURE WILL BE HELD FOR 5 DAYS BEFORE ISSUING A FINAL NEGATIVE REPORT   Report Status PENDING   Incomplete   CULTURE, BLOOD (ROUTINE X 2)     Status: Normal (Preliminary result)   Collection Time   01/03/12  8:39 PM      Component Value Range Status Comment    Specimen Description BLOOD RIGHT FOREARM   Final    Special Requests BOTTLES DRAWN AEROBIC ONLY 10CC   Final    Culture  Setup Time [id number]   Final    Culture     Final    Value:        BLOOD CULTURE RECEIVED NO GROWTH TO DATE CULTURE WILL BE HELD FOR 5 DAYS BEFORE ISSUING A FINAL NEGATIVE REPORT   Report Status PENDING   Incomplete   CULTURE, SPUTUM-ASSESSMENT     Status: Normal   Collection Time   01/04/12  6:48 AM      Component Value Range Status Comment   Specimen Description SPUTUM   Final    Special Requests NONE   Final    Sputum evaluation     Final    Value: MICROSCOPIC FINDINGS SUGGEST THAT THIS SPECIMEN IS NOT REPRESENTATIVE OF LOWER RESPIRATORY SECRETIONS. PLEASE RECOLLECT.     CALLED TO [facility] RN 01/04/12 0800 COSTELLO B   Report Status 01/04/2012 FINAL   Final   MRSA PCR SCREENING     Status: Normal   Collection Time   01/04/12  4:20 PM      Component Value Range Status Comment   MRSA by PCR NEGATIVE  NEGATIVE  Final   CULTURE, ROUTINE-ABSCESS     Status: Normal (Preliminary result)   Collection Time   01/04/12  9:46 PM      Component Value Range Status Comment   Specimen Description ABSCESS BACK   Final    Special Requests PATIENT ON FOLLOWING LORTAZ, DIFLUCAN EPIDURAL   Final    Gram Stain PENDING   Incomplete    Culture NO GROWTH 1 DAY   Final    Report Status PENDING   Incomplete   ANAEROBIC CULTURE     Status: Normal (Preliminary result)   Collection Time   01/04/12  9:46 PM      Component Value Range Status Comment   Specimen Description ABSCESS BACK   Final    Special Requests PATIENT ON FOLLOWING LORTAZ, DIFLUCAN EPIDURAL   Final    Gram Stain     Final    Value: ABUNDANT WBC PRESENT,BOTH PMN AND MONONUCLEAR     NO ORGANISMS SEEN     Performed at [facility]   Culture     Final    Value: NO ANAEROBES ISOLATED; CULTURE IN PROGRESS FOR 5 DAYS   Report Status PENDING   Incomplete   GRAM STAIN     Status: Normal   Collection Time   01/04/12  9:46 PM       Component Value Range Status Comment   Specimen Description ABSCESS BACK   Final    Special Requests PATIENT ON FOLLOWING LORTAZ, DIFLUCAN EPIDURAL   Final    Gram Stain     Final    Value: ABUNDANT WBC PRESENT,BOTH PMN AND MONONUCLEAR     NO ORGANISMS SEEN   Report Status 01/04/2012 FINAL   Final     Studies/Results: Mr Lumbar Spine W Wo Contrast  01/06/2012  *  RADIOLOGY REPORT*  Clinical Data: Follow-up epidural abscess.  Status post surgical decompression.  MRI LUMBAR SPINE WITHOUT AND WITH CONTRAST  Technique:  Multiplanar and multiecho pulse sequences of the lumbar spine were obtained without and with intravenous contrast.  Contrast: 14mL MULTIHANCE GADOBENATE DIMEGLUMINE 529 MG/ML IV SOLN  Comparison: Prior study 01/04/2012.  Findings: There are surgical changes related to recent L5-S1 wide decompressive laminectomy.  Evacuation of prior epidural abscess is noted with a postop fluid collection in the laminectomy defect area.  Persistent abnormal epidural fluid collections with peripheral enhancement extending from the top of L3 down to L5 consistent with persistent areas of abscess.  There is also a persistent abscess emanating from the right L4-5 facet joint.  The left psoas abscess is slightly larger.  It measures 3.6 x 2.9 cm.  Stable changes of diskitis and osteomyelitis at L3-4.  Stable degenerative disc disease.  IMPRESSION:  1.  Postoperative changes at L5-S1 with wide decompressive laminectomy and evacuation of posterior epidural abscess.  A postoperative fluid collection is noted in the laminectomy defect area. 2.  Persistent areas of small epidural abscesses extending from the top of L3 down to L5. 3.  Persistent diskitis and osteomyelitis at L3-4. 4.  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 5.  Enlarging left psoas abscess.  This was made a call report.  Original Report Authenticated By: P. Loralie Champagne, M.D.   Ct  Abdomen Pelvis W Contrast  01/05/2012  *RADIOLOGY REPORT*  Clinical Data: Severe abdominal pain.  Back pain.  Status post back surgery.  Abdominal drain.  Evaluate psoas muscle abscess.  CT ABDOMEN AND PELVIS WITH CONTRAST  Technique:  Multidetector CT imaging of the abdomen and pelvis was performed following the standard protocol during bolus administration of intravenous contrast.  Contrast: OMNIPAQUE IOHEXOL 300 MG/ML IJ SOLN  Comparison: MRI of the lumbar spine without and with contrast 01/04/2012.The CT of the abdomen and pelvis 12/30/2011.  Findings: A 2.7 x 3.9 x 9.1 7 meters peripherally enhancing collection is present in the left psoas muscle, compatible with an abscess.  There are small extraperitoneal lymph nodes on the left.  The patient is status post L5 and S1 laminectomy.  There is gas within the operative site.  A surgical drain is in place.  A diffuse disc herniation is present L4-5.  Gas is present within an inferiorly extruded disc at L2-3.  Fluid or soft tissue is present within the presacral space.  Moderate bilateral pleural effusions are present.  There is associated dependent atelectasis.  The lungs are otherwise clear. The heart size is normal.  Note is made of a pectus excavatum deformity.  The liver and spleen are within normal limits.  The common bile duct and gallbladder are normal.  The adrenal glands and kidneys are within normal limits.  Atherosclerotic calcifications are present within the aorta branch vessels.  There is no aneurysm.  Fluid or soft tissue is present within the presacral space. Diverticular changes are present within the rectosigmoid colon.  No focal inflammation is evident.  The remainder of the colon is within normal limits.  The appendix is not discretely visualized.  The bone windows demonstrate marked dextroconvex curvature lumbar spine, centered at L3.  A hemangioma is present at L1.  No other focal lytic or blastic lesions are evident.  IMPRESSION:  1.  9  cm peripherally enhancing fluid collection in the left psoas muscle. 2.  Fluid or soft tissue  within the presacral space.  This likely represents reactive fluid. 3.  Residual gas is present in the paraspinal soft tissues with surgical drains in place.  4.  Fluid or soft tissue in the presacral space.  This likely represents reactive fluid. 5.  Bilateral pleural effusions and associated atelectasis.  Original Report Authenticated By: Jamesetta Orleans. MATTERN, M.D.   Dg Lumbar Spine 1 View  01/04/2012  *RADIOLOGY REPORT*  Clinical Data: Epidural abscess.  LUMBAR SPINE - 1 VIEW  Comparison: MRI dated 01/04/2012  Findings: Single lateral view demonstrates instruments at the L5-S1 level.  IMPRESSION: Instruments at L5-S1.  Original Report Authenticated By: Gwynn Burly, M.D.    Medications: I have reviewed the patient's current medications. Scheduled Meds:    . bacitracin-polymyxin b   Left Eye QID  . cefTAZidime (FORTAZ)  IV  1 g Intravenous Q8H  . cefTAZidime  100 mg Subconjunctival Once  . cefTAZidime  100 mg Subconjunctival Once  . cefTAZidime  2.25 mg Intravitreal Once  . cefTAZidime  2.25 mg Intravitreal Once  . feeding supplement  237 mL Oral BID  . fluconazole (DIFLUCAN) IV  400 mg Intravenous Q24H  . gatifloxacin  1 drop Left Eye Q2H while awake  . iohexol  20 mL Oral Q1 Hr x 2  . pantoprazole  40 mg Oral BID AC  . sodium chloride  3 mL Intravenous Q12H  . vancomycin  750 mg Intravenous Q8H  . vancomycin  1 mg Subconjunctival Once  . vancomycin  0.1 mg Intravitreal Once  . vancomycin  25 mg Subconjunctival Once  . vancomycin  25 mg Subconjunctival Once  . DISCONTD: vancomycin  750 mg Intravenous Q12H   Continuous Infusions:    . sodium chloride 75 mL/hr at 01/04/12 1436   PRN Meds:.acetaminophen, acetaminophen, gadobenate dimeglumine, HYDROcodone-acetaminophen, HYDROmorphone (DILAUDID) injection, iohexol, ondansetron (ZOFRAN) IV, ondansetron, DISCONTD:  iohexol  Assessment/Plan: Assessment:  4 female with chr low back pain and hx of hep B presents with 2 days hx of painless left eye blindness with hx of nausea. Vomiting an diarrhea 1 week back with findings of left anterior uveitis and multilobar left sided pneumonia on CT scan.  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  abx coverage broadened with IV vanco and cefepime ( 3/3) . Also added fluconazole for fungal coverage on 3/4.  -Dr Everardo Pacific performed vitrectomy on 3/4 with cx sent and will follow. As per opthalmology She does not Have retinal vision so recommends to hold off on surgery for now and monitor .  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 Need for drain in L psoas Cont empiric abx  Needs to be drained- defer to surgery MRI done today showed: 1. Postoperative changes at L5-S1 with wide decompressive  laminectomy and evacuation of posterior epidural abscess. A  postoperative fluid collection is noted in the laminectomy  defect  area.  2. Persistent areas of small epidural abscesses extending from the  top of L3 down to L5.  3. Persistent diskitis and osteomyelitis at L3-4.  4. Persistent septic arthritis involving the right L4-5 facet  joint with associated adjacent posterior abscess.  5. Enlarging left psoas abscess.   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   Diarrhea  Prior to admission  c diff negative  resolved    DVT prophylaxis: restart lovenox SCD   Full code   Consult:  opthalmology :[place] and Dr Everardo Pacific  ID : [place]  Pulmonary :[place]  TO DO Will arrange TEE for complete evaluation on Monday (by Corinda Gubler) as patient is having psoas muscle abscess drainage     LOS: 7 days  Sharhonda Atwood, DO 01/06/2012, 2:09 PM

## 2012-01-06 NOTE — Progress Notes (Signed)
Patient ID: SHAWNELL DYKES, female   DOB: 09/24/1951, 61 y.o.   MRN: [id number] Patient feeling better with less pain. Her MRI today looks much better with good evacuation of the large majority of her epidural abscess. Her large psoas abscess needs to be evacuated as soon as reasonable, and plan is for tomorrow AM I believe. Neurologically she is stable. Drain working well, and will leave in place for now.

## 2012-01-06 NOTE — Consult Note (Signed)
                                                        Ophthalmology-Retina Service  Post Intravitreal and orbital sub-conjunctival injection of Ceftazidime and Vancomycin x 2. Last dose 01/05/2012 mid-afternoon.  Improved motility OS with less proptosis and facial edema. Improved facial movement left side.   No light perception OS so being conservative regarding more aggressive intervention despite total retinal detachment.  Following along with you. Dr. Randon Goldsmith will follow over the weekend.  She reports that a drain will be placed in the region of the abscess  related to persistent fluid collection present. She is in much less pain.  [name], MD

## 2012-01-06 NOTE — H&P (Addendum)
Donna Tapia is an 61 y.o. female.   Pre Op H and P  Procedure:  Evisceration OS.  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   Past Medical History  Diagnosis Date  . Hepatitis B     Past Surgical History  Procedure Date  . Abdominal hysterectomy     Family History  Problem Relation Age of Onset  . Coronary artery disease Mother   . Parkinsonism Mother    Social History:  reports that she has never smoked. She does not have any smokeless tobacco history on file. She reports that she does not drink alcohol. Her drug history not on file.  Allergies:  Allergies  Allergen Reactions  . Sulfur     Medications Prior to Admission  Medication Dose Route Frequency Provider Last Rate Last Dose  . 0.9 %  sodium chloride infusion   Intravenous Continuous [name], MD 75 mL/hr at 01/04/12 1436    . acetaminophen (TYLENOL) tablet 650 mg  650 mg Oral Q6H PRN [name], MD   650 mg at 12/31/11 2234   Or  . acetaminophen (TYLENOL) suppository 650 mg  650 mg Rectal Q6H PRN [name], MD      . acetaminophen (TYLENOL) tablet 650 mg  650 mg Oral Once [name], MD   650 mg at 12/30/11 1559  . apraclonidine (IOPIDINE) 1 % ophthalmic solution 1 drop  1 drop Left Eye To Minor [name], MD   1 drop at 12/30/11 1534  . bacitracin 16109 UNITS injection           . bacitracin-polymyxin b (POLYSPORIN) ophthalmic ointment   Left Eye QID Antony Contras, MD      . ceFAZolin (ANCEF) 1-5 GM-% IVPB           . cefTAZidime (FORTAZ) 1 g in dextrose 5 % 50 mL IVPB  1 g Intravenous Q8H [name], MD   1 g at 01/06/12 0534  . cefTAZidime (FORTAZ) ophth injection 100 mg  100 mg Subconjunctival Once [name], MD      . cefTAZidime Elita Quick) ophth injection 100 mg  100 mg Subconjunctival Once [name], MD   100 mg at 01/05/12 1443  . cefTAZidime (FORTAZ) ophth injection 2.25 mg  2.25 mg Intravitreal Once  [name], MD      . cefTAZidime Elita Quick) ophth injection 2.25 mg  2.25 mg Intravitreal Once [name], MD   2.25 mg at 01/05/12 1444  . diazepam (VALIUM) injection 10 mg  10 mg Intravenous Once Reinaldo Meeker, MD   10 mg at 01/05/12 1000  . enoxaparin (LOVENOX) injection 40 mg  40 mg Subcutaneous Q24H Marlin Canary, DO      . feeding supplement (ENSURE CLINICAL STRENGTH) liquid 237 mL  237 mL Oral BID [name], RD   237 mL at 01/06/12 1055  . fentaNYL (SUBLIMAZE) injection 12.5 mcg  12.5 mcg Intravenous Once Maren Reamer, NP   12.5 mcg at 01/05/12 0650  . fentaNYL (SUBLIMAZE) injection 50 mcg  50 mcg Intravenous Once [name], MD   50 mcg at 12/30/11 1639  . fentaNYL (SUBLIMAZE) injection 50 mcg  50 mcg Intravenous Once [name], MD   50 mcg at 12/30/11 2040  . fluconazole (DIFLUCAN) IVPB 400 mg  400 mg Intravenous Q24H [name], MD   400 mg at 01/05/12 1735  . fluconazole (DIFLUCAN) IVPB 800 mg  800 mg  Intravenous Once [name], MD   800 mg at 01/03/12 2039  . fluorescein ophthalmic strip 1 strip  1 strip Both Eyes Once [name], MD   1 strip at 12/30/11 1500  . gadobenate dimeglumine (MULTIHANCE) injection 13 mL  13 mL Intravenous Once PRN Medication Radiologist, MD   13 mL at 01/02/12 1646  . gadobenate dimeglumine (MULTIHANCE) injection 13 mL  13 mL Intravenous Once Medication Radiologist, MD   13 mL at 01/04/12 1345  . gadobenate dimeglumine (MULTIHANCE) injection 14 mL  14 mL Intravenous Once PRN Medication Radiologist, MD   14 mL at 01/06/12 0900  . gatifloxacin (ZYMAXID) 0.5 % ophthalmic drops 1 drop  1 drop Left Eye Q2H while awake Antony Contras, MD   1 drop at 01/06/12 1257  . HYDROcodone-acetaminophen (NORCO) 10-325 MG per tablet 1 tablet  1 tablet Oral Q4H PRN [name], MD   1 tablet at 01/05/12 1130  . HYDROmorphone (DILAUDID) 1 MG/ML injection           . HYDROmorphone (DILAUDID) 1 MG/ML injection           . HYDROmorphone  (DILAUDID) injection 0.5 mg  0.5 mg Intravenous Q2H PRN Marlin Canary, DO   0.5 mg at 01/06/12 1055  . iohexol (OMNIPAQUE) 300 MG/ML solution 100 mL  100 mL Intravenous Once PRN Medication Radiologist, MD   100 mL at 01/05/12 2019  . iohexol (OMNIPAQUE) 300 MG/ML solution 20 mL  20 mL Oral Q1 Hr x 2 Medication Radiologist, MD   20 mL at 12/30/11 1848  . iohexol (OMNIPAQUE) 300 MG/ML solution 20 mL  20 mL Oral Q1 Hr x 2 Medication Radiologist, MD   20 mL at 01/05/12 1445  . iohexol (OMNIPAQUE) 300 MG/ML solution 70 mL  70 mL Intravenous Once PRN Medication Radiologist, MD   70 mL at 12/31/11 1730  . iohexol (OMNIPAQUE) 300 MG/ML solution 75 mL  75 mL Intravenous Once PRN Medication Radiologist, MD   75 mL at 12/30/11 1856  . metoCLOPramide (REGLAN) injection 10 mg  10 mg Intravenous Once PRN Constance Goltz, MD      . ondansetron [facility]) injection 4 mg  4 mg Intravenous Once [name], MD   4 mg at 12/30/11 1429  . ondansetron (ZOFRAN) tablet 4 mg  4 mg Oral Q6H PRN [name], MD   4 mg at 01/02/12 2034   Or  . ondansetron [facility]) injection 4 mg  4 mg Intravenous Q6H PRN [name], MD   4 mg at 01/05/12 1736  . pantoprazole (PROTONIX) EC tablet 40 mg  40 mg Oral BID AC Sandrea Hughs, MD   40 mg at 01/06/12 1055  . potassium chloride SA (K-DUR,KLOR-CON) CR tablet 40 mEq  40 mEq Oral Once [name], MD   40 mEq at 01/01/12 1001  . potassium chloride SA (K-DUR,KLOR-CON) CR tablet 40 mEq  40 mEq Oral Once [name], MD   40 mEq at 01/03/12 1851  . sodium chloride 0.9 % bolus 500 mL  500 mL Intravenous Once [name], MD   500 mL at 12/30/11 1429  . sodium chloride 0.9 % infusion           . sodium chloride 0.9 % injection 3 mL  3 mL Intravenous Q12H [name], MD   3 mL at 01/06/12 0946  . tetracaine (PONTOCAINE) 0.5 % ophthalmic solution 2 drop  2 drop Both Eyes Once [name], MD   2  drop at 12/30/11 1502  . timolol (TIMOPTIC) 0.5  % ophthalmic solution 1 drop  1 drop Left Eye Once [name], MD   1 drop at 12/30/11 1600  . tuberculin injection 5 Units  5 Units Intradermal Once Antony Contras, MD   5 Units at 12/30/11 2044  . valACYclovir (VALTREX) tablet 2,000 mg  2,000 mg Oral BID [name], MD   2,000 mg at 12/31/11 2235  . vancomycin (VANCOCIN) 750 mg in sodium chloride 0.9 % 150 mL IVPB  750 mg Intravenous Q8H Jessica Vann, DO   750 mg at 01/06/12 0952  . vancomycin (VANCOCIN) injection INJ 1 mg  1 mg Subconjunctival Once [name], MD   1 mg at 01/05/12 1446  . vancomycin (VANCOCIN) ophth injection 0.1 mg  0.1 mg Intravitreal Once [name], MD      . vancomycin [facility]) ophth injection 25 mg  25 mg Subconjunctival Once [name], MD      . vancomycin [facility]) ophth injection 25 mg  25 mg Subconjunctival Once [name], MD   25 mg at 01/05/12 1446  . white petrolatum (VASELINE) gel        1 application at 01/02/12 0945  . DISCONTD: 0.9 % irrigation (POUR BTL)    PRN Reinaldo Meeker, MD   1,000 mL at 01/04/12 2134  . DISCONTD: 50,000 units bacitracin in 0.9% normal saline 250 mL irrigation    PRN Reinaldo Meeker, MD      . DISCONTD: acyclovir ointment (ZOVIRAX) 5 %   Topical Q3H [name], MD      . DISCONTD: bacitracin-polymyxin b (POLYSPORIN) ophthalmic ointment   Left Eye BID [name], MD      . DISCONTD: ceFEPIme (MAXIPIME) 2 g in dextrose 5 % 50 mL IVPB  2 g Intravenous Q8H [name], MD      . DISCONTD: ceFEPIme (MAXIPIME) 2 g in dextrose 5 % 50 mL IVPB  2 g Intravenous Q8H [name], MD      . DISCONTD: fentaNYL (SUBLIMAZE) injection 25 mcg  25 mcg Intravenous Q3H PRN [name], MD   25 mcg at 01/05/12 0500  . DISCONTD: hemostatic agents    PRN Reinaldo Meeker, MD   1 application at 01/04/12 2134  . DISCONTD: hemostatic agents    PRN Reinaldo Meeker, MD   1 application at 01/04/12 2205  . DISCONTD: HYDROmorphone (DILAUDID) injection 0.25-0.5 mg  0.25-0.5  mg Intravenous Q5 min PRN Constance Goltz, MD   0.5 mg at 01/04/12 2341  . DISCONTD: iohexol (OMNIPAQUE) 300 MG/ML solution 40 mL  40 mL Intravenous Once PRN Medication Radiologist, MD      . DISCONTD: methylPREDNISolone sodium succinate (SOLU-MEDROL) 125 MG injection 80 mg  80 mg Intravenous Q12H Sandrea Hughs, MD   80 mg at 01/03/12 1217  . DISCONTD: moxifloxacin (AVELOX) IVPB 400 mg  400 mg Intravenous Q24H [name], MD   400 mg at 01/03/12 0045  . DISCONTD: prednisoLONE acetate (PRED FORTE) 1 % ophthalmic suspension 1 drop  1 drop Left Eye Q1H while awake Antony Contras, MD   1 drop at 01/02/12 1233  . DISCONTD: scopolamine (HYOSCINE) 0.25 % ophthalmic solution 1 drop  1 drop Left Eye BID Antony Contras, MD   1 drop at 01/02/12 1143  . DISCONTD: thrombin spray    PRN Reinaldo Meeker, MD   5,000 Units at 01/04/12 2134  . DISCONTD: vancomycin (VANCOCIN) 750 mg in sodium  chloride 0.9 % 150 mL IVPB  750 mg Intravenous Q12H [name], MD   750 mg at 01/02/12 1708  . DISCONTD: vancomycin (VANCOCIN) 750 mg in sodium chloride 0.9 % 150 mL IVPB  750 mg Intravenous Q12H [name], MD   750 mg at 01/05/12 1735   No current outpatient prescriptions on file as of 01/06/2012.    Results for orders placed during the hospital encounter of 12/30/11 (from the past 48 hour(s))  MRSA PCR SCREENING     Status: Normal   Collection Time   01/04/12  4:20 PM      Component Value Range Comment   MRSA by PCR NEGATIVE  NEGATIVE    TYPE AND SCREEN     Status: Normal   Collection Time   01/04/12  6:22 PM      Component Value Range Comment   ABO/RH(D) O POS      Antibody Screen NEG      Sample Expiration 01/07/2012      Unit Number [id number]      Blood Component Type RED CELLS,LR      Unit division 00      Status of Unit ISSUED,FINAL      Transfusion Status OK TO TRANSFUSE      Crossmatch Result Compatible      Unit Number [id number]      Blood Component Type RED CELLS,LR      Unit  division 00      Status of Unit ISSUED,FINAL      Transfusion Status OK TO TRANSFUSE      Crossmatch Result Compatible     PREPARE RBC (CROSSMATCH)     Status: Normal   Collection Time   01/04/12  6:22 PM      Component Value Range Comment   Order Confirmation ORDER PROCESSED BY BLOOD BANK     CULTURE, ROUTINE-ABSCESS     Status: Normal (Preliminary result)   Collection Time   01/04/12  9:46 PM      Component Value Range Comment   Specimen Description ABSCESS BACK      Special Requests PATIENT ON FOLLOWING LORTAZ, DIFLUCAN EPIDURAL      Gram Stain PENDING      Culture NO GROWTH 1 DAY      Report Status PENDING     ANAEROBIC CULTURE     Status: Normal (Preliminary result)   Collection Time   01/04/12  9:46 PM      Component Value Range Comment   Specimen Description ABSCESS BACK      Special Requests PATIENT ON FOLLOWING LORTAZ, DIFLUCAN EPIDURAL      Gram Stain        Value: ABUNDANT WBC PRESENT,BOTH PMN AND MONONUCLEAR     NO ORGANISMS SEEN     Performed at [facility]   Culture        Value: NO ANAEROBES ISOLATED; CULTURE IN PROGRESS FOR 5 DAYS   Report Status PENDING     GRAM STAIN     Status: Normal   Collection Time   01/04/12  9:46 PM      Component Value Range Comment   Specimen Description ABSCESS BACK      Special Requests PATIENT ON FOLLOWING LORTAZ, DIFLUCAN EPIDURAL      Gram Stain        Value: ABUNDANT WBC PRESENT,BOTH PMN AND MONONUCLEAR     NO ORGANISMS SEEN   Report Status 01/04/2012 FINAL     CBC  Status: Abnormal   Collection Time   01/05/12  4:47 AM      Component Value Range Comment   WBC 18.5 (*) 4.0 - 10.5 (K/uL)    RBC 3.53 (*) 3.87 - 5.11 (MIL/uL)    Hemoglobin 10.9 (*) 12.0 - 15.0 (g/dL)    HCT 16.1 (*) 09.6 - 46.0 (%)    MCV 92.4  78.0 - 100.0 (fL)    MCH 30.9  26.0 - 34.0 (pg)    MCHC 33.4  30.0 - 36.0 (g/dL)    RDW 04.5 (*) 40.9 - 15.5 (%)    Platelets 665 (*) 150 - 400 (K/uL)   BASIC METABOLIC PANEL     Status: Abnormal    Collection Time   01/05/12  4:47 AM      Component Value Range Comment   Sodium 135  135 - 145 (mEq/L)    Potassium 4.4  3.5 - 5.1 (mEq/L)    Chloride 105  96 - 112 (mEq/L)    CO2 24  19 - 32 (mEq/L)    Glucose, Bld 74  70 - 99 (mg/dL)    BUN 9  6 - 23 (mg/dL)    Creatinine, Ser 8.11  0.50 - 1.10 (mg/dL)    Calcium 8.0 (*) 8.4 - 10.5 (mg/dL)    GFR calc non Af Amer >90  >90 (mL/min)    GFR calc Af Amer >90  >90 (mL/min)   VANCOMYCIN, TROUGH     Status: Abnormal   Collection Time   01/05/12  5:27 PM      Component Value Range Comment   Vancomycin Tr 8.3 (*) 10.0 - 20.0 (ug/mL)   CBC     Status: Abnormal   Collection Time   01/06/12  5:31 AM      Component Value Range Comment   WBC 18.5 (*) 4.0 - 10.5 (K/uL)    RBC 3.53 (*) 3.87 - 5.11 (MIL/uL)    Hemoglobin 10.9 (*) 12.0 - 15.0 (g/dL)    HCT 91.4 (*) 78.2 - 46.0 (%)    MCV 91.2  78.0 - 100.0 (fL)    MCH 30.9  26.0 - 34.0 (pg)    MCHC 33.9  30.0 - 36.0 (g/dL)    RDW 95.6 (*) 21.3 - 15.5 (%)    Platelets 682 (*) 150 - 400 (K/uL)   BASIC METABOLIC PANEL     Status: Abnormal   Collection Time   01/06/12  5:31 AM      Component Value Range Comment   Sodium 132 (*) 135 - 145 (mEq/L)    Potassium 3.7  3.5 - 5.1 (mEq/L)    Chloride 97  96 - 112 (mEq/L)    CO2 25  19 - 32 (mEq/L)    Glucose, Bld 70  70 - 99 (mg/dL)    BUN 9  6 - 23 (mg/dL)    Creatinine, Ser 0.86  0.50 - 1.10 (mg/dL)    Calcium 7.5 (*) 8.4 - 10.5 (mg/dL)    GFR calc non Af Amer >90  >90 (mL/min)    GFR calc Af Amer >90  >90 (mL/min)      Review of Systems  Constitutional: Positive for fever.  Eyes: Positive for pain.       Left eye infection  Cardiovascular: Negative for chest pain.  Gastrointestinal: Negative for nausea and vomiting.  Neurological: Negative for headaches.    Blood pressure 143/65, pulse 85, temperature 99.4 F (37.4 C), temperature source Oral, resp. rate 18, height  5\' 9"  (1.753 m), weight 140 lb 6.9 oz (63.7 kg), SpO2 94.00%. Physical Exam    Constitutional: She is oriented to person, place, and time.  Cardiovascular: Normal rate, regular rhythm and normal heart sounds.   No murmur heard. Respiratory: Effort normal. She has no wheezes.  GI: Soft. There is no tenderness.  Neurological: She is alert and oriented to person, place, and time.     Assessment/Plan Endophthalmitis/panophthalmitis OS.  Plan:  Evisceration OS.   Pt aware of procedure benefits and risks and agreeable to proceed. Consent signed and in chart  Signed Antony Contras  [id number]

## 2012-01-06 NOTE — Progress Notes (Deleted)
PT Cancellation Note  Treatment cancelled today due to patient pending surgery in the morning for epidural abscess drainage. Will follow-up following the procedure. Please update activity orders as appropriate if there are any changes.  Thanks!  Romeo Rabon 01/06/2012, 3:04 PM

## 2012-01-06 NOTE — Progress Notes (Signed)
PT Cancellation Note  Treatment cancelled today due to patient pending surgery in the morning for epidural abscess drainage. Will follow-up following the procedure. Please update activity orders as appropriate if there are any changes.  Thanks!  Romeo Rabon 01/06/2012, 3:06 PM

## 2012-01-07 ENCOUNTER — Inpatient Hospital Stay (HOSPITAL_COMMUNITY): Payer: Medicaid Other

## 2012-01-07 ENCOUNTER — Inpatient Hospital Stay (HOSPITAL_COMMUNITY): Payer: Medicaid Other | Admitting: Anesthesiology

## 2012-01-07 ENCOUNTER — Encounter (HOSPITAL_COMMUNITY)
Admission: EM | Disposition: A | Discharge status: Home Health | Payer: Self-pay | Source: Home / Self Care | Attending: Internal Medicine

## 2012-01-07 ENCOUNTER — Encounter (HOSPITAL_COMMUNITY): Payer: Self-pay | Admitting: Anesthesiology

## 2012-01-07 DIAGNOSIS — J189 Pneumonia, unspecified organism: Secondary | ICD-10-CM

## 2012-01-07 HISTORY — PX: EVISCERATION: SHX1539

## 2012-01-07 LAB — GRAM STAIN

## 2012-01-07 LAB — CBC
HCT: 32 % — ABNORMAL LOW (ref 36.0–46.0)
MCV: 92.5 fL (ref 78.0–100.0)
Platelets: 708 10*3/uL — ABNORMAL HIGH (ref 150–400)
RBC: 3.46 MIL/uL — ABNORMAL LOW (ref 3.87–5.11)
WBC: 12.8 10*3/uL — ABNORMAL HIGH (ref 4.0–10.5)

## 2012-01-07 LAB — BASIC METABOLIC PANEL
CO2: 28 mEq/L (ref 19–32)
Chloride: 99 mEq/L (ref 96–112)
Creatinine, Ser: 0.52 mg/dL (ref 0.50–1.10)
Potassium: 2.9 mEq/L — ABNORMAL LOW (ref 3.5–5.1)

## 2012-01-07 SURGERY — REPAIR, EVISCERATION, ABDOMEN
Anesthesia: General | Site: Eye | Laterality: Left | Wound class: Dirty or Infected

## 2012-01-07 MED ORDER — BSS IO SOLN
INTRAOCULAR | Status: DC | PRN
  Administered 2012-01-07: 90 mL via INTRAOCULAR

## 2012-01-07 MED ORDER — ROCURONIUM BROMIDE 100 MG/10ML IV SOLN
INTRAVENOUS | Status: DC | PRN
  Administered 2012-01-07: 30 mg via INTRAVENOUS

## 2012-01-07 MED ORDER — ACETAMINOPHEN 10 MG/ML IV SOLN: 1000.0000 mg | Freq: Once | INTRAVENOUS | Status: DC | PRN

## 2012-01-07 MED ORDER — FENTANYL CITRATE 0.05 MG/ML IJ SOLN
INTRAMUSCULAR | Status: AC | PRN
  Administered 2012-01-07 (×4): 25 ug via INTRAVENOUS

## 2012-01-07 MED ORDER — MIDAZOLAM HCL 5 MG/5ML IJ SOLN
INTRAMUSCULAR | Status: AC | PRN
  Administered 2012-01-07 (×4): 1 mg via INTRAVENOUS

## 2012-01-07 MED ORDER — PROPOFOL 10 MG/ML IV EMUL
INTRAVENOUS | Status: DC | PRN
  Administered 2012-01-07: 100 mg via INTRAVENOUS

## 2012-01-07 MED ORDER — LIDOCAINE HCL 4 % MT SOLN
OROMUCOSAL | Status: DC | PRN
  Administered 2012-01-07: 4 mL via TOPICAL

## 2012-01-07 MED ORDER — SUCCINYLCHOLINE CHLORIDE 20 MG/ML IJ SOLN
INTRAMUSCULAR | Status: DC | PRN
  Administered 2012-01-07: 100 mg via INTRAVENOUS

## 2012-01-07 MED ORDER — HYDROMORPHONE HCL PF 1 MG/ML IJ SOLN: 0.2500 mg | INTRAMUSCULAR | Status: DC | PRN

## 2012-01-07 MED ORDER — NEOSTIGMINE METHYLSULFATE 1 MG/ML IJ SOLN
INTRAMUSCULAR | Status: DC | PRN
  Administered 2012-01-07: 3 mg via INTRAVENOUS

## 2012-01-07 MED ORDER — POTASSIUM CHLORIDE 10 MEQ/100ML IV SOLN
10.0000 meq | INTRAVENOUS | Status: AC
  Administered 2012-01-07 – 2012-01-08 (×2): 10 meq via INTRAVENOUS
  Filled 2012-01-07 (×2): qty 100

## 2012-01-07 MED ORDER — MIDAZOLAM HCL 2 MG/2ML IJ SOLN
INTRAMUSCULAR | Status: AC
  Filled 2012-01-07: qty 6

## 2012-01-07 MED ORDER — ISOPROPYL ALCOHOL 70 % SOLN
Status: DC | PRN
  Administered 2012-01-07: 1 via TOPICAL

## 2012-01-07 MED ORDER — FENTANYL CITRATE 0.05 MG/ML IJ SOLN
INTRAMUSCULAR | Status: AC
  Filled 2012-01-07: qty 6

## 2012-01-07 MED ORDER — GADOBENATE DIMEGLUMINE 529 MG/ML IV SOLN
5.0000 mL | Freq: Once | INTRAVENOUS | Status: AC
  Administered 2012-01-07: 5 mL via INTRAVENOUS

## 2012-01-07 MED ORDER — BACITRACIN ZINC 500 UNIT/GM EX OINT
TOPICAL_OINTMENT | CUTANEOUS | Status: DC | PRN
  Administered 2012-01-07: 1 via TOPICAL

## 2012-01-07 MED ORDER — FENTANYL CITRATE 0.05 MG/ML IJ SOLN
INTRAMUSCULAR | Status: DC | PRN
  Administered 2012-01-07: 50 ug via INTRAVENOUS
  Administered 2012-01-07: 100 ug via INTRAVENOUS

## 2012-01-07 MED ORDER — CEFAZOLIN SODIUM 1 G IJ SOLR
INTRAMUSCULAR | Status: DC | PRN
  Administered 2012-01-07: 250 mg via INTRAMUSCULAR

## 2012-01-07 MED ORDER — PHENYLEPHRINE HCL 10 MG/ML IJ SOLN
INTRAMUSCULAR | Status: DC | PRN
  Administered 2012-01-07 (×4): 80 ug via INTRAVENOUS

## 2012-01-07 MED ORDER — LACTATED RINGERS IV SOLN
INTRAVENOUS | Status: DC | PRN
  Administered 2012-01-07 (×2): via INTRAVENOUS

## 2012-01-07 MED ORDER — POVIDONE-IODINE 5 % OP SOLN
OPHTHALMIC | Status: DC | PRN
  Administered 2012-01-07: 1 via OPHTHALMIC

## 2012-01-07 MED ORDER — ONDANSETRON HCL 4 MG/2ML IJ SOLN: 4.0000 mg | Freq: Once | INTRAMUSCULAR | Status: DC | PRN

## 2012-01-07 MED ORDER — MORPHINE SULFATE 2 MG/ML IJ SOLN
2.0000 mg | Freq: Once | INTRAMUSCULAR | Status: AC
  Administered 2012-01-07: 2 mg via INTRAVENOUS

## 2012-01-07 MED ORDER — CEFAZOLIN SUBCONJUNCTIVAL INJECTION 100 MG/0.5 ML
1.0000 mL | INJECTION | Freq: Once | SUBCONJUNCTIVAL | Status: DC
  Filled 2012-01-07: qty 1

## 2012-01-07 MED ORDER — GLYCOPYRROLATE 0.2 MG/ML IJ SOLN
INTRAMUSCULAR | Status: DC | PRN
  Administered 2012-01-07: .4 mg via INTRAVENOUS

## 2012-01-07 MED ORDER — BACITRACIN-POLYMYXIN B 500-10000 UNIT/GM OP OINT
TOPICAL_OINTMENT | OPHTHALMIC | Status: DC | PRN
  Administered 2012-01-07 (×2): 1 via OPHTHALMIC

## 2012-01-07 MED ORDER — ONDANSETRON HCL 4 MG/2ML IJ SOLN
INTRAMUSCULAR | Status: DC | PRN
  Administered 2012-01-07: 4 mg via INTRAVENOUS

## 2012-01-07 MED ORDER — POTASSIUM CHLORIDE 10 MEQ/100ML IV SOLN
10.0000 meq | INTRAVENOUS | Status: AC
  Administered 2012-01-07 (×3): 10 meq via INTRAVENOUS
  Filled 2012-01-07 (×5): qty 100

## 2012-01-07 MED ORDER — EPINEPHRINE HCL 1 MG/ML IJ SOLN
INTRAMUSCULAR | Status: DC | PRN
  Administered 2012-01-07: 1 mg

## 2012-01-07 MED ORDER — CEFAZOLIN SUBCONJUNCTIVAL INJECTION 100 MG/0.5 ML
200.0000 mg | INJECTION | Freq: Once | SUBCONJUNCTIVAL | Status: DC
  Filled 2012-01-07: qty 1

## 2012-01-07 SURGICAL SUPPLY — 63 items
APL SRG 3 HI ABS STRL LF PLS (MISCELLANEOUS) ×7
APPLICATOR COTTON TIP 6IN STRL (MISCELLANEOUS) ×1 IMPLANT
APPLICATOR DR MATTHEWS STRL (MISCELLANEOUS) ×11 IMPLANT
BANDAGE GAUZE ELAST BULKY 4 IN (GAUZE/BANDAGES/DRESSINGS) ×1 IMPLANT
BNDG ADH 5X2 AIR PERM ELC (GAUZE/BANDAGES/DRESSINGS)
BNDG COHESIVE 2X5 WHT NS (GAUZE/BANDAGES/DRESSINGS) ×1 IMPLANT
CANISTER SUCTION 2500CC (MISCELLANEOUS) ×1 IMPLANT
CATH FOLEY 2WAY SLVR  5CC 12FR (CATHETERS) ×1
CATH FOLEY 2WAY SLVR 5CC 12FR (CATHETERS) IMPLANT
CATH SUCT 10FR WHISTLE TIP (CATHETERS) ×1 IMPLANT
CAUTERY EYE LOW TEMP 1300F FIN (OPHTHALMIC RELATED) ×1 IMPLANT
CLOTH BEACON ORANGE TIMEOUT ST (SAFETY) ×2 IMPLANT
CONFORMERS SILICONE 5649 (OPHTHALMIC RELATED) ×1 IMPLANT
CORDS BIPOLAR (ELECTRODE) ×1 IMPLANT
COVER SURGICAL LIGHT HANDLE (MISCELLANEOUS) ×3 IMPLANT
DRAPE OPHTHALMIC 77X100 STRL (CUSTOM PROCEDURE TRAY) ×2 IMPLANT
EYE SHIELD UNIVERSAL CLEAR (GAUZE/BANDAGES/DRESSINGS) ×1 IMPLANT
GAUZE PACKING IODOFORM 1/4X5 (PACKING) ×1 IMPLANT
GLOVE BIOGEL PI IND STRL 7.0 (GLOVE) IMPLANT
GLOVE BIOGEL PI INDICATOR 7.0 (GLOVE) ×1
GLOVE ECLIPSE 6.5 STRL STRAW (GLOVE) ×1 IMPLANT
GLOVE SS BIOGEL STRL SZ 7 (GLOVE) IMPLANT
GLOVE SS BIOGEL STRL SZ 7.5 (GLOVE) IMPLANT
GLOVE SS BIOGEL STRL SZ 8 (GLOVE) ×1 IMPLANT
GLOVE SUPERSENSE BIOGEL SZ 7 (GLOVE) ×1
GLOVE SUPERSENSE BIOGEL SZ 7.5 (GLOVE) ×3
GLOVE SUPERSENSE BIOGEL SZ 8 (GLOVE) ×2
GOWN EXTRA PROTECTION XL (GOWNS) ×3 IMPLANT
GOWN STRL NON-REIN LRG LVL3 (GOWN DISPOSABLE) ×1 IMPLANT
GOWN STRL REIN XL XLG (GOWN DISPOSABLE) ×1 IMPLANT
HEMOSTAT SURGICEL 2X14 (HEMOSTASIS) IMPLANT
KIT ROOM TURNOVER OR (KITS) ×2 IMPLANT
MARKER SKIN DUAL TIP RULER LAB (MISCELLANEOUS) ×2 IMPLANT
NDL 18GX1X1/2 (RX/OR ONLY) (NEEDLE) ×1 IMPLANT
NDL HYPO 18GX1.5 BLUNT FILL (NEEDLE) IMPLANT
NEEDLE 18GX1X1/2 (RX/OR ONLY) (NEEDLE) IMPLANT
NEEDLE HYPO 18GX1.5 BLUNT FILL (NEEDLE) ×2 IMPLANT
NS IRRIG 1000ML POUR BTL (IV SOLUTION) ×2 IMPLANT
PACK CATARACT CUSTOM (CUSTOM PROCEDURE TRAY) ×2 IMPLANT
PAD ARMBOARD 7.5X6 YLW CONV (MISCELLANEOUS) ×3 IMPLANT
PAD EYE OVAL STERILE LF (GAUZE/BANDAGES/DRESSINGS) ×1 IMPLANT
PROTECTOR CORNEAL (OPHTHALMIC RELATED) ×2 IMPLANT
SPECIMEN JAR SMALL (MISCELLANEOUS) ×2 IMPLANT
STOCKINETTE IMPERVIOUS 9X36 MD (GAUZE/BANDAGES/DRESSINGS) ×2 IMPLANT
SUCTION FRAZIER TIP 10 FR DISP (SUCTIONS) ×2 IMPLANT
SUT CHROMIC 5 0 P 3 (SUTURE) IMPLANT
SUT MERSILENE 5 0 RD 1 DA (SUTURE) IMPLANT
SUT PROLENE 6 0 C 1 24 (SUTURE) IMPLANT
SUT SILK 2 0 (SUTURE)
SUT SILK 2-0 18XBRD TIE 12 (SUTURE) IMPLANT
SUT SILK 6 0 G 6 (SUTURE) ×1 IMPLANT
SUT VIC AB 4-0 P-3 18X BRD (SUTURE) IMPLANT
SUT VIC AB 4-0 P3 18 (SUTURE)
SUT VIC AB 5-0 DAS24 8 (SUTURE) IMPLANT
SUT VICRYL 7 0 TG140 8 (SUTURE) IMPLANT
SWAB CULTURE LIQUID MINI MALE (MISCELLANEOUS) ×1 IMPLANT
SYR 20CC LL (SYRINGE) ×1 IMPLANT
TAPE SURG TRANSPORE 1 IN (GAUZE/BANDAGES/DRESSINGS) IMPLANT
TAPE SURGICAL TRANSPORE 1 IN (GAUZE/BANDAGES/DRESSINGS) ×1
TOWEL OR 17X24 6PK STRL BLUE (TOWEL DISPOSABLE) ×4 IMPLANT
TUBE ANAEROBIC SPECIMEN COL (MISCELLANEOUS) ×1 IMPLANT
TUBE CONNECTING 12X1/4 (SUCTIONS) ×2 IMPLANT
WATER STERILE IRR 1000ML POUR (IV SOLUTION) ×1 IMPLANT

## 2012-01-07 NOTE — Op Note (Signed)
Preoperative Diagnosis:  Endophthalmitis / panophthalmitis OS.   Postoperative Diagnosis:  Same.   Procedure performed:  Evisceration OS.   Anesth:  GETA  Complications:  None.   EBL:  5cc  Specimens:  (1) cornea and lens Left eye  (2) intraocular contents.  (3) purulent intraocular contents for gram stain and culture.    Procedure:  Please see ophthalmology prior progress notes.  Donna Tapia' left eye has had no light perception vision, displayed marked inflammation and infection on clinical exam and MRI, has led to increasing orbital involvement and infection and has had no realistic expectation to be salvaged.  This was explained in detail to the patient, after confirming that she was fully alert, completely oriented and of sound mind and clear mentation.  We discussed the indications and reasoning for the procedure in detail in her room 5509 and witnessed by her R.N.; we repeated the discussion in the preop holding area being witnessed by CRNA and OR RN.  The left brow was marked and she was taken to the OR in stable condition.  GETA was administered and the head of the bed was rotated 90 degrees.  The left eye was then prepped (taking extra time to copiously irrigate the ocular surface with betadine) and draped in standard ophthalmic fashion.  An eyelid speculum was placed beneath the lids.    A 360 degree conjunctival peritomy was created with [place].  A 30 degree blade was used to create a limbal clear corneal incision; there was immediate egress of markedly cloudy aqueous with some frank purulence as well.  Scissors to the left were used to create a limbal incision and with ~3-4 clock hours of incision there was immediate expulsion of intraocular contents including but not limited to the crystalline lens, vitreous and frank purulent material.  The purulent material was captured for microbiology studies.  The lens and vitreous were placed aside for pathology.  The limbal corneal incision  was carried 360 degrees with scissors to the left and scissors to the right.  A spatula was used to enter the suprachoroidal space and an evisceration spoon was used to carefully hug the scleral wall and remove the intraocular contents.  Bipolar cautery was used for hemostasis of the CRA and vortex vessels.  Cotton swabs soaked in 70% ethanol were used to meticulously scrub the scleral shell.  This was then copiously irrigated from the sclera and ocular surface with BSS.  1/4" idodoform gauze was copiously coated in Poly/Bac ointment.  This was then used to pack the scleral shell.  The lateral canthal angle was loosely re approximated with a 6-0 vicryl suture.  A sterile catheter tubing was used along with a double armed 6-0 silk to place a temporal tarsorrhaphy which was only placed on loose tension.  A large conformer was coated in poly/bac ointment and placed into the fornices.  The idoform gauze tip was brought through the central opening of the conformer.    The drapes were removed.  The operative area was cleaned and then dried.  Copious poly/bac ointment was used to coat the exposed conjunctiva and lids.  2 eye pads were secured with paper tape.  An eyeshield was secured with paper tape.  The head of the bed was rotated back to anesthesia and the patient was successfully awakened and extubated and taken to PACU in stable condition having tolerated the procedure well.    Dispo:  Leave shield and patch on overnight.  Reg diet; resume prior floor  orders.

## 2012-01-07 NOTE — Transfer of Care (Signed)
Immediate Anesthesia Transfer of Care Note  Patient: Donna Tapia  Procedure(s) Performed: Procedure(s) (LRB): EVISCERATION REPAIR (Left)  Patient Location: PACU  Anesthesia Type: General  Level of Consciousness: awake and patient cooperative  Airway & Oxygen Therapy: Patient Spontanous Breathing and Patient connected to face mask oxygen  Post-op Assessment: Report given to PACU RN  Post vital signs: Reviewed and stable  Complications: No apparent anesthesia complications

## 2012-01-07 NOTE — Brief Op Note (Signed)
12/30/2011 - 01/07/2012  10:13 PM  PATIENT:  Donna Tapia  61 y.o. female  PRE-OPERATIVE DIAGNOSIS:  Endophthalmitis, panophthalmitis, left eye.  POST-OPERATIVE DIAGNOSIS:  Endophthalmitis, Panophthalmitis Left Eye  PROCEDURE:  Procedure(s) (LRB): EVISCERATION (Left) eye  SURGEON:  Surgeon(s) and Role:    * Antony Contras, MD - Primary  PHYSICIAN ASSISTANT: none  ASSISTANTS: none  ANESTHESIA:   general  EBL:  Total I/O In: 1000 [I.V.:1000] Out: -   BLOOD ADMINISTERED:none  DRAINS: none   LOCAL MEDICATIONS USED:  OTHER Subconj Ceftaz OS  SPECIMEN:  Source of Specimen:  Contents, left globe.  DISPOSITION OF SPECIMEN:  PATHOLOGY  COUNTS:  YES  TOURNIQUET:  * No tourniquets in log *  DICTATION: .Note written in EPIC  PLAN OF CARE: Admit to inpatient   PATIENT DISPOSITION:  PACU - hemodynamically stable.   Delay start of Pharmacological VTE agent (>24hrs) due to surgical blood loss or risk of bleeding: not applicable

## 2012-01-07 NOTE — Progress Notes (Signed)
INFECTIOUS DISEASE PROGRESS NOTE  ID: Donna Tapia is a 61 y.o. female with endophthalmitis, psoas abscess, negative cultures.    Subjective: Feels like her eye is hurting more.    Abtx:  Anti-infectives     Start     Dose/Rate Route Frequency Ordered Stop   01/06/12 0200   vancomycin (VANCOCIN) 750 mg in sodium chloride 0.9 % 150 mL IVPB        750 mg 150 mL/hr over 60 Minutes Intravenous Every 8 hours 01/05/12 1952     01/05/12 1300   cefTAZidime (FORTAZ) ophth injection 100 mg        100 mg Subconjunctival  Once 01/05/12 1247 01/05/12 1443   01/05/12 1300   vancomycin (VANCOCIN) ophth injection 25 mg        25 mg Subconjunctival  Once 01/05/12 1247 01/05/12 1446   01/05/12 1300   vancomycin (VANCOCIN) injection INJ 1 mg        1 mg Subconjunctival  Once 01/05/12 1247 01/05/12 1446   01/05/12 1200   cefTAZidime (FORTAZ) ophth injection 2.25 mg        2.25 mg Intravitreal  Once 01/05/12 1053 01/05/12 1444   01/04/12 2134   50,000 units bacitracin in 0.9% normal saline 250 mL irrigation  Status:  Discontinued          As needed 01/04/12 2145 01/04/12 2247   01/04/12 2100   bacitracin 16109 UNITS injection     Comments: AURAND, BRANDI: cabinet override         01/04/12 2100 01/05/12 0859   01/04/12 2100   ceFAZolin (ANCEF) 1-5 GM-% IVPB     Comments: AURAND, BRANDI: cabinet override         01/04/12 2100 01/05/12 0859   01/04/12 1830   fluconazole (DIFLUCAN) IVPB 400 mg        400 mg 200 mL/hr over 60 Minutes Intravenous Every 24 hours 01/03/12 1827     01/03/12 1930   fluconazole (DIFLUCAN) IVPB 800 mg        800 mg 400 mL/hr over 60 Minutes Intravenous  Once 01/03/12 1826 01/03/12 2139   01/03/12 1245   cefTAZidime (FORTAZ) ophth injection 2.25 mg        2.25 mg Intravitreal  Once 01/03/12 1135     01/03/12 1245   cefTAZidime (FORTAZ) ophth injection 100 mg        100 mg Subconjunctival  Once 01/03/12 1135     01/03/12 1200   vancomycin (VANCOCIN) ophth injection  0.1 mg        0.1 mg Intravitreal  Once 01/03/12 1130     01/03/12 1200   vancomycin (VANCOCIN) ophth injection 25 mg        25 mg Subconjunctival  Once 01/03/12 1131     01/03/12 0500   vancomycin (VANCOCIN) 750 mg in sodium chloride 0.9 % 150 mL IVPB  Status:  Discontinued        750 mg 150 mL/hr over 60 Minutes Intravenous Every 12 hours 01/02/12 1716 01/05/12 1953   01/03/12 0200   ceFEPIme (MAXIPIME) 2 g in dextrose 5 % 50 mL IVPB  Status:  Discontinued        2 g 100 mL/hr over 30 Minutes Intravenous Every 8 hours 01/02/12 1716 01/02/12 2041   01/02/12 2200   cefTAZidime (FORTAZ) 1 g in dextrose 5 % 50 mL IVPB        1 g 100 mL/hr over 30 Minutes Intravenous 3 times  per day 01/02/12 2106     01/02/12 1400   ceFEPIme (MAXIPIME) 2 g in dextrose 5 % 50 mL IVPB  Status:  Discontinued        2 g 100 mL/hr over 30 Minutes Intravenous 3 times per day 01/02/12 1318 01/02/12 1716   01/02/12 1400   vancomycin (VANCOCIN) 750 mg in sodium chloride 0.9 % 150 mL IVPB  Status:  Discontinued        750 mg 150 mL/hr over 60 Minutes Intravenous Every 12 hours 01/02/12 1350 01/02/12 1716   12/31/11 1530   valACYclovir (VALTREX) tablet 2,000 mg        2,000 mg Oral 2 times daily 12/31/11 1358 12/31/11 2235   12/31/11 0100   moxifloxacin (AVELOX) IVPB 400 mg  Status:  Discontinued        400 mg 250 mL/hr over 60 Minutes Intravenous Every 24 hours 12/31/11 0040 01/03/12 1913          Medications: I have reviewed the patient's current medications.  Objective: Vital signs in last 24 hours: Temp:  [98.3 F (36.8 C)-98.5 F (36.9 C)] 98.3 F (36.8 C) (03/08 0500) Pulse Rate:  [60-95] 93  (03/08 1551) Resp:  [17-25] 21  (03/08 1551) BP: (113-149)/(51-68) 113/54 mmHg (03/08 1551) SpO2:  [94 %-100 %] 98 % (03/08 1551) Weight:  [136 lb 14.5 oz (62.1 kg)] 136 lb 14.5 oz (62.1 kg) (03/08 0500)   General appearance: alert and no distress Eyes: left eye with more edema  Lab  Results  Basename 01/07/12 0642 01/06/12 0531  WBC 12.8* 18.5*  HGB 10.8* 10.9*  HCT 32.0* 32.2*  NA 134* 132*  K 2.9* 3.7  CL 99 97  CO2 28 25  BUN 4* 9  CREATININE 0.52 0.53  GLU -- --   Liver Panel No results found for this basename: PROT:2,ALBUMIN:2,AST:2,ALT:2,ALKPHOS:2,BILITOT:2,BILIDIR:2,IBILI:2 in the last 72 hours Sedimentation Rate No results found for this basename: ESRSEDRATE in the last 72 hours C-Reactive Protein No results found for this basename: CRP:2 in the last 72 hours  Microbiology: Recent Results (from the past 240 hour(s))  URINE CULTURE     Status: Normal   Collection Time   12/31/11  4:15 AM      Component Value Range Status Comment   Specimen Description URINE, CLEAN CATCH   Final    Special Requests NONE   Final    Culture  Setup Time [id number]   Final    Colony Count NO GROWTH   Final    Culture NO GROWTH   Final    Report Status 01/01/2012 FINAL   Final   CULTURE, BLOOD (ROUTINE X 2)     Status: Normal   Collection Time   12/31/11  8:42 AM      Component Value Range Status Comment   Specimen Description BLOOD LEFT ARM   Final    Special Requests BOTTLES DRAWN AEROBIC ONLY 10CC   Final    Culture  Setup Time [id number]   Final    Culture NO GROWTH 5 DAYS   Final    Report Status 01/06/2012 FINAL   Final   CULTURE, BLOOD (ROUTINE X 2)     Status: Normal   Collection Time   12/31/11  8:48 AM      Component Value Range Status Comment   Specimen Description BLOOD LEFT HAND   Final    Special Requests BOTTLES DRAWN AEROBIC ONLY 10CC   Final    Culture  Setup  Time [id number]   Final    Culture NO GROWTH 5 DAYS   Final    Report Status 01/06/2012 FINAL   Final   CULTURE, SPUTUM-ASSESSMENT     Status: Normal   Collection Time   12/31/11  2:15 PM      Component Value Range Status Comment   Specimen Description SPUTUM   Final    Special Requests NONE   Final    Sputum evaluation     Final    Value: THIS SPECIMEN IS ACCEPTABLE. RESPIRATORY  CULTURE REPORT TO FOLLOW.   Report Status 12/31/2011 FINAL   Final   CULTURE, RESPIRATORY     Status: Normal   Collection Time   12/31/11  2:15 PM      Component Value Range Status Comment   Specimen Description SPUTUM   Final    Special Requests NONE   Final    Gram Stain     Final    Value: NO WBC SEEN     NO SQUAMOUS EPITHELIAL CELLS SEEN     FEW YEAST   Culture NORMAL OROPHARYNGEAL FLORA   Final    Report Status 01/03/2012 FINAL   Final   STOOL CULTURE     Status: Normal   Collection Time   12/31/11  2:45 PM      Component Value Range Status Comment   Specimen Description STOOL   Final    Special Requests NONE   Final    Culture     Final    Value: NO SALMONELLA, SHIGELLA, CAMPYLOBACTER, OR YERSINIA ISOLATED   Report Status 01/04/2012 FINAL   Final   CLOSTRIDIUM DIFFICILE BY PCR     Status: Normal   Collection Time   01/02/12 11:29 AM      Component Value Range Status Comment   C difficile by pcr NEGATIVE  NEGATIVE  Final   EYE CULTURE     Status: Normal (Preliminary result)   Collection Time   01/03/12  1:45 PM      Component Value Range Status Comment   Specimen Description EYE LEFT   Final    Special Requests     Final    Value: VTREOUS PT ON VANC CEFTAZ DO SENS TO ALL GROWTH INCLUDING COAG NEG STAPH PER MD   Culture NO GROWTH 3 DAYS   Final    Report Status PENDING   Incomplete   EYE CULTURE     Status: Normal (Preliminary result)   Collection Time   01/03/12  1:45 PM      Component Value Range Status Comment   Specimen Description EYE LEFT   Final    Special Requests     Final    Value: ANTEROR CHAMBER PT ON VANC CEFTAZ DO SENS TO ALL GROWTH INCLUDING COAG NEG STAPH PER MD   Culture NO GROWTH 3 DAYS   Final    Report Status PENDING   Incomplete   CULTURE, BLOOD (ROUTINE X 2)     Status: Normal (Preliminary result)   Collection Time   01/03/12  8:32 PM      Component Value Range Status Comment   Specimen Description BLOOD RIGHT HAND   Final    Special Requests BOTTLES  DRAWN AEROBIC ONLY 10CC   Final    Culture  Setup Time [id number]   Final    Culture     Final    Value:        BLOOD CULTURE RECEIVED NO GROWTH TO DATE  CULTURE WILL BE HELD FOR 5 DAYS BEFORE ISSUING A FINAL NEGATIVE REPORT   Report Status PENDING   Incomplete   CULTURE, BLOOD (ROUTINE X 2)     Status: Normal (Preliminary result)   Collection Time   01/03/12  8:39 PM      Component Value Range Status Comment   Specimen Description BLOOD RIGHT FOREARM   Final    Special Requests BOTTLES DRAWN AEROBIC ONLY 10CC   Final    Culture  Setup Time [id number]   Final    Culture     Final    Value:        BLOOD CULTURE RECEIVED NO GROWTH TO DATE CULTURE WILL BE HELD FOR 5 DAYS BEFORE ISSUING A FINAL NEGATIVE REPORT   Report Status PENDING   Incomplete   CULTURE, SPUTUM-ASSESSMENT     Status: Normal   Collection Time   01/04/12  6:48 AM      Component Value Range Status Comment   Specimen Description SPUTUM   Final    Special Requests NONE   Final    Sputum evaluation     Final    Value: MICROSCOPIC FINDINGS SUGGEST THAT THIS SPECIMEN IS NOT REPRESENTATIVE OF LOWER RESPIRATORY SECRETIONS. PLEASE RECOLLECT.     CALLED TO [facility] RN 01/04/12 0800 COSTELLO B   Report Status 01/04/2012 FINAL   Final   MRSA PCR SCREENING     Status: Normal   Collection Time   01/04/12  4:20 PM      Component Value Range Status Comment   MRSA by PCR NEGATIVE  NEGATIVE  Final   CULTURE, ROUTINE-ABSCESS     Status: Normal (Preliminary result)   Collection Time   01/04/12  9:46 PM      Component Value Range Status Comment   Specimen Description ABSCESS BACK   Final    Special Requests PATIENT ON FOLLOWING LORTAZ, DIFLUCAN EPIDURAL   Final    Gram Stain     Final    Value: ABUNDANT WBC PRESENT,BOTH PMN AND MONONUCLEAR     NO ORGANISMS SEEN   Culture NO GROWTH 2 DAYS   Final    Report Status PENDING   Incomplete   ANAEROBIC CULTURE     Status: Normal (Preliminary result)   Collection Time   01/04/12  9:46 PM       Component Value Range Status Comment   Specimen Description ABSCESS BACK   Final    Special Requests PATIENT ON FOLLOWING LORTAZ, DIFLUCAN EPIDURAL   Final    Gram Stain     Final    Value: ABUNDANT WBC PRESENT,BOTH PMN AND MONONUCLEAR     NO ORGANISMS SEEN     Performed at [facility]   Culture     Final    Value: NO ANAEROBES ISOLATED; CULTURE IN PROGRESS FOR 5 DAYS   Report Status PENDING   Incomplete   GRAM STAIN     Status: Normal   Collection Time   01/04/12  9:46 PM      Component Value Range Status Comment   Specimen Description ABSCESS BACK   Final    Special Requests PATIENT ON FOLLOWING LORTAZ, DIFLUCAN EPIDURAL   Final    Gram Stain     Final    Value: ABUNDANT WBC PRESENT,BOTH PMN AND MONONUCLEAR     NO ORGANISMS SEEN   Report Status 01/04/2012 FINAL   Final     Studies/Results: Mr Lumbar Spine W Wo Contrast  01/06/2012  *  RADIOLOGY REPORT*  Clinical Data: Follow-up epidural abscess.  Status post surgical decompression.  MRI LUMBAR SPINE WITHOUT AND WITH CONTRAST  Technique:  Multiplanar and multiecho pulse sequences of the lumbar spine were obtained without and with intravenous contrast.  Contrast: 14mL MULTIHANCE GADOBENATE DIMEGLUMINE 529 MG/ML IV SOLN  Comparison: Prior study 01/04/2012.  Findings: There are surgical changes related to recent L5-S1 wide decompressive laminectomy.  Evacuation of prior epidural abscess is noted with a postop fluid collection in the laminectomy defect area.  Persistent abnormal epidural fluid collections with peripheral enhancement extending from the top of L3 down to L5 consistent with persistent areas of abscess.  There is also a persistent abscess emanating from the right L4-5 facet joint.  The left psoas abscess is slightly larger.  It measures 3.6 x 2.9 cm.  Stable changes of diskitis and osteomyelitis at L3-4.  Stable degenerative disc disease.  IMPRESSION:  1.  Postoperative changes at L5-S1 with wide decompressive laminectomy and  evacuation of posterior epidural abscess.  A postoperative fluid collection is noted in the laminectomy defect area. 2.  Persistent areas of small epidural abscesses extending from the top of L3 down to L5. 3.  Persistent diskitis and osteomyelitis at L3-4. 4.  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 5.  Enlarging left psoas abscess.  This was made a call report.  Original Report Authenticated By: P. Loralie Champagne, M.D.   Ct Abdomen Pelvis W Contrast  01/05/2012  *RADIOLOGY REPORT*  Clinical Data: Severe abdominal pain.  Back pain.  Status post back surgery.  Abdominal drain.  Evaluate psoas muscle abscess.  CT ABDOMEN AND PELVIS WITH CONTRAST  Technique:  Multidetector CT imaging of the abdomen and pelvis was performed following the standard protocol during bolus administration of intravenous contrast.  Contrast: OMNIPAQUE IOHEXOL 300 MG/ML IJ SOLN  Comparison: MRI of the lumbar spine without and with contrast 01/04/2012.The CT of the abdomen and pelvis 12/30/2011.  Findings: A 2.7 x 3.9 x 9.1 7 meters peripherally enhancing collection is present in the left psoas muscle, compatible with an abscess.  There are small extraperitoneal lymph nodes on the left.  The patient is status post L5 and S1 laminectomy.  There is gas within the operative site.  A surgical drain is in place.  A diffuse disc herniation is present L4-5.  Gas is present within an inferiorly extruded disc at L2-3.  Fluid or soft tissue is present within the presacral space.  Moderate bilateral pleural effusions are present.  There is associated dependent atelectasis.  The lungs are otherwise clear. The heart size is normal.  Note is made of a pectus excavatum deformity.  The liver and spleen are within normal limits.  The common bile duct and gallbladder are normal.  The adrenal glands and kidneys are within normal limits.  Atherosclerotic calcifications are present within  the aorta branch vessels.  There is no aneurysm.  Fluid or soft tissue is present within the presacral space. Diverticular changes are present within the rectosigmoid colon.  No focal inflammation is evident.  The remainder of the colon is within normal limits.  The appendix is not discretely visualized.  The bone windows demonstrate marked dextroconvex curvature lumbar spine, centered at L3.  A hemangioma is present at L1.  No other focal lytic or blastic lesions are evident.  IMPRESSION:  1.  9 cm peripherally enhancing fluid collection in the left psoas muscle. 2.  Fluid or soft tissue  within the presacral space.  This likely represents reactive fluid. 3.  Residual gas is present in the paraspinal soft tissues with surgical drains in place.  4.  Fluid or soft tissue in the presacral space.  This likely represents reactive fluid. 5.  Bilateral pleural effusions and associated atelectasis.  Original Report Authenticated By: Jamesetta Orleans. MATTERN, M.D.   Mr Birdie Hopes Wo/w Cm  01/07/2012  *RADIOLOGY REPORT*  Clinical Data: Progressive left eye swelling.  MRI ORBITS WITHOUT AND WITH CONTRAST  Technique:  Multiplanar and multiecho pulse sequences of the orbits and surrounding structures were obtained including fat saturation techniques, before and after intravenous contrast administration.  Contrast:  5 ml MultiHance was utilized (half-dose in an attempt to limit contrast).  Comparison: 01/02/2012 MR.  01/01/2011 CT.  Findings: Progressive left-sided proptosis with diffuse inflammation of the globe and surrounding soft tissue.  Interval enlargement of the left extraocular muscles most notable left medial rectus (consistent with infectious involvement).  Peripheral extraconal thin fluid collection (maximal thickness 3.6 mm) most notable superiorly and laterally suggestive of an abscess. Retrobulbar fatty reticulation.  Significant elongation of the left optic nerve which demonstrates restricted motion suggesting  infection and / or ischemia/infarction.  Preseptal soft tissue swelling extends along the left cheek.  No definitive findings of cavernous sinus thrombosis. Patency of the left superior ophthalmic vein cannot be confirmed.  No brain parenchymal enhancing lesion or abnormal meningeal enhancement.  IMPRESSION: Progressive left-sided proptosis with diffuse inflammation of the globe and surrounding soft tissue.  Peripheral left-sided extraconal thin fluid collection (maximal thickness 3.6 mm) most notable superiorly and laterally suggestive of an abscess.  Significant elongation of the left optic nerve which demonstrates restricted motion suggesting infection and / or ischemia/infarction.  No definitive findings of cavernous sinus thrombosis. Patency of the left superior ophthalmic vein cannot be confirmed.  No brain parenchymal enhancing lesion or abnormal meningeal enhancement.  Images and cased discussed with Dr. Randon Goldsmith ophthalmology service.  Original Report Authenticated By: Fuller Canada, M.D.     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  Continuing with broad spectrum antibiotics with vancomycin and ceftazidime.  Vanco trough goal is 15-20, dose has been adjusted but will need to be followed closely.  MRI with abscess around globe and seen by Dr. Clarisa Kindred.  I will stop fluconazole since not likely fungal.   2) abscess - has had more drainage.  Continuing with antibiotics.    Brieana Shimmin Infectious Diseases 01/07/2012, 4:08 PM

## 2012-01-07 NOTE — Progress Notes (Signed)
Subjective: L eye become much more swollen during night.  Patient having a lot of pressure behind eye Drain in spine came out last PM  Objective: Vital signs in last 24 hours: Filed Vitals:   01/06/12 0527 01/06/12 1325 01/06/12 2100 01/07/12 0500  BP: 137/64 143/65 130/68 149/58  Pulse: 78 85 88 60  Temp: 99 F (37.2 C) 99.4 F (37.4 C) 98.5 F (36.9 C) 98.3 F (36.8 C)  TempSrc: Oral  Oral Oral  Resp: 18 18 18 18   Height:      Weight: 63.7 kg (140 lb 6.9 oz)   62.1 kg (136 lb 14.5 oz)  SpO2: 95% 94% 94% 94%   Weight change: -1.6 kg (-3 lb 8.4 oz)  Intake/Output Summary (Last 24 hours) at 01/07/12 1012 Last data filed at 01/07/12 1002  Gross per 24 hour  Intake 1292.5 ml  Output   2305 ml  Net -1012.5 ml    Physical Exam: General: Awake, Oriented, appears in pain HEENT: L eye orbital swelling/discolored (purple) with proptosis Neck: Supple CV: S1 and S2, rrr Lungs: crackles L side Abdomen: Soft, Nontender, Nondistended, +bowel sounds. Ext: Good pulses. No edema., weakness B/L   Lab Results:  Basename 01/07/12 0642 01/06/12 0531  NA 134* 132*  K 2.9* 3.7  CL 99 97  CO2 28 25  GLUCOSE 96 70  BUN 4* 9  CREATININE 0.52 0.53  CALCIUM 7.3* 7.5*  MG -- --  PHOS -- --   No results found for this basename: AST:2,ALT:2,ALKPHOS:2,BILITOT:2,PROT:2,ALBUMIN:2 in the last 72 hours No results found for this basename: LIPASE:2,AMYLASE:2 in the last 72 hours  Basename 01/07/12 0642 01/06/12 0531  WBC 12.8* 18.5*  NEUTROABS -- --  HGB 10.8* 10.9*  HCT 32.0* 32.2*  MCV 92.5 91.2  PLT 708* 682*   No results found for this basename: CKTOTAL:3,CKMB:3,CKMBINDEX:3,TROPONINI:3 in the last 72 hours No components found with this basename: POCBNP:3 No results found for this basename: DDIMER:2 in the last 72 hours No results found for this basename: HGBA1C:2 in the last 72 hours No results found for this basename: CHOL:2,HDL:2,LDLCALC:2,TRIG:2,CHOLHDL:2,LDLDIRECT:2 in the  last 72 hours No results found for this basename: TSH,T4TOTAL,FREET3,T3FREE,THYROIDAB in the last 72 hours No results found for this basename: VITAMINB12:2,FOLATE:2,FERRITIN:2,TIBC:2,IRON:2,RETICCTPCT:2 in the last 72 hours  Micro Results: Recent Results (from the past 240 hour(s))  URINE CULTURE     Status: Normal   Collection Time   12/31/11  4:15 AM      Component Value Range Status Comment   Specimen Description URINE, CLEAN CATCH   Final    Special Requests NONE   Final    Culture  Setup Time [id number]   Final    Colony Count NO GROWTH   Final    Culture NO GROWTH   Final    Report Status 01/01/2012 FINAL   Final   CULTURE, BLOOD (ROUTINE X 2)     Status: Normal   Collection Time   12/31/11  8:42 AM      Component Value Range Status Comment   Specimen Description BLOOD LEFT ARM   Final    Special Requests BOTTLES DRAWN AEROBIC ONLY 10CC   Final    Culture  Setup Time [id number]   Final    Culture NO GROWTH 5 DAYS   Final    Report Status 01/06/2012 FINAL   Final   CULTURE, BLOOD (ROUTINE X 2)     Status: Normal   Collection Time   12/31/11  8:48 AM  Component Value Range Status Comment   Specimen Description BLOOD LEFT HAND   Final    Special Requests BOTTLES DRAWN AEROBIC ONLY 10CC   Final    Culture  Setup Time [id number]   Final    Culture NO GROWTH 5 DAYS   Final    Report Status 01/06/2012 FINAL   Final   CULTURE, SPUTUM-ASSESSMENT     Status: Normal   Collection Time   12/31/11  2:15 PM      Component Value Range Status Comment   Specimen Description SPUTUM   Final    Special Requests NONE   Final    Sputum evaluation     Final    Value: THIS SPECIMEN IS ACCEPTABLE. RESPIRATORY CULTURE REPORT TO FOLLOW.   Report Status 12/31/2011 FINAL   Final   CULTURE, RESPIRATORY     Status: Normal   Collection Time   12/31/11  2:15 PM      Component Value Range Status Comment   Specimen Description SPUTUM   Final    Special Requests NONE   Final    Gram Stain     Final     Value: NO WBC SEEN     NO SQUAMOUS EPITHELIAL CELLS SEEN     FEW YEAST   Culture NORMAL OROPHARYNGEAL FLORA   Final    Report Status 01/03/2012 FINAL   Final   STOOL CULTURE     Status: Normal   Collection Time   12/31/11  2:45 PM      Component Value Range Status Comment   Specimen Description STOOL   Final    Special Requests NONE   Final    Culture     Final    Value: NO SALMONELLA, SHIGELLA, CAMPYLOBACTER, OR YERSINIA ISOLATED   Report Status 01/04/2012 FINAL   Final   CLOSTRIDIUM DIFFICILE BY PCR     Status: Normal   Collection Time   01/02/12 11:29 AM      Component Value Range Status Comment   C difficile by pcr NEGATIVE  NEGATIVE  Final   EYE CULTURE     Status: Normal (Preliminary result)   Collection Time   01/03/12  1:45 PM      Component Value Range Status Comment   Specimen Description EYE LEFT   Final    Special Requests     Final    Value: VTREOUS PT ON VANC CEFTAZ DO SENS TO ALL GROWTH INCLUDING COAG NEG STAPH PER MD   Culture NO GROWTH 3 DAYS   Final    Report Status PENDING   Incomplete   EYE CULTURE     Status: Normal (Preliminary result)   Collection Time   01/03/12  1:45 PM      Component Value Range Status Comment   Specimen Description EYE LEFT   Final    Special Requests     Final    Value: ANTEROR CHAMBER PT ON VANC CEFTAZ DO SENS TO ALL GROWTH INCLUDING COAG NEG STAPH PER MD   Culture NO GROWTH 3 DAYS   Final    Report Status PENDING   Incomplete   CULTURE, BLOOD (ROUTINE X 2)     Status: Normal (Preliminary result)   Collection Time   01/03/12  8:32 PM      Component Value Range Status Comment   Specimen Description BLOOD RIGHT HAND   Final    Special Requests BOTTLES DRAWN AEROBIC ONLY 10CC   Final    Culture  Setup Time [id number]   Final    Culture     Final    Value:        BLOOD CULTURE RECEIVED NO GROWTH TO DATE CULTURE WILL BE HELD FOR 5 DAYS BEFORE ISSUING A FINAL NEGATIVE REPORT   Report Status PENDING   Incomplete   CULTURE, BLOOD  (ROUTINE X 2)     Status: Normal (Preliminary result)   Collection Time   01/03/12  8:39 PM      Component Value Range Status Comment   Specimen Description BLOOD RIGHT FOREARM   Final    Special Requests BOTTLES DRAWN AEROBIC ONLY 10CC   Final    Culture  Setup Time [id number]   Final    Culture     Final    Value:        BLOOD CULTURE RECEIVED NO GROWTH TO DATE CULTURE WILL BE HELD FOR 5 DAYS BEFORE ISSUING A FINAL NEGATIVE REPORT   Report Status PENDING   Incomplete   CULTURE, SPUTUM-ASSESSMENT     Status: Normal   Collection Time   01/04/12  6:48 AM      Component Value Range Status Comment   Specimen Description SPUTUM   Final    Special Requests NONE   Final    Sputum evaluation     Final    Value: MICROSCOPIC FINDINGS SUGGEST THAT THIS SPECIMEN IS NOT REPRESENTATIVE OF LOWER RESPIRATORY SECRETIONS. PLEASE RECOLLECT.     CALLED TO [facility] RN 01/04/12 0800 COSTELLO B   Report Status 01/04/2012 FINAL   Final   MRSA PCR SCREENING     Status: Normal   Collection Time   01/04/12  4:20 PM      Component Value Range Status Comment   MRSA by PCR NEGATIVE  NEGATIVE  Final   CULTURE, ROUTINE-ABSCESS     Status: Normal (Preliminary result)   Collection Time   01/04/12  9:46 PM      Component Value Range Status Comment   Specimen Description ABSCESS BACK   Final    Special Requests PATIENT ON FOLLOWING LORTAZ, DIFLUCAN EPIDURAL   Final    Gram Stain     Final    Value: ABUNDANT WBC PRESENT,BOTH PMN AND MONONUCLEAR     NO ORGANISMS SEEN   Culture NO GROWTH 2 DAYS   Final    Report Status PENDING   Incomplete   ANAEROBIC CULTURE     Status: Normal (Preliminary result)   Collection Time   01/04/12  9:46 PM      Component Value Range Status Comment   Specimen Description ABSCESS BACK   Final    Special Requests PATIENT ON FOLLOWING LORTAZ, DIFLUCAN EPIDURAL   Final    Gram Stain     Final    Value: ABUNDANT WBC PRESENT,BOTH PMN AND MONONUCLEAR     NO ORGANISMS SEEN     Performed at [facility]   Culture     Final    Value: NO ANAEROBES ISOLATED; CULTURE IN PROGRESS FOR 5 DAYS   Report Status PENDING   Incomplete   GRAM STAIN     Status: Normal   Collection Time   01/04/12  9:46 PM      Component Value Range Status Comment   Specimen Description ABSCESS BACK   Final    Special Requests PATIENT ON FOLLOWING LORTAZ, DIFLUCAN EPIDURAL   Final    Gram Stain     Final    Value: ABUNDANT  WBC PRESENT,BOTH PMN AND MONONUCLEAR     NO ORGANISMS SEEN   Report Status 01/04/2012 FINAL   Final     Studies/Results: Mr Lumbar Spine W Wo Contrast  01/06/2012  *RADIOLOGY REPORT*  Clinical Data: Follow-up epidural abscess.  Status post surgical decompression.  MRI LUMBAR SPINE WITHOUT AND WITH CONTRAST  Technique:  Multiplanar and multiecho pulse sequences of the lumbar spine were obtained without and with intravenous contrast.  Contrast: 14mL MULTIHANCE GADOBENATE DIMEGLUMINE 529 MG/ML IV SOLN  Comparison: Prior study 01/04/2012.  Findings: There are surgical changes related to recent L5-S1 wide decompressive laminectomy.  Evacuation of prior epidural abscess is noted with a postop fluid collection in the laminectomy defect area.  Persistent abnormal epidural fluid collections with peripheral enhancement extending from the top of L3 down to L5 consistent with persistent areas of abscess.  There is also a persistent abscess emanating from the right L4-5 facet joint.  The left psoas abscess is slightly larger.  It measures 3.6 x 2.9 cm.  Stable changes of diskitis and osteomyelitis at L3-4.  Stable degenerative disc disease.  IMPRESSION:  1.  Postoperative changes at L5-S1 with wide decompressive laminectomy and evacuation of posterior epidural abscess.  A postoperative fluid collection is noted in the laminectomy defect area. 2.  Persistent areas of small epidural abscesses extending from the top of L3 down to L5. 3.  Persistent diskitis and osteomyelitis at L3-4. 4.  Persistent septic arthritis  involving the right L4-5 facet joint with associated adjacent posterior abscess. 5.  Enlarging left psoas abscess.  This was made a call report.  Original Report Authenticated By: P. Loralie Champagne, M.D.   Ct Abdomen Pelvis W Contrast  01/05/2012  *RADIOLOGY REPORT*  Clinical Data: Severe abdominal pain.  Back pain.  Status post back surgery.  Abdominal drain.  Evaluate psoas muscle abscess.  CT ABDOMEN AND PELVIS WITH CONTRAST  Technique:  Multidetector CT imaging of the abdomen and pelvis was performed following the standard protocol during bolus administration of intravenous contrast.  Contrast: OMNIPAQUE IOHEXOL 300 MG/ML IJ SOLN  Comparison: MRI of the lumbar spine without and with contrast 01/04/2012.The CT of the abdomen and pelvis 12/30/2011.  Findings: A 2.7 x 3.9 x 9.1 7 meters peripherally enhancing collection is present in the left psoas muscle, compatible with an abscess.  There are small extraperitoneal lymph nodes on the left.  The patient is status post L5 and S1 laminectomy.  There is gas within the operative site.  A surgical drain is in place.  A diffuse disc herniation is present L4-5.  Gas is present within an inferiorly extruded disc at L2-3.  Fluid or soft tissue is present within the presacral space.  Moderate bilateral pleural effusions are present.  There is associated dependent atelectasis.  The lungs are otherwise clear. The heart size is normal.  Note is made of a pectus excavatum deformity.  The liver and spleen are within normal limits.  The common bile duct and gallbladder are normal.  The adrenal glands and kidneys are within normal limits.  Atherosclerotic calcifications are present within the aorta branch vessels.  There is no aneurysm.  Fluid or soft tissue is present within the presacral space. Diverticular changes are present within the rectosigmoid colon.  No focal inflammation is evident.  The remainder of the colon is within normal limits.  The appendix is not  discretely visualized.  The bone windows demonstrate marked dextroconvex curvature lumbar spine, centered at L3.  A hemangioma is  present at L1.  No other focal lytic or blastic lesions are evident.  IMPRESSION:  1.  9 cm peripherally enhancing fluid collection in the left psoas muscle. 2.  Fluid or soft tissue within the presacral space.  This likely represents reactive fluid. 3.  Residual gas is present in the paraspinal soft tissues with surgical drains in place.  4.  Fluid or soft tissue in the presacral space.  This likely represents reactive fluid. 5.  Bilateral pleural effusions and associated atelectasis.  Original Report Authenticated By: Jamesetta Orleans. MATTERN, M.D.    Medications: I have reviewed the patient's current medications. Scheduled Meds:    . bacitracin-polymyxin b   Left Eye QID  . cefTAZidime (FORTAZ)  IV  1 g Intravenous Q8H  . cefTAZidime  100 mg Subconjunctival Once  . cefTAZidime  2.25 mg Intravitreal Once  . enoxaparin (LOVENOX) injection  40 mg Subcutaneous Q24H  . feeding supplement  237 mL Oral BID  . fluconazole (DIFLUCAN) IV  400 mg Intravenous Q24H  . gatifloxacin  1 drop Left Eye Q2H while awake  . pantoprazole  40 mg Oral BID AC  . potassium chloride  10 mEq Intravenous Q1 Hr x 5  . sodium chloride  3 mL Intravenous Q12H  . vancomycin  750 mg Intravenous Q8H  . vancomycin  0.1 mg Intravitreal Once  . vancomycin  25 mg Subconjunctival Once   Continuous Infusions:    . sodium chloride 75 mL/hr at 01/07/12 0850   PRN Meds:.acetaminophen, acetaminophen, HYDROcodone-acetaminophen, HYDROmorphone (DILAUDID) injection, ondansetron (ZOFRAN) IV, ondansetron  Assessment/Plan: Assessment:  46 female with chr low back pain and hx of hep B presents with 2 days hx of painless left eye blindness with hx of nausea. Vomiting an diarrhea 1 week back with findings of left anterior uveitis and multilobar left sided pneumonia on CT scan.  Worsening uveitis on exam with  MRI orbit now showing panopthalmitis.  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  abx coverage broadened with IV vanco and cefepime ( 3/3) . Also added fluconazole for fungal coverage on 3/4.  -Dr Randon Goldsmith performed vitrectomy on 3/4 with cx sent and will follow. As per opthalmology She does not Have retinal vision so recommends to hold off on surgery for now and monitor .  -Cont topical abx ( ointment over left eye )  Eye worsened overnight so Dr. 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 (9cm in L psoas) evident on MRI from 3/5 and likely the source of infection   ID and opthalmology  Drain placed by IR- fell out on 3/7 Neurosurgery consulted- to have drain placed in L psoas muscle on 3/8 Cont empiric abx   Left sided multilobar PNA  Secondary to infection- on abx  Anemia  Iron  deficiency noted on iron panel  b12 and folate wnl  Stool for occult blood negative  Will monitor closely, hold off on trasnfusion For now until acute issues resolve  will need GI eval once acute issue resolved   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   Diarrhea  Prior to admission  c diff negative  resolved    DVT prophylaxis: restart  lovenox SCD   Full code   Consult:  opthalmology :[place] and Dr Everardo Pacific  ID : [place]  Pulmonary :Sherene Sires  TO DO: Psoas muscle drain placed today MR of orbit with out contrast and possibly with contrast tomm Will arrange TEE for complete evaluation on Monday (by Corinda Gubler)     LOS: 8 days  Jianna Drabik, DO 01/07/2012, 10:12 AM

## 2012-01-07 NOTE — Progress Notes (Addendum)
Patient ID: Donna Tapia, female   DOB: 1950/12/26, 62 y.o.   MRN: [id number]   Relevant meds:  IV:  Vanc, Fortax, Diflucan  Topical: polybac ung QID, gatiflox gtt q 2 hrs  Local:  S/p vit tap/inject intravit and subconj/periorbital Van and Ceftax OS, 01/03/12  S/p Intravitreal and orbital sub-conjunctival injection of Ceftazidime and Vancomycin, 01/05/2012   Lighted bedside exam at ~1330:  OD  External/adnexa: Normal  Lids/lashes: Normal  Conjunctiva White, quiet  Cornea: Clear  OS  External/adnexa: 3-4+ periocular erythema, Taught 3+ edema.  Significant restriction to retropulsion of globe.  There is marked axial proptosis. Lids/lashes: Complete ptosis; 3+ edema and 3+ erythema (markedly worse c/t  Prior exam description of Dr. Clarisa Kindred)  Conjunctiva:  ~6cm of lago/mechanical exposure. 3+ Bullous chemosis with yellowish hue; 3+ hyperemia.  Cornea: Diffusely edematous with loss of clarity  AC: limited view   Micro data:  Blood, vit/ac, abscess:  No growth.       Assessment and Plan:  -- Endogenous endophthalmitis/panophthalmitis OS, S/p vit tap/inject OS 01/03/12  S/p Intravitreal and orbital sub-conjunctival injection of Ceftazidime and Vancomycin x 2, 01/05/2012 mid-afternoon   Clinical signs of orbital disease appear much worse c/t exam <24 hrs prior.  Spoke to primary this a.m. And asked for repeat imaging which shows worsening proptosis, enlargement of EOMs, nerve involvement, ?early abscess formation (Reviewed with Neurorads).   Agree with continuation of broad spectrum IV abx, and the IR drainage of the psoas abscess.   Given the worsening orbital process, likely ischemia of orbital contents, I recommend and proceeded with lateral canthotomy and inferior and superior cantholysis (note below).    Procedure:  Canthotomy and inferior and superior cantholysis OS.  Dx:  Panophthalmitis, with acute exacerbation of orbital disease; tight orbit; likely ischemic and compromised  intraorbital contents including EOMs.  Anesth:  Local infiltrative 2% lido with epi to lids and to orbital rim laterally; IV morphine.  EBL: 4-5ccs.  Comps: none.  Verbal consent obtained a the bedside with full explanation of indications, risks, potential benefits, and complications.  Patient voiced verbal understanding and full consent.  Prepped with betadine. Clamped lateral canthus with hemostat; canthotomy with scissor; anterior traction on lower lid, inferior cantholysis; anterior traction on upper lid, superior cantholysis; pressure held for hemostasis.  Copious baci oph ung to cover exposed conj and to canthotomy site.   Dispo:  Tolerated procedure well; to IR under care of RN for drainage of abscess.     Discussed this case with Neuro-surg, and am reaching out to two X orbital specialists to discuss further dispo and any other recommendations.  Will update note and plan this evening.   Patient evaluated at ~1330. Note signed at 604 pm.

## 2012-01-07 NOTE — Progress Notes (Signed)
ANTIBIOTIC CONSULT NOTE - FOLLOW UP  Pharmacy Consult for Vancomycin Indication: endopthalmitis, epidural,psoas abscess  Allergies  Allergen Reactions  . Sulfur     Patient Measurements: Height: 5\' 9"  (175.3 cm) Weight: 136 lb 14.5 oz (62.1 kg) IBW/kg (Calculated) : 66.2   Vital Signs: Temp: 98.3 F (36.8 C) (03/08 0500) Temp src: Oral (03/08 0500) BP: 149/58 mmHg (03/08 0500) Pulse Rate: 60  (03/08 0500) Intake/Output from previous day: 03/07 0701 - 03/08 0700 In: 1290.5 [P.O.:240; I.V.:800.5; IV Piggyback:250] Out: 2065 [Urine:2000; Drains:65] Intake/Output from this shift:    Labs:  Basename 01/07/12 0642 01/06/12 0531 01/05/12 0447  WBC 12.8* 18.5* 18.5*  HGB 10.8* 10.9* 10.9*  PLT 708* 682* 665*  LABCREA -- -- --  CREATININE 0.52 0.53 0.62   Estimated Creatinine Clearance: 71.5 ml/min (by C-G formula based on Cr of 0.52).  Basename 01/05/12 1727  VANCOTROUGH 8.3*  VANCOPEAK --  Drue Dun --  GENTTROUGH --  GENTPEAK --  GENTRANDOM --  TOBRATROUGH --  TOBRAPEAK --  TOBRARND --  AMIKACINPEAK --  AMIKACINTROU --  AMIKACIN --     Microbiology: Recent Results (from the past 720 hour(s))  URINE CULTURE     Status: Normal   Collection Time   12/31/11  4:15 AM      Component Value Range Status Comment   Specimen Description URINE, CLEAN CATCH   Final    Special Requests NONE   Final    Culture  Setup Time [id number]   Final    Colony Count NO GROWTH   Final    Culture NO GROWTH   Final    Report Status 01/01/2012 FINAL   Final   CULTURE, BLOOD (ROUTINE X 2)     Status: Normal   Collection Time   12/31/11  8:42 AM      Component Value Range Status Comment   Specimen Description BLOOD LEFT ARM   Final    Special Requests BOTTLES DRAWN AEROBIC ONLY 10CC   Final    Culture  Setup Time [id number]   Final    Culture NO GROWTH 5 DAYS   Final    Report Status 01/06/2012 FINAL   Final   CULTURE, BLOOD (ROUTINE X 2)     Status: Normal   Collection Time     12/31/11  8:48 AM      Component Value Range Status Comment   Specimen Description BLOOD LEFT HAND   Final    Special Requests BOTTLES DRAWN AEROBIC ONLY 10CC   Final    Culture  Setup Time [id number]   Final    Culture NO GROWTH 5 DAYS   Final    Report Status 01/06/2012 FINAL   Final   CULTURE, SPUTUM-ASSESSMENT     Status: Normal   Collection Time   12/31/11  2:15 PM      Component Value Range Status Comment   Specimen Description SPUTUM   Final    Special Requests NONE   Final    Sputum evaluation     Final    Value: THIS SPECIMEN IS ACCEPTABLE. RESPIRATORY CULTURE REPORT TO FOLLOW.   Report Status 12/31/2011 FINAL   Final   CULTURE, RESPIRATORY     Status: Normal   Collection Time   12/31/11  2:15 PM      Component Value Range Status Comment   Specimen Description SPUTUM   Final    Special Requests NONE   Final    Gram Stain  Final    Value: NO WBC SEEN     NO SQUAMOUS EPITHELIAL CELLS SEEN     FEW YEAST   Culture NORMAL OROPHARYNGEAL FLORA   Final    Report Status 01/03/2012 FINAL   Final   STOOL CULTURE     Status: Normal   Collection Time   12/31/11  2:45 PM      Component Value Range Status Comment   Specimen Description STOOL   Final    Special Requests NONE   Final    Culture     Final    Value: NO SALMONELLA, SHIGELLA, CAMPYLOBACTER, OR YERSINIA ISOLATED   Report Status 01/04/2012 FINAL   Final   CLOSTRIDIUM DIFFICILE BY PCR     Status: Normal   Collection Time   01/02/12 11:29 AM      Component Value Range Status Comment   C difficile by pcr NEGATIVE  NEGATIVE  Final   EYE CULTURE     Status: Normal (Preliminary result)   Collection Time   01/03/12  1:45 PM      Component Value Range Status Comment   Specimen Description EYE LEFT   Final    Special Requests     Final    Value: VTREOUS PT ON VANC CEFTAZ DO SENS TO ALL GROWTH INCLUDING COAG NEG STAPH PER MD   Culture NO GROWTH 3 DAYS   Final    Report Status PENDING   Incomplete   EYE CULTURE     Status:  Normal (Preliminary result)   Collection Time   01/03/12  1:45 PM      Component Value Range Status Comment   Specimen Description EYE LEFT   Final    Special Requests     Final    Value: ANTEROR CHAMBER PT ON VANC CEFTAZ DO SENS TO ALL GROWTH INCLUDING COAG NEG STAPH PER MD   Culture NO GROWTH 3 DAYS   Final    Report Status PENDING   Incomplete   CULTURE, BLOOD (ROUTINE X 2)     Status: Normal (Preliminary result)   Collection Time   01/03/12  8:32 PM      Component Value Range Status Comment   Specimen Description BLOOD RIGHT HAND   Final    Special Requests BOTTLES DRAWN AEROBIC ONLY 10CC   Final    Culture  Setup Time [id number]   Final    Culture     Final    Value:        BLOOD CULTURE RECEIVED NO GROWTH TO DATE CULTURE WILL BE HELD FOR 5 DAYS BEFORE ISSUING A FINAL NEGATIVE REPORT   Report Status PENDING   Incomplete   CULTURE, BLOOD (ROUTINE X 2)     Status: Normal (Preliminary result)   Collection Time   01/03/12  8:39 PM      Component Value Range Status Comment   Specimen Description BLOOD RIGHT FOREARM   Final    Special Requests BOTTLES DRAWN AEROBIC ONLY 10CC   Final    Culture  Setup Time [id number]   Final    Culture     Final    Value:        BLOOD CULTURE RECEIVED NO GROWTH TO DATE CULTURE WILL BE HELD FOR 5 DAYS BEFORE ISSUING A FINAL NEGATIVE REPORT   Report Status PENDING   Incomplete   CULTURE, SPUTUM-ASSESSMENT     Status: Normal   Collection Time   01/04/12  6:48 AM  Component Value Range Status Comment   Specimen Description SPUTUM   Final    Special Requests NONE   Final    Sputum evaluation     Final    Value: MICROSCOPIC FINDINGS SUGGEST THAT THIS SPECIMEN IS NOT REPRESENTATIVE OF LOWER RESPIRATORY SECRETIONS. PLEASE RECOLLECT.     CALLED TO [facility] RN 01/04/12 0800 COSTELLO B   Report Status 01/04/2012 FINAL   Final   MRSA PCR SCREENING     Status: Normal   Collection Time   01/04/12  4:20 PM      Component Value Range Status Comment   MRSA by PCR  NEGATIVE  NEGATIVE  Final   CULTURE, ROUTINE-ABSCESS     Status: Normal (Preliminary result)   Collection Time   01/04/12  9:46 PM      Component Value Range Status Comment   Specimen Description ABSCESS BACK   Final    Special Requests PATIENT ON FOLLOWING LORTAZ, DIFLUCAN EPIDURAL   Final    Gram Stain     Final    Value: ABUNDANT WBC PRESENT,BOTH PMN AND MONONUCLEAR     NO ORGANISMS SEEN   Culture NO GROWTH 2 DAYS   Final    Report Status PENDING   Incomplete   ANAEROBIC CULTURE     Status: Normal (Preliminary result)   Collection Time   01/04/12  9:46 PM      Component Value Range Status Comment   Specimen Description ABSCESS BACK   Final    Special Requests PATIENT ON FOLLOWING LORTAZ, DIFLUCAN EPIDURAL   Final    Gram Stain     Final    Value: ABUNDANT WBC PRESENT,BOTH PMN AND MONONUCLEAR     NO ORGANISMS SEEN     Performed at [facility]   Culture     Final    Value: NO ANAEROBES ISOLATED; CULTURE IN PROGRESS FOR 5 DAYS   Report Status PENDING   Incomplete   GRAM STAIN     Status: Normal   Collection Time   01/04/12  9:46 PM      Component Value Range Status Comment   Specimen Description ABSCESS BACK   Final    Special Requests PATIENT ON FOLLOWING LORTAZ, DIFLUCAN EPIDURAL   Final    Gram Stain     Final    Value: ABUNDANT WBC PRESENT,BOTH PMN AND MONONUCLEAR     NO ORGANISMS SEEN   Report Status 01/04/2012 FINAL   Final     Anti-infectives     Start     Dose/Rate Route Frequency Ordered Stop   01/06/12 0200   vancomycin (VANCOCIN) 750 mg in sodium chloride 0.9 % 150 mL IVPB        750 mg 150 mL/hr over 60 Minutes Intravenous Every 8 hours 01/05/12 1952     01/05/12 1300   cefTAZidime (FORTAZ) ophth injection 100 mg        100 mg Subconjunctival  Once 01/05/12 1247 01/05/12 1443   01/05/12 1300   vancomycin (VANCOCIN) ophth injection 25 mg        25 mg Subconjunctival  Once 01/05/12 1247 01/05/12 1446   01/05/12 1300   vancomycin (VANCOCIN) injection INJ 1  mg        1 mg Subconjunctival  Once 01/05/12 1247 01/05/12 1446   01/05/12 1200   cefTAZidime (FORTAZ) ophth injection 2.25 mg        2.25 mg Intravitreal  Once 01/05/12 1053 01/05/12 1444   01/04/12 2134  50,000 units bacitracin in 0.9% normal saline 250 mL irrigation  Status:  Discontinued          As needed 01/04/12 2145 01/04/12 2247   01/04/12 2100   bacitracin 40981 UNITS injection     Comments: AURAND, BRANDI: cabinet override         01/04/12 2100 01/05/12 0859   01/04/12 2100   ceFAZolin (ANCEF) 1-5 GM-% IVPB     Comments: AURAND, BRANDI: cabinet override         01/04/12 2100 01/05/12 0859   01/04/12 1830   fluconazole (DIFLUCAN) IVPB 400 mg        400 mg 200 mL/hr over 60 Minutes Intravenous Every 24 hours 01/03/12 1827     01/03/12 1930   fluconazole (DIFLUCAN) IVPB 800 mg        800 mg 400 mL/hr over 60 Minutes Intravenous  Once 01/03/12 1826 01/03/12 2139   01/03/12 1245   cefTAZidime (FORTAZ) ophth injection 2.25 mg        2.25 mg Intravitreal  Once 01/03/12 1135     01/03/12 1245   cefTAZidime (FORTAZ) ophth injection 100 mg        100 mg Subconjunctival  Once 01/03/12 1135     01/03/12 1200   vancomycin (VANCOCIN) ophth injection 0.1 mg        0.1 mg Intravitreal  Once 01/03/12 1130     01/03/12 1200   vancomycin (VANCOCIN) ophth injection 25 mg        25 mg Subconjunctival  Once 01/03/12 1131     01/03/12 0500   vancomycin (VANCOCIN) 750 mg in sodium chloride 0.9 % 150 mL IVPB  Status:  Discontinued        750 mg 150 mL/hr over 60 Minutes Intravenous Every 12 hours 01/02/12 1716 01/05/12 1953   01/03/12 0200   ceFEPIme (MAXIPIME) 2 g in dextrose 5 % 50 mL IVPB  Status:  Discontinued        2 g 100 mL/hr over 30 Minutes Intravenous Every 8 hours 01/02/12 1716 01/02/12 2041   01/02/12 2200   cefTAZidime (FORTAZ) 1 g in dextrose 5 % 50 mL IVPB        1 g 100 mL/hr over 30 Minutes Intravenous 3 times per day 01/02/12 2106     01/02/12 1400   ceFEPIme  (MAXIPIME) 2 g in dextrose 5 % 50 mL IVPB  Status:  Discontinued        2 g 100 mL/hr over 30 Minutes Intravenous 3 times per day 01/02/12 1318 01/02/12 1716   01/02/12 1400   vancomycin (VANCOCIN) 750 mg in sodium chloride 0.9 % 150 mL IVPB  Status:  Discontinued        750 mg 150 mL/hr over 60 Minutes Intravenous Every 12 hours 01/02/12 1350 01/02/12 1716   12/31/11 1530   valACYclovir (VALTREX) tablet 2,000 mg        2,000 mg Oral 2 times daily 12/31/11 1358 12/31/11 2235   12/31/11 0100   moxifloxacin (AVELOX) IVPB 400 mg  Status:  Discontinued        400 mg 250 mL/hr over 60 Minutes Intravenous Every 24 hours 12/31/11 0040 01/03/12 1913          Assessment: Pt 61 yo female on D6 Vanc/Fortaz, D4 fluconazole for endophthalmitis, uvetitis. Work up in progress, ID now seeing, with lumbar wound epidural abscess- surgery completed, Vitrectomy performed 3.4.13, cxs NGTD . Renal function stable, est. crcl ~ 70.  Goal of Therapy:  Vancomycin trough level 15-20 mcg/ml  Plan:  - continue vancomycin to 750 mg IV Q8hrs - recheck vancomycin trough early next week - no change in Fortaz dose  Elwin Sleight 01/07/2012,9:43 AM

## 2012-01-07 NOTE — Anesthesia Postprocedure Evaluation (Signed)
  Anesthesia Post-op Note  Patient: Donna Tapia  Procedure(s) Performed: Procedure(s) (LRB): EVISCERATION REPAIR (Left)  Patient Location: PACU  Anesthesia Type: General  Level of Consciousness: awake, alert  and oriented  Airway and Oxygen Therapy: Patient Spontanous Breathing and Patient connected to nasal cannula oxygen  Post-op Pain: mild  Post-op Assessment: Post-op Vital signs reviewed and Patient's Cardiovascular Status Stable  Post-op Vital Signs: stable  Complications: No apparent anesthesia complications

## 2012-01-07 NOTE — Procedures (Signed)
Successful CT fluoro guided 10 fr paraspinal psoas abscess drain placement No comp Stable 20cc purulent fld aspirated GS CX sent

## 2012-01-07 NOTE — Progress Notes (Signed)
Patient ID: Donna Tapia, female   DOB: [birth year], 61 y.o.   MRN: [id number] Looks much better from neurosurgical standpoint as back pain is much better. Neuro is intact regarding lower extremities. She NEEDS to have her psoas abscess dealt with today as that could easily reseed the spine and needs to be drained by IR! Hemovac came out, so discontinued entirely. Will continue to follow. MRI yesterday looks good as far as epidural process is concerned.

## 2012-01-07 NOTE — Progress Notes (Signed)
PT Note  Pt politely refused due to waiting to go for further testing/procedures.  Will follow up tomorrow.  [employer] PT [phone]

## 2012-01-07 NOTE — Progress Notes (Addendum)
Patient ID: Donna Tapia, female   DOB: Jan 07, 1951, 61 y.o.   MRN: [id number]  Addendum to today's ophtho progress note:   Spoke in detail to two orbital specialist.  Detailed phone consultation with [facility] orbital specialist.  Recommends evisceration OS.    Discussed this recommendation in detail with Donna Tapia.  Please see operative note first paragraph regarding my discussions with the patient.  Relevant factors for this decision include but are not limited to:    -- No light perception vision OS.   -- Marked inflammation and visible disorganization of globe and intraocular contents on MRI.   -- No reasonable expectation for any hope of salvage of this eye.    -- Worsening/spreading orbital involvement in setting of above.    -- Worsening patient pain and discomfort.    Donna Tapia' questions were answered to her satisfaction.  She agreed to proceed with evisceration with full understanding of the above.     * For any postop questions please do not page ophtho on call.  Please call Dr. Randon Goldsmith directly [phone]*

## 2012-01-07 NOTE — Anesthesia Preprocedure Evaluation (Addendum)
Anesthesia Evaluation  Patient identified by MRN, date of birth, ID band Patient awake    Reviewed: Allergy & Precautions, H&P , NPO status , Patient's Chart, lab work & pertinent test results  Airway Mallampati: II    Mouth opening: Limited Mouth Opening  Dental  (+) Teeth Intact   Pulmonary  breath sounds clear to auscultation        Cardiovascular Rhythm:Regular Rate:Tachycardia     Neuro/Psych    GI/Hepatic   Endo/Other    Renal/GU      Musculoskeletal   Abdominal   Peds  Hematology   Anesthesia Other Findings   Reproductive/Obstetrics                           Anesthesia Physical Anesthesia Plan  ASA: III and Emergent  Anesthesia Plan: General   Post-op Pain Management:    Induction: Intravenous  Airway Management Planned: Oral ETT  Additional Equipment:   Intra-op Plan:   Post-operative Plan: Extubation in OR  Informed Consent: I have reviewed the patients History and Physical, chart, labs and discussed the procedure including the risks, benefits and alternatives for the proposed anesthesia with the patient or authorized representative who has indicated his/her understanding and acceptance.   Dental advisory given  Plan Discussed with: CRNA and Surgeon  Anesthesia Plan Comments: (Severe Panopthalmitis Pneumonia L. Lung S/P I and D of lumbar epidural abscess and catheter drainage of psoas abscess  H/O hepatitis B  Plan GA with oral ETT  Kipp Brood, MD)    Anesthesia Quick Evaluation

## 2012-01-07 NOTE — Anesthesia Procedure Notes (Signed)
Procedure Name: Intubation Date/Time: 01/07/2012 8:39 PM Performed by: Glendora Score A Pre-anesthesia Checklist: Patient identified, Emergency Drugs available, Suction available and Patient being monitored Patient Re-evaluated:Patient Re-evaluated prior to inductionOxygen Delivery Method: Circle system utilized Preoxygenation: Pre-oxygenation with 100% oxygen Intubation Type: IV induction, Rapid sequence and Cricoid Pressure applied Laryngoscope Size: Miller and 2 Grade View: Grade I Tube type: Oral Tube size: 7.5 mm Number of attempts: 1 Airway Equipment and Method: Stylet and LTA kit utilized Placement Confirmation: ETT inserted through vocal cords under direct vision,  positive ETCO2 and breath sounds checked- equal and bilateral Secured at: 21 cm Tube secured with: Tape Dental Injury: Teeth and Oropharynx as per pre-operative assessment

## 2012-01-08 DIAGNOSIS — H5460 Unqualified visual loss, one eye, unspecified: Secondary | ICD-10-CM

## 2012-01-08 LAB — BASIC METABOLIC PANEL
BUN: 3 mg/dL — ABNORMAL LOW (ref 6–23)
CO2: 28 mEq/L (ref 19–32)
Calcium: 7.1 mg/dL — ABNORMAL LOW (ref 8.4–10.5)
Chloride: 101 mEq/L (ref 96–112)
Creatinine, Ser: 0.47 mg/dL — ABNORMAL LOW (ref 0.50–1.10)

## 2012-01-08 LAB — VANCOMYCIN, TROUGH: Vancomycin Tr: 16.8 ug/mL (ref 10.0–20.0)

## 2012-01-08 LAB — CBC
HCT: 28.1 % — ABNORMAL LOW (ref 36.0–46.0)
MCH: 30.5 pg (ref 26.0–34.0)
MCV: 92.1 fL (ref 78.0–100.0)
Platelets: 662 10*3/uL — ABNORMAL HIGH (ref 150–400)
RBC: 3.05 MIL/uL — ABNORMAL LOW (ref 3.87–5.11)

## 2012-01-08 MED ORDER — ARTIFICIAL TEARS OP OINT
TOPICAL_OINTMENT | Freq: Four times a day (QID) | OPHTHALMIC | Status: DC
  Administered 2012-01-08: 1 via OPHTHALMIC
  Administered 2012-01-08 – 2012-01-12 (×13): via OPHTHALMIC
  Administered 2012-01-13: 1 via OPHTHALMIC
  Administered 2012-01-13 – 2012-01-17 (×16): via OPHTHALMIC
  Filled 2012-01-08 (×3): qty 3.5

## 2012-01-08 MED ORDER — ARTIFICIAL TEARS OP OINT
TOPICAL_OINTMENT | Freq: Four times a day (QID) | OPHTHALMIC | Status: DC
  Filled 2012-01-08: qty 3.5

## 2012-01-08 MED ORDER — POTASSIUM CHLORIDE CRYS ER 20 MEQ PO TBCR
40.0000 meq | EXTENDED_RELEASE_TABLET | Freq: Once | ORAL | Status: AC
  Administered 2012-01-08: 40 meq via ORAL
  Filled 2012-01-08: qty 2

## 2012-01-08 MED ORDER — BACITRACIN-POLYMYXIN B 500-10000 UNIT/GM OP OINT
TOPICAL_OINTMENT | Freq: Four times a day (QID) | OPHTHALMIC | Status: DC
  Administered 2012-01-08 (×2): via OPHTHALMIC
  Filled 2012-01-08: qty 3.5

## 2012-01-08 MED ORDER — ALUM & MAG HYDROXIDE-SIMETH 200-200-20 MG/5ML PO SUSP
15.0000 mL | ORAL | Status: DC | PRN
  Administered 2012-01-08: 15 mL via ORAL
  Filled 2012-01-08: qty 30

## 2012-01-08 MED ORDER — BACITRACIN-POLYMYXIN B 500-10000 UNIT/GM OP OINT
TOPICAL_OINTMENT | Freq: Four times a day (QID) | OPHTHALMIC | Status: DC
  Administered 2012-01-08 – 2012-01-14 (×23): via OPHTHALMIC
  Administered 2012-01-15: 1 via OPHTHALMIC
  Administered 2012-01-15 – 2012-01-17 (×8): via OPHTHALMIC
  Filled 2012-01-08 (×2): qty 3.5

## 2012-01-08 NOTE — Progress Notes (Signed)
PT Treatment Note   01/08/12 1522  PT Visit Information  Last PT Received On 01/08/12  Precautions  Precautions Fall  Bed Mobility  Rolling Left 5: Supervision;With rail  Left Sidelying to Sit 4: Min assist;With rails;HOB elevated (comment degrees) (HOB 30 degrees)  Left Sidelying to Sit Details (indicate cue type and reason) assist to bring trunk up  Sitting - Scoot to Edge of Bed 6: Modified independent (Device/Increase time)  Sit to Supine 4: Min assist  Sit to Supine - Details (indicate cue type and reason) assist to lower trunk  Transfers  Sit to Stand 4: Min assist;With upper extremity assist;From bed  Stand to Sit 5: Supervision;With upper extremity assist;To bed  Stand to Sit Details supervision to watch lines/drains  Ambulation/Gait  Ambulation/Gait Assistance 4: Min assist  Ambulation/Gait Assistance Details (indicate cue type and reason) Cues to stand fully erect  Ambulation Distance (Feet) 100 Feet  Assistive device 1 person hand held assist  Gait Pattern Decreased step length - right;Decreased step length - left;Trunk flexed (narrow base)  Static Standing Balance  Static Standing - Balance Support No upper extremity supported  Static Standing - Level of Assistance 4: Min assist  Dynamic Standing Balance  Dynamic Standing - Balance Support Left upper extremity supported  Dynamic Standing - Level of Assistance 4: Min assist  PT - End of Session  Activity Tolerance Patient limited by fatigue  Patient left in bed;with call bell in reach  Nurse Communication Mobility status for ambulation  General  Behavior During Session [facility] for tasks performed  [facility] for tasks performed  PT - Assessment/Plan  Comments on Treatment Session Pt adm with lt eye infection.  Found to have lumbar and psoas abscess as well that were treated with surgery and drainage.  Pt now also with surgery to remove lt eye last night. Pt making progress with mobility.  Follow Up Recommendations  Skilled nursing facility  Equipment Recommended Defer to next venue  Acute Rehab PT Goals  PT Goal: Supine/Side to Sit - Progress Progressing toward goal  PT Goal: Sit to Supine/Side - Progress Progressing toward goal  PT Goal: Sit to Stand - Progress Progressing toward goal  PT Goal: Stand to Sit - Progress Progressing toward goal  PT Goal: Ambulate - Progress Progressing toward goal    [facility] PT [phone]

## 2012-01-08 NOTE — Progress Notes (Signed)
Patient ID: Donna Tapia, female   DOB: [birth year], 61 y.o.   MRN: [id number]  POD #1 s/p Evisceration OS.   Donna Tapia reports that the left eye is more comfortable.  She denies pain.  There is much less "pressure" sensation.  She still has low back pain, which responds well to PRN pain meds.    Relevant meds:  IV: Vanc, Fortax  Motility OS:  Improved c/t 16 hrs prior; -3.5 ductions in all directions.   Lighted bedside exam  OD  External/adnexa: Normal  Lids/lashes: Normal  Conjunctiva White, quiet  Cornea: Clear   OS  External/adnexa: 1-2+ periocular erythema, 2+ edema. Mild restriction to retropulsion of orbital contents.  Lids/lashes: 2+ edema and 2+ erythema (improved c/t exam 16 hrs prior); upper and lower bolstered temporal tarsorrhaphy in place; small amount of exposed/prolapsed conj nasally; Conformer in place with iodoform gauze extending through central opening.    Conjunctiva: small amount of nasal exposure. 3+ chemosis; 2-3+ hyperemia.  Covered by conformer.  Relevant LABS:  WBC: 12.8 --> 9.8  Micro: Blood, lumbosacral epidural abscess, psoas abscess, AC/Vit:  No growth.  Intraocular contents gram stain:  GPC in pairs  .  Assessment and Plan:   -- Endogenous endophthalmitis/panophthalmitis OS. POD #1 s/p evisceration OS.  Orbital signs and symptoms have improved.  Will need to follow orbital process (clinically and radiographically) for any acute worsening.    Rec:  Poly/bac alternating with lacrlilube to small amount of exposed conj OS. Continue broad spectrum IV abx.  Follow cultures closely.  CT MXF tomorrow to reassess orbital process.    Signed at 1049a.m.

## 2012-01-08 NOTE — Progress Notes (Signed)
ANTIBIOTIC CONSULT NOTE - FOLLOW UP  Pharmacy Consult for Vancomycin Indication: panopthalmitis, epidural and psoas abscess, L sided pneumonia  Allergies  Allergen Reactions  . Sulfur     Patient Measurements: Height: 5\' 9"  (175.3 cm) Weight: 140 lb 3.4 oz (63.6 kg) IBW/kg (Calculated) : 66.2   Vital Signs: Temp: 98.1 F (36.7 C) (03/09 1400) Temp src: Oral (03/09 1400) BP: 113/64 mmHg (03/09 1400) Pulse Rate: 81  (03/09 1400) Intake/Output from previous day: 03/08 0701 - 03/09 0700 In: 3152 [I.V.:2450; IV Piggyback:700] Out: 1795 [Urine:1750; Drains:45] Intake/Output from this shift: Total I/O In: 723 [P.O.:720; I.V.:3] Out: 1315 [Urine:1300; Drains:15]  Labs:  [facility] 01/08/12 0500 01/07/12 0642 01/06/12 0531  WBC 9.8 12.8* 18.5*  HGB 9.3* 10.8* 10.9*  PLT 662* 708* 682*  LABCREA -- -- --  CREATININE 0.47* 0.52 0.53   Estimated Creatinine Clearance: 73.2 ml/min (by C-G formula based on Cr of 0.47).  Basename 01/08/12 1700  VANCOTROUGH 16.8  VANCOPEAK --  Drue Dun --  GENTTROUGH --  GENTPEAK --  GENTRANDOM --  TOBRATROUGH --  TOBRAPEAK --  TOBRARND --  AMIKACINPEAK --  AMIKACINTROU --  AMIKACIN --     Microbiology: Recent Results (from the past 720 hour(s))  URINE CULTURE     Status: Normal   Collection Time   12/31/11  4:15 AM      Component Value Range Status Comment   Specimen Description URINE, CLEAN CATCH   Final    Special Requests NONE   Final    Culture  Setup Time [id number]   Final    Colony Count NO GROWTH   Final    Culture NO GROWTH   Final    Report Status 01/01/2012 FINAL   Final   CULTURE, BLOOD (ROUTINE X 2)     Status: Normal   Collection Time   12/31/11  8:42 AM      Component Value Range Status Comment   Specimen Description BLOOD LEFT ARM   Final    Special Requests BOTTLES DRAWN AEROBIC ONLY 10CC   Final    Culture  Setup Time [id number]   Final    Culture NO GROWTH 5 DAYS   Final    Report Status 01/06/2012 FINAL    Final   CULTURE, BLOOD (ROUTINE X 2)     Status: Normal   Collection Time   12/31/11  8:48 AM      Component Value Range Status Comment   Specimen Description BLOOD LEFT HAND   Final    Special Requests BOTTLES DRAWN AEROBIC ONLY 10CC   Final    Culture  Setup Time [id number]   Final    Culture NO GROWTH 5 DAYS   Final    Report Status 01/06/2012 FINAL   Final   CULTURE, SPUTUM-ASSESSMENT     Status: Normal   Collection Time   12/31/11  2:15 PM      Component Value Range Status Comment   Specimen Description SPUTUM   Final    Special Requests NONE   Final    Sputum evaluation     Final    Value: THIS SPECIMEN IS ACCEPTABLE. RESPIRATORY CULTURE REPORT TO FOLLOW.   Report Status 12/31/2011 FINAL   Final   CULTURE, RESPIRATORY     Status: Normal   Collection Time   12/31/11  2:15 PM      Component Value Range Status Comment   Specimen Description SPUTUM   Final    Special Requests NONE  Final    Gram Stain     Final    Value: NO WBC SEEN     NO SQUAMOUS EPITHELIAL CELLS SEEN     FEW YEAST   Culture NORMAL OROPHARYNGEAL FLORA   Final    Report Status 01/03/2012 FINAL   Final   STOOL CULTURE     Status: Normal   Collection Time   12/31/11  2:45 PM      Component Value Range Status Comment   Specimen Description STOOL   Final    Special Requests NONE   Final    Culture     Final    Value: NO SALMONELLA, SHIGELLA, CAMPYLOBACTER, OR YERSINIA ISOLATED   Report Status 01/04/2012 FINAL   Final   CLOSTRIDIUM DIFFICILE BY PCR     Status: Normal   Collection Time   01/02/12 11:29 AM      Component Value Range Status Comment   C difficile by pcr NEGATIVE  NEGATIVE  Final   EYE CULTURE     Status: Normal (Preliminary result)   Collection Time   01/03/12  1:45 PM      Component Value Range Status Comment   Specimen Description EYE LEFT   Final    Special Requests     Final    Value: VTREOUS PT ON VANC CEFTAZ DO SENS TO ALL GROWTH INCLUDING COAG NEG STAPH PER MD   Culture NO GROWTH 4 DAYS    Final    Report Status PENDING   Incomplete   EYE CULTURE     Status: Normal (Preliminary result)   Collection Time   01/03/12  1:45 PM      Component Value Range Status Comment   Specimen Description EYE LEFT   Final    Special Requests     Final    Value: ANTEROR CHAMBER PT ON VANC CEFTAZ DO SENS TO ALL GROWTH INCLUDING COAG NEG STAPH PER MD   Culture NO GROWTH 4 DAYS   Final    Report Status PENDING   Incomplete   CULTURE, BLOOD (ROUTINE X 2)     Status: Normal (Preliminary result)   Collection Time   01/03/12  8:32 PM      Component Value Range Status Comment   Specimen Description BLOOD RIGHT HAND   Final    Special Requests BOTTLES DRAWN AEROBIC ONLY 10CC   Final    Culture  Setup Time [id number]   Final    Culture     Final    Value:        BLOOD CULTURE RECEIVED NO GROWTH TO DATE CULTURE WILL BE HELD FOR 5 DAYS BEFORE ISSUING A FINAL NEGATIVE REPORT   Report Status PENDING   Incomplete   CULTURE, BLOOD (ROUTINE X 2)     Status: Normal (Preliminary result)   Collection Time   01/03/12  8:39 PM      Component Value Range Status Comment   Specimen Description BLOOD RIGHT FOREARM   Final    Special Requests BOTTLES DRAWN AEROBIC ONLY 10CC   Final    Culture  Setup Time [id number]   Final    Culture     Final    Value:        BLOOD CULTURE RECEIVED NO GROWTH TO DATE CULTURE WILL BE HELD FOR 5 DAYS BEFORE ISSUING A FINAL NEGATIVE REPORT   Report Status PENDING   Incomplete   CULTURE, SPUTUM-ASSESSMENT     Status: Normal   Collection Time  01/04/12  6:48 AM      Component Value Range Status Comment   Specimen Description SPUTUM   Final    Special Requests NONE   Final    Sputum evaluation     Final    Value: MICROSCOPIC FINDINGS SUGGEST THAT THIS SPECIMEN IS NOT REPRESENTATIVE OF LOWER RESPIRATORY SECRETIONS. PLEASE RECOLLECT.     CALLED TO [facility] RN 01/04/12 0800 COSTELLO B   Report Status 01/04/2012 FINAL   Final   MRSA PCR SCREENING     Status: Normal   Collection Time    01/04/12  4:20 PM      Component Value Range Status Comment   MRSA by PCR NEGATIVE  NEGATIVE  Final   CULTURE, ROUTINE-ABSCESS     Status: Normal   Collection Time   01/04/12  9:46 PM      Component Value Range Status Comment   Specimen Description ABSCESS BACK   Final    Special Requests PATIENT ON FOLLOWING LORTAZ, DIFLUCAN EPIDURAL   Final    Gram Stain     Final    Value: ABUNDANT WBC PRESENT,BOTH PMN AND MONONUCLEAR     NO ORGANISMS SEEN   Culture NO GROWTH 3 DAYS   Final    Report Status 01/08/2012 FINAL   Final   ANAEROBIC CULTURE     Status: Normal (Preliminary result)   Collection Time   01/04/12  9:46 PM      Component Value Range Status Comment   Specimen Description ABSCESS BACK   Final    Special Requests PATIENT ON FOLLOWING LORTAZ, DIFLUCAN EPIDURAL   Final    Gram Stain     Final    Value: ABUNDANT WBC PRESENT,BOTH PMN AND MONONUCLEAR     NO ORGANISMS SEEN     Performed at [facility]   Culture     Final    Value: NO ANAEROBES ISOLATED; CULTURE IN PROGRESS FOR 5 DAYS   Report Status PENDING   Incomplete   GRAM STAIN     Status: Normal   Collection Time   01/04/12  9:46 PM      Component Value Range Status Comment   Specimen Description ABSCESS BACK   Final    Special Requests PATIENT ON FOLLOWING LORTAZ, DIFLUCAN EPIDURAL   Final    Gram Stain     Final    Value: ABUNDANT WBC PRESENT,BOTH PMN AND MONONUCLEAR     NO ORGANISMS SEEN   Report Status 01/04/2012 FINAL   Final   CULTURE, ROUTINE-ABSCESS     Status: Normal (Preliminary result)   Collection Time   01/07/12  4:14 PM      Component Value Range Status Comment   Specimen Description ABSCESS   Final    Special Requests PSOAS MUSCLE   Final    Gram Stain     Final    Value: MODERATE WBC PRESENT, PREDOMINANTLY PMN     NO SQUAMOUS EPITHELIAL CELLS SEEN     NO ORGANISMS SEEN   Culture NO GROWTH 1 DAY   Final    Report Status PENDING   Incomplete   CULTURE, ROUTINE-ABSCESS     Status: Normal (Preliminary  result)   Collection Time   01/07/12  9:03 PM      Component Value Range Status Comment   Specimen Description ABSCESS EYE LEFT   Final    Special Requests INTRAOCULAR   Final    Gram Stain     Final  Value: ABUNDANT WBC PRESENT,BOTH PMN AND MONONUCLEAR     RARE GRAM POSITIVE COCCI IN PAIRS     Performed at [facility]   Culture PENDING   Incomplete    Report Status PENDING   Incomplete   ANAEROBIC CULTURE     Status: Normal (Preliminary result)   Collection Time   01/07/12  9:03 PM      Component Value Range Status Comment   Specimen Description ABSCESS EYE LEFT   Final    Special Requests INTRAOCULAR   Final    Gram Stain     Final    Value: ABUNDANT WBC PRESENT,BOTH PMN AND MONONUCLEAR     RARE GRAM POSITIVE COCCI IN PAIRS     Performed at [facility]   Culture     Final    Value: NO ANAEROBES ISOLATED; CULTURE IN PROGRESS FOR 5 DAYS   Report Status PENDING   Incomplete   GRAM STAIN     Status: Normal   Collection Time   01/07/12  9:03 PM      Component Value Range Status Comment   Specimen Description ABSCESS EYE LEFT   Final    Special Requests INTRAOCULAR   Final    Gram Stain     Final    Value: ABUNDANT WBC PRESENT,BOTH PMN AND MONONUCLEAR     RARE GRAM POSITIVE COCCI IN PAIRS   Report Status 01/07/2012 FINAL   Final     Assessment: Pt 61 yo female on Day#7 Vanc/Fortaz for panophthalmitis, L sided pneumonia, epidural abscess and psoas abscess. Renal function stable, est. crcl ~ 70. Multiple cultures are negative. Vancomycin trough therapeutic.  Goal of Therapy:  Vancomycin trough level 15-20 mcg/ml  Plan:  - continue vancomycin at 750 mg IV Q8hrs - continue fortaz 1gm IV q8h - will continue to follow renal function, cultures  Christoper Fabian, PharmD, BCPS Clinical pharmacist, pager [phone] 01/08/2012,6:18 PM

## 2012-01-08 NOTE — Progress Notes (Signed)
Subjective: L eye had to be removed last PM Back pain this AM   Objective: Vital signs in last 24 hours: Filed Vitals:   01/07/12 2232 01/07/12 2247 01/07/12 2311 01/08/12 0607  BP: 135/55 143/56 140/70 132/76  Pulse: 77 75 84 77  Temp:  98.9 F (37.2 C) 99.3 F (37.4 C) 98.7 F (37.1 C)  TempSrc:   Oral Oral  Resp: 22 20 18 14   Height:    5\' 9"  (1.753 m)  Weight:    63.6 kg (140 lb 3.4 oz)  SpO2: 96% 97% 94% 96%   Weight change: 1.5 kg (3 lb 4.9 oz)  Intake/Output Summary (Last 24 hours) at 01/08/12 0932 Last data filed at 01/08/12 1610  Gross per 24 hour  Intake   3155 ml  Output   1795 ml  Net   1360 ml    Physical Exam: General: Awake, Oriented, appears in pain HEENT: L eye removed  Neck: Supple CV: S1 and S2, rrr Lungs: crackles L side Abdomen: Soft, Nontender, Nondistended, +bowel sounds. Ext: Good pulses. No edema., weakness B/L   Lab Results:  Basename 01/08/12 0500 01/07/12 0642  NA 136 134*  K 3.2* 2.9*  CL 101 99  CO2 28 28  GLUCOSE 86 96  BUN 3* 4*  CREATININE 0.47* 0.52  CALCIUM 7.1* 7.3*  MG -- --  PHOS -- --   No results found for this basename: AST:2,ALT:2,ALKPHOS:2,BILITOT:2,PROT:2,ALBUMIN:2 in the last 72 hours No results found for this basename: LIPASE:2,AMYLASE:2 in the last 72 hours  Basename 01/08/12 0500 01/07/12 0642  WBC 9.8 12.8*  NEUTROABS -- --  HGB 9.3* 10.8*  HCT 28.1* 32.0*  MCV 92.1 92.5  PLT 662* 708*   No results found for this basename: CKTOTAL:3,CKMB:3,CKMBINDEX:3,TROPONINI:3 in the last 72 hours No components found with this basename: POCBNP:3 No results found for this basename: DDIMER:2 in the last 72 hours No results found for this basename: HGBA1C:2 in the last 72 hours No results found for this basename: CHOL:2,HDL:2,LDLCALC:2,TRIG:2,CHOLHDL:2,LDLDIRECT:2 in the last 72 hours No results found for this basename: TSH,T4TOTAL,FREET3,T3FREE,THYROIDAB in the last 72 hours No results found for this basename:  VITAMINB12:2,FOLATE:2,FERRITIN:2,TIBC:2,IRON:2,RETICCTPCT:2 in the last 72 hours  Micro Results: Recent Results (from the past 240 hour(s))  URINE CULTURE     Status: Normal   Collection Time   12/31/11  4:15 AM      Component Value Range Status Comment   Specimen Description URINE, CLEAN CATCH   Final    Special Requests NONE   Final    Culture  Setup Time [id number]   Final    Colony Count NO GROWTH   Final    Culture NO GROWTH   Final    Report Status 01/01/2012 FINAL   Final   CULTURE, BLOOD (ROUTINE X 2)     Status: Normal   Collection Time   12/31/11  8:42 AM      Component Value Range Status Comment   Specimen Description BLOOD LEFT ARM   Final    Special Requests BOTTLES DRAWN AEROBIC ONLY 10CC   Final    Culture  Setup Time [id number]   Final    Culture NO GROWTH 5 DAYS   Final    Report Status 01/06/2012 FINAL   Final   CULTURE, BLOOD (ROUTINE X 2)     Status: Normal   Collection Time   12/31/11  8:48 AM      Component Value Range Status Comment   Specimen Description BLOOD LEFT  HAND   Final    Special Requests BOTTLES DRAWN AEROBIC ONLY 10CC   Final    Culture  Setup Time [id number]   Final    Culture NO GROWTH 5 DAYS   Final    Report Status 01/06/2012 FINAL   Final   CULTURE, SPUTUM-ASSESSMENT     Status: Normal   Collection Time   12/31/11  2:15 PM      Component Value Range Status Comment   Specimen Description SPUTUM   Final    Special Requests NONE   Final    Sputum evaluation     Final    Value: THIS SPECIMEN IS ACCEPTABLE. RESPIRATORY CULTURE REPORT TO FOLLOW.   Report Status 12/31/2011 FINAL   Final   CULTURE, RESPIRATORY     Status: Normal   Collection Time   12/31/11  2:15 PM      Component Value Range Status Comment   Specimen Description SPUTUM   Final    Special Requests NONE   Final    Gram Stain     Final    Value: NO WBC SEEN     NO SQUAMOUS EPITHELIAL CELLS SEEN     FEW YEAST   Culture NORMAL OROPHARYNGEAL FLORA   Final    Report Status  01/03/2012 FINAL   Final   STOOL CULTURE     Status: Normal   Collection Time   12/31/11  2:45 PM      Component Value Range Status Comment   Specimen Description STOOL   Final    Special Requests NONE   Final    Culture     Final    Value: NO SALMONELLA, SHIGELLA, CAMPYLOBACTER, OR YERSINIA ISOLATED   Report Status 01/04/2012 FINAL   Final   CLOSTRIDIUM DIFFICILE BY PCR     Status: Normal   Collection Time   01/02/12 11:29 AM      Component Value Range Status Comment   C difficile by pcr NEGATIVE  NEGATIVE  Final   EYE CULTURE     Status: Normal (Preliminary result)   Collection Time   01/03/12  1:45 PM      Component Value Range Status Comment   Specimen Description EYE LEFT   Final    Special Requests     Final    Value: VTREOUS PT ON VANC CEFTAZ DO SENS TO ALL GROWTH INCLUDING COAG NEG STAPH PER MD   Culture NO GROWTH 3 DAYS   Final    Report Status PENDING   Incomplete   EYE CULTURE     Status: Normal (Preliminary result)   Collection Time   01/03/12  1:45 PM      Component Value Range Status Comment   Specimen Description EYE LEFT   Final    Special Requests     Final    Value: ANTEROR CHAMBER PT ON VANC CEFTAZ DO SENS TO ALL GROWTH INCLUDING COAG NEG STAPH PER MD   Culture NO GROWTH 3 DAYS   Final    Report Status PENDING   Incomplete   CULTURE, BLOOD (ROUTINE X 2)     Status: Normal (Preliminary result)   Collection Time   01/03/12  8:32 PM      Component Value Range Status Comment   Specimen Description BLOOD RIGHT HAND   Final    Special Requests BOTTLES DRAWN AEROBIC ONLY 10CC   Final    Culture  Setup Time [id number]   Final    Culture  Final    Value:        BLOOD CULTURE RECEIVED NO GROWTH TO DATE CULTURE WILL BE HELD FOR 5 DAYS BEFORE ISSUING A FINAL NEGATIVE REPORT   Report Status PENDING   Incomplete   CULTURE, BLOOD (ROUTINE X 2)     Status: Normal (Preliminary result)   Collection Time   01/03/12  8:39 PM      Component Value Range Status Comment   Specimen  Description BLOOD RIGHT FOREARM   Final    Special Requests BOTTLES DRAWN AEROBIC ONLY 10CC   Final    Culture  Setup Time [id number]   Final    Culture     Final    Value:        BLOOD CULTURE RECEIVED NO GROWTH TO DATE CULTURE WILL BE HELD FOR 5 DAYS BEFORE ISSUING A FINAL NEGATIVE REPORT   Report Status PENDING   Incomplete   CULTURE, SPUTUM-ASSESSMENT     Status: Normal   Collection Time   01/04/12  6:48 AM      Component Value Range Status Comment   Specimen Description SPUTUM   Final    Special Requests NONE   Final    Sputum evaluation     Final    Value: MICROSCOPIC FINDINGS SUGGEST THAT THIS SPECIMEN IS NOT REPRESENTATIVE OF LOWER RESPIRATORY SECRETIONS. PLEASE RECOLLECT.     CALLED TO [facility] RN 01/04/12 0800 COSTELLO B   Report Status 01/04/2012 FINAL   Final   MRSA PCR SCREENING     Status: Normal   Collection Time   01/04/12  4:20 PM      Component Value Range Status Comment   MRSA by PCR NEGATIVE  NEGATIVE  Final   CULTURE, ROUTINE-ABSCESS     Status: Normal   Collection Time   01/04/12  9:46 PM      Component Value Range Status Comment   Specimen Description ABSCESS BACK   Final    Special Requests PATIENT ON FOLLOWING LORTAZ, DIFLUCAN EPIDURAL   Final    Gram Stain     Final    Value: ABUNDANT WBC PRESENT,BOTH PMN AND MONONUCLEAR     NO ORGANISMS SEEN   Culture NO GROWTH 3 DAYS   Final    Report Status 01/08/2012 FINAL   Final   ANAEROBIC CULTURE     Status: Normal (Preliminary result)   Collection Time   01/04/12  9:46 PM      Component Value Range Status Comment   Specimen Description ABSCESS BACK   Final    Special Requests PATIENT ON FOLLOWING LORTAZ, DIFLUCAN EPIDURAL   Final    Gram Stain     Final    Value: ABUNDANT WBC PRESENT,BOTH PMN AND MONONUCLEAR     NO ORGANISMS SEEN     Performed at [facility]   Culture     Final    Value: NO ANAEROBES ISOLATED; CULTURE IN PROGRESS FOR 5 DAYS   Report Status PENDING   Incomplete   GRAM STAIN     Status:  Normal   Collection Time   01/04/12  9:46 PM      Component Value Range Status Comment   Specimen Description ABSCESS BACK   Final    Special Requests PATIENT ON FOLLOWING LORTAZ, DIFLUCAN EPIDURAL   Final    Gram Stain     Final    Value: ABUNDANT WBC PRESENT,BOTH PMN AND MONONUCLEAR     NO ORGANISMS SEEN   Report  Status 01/04/2012 FINAL   Final   CULTURE, ROUTINE-ABSCESS     Status: Normal (Preliminary result)   Collection Time   01/07/12  4:14 PM      Component Value Range Status Comment   Specimen Description ABSCESS   Final    Special Requests PSOAS MUSCLE   Final    Gram Stain     Final    Value: MODERATE WBC PRESENT, PREDOMINANTLY PMN     NO SQUAMOUS EPITHELIAL CELLS SEEN     NO ORGANISMS SEEN   Culture NO GROWTH 1 DAY   Final    Report Status PENDING   Incomplete   CULTURE, ROUTINE-ABSCESS     Status: Normal (Preliminary result)   Collection Time   01/07/12  9:03 PM      Component Value Range Status Comment   Specimen Description ABSCESS EYE LEFT   Final    Special Requests INTRAOCULAR   Final    Gram Stain     Final    Value: ABUNDANT WBC PRESENT,BOTH PMN AND MONONUCLEAR     RARE GRAM POSITIVE COCCI IN PAIRS     Performed at [facility]   Culture PENDING   Incomplete    Report Status PENDING   Incomplete   ANAEROBIC CULTURE     Status: Normal (Preliminary result)   Collection Time   01/07/12  9:03 PM      Component Value Range Status Comment   Specimen Description ABSCESS EYE LEFT   Final    Special Requests INTRAOCULAR   Final    Gram Stain     Final    Value: ABUNDANT WBC PRESENT,BOTH PMN AND MONONUCLEAR     RARE GRAM POSITIVE COCCI IN PAIRS     Performed at [facility]   Culture PENDING   Incomplete    Report Status PENDING   Incomplete   GRAM STAIN     Status: Normal   Collection Time   01/07/12  9:03 PM      Component Value Range Status Comment   Specimen Description ABSCESS EYE LEFT   Final    Special Requests INTRAOCULAR   Final    Gram Stain      Final    Value: ABUNDANT WBC PRESENT,BOTH PMN AND MONONUCLEAR     RARE GRAM POSITIVE COCCI IN PAIRS   Report Status 01/07/2012 FINAL   Final     Studies/Results: Ct Guided Abscess Drain  01/07/2012  *RADIOLOGY REPORT*  Clinical Data: Epidural abscess, status post surgical drainage. Residual left paraspinous iliopsoas abscess  CT FLUOROSCOPY GUIDED LEFT ILIOPSOAS ABSCESS DRAINAGE  Date:  01/07/2012 14:20:00  Radiologist:  M. Ruel Favors, M.D.  Medications:  Versed and Fentanyl for conscious sedation  Guidance:  CT fluoroscopy  Fluoroscopy time:  19 seconds  Sedation time:  25 minutes  Contrast volume:  None.  Complications:  No immediate  PROCEDURE/FINDINGS:  Informed consent was obtained from the patient following explanation of the procedure, risks, benefits and alternatives. The patient understands, agrees and consents for the procedure. All questions were addressed.  A time out was performed.  Maximal barrier sterile technique utilized including caps, mask, sterile gowns, sterile gloves, large sterile drape, hand hygiene, and betadine  The previous imaging was reviewed.  The left iliopsoas abscess identified.  The patient was positioned left decubitus. Noncontrast localization CT performed.  The left iliopsoas abscess was localized.  Utilizing CT fluoroscopy.  a 17 gauge 11.8 cm access needle was advanced from a right  posterior paraspinous approach into the fluid collection.  Syringe aspiration yielded exudative fluid.  Samples sent for Gram stain and culture.  Guide wire advanced.  Tract dilatation performed to insert a 10-French drain.  Drainage catheter position confirmed in the abscess with CT.  Syringe aspiration yielded 20 ml exudative fluid.  Catheter secured with a Prolene suture and connects to external suction bulb.  Sterile dressing applied.  No immediate complication.  The patient tolerated the procedure well.  IMPRESSION: Successful CT fluoroscopy guided 10-French left iliopsoas abscess  drain insertion.  Sample sent for Gram stain culture.  Original Report Authenticated By: Judie Petit. Ruel Favors, M.D.   Mr Birdie Hopes Wo/w Cm  01/07/2012  *RADIOLOGY REPORT*  Clinical Data: Progressive left eye swelling.  MRI ORBITS WITHOUT AND WITH CONTRAST  Technique:  Multiplanar and multiecho pulse sequences of the orbits and surrounding structures were obtained including fat saturation techniques, before and after intravenous contrast administration.  Contrast:  5 ml MultiHance was utilized (half-dose in an attempt to limit contrast).  Comparison: 01/02/2012 MR.  01/01/2011 CT.  Findings: Progressive left-sided proptosis with diffuse inflammation of the globe and surrounding soft tissue.  Interval enlargement of the left extraocular muscles most notable left medial rectus (consistent with infectious involvement).  Peripheral extraconal thin fluid collection (maximal thickness 3.6 mm) most notable superiorly and laterally suggestive of an abscess. Retrobulbar fatty reticulation.  Significant elongation of the left optic nerve which demonstrates restricted motion suggesting infection and / or ischemia/infarction.  Preseptal soft tissue swelling extends along the left cheek.  No definitive findings of cavernous sinus thrombosis. Patency of the left superior ophthalmic vein cannot be confirmed.  No brain parenchymal enhancing lesion or abnormal meningeal enhancement.  IMPRESSION: Progressive left-sided proptosis with diffuse inflammation of the globe and surrounding soft tissue.  Peripheral left-sided extraconal thin fluid collection (maximal thickness 3.6 mm) most notable superiorly and laterally suggestive of an abscess.  Significant elongation of the left optic nerve which demonstrates restricted motion suggesting infection and / or ischemia/infarction.  No definitive findings of cavernous sinus thrombosis. Patency of the left superior ophthalmic vein cannot be confirmed.  No brain parenchymal enhancing lesion or  abnormal meningeal enhancement.  Images and cased discussed with Dr. Randon Goldsmith ophthalmology service.  Original Report Authenticated By: Fuller Canada, M.D.    Medications: I have reviewed the patient's current medications. Scheduled Meds:    . ceFAZolin  1 mL Subconjunctival Once  . ceFAZolin  200 mg Subconjunctival Once  . cefTAZidime (FORTAZ)  IV  1 g Intravenous Q8H  . cefTAZidime  100 mg Subconjunctival Once  . cefTAZidime  2.25 mg Intravitreal Once  . feeding supplement  237 mL Oral BID  . fentaNYL      . gadobenate dimeglumine  5 mL Intravenous Once  . midazolam      .  morphine injection  2 mg Intravenous Once  . pantoprazole  40 mg Oral BID AC  . potassium chloride  10 mEq Intravenous Q1 Hr x 5  . potassium chloride  10 mEq Intravenous Q1 Hr x 2  . sodium chloride  3 mL Intravenous Q12H  . vancomycin  750 mg Intravenous Q8H  . vancomycin  0.1 mg Intravitreal Once  . vancomycin  25 mg Subconjunctival Once  . DISCONTD: bacitracin-polymyxin b   Left Eye QID  . DISCONTD: enoxaparin (LOVENOX) injection  40 mg Subcutaneous Q24H  . DISCONTD: fluconazole (DIFLUCAN) IV  400 mg Intravenous Q24H  . DISCONTD: gatifloxacin  1 drop Left Eye  Q2H while awake   Continuous Infusions:    . sodium chloride 75 mL/hr at 01/08/12 0600   PRN Meds:.acetaminophen, acetaminophen, fentaNYL, HYDROcodone-acetaminophen, HYDROmorphone (DILAUDID) injection, midazolam, ondansetron (ZOFRAN) IV, ondansetron, DISCONTD: acetaminophen, DISCONTD: bacitracin, DISCONTD: bacitracin-polymyxin b, DISCONTD: balanced salts, DISCONTD: ceFAZolin, DISCONTD: EPINEPHrine, DISCONTD: HYDROmorphone, DISCONTD: isopropyl alcohol, DISCONTD: ondansetron (ZOFRAN) IV, DISCONTD: povidone-iodine  Assessment/Plan: Assessment:  36 female with chr low back pain and hx of hep B presents with 2 days hx of painless left eye blindness with hx of nausea. Vomiting an diarrhea 1 week back with findings of left anterior uveitis and multilobar  left sided pneumonia on CT scan.  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  abx coverage broadened with IV vanco and cefepime ( 3/3) . Also added fluconazole for fungal coverage on 3/4.  -Dr Randon Goldsmith performed vitrectomy on 3/4 with cx sent and will follow. 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   Hypokalemia repleat  Diarrhea  Prior to admission  c diff negative  resolved    DVT prophylaxis: restart lovenox SCD   Full code   Consult:  opthalmology :[place] and Dr Everardo Pacific  ID : [place]  Pulmonary :Sherene Sires  TO DO: Will arrange TEE for complete evaluation on Monday (by Corinda Gubler)     LOS: 9 days  Dallas Scorsone, DO 01/08/2012, 9:32 AM

## 2012-01-08 NOTE — Progress Notes (Signed)
INFECTIOUS DISEASE PROGRESS NOTE  ID: Donna Tapia is a 61 y.o. 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 Having more back pain this AM.   Abtx:  Anti-infectives     Start     Dose/Rate Route Frequency Ordered Stop   01/07/12 2137   ceFAZolin (ANCEF) injection  Status:  Discontinued          As needed 01/07/12 2138 01/07/12 2217   01/07/12 1930   ceFAZolin (ANCEF) 200 mg/mL injection 200 mg        1 mL Subconjunctival  Once 01/07/12 1924     01/07/12 1930   ceFAZolin (ANCEF) 200 mg/mL injection 200 mg        200 mg Subconjunctival  Once 01/07/12 1926     01/06/12 0200   vancomycin (VANCOCIN) 750 mg in sodium chloride 0.9 % 150 mL IVPB        750 mg 150 mL/hr over 60 Minutes Intravenous Every 8 hours 01/05/12 1952     01/05/12 1300   cefTAZidime (FORTAZ) ophth injection 100 mg        100 mg Subconjunctival  Once 01/05/12 1247 01/05/12 1443   01/05/12 1300   vancomycin (VANCOCIN) ophth injection 25 mg        25 mg Subconjunctival  Once 01/05/12 1247 01/05/12 1446   01/05/12 1300   vancomycin (VANCOCIN) injection INJ 1 mg        1 mg Subconjunctival  Once 01/05/12 1247 01/05/12 1446   01/05/12 1200   cefTAZidime (FORTAZ) ophth injection 2.25 mg        2.25 mg Intravitreal  Once 01/05/12 1053 01/05/12 1444   01/04/12 2134   50,000 units bacitracin in 0.9% normal saline 250 mL irrigation  Status:  Discontinued          As needed 01/04/12 2145 01/04/12 2247   01/04/12 2100   bacitracin 96045 UNITS injection     Comments: AURAND, BRANDI: cabinet override         01/04/12 2100 01/05/12 0859   01/04/12 2100   ceFAZolin (ANCEF) 1-5 GM-% IVPB     Comments: AURAND, BRANDI: cabinet override      01/04/12 2100 01/05/12 0859   01/04/12 1830   fluconazole (DIFLUCAN) IVPB 400 mg  Status:  Discontinued        400 mg 200 mL/hr over 60 Minutes Intravenous Every 24 hours 01/03/12 1827 01/07/12 1616   01/03/12 1930   fluconazole (DIFLUCAN) IVPB 800 mg        800 mg 400 mL/hr over 60 Minutes Intravenous  Once 01/03/12 1826 01/03/12 2139   01/03/12 1245   cefTAZidime (FORTAZ) ophth injection 2.25 mg        2.25 mg Intravitreal  Once 01/03/12 1135     01/03/12 1245   cefTAZidime (FORTAZ) ophth injection 100 mg        100 mg Subconjunctival  Once 01/03/12 1135     01/03/12 1200   vancomycin (VANCOCIN) ophth injection 0.1 mg        0.1 mg Intravitreal  Once 01/03/12 1130     01/03/12 1200   vancomycin (VANCOCIN) ophth injection 25 mg        25 mg Subconjunctival  Once 01/03/12 1131     01/03/12 0500   vancomycin (VANCOCIN) 750 mg in sodium chloride 0.9 % 150 mL IVPB  Status:  Discontinued        750 mg 150 mL/hr over 60 Minutes Intravenous Every 12 hours 01/02/12 1716 01/05/12 1953   01/03/12 0200   ceFEPIme (MAXIPIME) 2 g in dextrose 5 % 50 mL IVPB  Status:  Discontinued        2 g 100 mL/hr over 30 Minutes Intravenous Every 8 hours 01/02/12 1716 01/02/12 2041   01/02/12 2200   cefTAZidime (FORTAZ) 1 g in dextrose 5 % 50 mL IVPB        1 g 100 mL/hr over 30 Minutes Intravenous 3 times per day 01/02/12 2106     01/02/12 1400   ceFEPIme (MAXIPIME) 2 g in dextrose 5 % 50 mL IVPB  Status:  Discontinued        2 g 100 mL/hr over 30 Minutes Intravenous 3 times per day 01/02/12 1318 01/02/12 1716   01/02/12 1400   vancomycin (VANCOCIN) 750 mg in sodium chloride 0.9 % 150 mL IVPB  Status:  Discontinued        750 mg 150 mL/hr over 60 Minutes Intravenous Every 12 hours 01/02/12 1350 01/02/12 1716   12/31/11 1530   valACYclovir (VALTREX) tablet 2,000 mg        2,000 mg Oral 2 times daily 12/31/11 1358 12/31/11 2235   12/31/11 0100   moxifloxacin (AVELOX) IVPB 400 mg  Status:   Discontinued        400 mg 250 mL/hr over 60 Minutes Intravenous Every 24 hours 12/31/11 0040 01/03/12 1913          Medications:  Scheduled:   . artificial tears   Left Eye Q6H WA  . bacitracin-polymyxin b   Left Eye Q6H WA  . ceFAZolin  1 mL Subconjunctival Once  . ceFAZolin  200 mg Subconjunctival Once  . cefTAZidime (FORTAZ)  IV  1 g Intravenous Q8H  . cefTAZidime  100 mg Subconjunctival Once  . cefTAZidime  2.25 mg Intravitreal Once  . feeding supplement  237 mL Oral BID  . fentaNYL      . gadobenate dimeglumine  5 mL Intravenous Once  . midazolam      .  morphine injection  2 mg Intravenous Once  . pantoprazole  40 mg Oral BID AC  . potassium chloride  10 mEq Intravenous Q1 Hr x 5  . potassium chloride  10 mEq Intravenous Q1 Hr x 2  . sodium chloride  3 mL Intravenous Q12H  . vancomycin  750 mg Intravenous Q8H  . vancomycin  0.1 mg Intravitreal Once  . vancomycin  25 mg Subconjunctival Once  . DISCONTD: artificial tears   Left Eye Q6H WA  . DISCONTD: bacitracin-polymyxin b   Left Eye QID  . DISCONTD: enoxaparin (LOVENOX) injection  40 mg Subcutaneous Q24H  . DISCONTD: fluconazole (DIFLUCAN) IV  400 mg Intravenous Q24H  . DISCONTD: gatifloxacin  1 drop Left Eye Q2H while awake    Objective: Vital signs in last 24 hours: Temp:  [98.2 F (36.8 C)-99.8 F (37.7 C)] 98.7 F (37.1 C) (03/09 0607) Pulse Rate:  [75-98] 77  (03/09 0607) Resp:  [14-25] 14  (03/09 0607) BP: (113-163)/(51-76) 132/76 mmHg (03/09 0607) SpO2:  [91 %-100 %] 96 % (03/09 0607) Weight:  [63.6 kg (140 lb 3.4 oz)] 63.6 kg (140 lb 3.4 oz) (03/09 1610)   General appearance:  alert, cooperative and no distress Eyes: patch over L eye socket Resp: clear to auscultation bilaterally Cardio: regular rate and rhythm GI: normal findings: bowel sounds normal and soft, non-tender  Lab Results  Basename 01/08/12 0500 01/07/12 0642  WBC 9.8 12.8*  HGB 9.3* 10.8*  HCT 28.1* 32.0*  NA 136 134*  K 3.2*  2.9*  CL 101 99  CO2 28 28  BUN 3* 4*  CREATININE 0.47* 0.52  GLU -- --   Liver Panel No results found for this basename: PROT:2,ALBUMIN:2,AST:2,ALT:2,ALKPHOS:2,BILITOT:2,BILIDIR:2,IBILI:2 in the last 72 hours Sedimentation Rate No results found for this basename: ESRSEDRATE in the last 72 hours C-Reactive Protein No results found for this basename: CRP:2 in the last 72 hours  Microbiology: Recent Results (from the past 240 hour(s))  URINE CULTURE     Status: Normal   Collection Time   12/31/11  4:15 AM      Component Value Range Status Comment   Specimen Description URINE, CLEAN CATCH   Final    Special Requests NONE   Final    Culture  Setup Time [id number]   Final    Colony Count NO GROWTH   Final    Culture NO GROWTH   Final    Report Status 01/01/2012 FINAL   Final   CULTURE, BLOOD (ROUTINE X 2)     Status: Normal   Collection Time   12/31/11  8:42 AM      Component Value Range Status Comment   Specimen Description BLOOD LEFT ARM   Final    Special Requests BOTTLES DRAWN AEROBIC ONLY 10CC   Final    Culture  Setup Time [id number]   Final    Culture NO GROWTH 5 DAYS   Final    Report Status 01/06/2012 FINAL   Final   CULTURE, BLOOD (ROUTINE X 2)     Status: Normal   Collection Time   12/31/11  8:48 AM      Component Value Range Status Comment   Specimen Description BLOOD LEFT HAND   Final    Special Requests BOTTLES DRAWN AEROBIC ONLY 10CC   Final    Culture  Setup Time [id number]   Final    Culture NO GROWTH 5 DAYS   Final    Report Status 01/06/2012 FINAL   Final   CULTURE, SPUTUM-ASSESSMENT     Status: Normal   Collection Time   12/31/11  2:15 PM      Component Value Range Status Comment   Specimen Description SPUTUM   Final    Special Requests NONE   Final    Sputum evaluation     Final    Value: THIS SPECIMEN IS ACCEPTABLE. RESPIRATORY CULTURE REPORT TO FOLLOW.   Report Status 12/31/2011 FINAL   Final   CULTURE, RESPIRATORY     Status: Normal   Collection  Time   12/31/11  2:15 PM      Component Value Range Status Comment   Specimen Description SPUTUM   Final    Special Requests NONE   Final    Gram Stain     Final    Value: NO WBC SEEN     NO SQUAMOUS EPITHELIAL CELLS SEEN     FEW YEAST   Culture NORMAL OROPHARYNGEAL FLORA   Final    Report Status 01/03/2012 FINAL   Final   STOOL CULTURE     Status: Normal   Collection Time   12/31/11  2:45 PM  Component Value Range Status Comment   Specimen Description STOOL   Final    Special Requests NONE   Final    Culture     Final    Value: NO SALMONELLA, SHIGELLA, CAMPYLOBACTER, OR YERSINIA ISOLATED   Report Status 01/04/2012 FINAL   Final   CLOSTRIDIUM DIFFICILE BY PCR     Status: Normal   Collection Time   01/02/12 11:29 AM      Component Value Range Status Comment   C difficile by pcr NEGATIVE  NEGATIVE  Final   EYE CULTURE     Status: Normal (Preliminary result)   Collection Time   01/03/12  1:45 PM      Component Value Range Status Comment   Specimen Description EYE LEFT   Final    Special Requests     Final    Value: VTREOUS PT ON VANC CEFTAZ DO SENS TO ALL GROWTH INCLUDING COAG NEG STAPH PER MD   Culture NO GROWTH 4 DAYS   Final    Report Status PENDING   Incomplete   EYE CULTURE     Status: Normal (Preliminary result)   Collection Time   01/03/12  1:45 PM      Component Value Range Status Comment   Specimen Description EYE LEFT   Final    Special Requests     Final    Value: ANTEROR CHAMBER PT ON VANC CEFTAZ DO SENS TO ALL GROWTH INCLUDING COAG NEG STAPH PER MD   Culture NO GROWTH 4 DAYS   Final    Report Status PENDING   Incomplete   CULTURE, BLOOD (ROUTINE X 2)     Status: Normal (Preliminary result)   Collection Time   01/03/12  8:32 PM      Component Value Range Status Comment   Specimen Description BLOOD RIGHT HAND   Final    Special Requests BOTTLES DRAWN AEROBIC ONLY 10CC   Final    Culture  Setup Time [id number]   Final    Culture     Final    Value:        BLOOD  CULTURE RECEIVED NO GROWTH TO DATE CULTURE WILL BE HELD FOR 5 DAYS BEFORE ISSUING A FINAL NEGATIVE REPORT   Report Status PENDING   Incomplete   CULTURE, BLOOD (ROUTINE X 2)     Status: Normal (Preliminary result)   Collection Time   01/03/12  8:39 PM      Component Value Range Status Comment   Specimen Description BLOOD RIGHT FOREARM   Final    Special Requests BOTTLES DRAWN AEROBIC ONLY 10CC   Final    Culture  Setup Time [id number]   Final    Culture     Final    Value:        BLOOD CULTURE RECEIVED NO GROWTH TO DATE CULTURE WILL BE HELD FOR 5 DAYS BEFORE ISSUING A FINAL NEGATIVE REPORT   Report Status PENDING   Incomplete   CULTURE, SPUTUM-ASSESSMENT     Status: Normal   Collection Time   01/04/12  6:48 AM      Component Value Range Status Comment   Specimen Description SPUTUM   Final    Special Requests NONE   Final    Sputum evaluation     Final    Value: MICROSCOPIC FINDINGS SUGGEST THAT THIS SPECIMEN IS NOT REPRESENTATIVE OF LOWER RESPIRATORY SECRETIONS. PLEASE RECOLLECT.     CALLED TO [facility] RN 01/04/12 0800 COSTELLO B   Report  Status 01/04/2012 FINAL   Final   MRSA PCR SCREENING     Status: Normal   Collection Time   01/04/12  4:20 PM      Component Value Range Status Comment   MRSA by PCR NEGATIVE  NEGATIVE  Final   CULTURE, ROUTINE-ABSCESS     Status: Normal   Collection Time   01/04/12  9:46 PM      Component Value Range Status Comment   Specimen Description ABSCESS BACK   Final    Special Requests PATIENT ON FOLLOWING LORTAZ, DIFLUCAN EPIDURAL   Final    Gram Stain     Final    Value: ABUNDANT WBC PRESENT,BOTH PMN AND MONONUCLEAR     NO ORGANISMS SEEN   Culture NO GROWTH 3 DAYS   Final    Report Status 01/08/2012 FINAL   Final   ANAEROBIC CULTURE     Status: Normal (Preliminary result)   Collection Time   01/04/12  9:46 PM      Component Value Range Status Comment   Specimen Description ABSCESS BACK   Final    Special Requests PATIENT ON FOLLOWING LORTAZ, DIFLUCAN  EPIDURAL   Final    Gram Stain     Final    Value: ABUNDANT WBC PRESENT,BOTH PMN AND MONONUCLEAR     NO ORGANISMS SEEN     Performed at [facility]   Culture     Final    Value: NO ANAEROBES ISOLATED; CULTURE IN PROGRESS FOR 5 DAYS   Report Status PENDING   Incomplete   GRAM STAIN     Status: Normal   Collection Time   01/04/12  9:46 PM      Component Value Range Status Comment   Specimen Description ABSCESS BACK   Final    Special Requests PATIENT ON FOLLOWING LORTAZ, DIFLUCAN EPIDURAL   Final    Gram Stain     Final    Value: ABUNDANT WBC PRESENT,BOTH PMN AND MONONUCLEAR     NO ORGANISMS SEEN   Report Status 01/04/2012 FINAL   Final   CULTURE, ROUTINE-ABSCESS     Status: Normal (Preliminary result)   Collection Time   01/07/12  4:14 PM      Component Value Range Status Comment   Specimen Description ABSCESS   Final    Special Requests PSOAS MUSCLE   Final    Gram Stain     Final    Value: MODERATE WBC PRESENT, PREDOMINANTLY PMN     NO SQUAMOUS EPITHELIAL CELLS SEEN     NO ORGANISMS SEEN   Culture NO GROWTH 1 DAY   Final    Report Status PENDING   Incomplete   CULTURE, ROUTINE-ABSCESS     Status: Normal (Preliminary result)   Collection Time   01/07/12  9:03 PM      Component Value Range Status Comment   Specimen Description ABSCESS EYE LEFT   Final    Special Requests INTRAOCULAR   Final    Gram Stain     Final    Value: ABUNDANT WBC PRESENT,BOTH PMN AND MONONUCLEAR     RARE GRAM POSITIVE COCCI IN PAIRS     Performed at [facility]   Culture PENDING   Incomplete    Report Status PENDING   Incomplete   ANAEROBIC CULTURE     Status: Normal (Preliminary result)   Collection Time   01/07/12  9:03 PM      Component Value Range Status Comment  Specimen Description ABSCESS EYE LEFT   Final    Special Requests INTRAOCULAR   Final    Gram Stain     Final    Value: ABUNDANT WBC PRESENT,BOTH PMN AND MONONUCLEAR     RARE GRAM POSITIVE COCCI IN PAIRS     Performed at  [facility]   Culture PENDING   Incomplete    Report Status PENDING   Incomplete   GRAM STAIN     Status: Normal   Collection Time   01/07/12  9:03 PM      Component Value Range Status Comment   Specimen Description ABSCESS EYE LEFT   Final    Special Requests INTRAOCULAR   Final    Gram Stain     Final    Value: ABUNDANT WBC PRESENT,BOTH PMN AND MONONUCLEAR     RARE GRAM POSITIVE COCCI IN PAIRS   Report Status 01/07/2012 FINAL   Final     Studies/Results: Ct Guided Abscess Drain  01/07/2012  *RADIOLOGY REPORT*  Clinical Data: Epidural abscess, status post surgical drainage. Residual left paraspinous iliopsoas abscess  CT FLUOROSCOPY GUIDED LEFT ILIOPSOAS ABSCESS DRAINAGE  Date:  01/07/2012 14:20:00  Radiologist:  M. Ruel Favors, M.D.  Medications:  Versed and Fentanyl for conscious sedation  Guidance:  CT fluoroscopy  Fluoroscopy time:  19 seconds  Sedation time:  25 minutes  Contrast volume:  None.  Complications:  No immediate  PROCEDURE/FINDINGS:  Informed consent was obtained from the patient following explanation of the procedure, risks, benefits and alternatives. The patient understands, agrees and consents for the procedure. All questions were addressed.  A time out was performed.  Maximal barrier sterile technique utilized including caps, mask, sterile gowns, sterile gloves, large sterile drape, hand hygiene, and betadine  The previous imaging was reviewed.  The left iliopsoas abscess identified.  The patient was positioned left decubitus. Noncontrast localization CT performed.  The left iliopsoas abscess was localized.  Utilizing CT fluoroscopy.  a 17 gauge 11.8 cm access needle was advanced from a right posterior paraspinous approach into the fluid collection.  Syringe aspiration yielded exudative fluid.  Samples sent for Gram stain and culture.  Guide wire advanced.  Tract dilatation performed to insert a 10-French drain.  Drainage catheter position confirmed in the abscess with CT.   Syringe aspiration yielded 20 ml exudative fluid.  Catheter secured with a Prolene suture and connects to external suction bulb.  Sterile dressing applied.  No immediate complication.  The patient tolerated the procedure well.  IMPRESSION: Successful CT fluoroscopy guided 10-French left iliopsoas abscess drain insertion.  Sample sent for Gram stain culture.  Original Report Authenticated By: Judie Petit. Ruel Favors, M.D.   Mr Birdie Hopes Wo/w Cm  01/07/2012  *RADIOLOGY REPORT*  Clinical Data: Progressive left eye swelling.  MRI ORBITS WITHOUT AND WITH CONTRAST  Technique:  Multiplanar and multiecho pulse sequences of the orbits and surrounding structures were obtained including fat saturation techniques, before and after intravenous contrast administration.  Contrast:  5 ml MultiHance was utilized (half-dose in an attempt to limit contrast).  Comparison: 01/02/2012 MR.  01/01/2011 CT.  Findings: Progressive left-sided proptosis with diffuse inflammation of the globe and surrounding soft tissue.  Interval enlargement of the left extraocular muscles most notable left medial rectus (consistent with infectious involvement).  Peripheral extraconal thin fluid collection (maximal thickness 3.6 mm) most notable superiorly and laterally suggestive of an abscess. Retrobulbar fatty reticulation.  Significant elongation of the left optic nerve which demonstrates restricted motion suggesting infection  and / or ischemia/infarction.  Preseptal soft tissue swelling extends along the left cheek.  No definitive findings of cavernous sinus thrombosis. Patency of the left superior ophthalmic vein cannot be confirmed.  No brain parenchymal enhancing lesion or abnormal meningeal enhancement.  IMPRESSION: Progressive left-sided proptosis with diffuse inflammation of the globe and surrounding soft tissue.  Peripheral left-sided extraconal thin fluid collection (maximal thickness 3.6 mm) most notable superiorly and laterally suggestive of an abscess.   Significant elongation of the left optic nerve which demonstrates restricted motion suggesting infection and / or ischemia/infarction.  No definitive findings of cavernous sinus thrombosis. Patency of the left superior ophthalmic vein cannot be confirmed.  No brain parenchymal enhancing lesion or abnormal meningeal enhancement.  Images and cased discussed with Dr. Randon Goldsmith ophthalmology service.  Original Report Authenticated By: Fuller Canada, M.D.     Assessment/Plan: Day 10 anbx Pan-ophthalmitis Epidural, Psoas abscess Pneumonia Multiple Cx's negative. Will continue her current anbx (vanco/ceftaz) for now.  HIV/Hep B,C/FTA all (-)  Johny Sax Infectious Diseases [phone] 01/08/2012, 1:23 PM

## 2012-01-09 ENCOUNTER — Inpatient Hospital Stay (HOSPITAL_COMMUNITY): Payer: Medicaid Other

## 2012-01-09 LAB — BASIC METABOLIC PANEL
BUN: 3 mg/dL — ABNORMAL LOW (ref 6–23)
Chloride: 102 mEq/L (ref 96–112)
GFR calc Af Amer: >90 mL/min (ref >90)
GFR calc non Af Amer: >90 mL/min (ref >90)
Potassium: 3.2 mEq/L — ABNORMAL LOW (ref 3.5–5.1)
Sodium: 137 mEq/L (ref 135–145)

## 2012-01-09 LAB — ANAEROBIC CULTURE

## 2012-01-09 LAB — CBC
HCT: 29.7 % — ABNORMAL LOW (ref 36.0–46.0)
Hemoglobin: 9.7 g/dL — ABNORMAL LOW (ref 12.0–15.0)
MCHC: 32.7 g/dL (ref 30.0–36.0)
RDW: 15.4 % (ref 11.5–15.5)
WBC: 7.8 10*3/uL (ref 4.0–10.5)

## 2012-01-09 MED ORDER — IOHEXOL 300 MG/ML  SOLN
75.0000 mL | Freq: Once | INTRAMUSCULAR | Status: AC | PRN
  Administered 2012-01-09: 75 mL via INTRAVENOUS

## 2012-01-09 MED ORDER — POTASSIUM CHLORIDE CRYS ER 20 MEQ PO TBCR
EXTENDED_RELEASE_TABLET | ORAL | Status: AC
  Administered 2012-01-09: 40 meq via ORAL
  Filled 2012-01-09: qty 2

## 2012-01-09 MED ORDER — POTASSIUM CHLORIDE CRYS ER 20 MEQ PO TBCR
40.0000 meq | EXTENDED_RELEASE_TABLET | Freq: Once | ORAL | Status: AC
  Administered 2012-01-09: 40 meq via ORAL

## 2012-01-09 MED ORDER — ENOXAPARIN SODIUM 40 MG/0.4ML ~~LOC~~ SOLN
40.0000 mg | SUBCUTANEOUS | Status: DC
  Administered 2012-01-10 – 2012-01-19 (×10): 40 mg via SUBCUTANEOUS
  Filled 2012-01-09 (×11): qty 0.4

## 2012-01-09 NOTE — Progress Notes (Signed)
Subjective:  Back from CT scan Pain controlled No fever, no chills, no SOB, no CP  Objective: Vital signs in last 24 hours: Filed Vitals:   01/08/12 0607 01/08/12 1400 01/08/12 2130 01/09/12 0553  BP: 132/76 113/64 151/71 126/70  Pulse: 77 81 88 84  Temp: 98.7 F (37.1 C) 98.1 F (36.7 C) 98.6 F (37 C) 98.1 F (36.7 C)  TempSrc: Oral Oral Oral Oral  Resp: 14 16 16 17   Height: 5\' 9"  (1.753 m)     Weight: 63.6 kg (140 lb 3.4 oz)   67.6 kg (149 lb 0.5 oz)  SpO2: 96% 93% 94% 92%   Weight change: 4 kg (8 lb 13.1 oz)  Intake/Output Summary (Last 24 hours) at 01/09/12 0904 Last data filed at 01/09/12 0600  Gross per 24 hour  Intake 2086.75 ml  Output   2610 ml  Net -523.25 ml    Physical Exam: General: Awake, Oriented, appears in pain HEENT: L eye removed , covered Neck: Supple CV: S1 and S2, rrr Lungs: clear ant Abdomen: Soft, Nontender, Nondistended, +bowel sounds. Ext: Good pulses. No edema., weakness B/L   Lab Results:  Basename 01/09/12 0610 01/08/12 0500  NA 137 136  K 3.2* 3.2*  CL 102 101  CO2 29 28  GLUCOSE 90 86  BUN 3* 3*  CREATININE 0.53 0.47*  CALCIUM 7.3* 7.1*  MG -- --  PHOS -- --   No results found for this basename: AST:2,ALT:2,ALKPHOS:2,BILITOT:2,PROT:2,ALBUMIN:2 in the last 72 hours No results found for this basename: LIPASE:2,AMYLASE:2 in the last 72 hours  Basename 01/09/12 0610 01/08/12 0500  WBC 7.8 9.8  NEUTROABS -- --  HGB 9.7* 9.3*  HCT 29.7* 28.1*  MCV 93.4 92.1  PLT 654* 662*   No results found for this basename: CKTOTAL:3,CKMB:3,CKMBINDEX:3,TROPONINI:3 in the last 72 hours No components found with this basename: POCBNP:3 No results found for this basename: DDIMER:2 in the last 72 hours No results found for this basename: HGBA1C:2 in the last 72 hours No results found for this basename: CHOL:2,HDL:2,LDLCALC:2,TRIG:2,CHOLHDL:2,LDLDIRECT:2 in the last 72 hours No results found for this basename:  TSH,T4TOTAL,FREET3,T3FREE,THYROIDAB in the last 72 hours No results found for this basename: VITAMINB12:2,FOLATE:2,FERRITIN:2,TIBC:2,IRON:2,RETICCTPCT:2 in the last 72 hours  Micro Results: Recent Results (from the past 240 hour(s))  URINE CULTURE     Status: Normal   Collection Time   12/31/11  4:15 AM      Component Value Range Status Comment   Specimen Description URINE, CLEAN CATCH   Final    Special Requests NONE   Final    Culture  Setup Time [id number]   Final    Colony Count NO GROWTH   Final    Culture NO GROWTH   Final    Report Status 01/01/2012 FINAL   Final   CULTURE, BLOOD (ROUTINE X 2)     Status: Normal   Collection Time   12/31/11  8:42 AM      Component Value Range Status Comment   Specimen Description BLOOD LEFT ARM   Final    Special Requests BOTTLES DRAWN AEROBIC ONLY 10CC   Final    Culture  Setup Time [id number]   Final    Culture NO GROWTH 5 DAYS   Final    Report Status 01/06/2012 FINAL   Final   CULTURE, BLOOD (ROUTINE X 2)     Status: Normal   Collection Time   12/31/11  8:48 AM      Component Value Range Status Comment  Specimen Description BLOOD LEFT HAND   Final    Special Requests BOTTLES DRAWN AEROBIC ONLY 10CC   Final    Culture  Setup Time [id number]   Final    Culture NO GROWTH 5 DAYS   Final    Report Status 01/06/2012 FINAL   Final   CULTURE, SPUTUM-ASSESSMENT     Status: Normal   Collection Time   12/31/11  2:15 PM      Component Value Range Status Comment   Specimen Description SPUTUM   Final    Special Requests NONE   Final    Sputum evaluation     Final    Value: THIS SPECIMEN IS ACCEPTABLE. RESPIRATORY CULTURE REPORT TO FOLLOW.   Report Status 12/31/2011 FINAL   Final   CULTURE, RESPIRATORY     Status: Normal   Collection Time   12/31/11  2:15 PM      Component Value Range Status Comment   Specimen Description SPUTUM   Final    Special Requests NONE   Final    Gram Stain     Final    Value: NO WBC SEEN     NO SQUAMOUS EPITHELIAL  CELLS SEEN     FEW YEAST   Culture NORMAL OROPHARYNGEAL FLORA   Final    Report Status 01/03/2012 FINAL   Final   STOOL CULTURE     Status: Normal   Collection Time   12/31/11  2:45 PM      Component Value Range Status Comment   Specimen Description STOOL   Final    Special Requests NONE   Final    Culture     Final    Value: NO SALMONELLA, SHIGELLA, CAMPYLOBACTER, OR YERSINIA ISOLATED   Report Status 01/04/2012 FINAL   Final   CLOSTRIDIUM DIFFICILE BY PCR     Status: Normal   Collection Time   01/02/12 11:29 AM      Component Value Range Status Comment   C difficile by pcr NEGATIVE  NEGATIVE  Final   EYE CULTURE     Status: Normal (Preliminary result)   Collection Time   01/03/12  1:45 PM      Component Value Range Status Comment   Specimen Description EYE LEFT   Final    Special Requests     Final    Value: VTREOUS PT ON VANC CEFTAZ DO SENS TO ALL GROWTH INCLUDING COAG NEG STAPH PER MD   Culture NO GROWTH 4 DAYS   Final    Report Status PENDING   Incomplete   EYE CULTURE     Status: Normal (Preliminary result)   Collection Time   01/03/12  1:45 PM      Component Value Range Status Comment   Specimen Description EYE LEFT   Final    Special Requests     Final    Value: ANTEROR CHAMBER PT ON VANC CEFTAZ DO SENS TO ALL GROWTH INCLUDING COAG NEG STAPH PER MD   Culture NO GROWTH 4 DAYS   Final    Report Status PENDING   Incomplete   CULTURE, BLOOD (ROUTINE X 2)     Status: Normal (Preliminary result)   Collection Time   01/03/12  8:32 PM      Component Value Range Status Comment   Specimen Description BLOOD RIGHT HAND   Final    Special Requests BOTTLES DRAWN AEROBIC ONLY 10CC   Final    Culture  Setup Time [id number]   Final  Culture     Final    Value:        BLOOD CULTURE RECEIVED NO GROWTH TO DATE CULTURE WILL BE HELD FOR 5 DAYS BEFORE ISSUING A FINAL NEGATIVE REPORT   Report Status PENDING   Incomplete   CULTURE, BLOOD (ROUTINE X 2)     Status: Normal (Preliminary result)    Collection Time   01/03/12  8:39 PM      Component Value Range Status Comment   Specimen Description BLOOD RIGHT FOREARM   Final    Special Requests BOTTLES DRAWN AEROBIC ONLY 10CC   Final    Culture  Setup Time [id number]   Final    Culture     Final    Value:        BLOOD CULTURE RECEIVED NO GROWTH TO DATE CULTURE WILL BE HELD FOR 5 DAYS BEFORE ISSUING A FINAL NEGATIVE REPORT   Report Status PENDING   Incomplete   CULTURE, SPUTUM-ASSESSMENT     Status: Normal   Collection Time   01/04/12  6:48 AM      Component Value Range Status Comment   Specimen Description SPUTUM   Final    Special Requests NONE   Final    Sputum evaluation     Final    Value: MICROSCOPIC FINDINGS SUGGEST THAT THIS SPECIMEN IS NOT REPRESENTATIVE OF LOWER RESPIRATORY SECRETIONS. PLEASE RECOLLECT.     CALLED TO [facility] RN 01/04/12 0800 COSTELLO B   Report Status 01/04/2012 FINAL   Final   MRSA PCR SCREENING     Status: Normal   Collection Time   01/04/12  4:20 PM      Component Value Range Status Comment   MRSA by PCR NEGATIVE  NEGATIVE  Final   CULTURE, ROUTINE-ABSCESS     Status: Normal   Collection Time   01/04/12  9:46 PM      Component Value Range Status Comment   Specimen Description ABSCESS BACK   Final    Special Requests PATIENT ON FOLLOWING LORTAZ, DIFLUCAN EPIDURAL   Final    Gram Stain     Final    Value: ABUNDANT WBC PRESENT,BOTH PMN AND MONONUCLEAR     NO ORGANISMS SEEN   Culture NO GROWTH 3 DAYS   Final    Report Status 01/08/2012 FINAL   Final   ANAEROBIC CULTURE     Status: Normal (Preliminary result)   Collection Time   01/04/12  9:46 PM      Component Value Range Status Comment   Specimen Description ABSCESS BACK   Final    Special Requests PATIENT ON FOLLOWING LORTAZ, DIFLUCAN EPIDURAL   Final    Gram Stain     Final    Value: ABUNDANT WBC PRESENT,BOTH PMN AND MONONUCLEAR     NO ORGANISMS SEEN     Performed at [facility]   Culture     Final    Value: NO ANAEROBES ISOLATED; CULTURE IN  PROGRESS FOR 5 DAYS   Report Status PENDING   Incomplete   GRAM STAIN     Status: Normal   Collection Time   01/04/12  9:46 PM      Component Value Range Status Comment   Specimen Description ABSCESS BACK   Final    Special Requests PATIENT ON FOLLOWING LORTAZ, DIFLUCAN EPIDURAL   Final    Gram Stain     Final    Value: ABUNDANT WBC PRESENT,BOTH PMN AND MONONUCLEAR     NO  ORGANISMS SEEN   Report Status 01/04/2012 FINAL   Final   CULTURE, ROUTINE-ABSCESS     Status: Normal (Preliminary result)   Collection Time   01/07/12  4:14 PM      Component Value Range Status Comment   Specimen Description ABSCESS   Final    Special Requests PSOAS MUSCLE   Final    Gram Stain     Final    Value: MODERATE WBC PRESENT, PREDOMINANTLY PMN     NO SQUAMOUS EPITHELIAL CELLS SEEN     NO ORGANISMS SEEN   Culture NO GROWTH 2 DAYS   Final    Report Status PENDING   Incomplete   CULTURE, ROUTINE-ABSCESS     Status: Normal (Preliminary result)   Collection Time   01/07/12  9:03 PM      Component Value Range Status Comment   Specimen Description ABSCESS EYE LEFT   Final    Special Requests INTRAOCULAR   Final    Gram Stain     Final    Value: ABUNDANT WBC PRESENT,BOTH PMN AND MONONUCLEAR     RARE GRAM POSITIVE COCCI IN PAIRS     Performed at [facility]   Culture NO GROWTH 1 DAY   Final    Report Status PENDING   Incomplete   ANAEROBIC CULTURE     Status: Normal (Preliminary result)   Collection Time   01/07/12  9:03 PM      Component Value Range Status Comment   Specimen Description ABSCESS EYE LEFT   Final    Special Requests INTRAOCULAR   Final    Gram Stain     Final    Value: ABUNDANT WBC PRESENT,BOTH PMN AND MONONUCLEAR     RARE GRAM POSITIVE COCCI IN PAIRS     Performed at [facility]   Culture     Final    Value: NO ANAEROBES ISOLATED; CULTURE IN PROGRESS FOR 5 DAYS   Report Status PENDING   Incomplete   GRAM STAIN     Status: Normal   Collection Time   01/07/12  9:03 PM       Component Value Range Status Comment   Specimen Description ABSCESS EYE LEFT   Final    Special Requests INTRAOCULAR   Final    Gram Stain     Final    Value: ABUNDANT WBC PRESENT,BOTH PMN AND MONONUCLEAR     RARE GRAM POSITIVE COCCI IN PAIRS   Report Status 01/07/2012 FINAL   Final     Studies/Results: Ct Guided Abscess Drain  01/07/2012  *RADIOLOGY REPORT*  Clinical Data: Epidural abscess, status post surgical drainage. Residual left paraspinous iliopsoas abscess  CT FLUOROSCOPY GUIDED LEFT ILIOPSOAS ABSCESS DRAINAGE  Date:  01/07/2012 14:20:00  Radiologist:  M. Ruel Favors, M.D.  Medications:  Versed and Fentanyl for conscious sedation  Guidance:  CT fluoroscopy  Fluoroscopy time:  19 seconds  Sedation time:  25 minutes  Contrast volume:  None.  Complications:  No immediate  PROCEDURE/FINDINGS:  Informed consent was obtained from the patient following explanation of the procedure, risks, benefits and alternatives. The patient understands, agrees and consents for the procedure. All questions were addressed.  A time out was performed.  Maximal barrier sterile technique utilized including caps, mask, sterile gowns, sterile gloves, large sterile drape, hand hygiene, and betadine  The previous imaging was reviewed.  The left iliopsoas abscess identified.  The patient was positioned left decubitus. Noncontrast localization CT performed.  The left  iliopsoas abscess was localized.  Utilizing CT fluoroscopy.  a 17 gauge 11.8 cm access needle was advanced from a right posterior paraspinous approach into the fluid collection.  Syringe aspiration yielded exudative fluid.  Samples sent for Gram stain and culture.  Guide wire advanced.  Tract dilatation performed to insert a 10-French drain.  Drainage catheter position confirmed in the abscess with CT.  Syringe aspiration yielded 20 ml exudative fluid.  Catheter secured with a Prolene suture and connects to external suction bulb.  Sterile dressing applied.  No  immediate complication.  The patient tolerated the procedure well.  IMPRESSION: Successful CT fluoroscopy guided 10-French left iliopsoas abscess drain insertion.  Sample sent for Gram stain culture.  Original Report Authenticated By: Judie Petit. Ruel Favors, M.D.   Mr Birdie Hopes Wo/w Cm  01/07/2012  *RADIOLOGY REPORT*  Clinical Data: Progressive left eye swelling.  MRI ORBITS WITHOUT AND WITH CONTRAST  Technique:  Multiplanar and multiecho pulse sequences of the orbits and surrounding structures were obtained including fat saturation techniques, before and after intravenous contrast administration.  Contrast:  5 ml MultiHance was utilized (half-dose in an attempt to limit contrast).  Comparison: 01/02/2012 MR.  01/01/2011 CT.  Findings: Progressive left-sided proptosis with diffuse inflammation of the globe and surrounding soft tissue.  Interval enlargement of the left extraocular muscles most notable left medial rectus (consistent with infectious involvement).  Peripheral extraconal thin fluid collection (maximal thickness 3.6 mm) most notable superiorly and laterally suggestive of an abscess. Retrobulbar fatty reticulation.  Significant elongation of the left optic nerve which demonstrates restricted motion suggesting infection and / or ischemia/infarction.  Preseptal soft tissue swelling extends along the left cheek.  No definitive findings of cavernous sinus thrombosis. Patency of the left superior ophthalmic vein cannot be confirmed.  No brain parenchymal enhancing lesion or abnormal meningeal enhancement.  IMPRESSION: Progressive left-sided proptosis with diffuse inflammation of the globe and surrounding soft tissue.  Peripheral left-sided extraconal thin fluid collection (maximal thickness 3.6 mm) most notable superiorly and laterally suggestive of an abscess.  Significant elongation of the left optic nerve which demonstrates restricted motion suggesting infection and / or ischemia/infarction.  No definitive findings  of cavernous sinus thrombosis. Patency of the left superior ophthalmic vein cannot be confirmed.  No brain parenchymal enhancing lesion or abnormal meningeal enhancement.  Images and cased discussed with Dr. Randon Goldsmith ophthalmology service.  Original Report Authenticated By: Fuller Canada, M.D.    Medications: I have reviewed the patient's current medications. Scheduled Meds:    . artificial tears   Left Eye Q6H WA  . bacitracin-polymyxin b   Left Eye Q6H WA  . ceFAZolin  1 mL Subconjunctival Once  . ceFAZolin  200 mg Subconjunctival Once  . cefTAZidime (FORTAZ)  IV  1 g Intravenous Q8H  . feeding supplement  237 mL Oral BID  . pantoprazole  40 mg Oral BID AC  . potassium chloride  40 mEq Oral Once  . sodium chloride  3 mL Intravenous Q12H  . vancomycin  750 mg Intravenous Q8H  . DISCONTD: artificial tears   Left Eye Q6H WA  . DISCONTD: bacitracin-polymyxin b   Left Eye Q6H WA  . DISCONTD: cefTAZidime  100 mg Subconjunctival Once  . DISCONTD: cefTAZidime  2.25 mg Intravitreal Once  . DISCONTD: vancomycin  0.1 mg Intravitreal Once  . DISCONTD: vancomycin  25 mg Subconjunctival Once   Continuous Infusions:    . DISCONTD: sodium chloride 75 mL/hr at 01/08/12 0600   PRN Meds:.acetaminophen, acetaminophen, alum &  mag hydroxide-simeth, HYDROcodone-acetaminophen, HYDROmorphone (DILAUDID) injection, iohexol, ondansetron (ZOFRAN) IV, ondansetron  Assessment/Plan: Assessment:  84 female with chr low back pain and hx of hep B presents with 2 days hx of painless left eye blindness with hx of nausea. Vomiting an diarrhea 1 week back with findings of left anterior uveitis and multilobar left sided pneumonia on CT scan.  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  abx coverage broadened with IV vanco and cefepime ( 3/3) . Also added fluconazole for fungal coverage on 3/4.  -Dr Randon Goldsmith performed vitrectomy on 3/4 with cx sent and will follow. 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   Hypokalemia repleat  Diarrhea  Prior to admission  c diff negative  resolved    DVT prophylaxis: restart lovenox after TEE SCD   Full code   Consult:  opthalmology :[place] and Dr Everardo Pacific  ID : [place]  Pulmonary :Sherene Sires  TO DO: Will arrange TEE for complete evaluation on Monday (by Corinda Gubler)     LOS: 10 days  Donna Collet, DO 01/09/2012, 9:04 AM

## 2012-01-09 NOTE — Progress Notes (Signed)
Patient ID: Donna Tapia, female   DOB: 1951-07-14, 61 y.o.   MRN: [id number]  S:  Ms Stanhope reports that the left eye remains comfortable.  It has had some fluid discharge over the past 24 hrs.  She denies pain with eye movement.  She has NO visual symptoms OD.     Relevant meds:  IV: Vanc, Fortax  Topical: poly/bac ung alternating with lacrilube Q 3 hrs.   Temp: 98.1  Motility OS:-3 ductions in all directions.   Lighted bedside exam  OD  External/adnexa: Normal  Lids/lashes: Normal  Conjunctiva White, quiet  Cornea: Clear  OS  External/adnexa: 1-2+ periocular erythema, 2+ edema. Mild restriction to retropulsion of orbital contents.  Lids/lashes: 2+ edema and 1-2+ erythema; upper and lower bolstered temporal tarsorrhaphy in place; small amount of exposed/prolapsed conj nasally (improved); Conformer in place with iodoform gauze extending through central opening.  Conjunctiva: small amount of nasal exposure. 2-3+ hyperemia and post-surgical change. Covered by conformer.   Relevant LABS:  WBC:  7.8  Micro:  Blood, lumbosacral epidural abscess, psoas abscess, AC/Vit: No growth.  Intraocular contents gram stain: GPC in pairs  Intraocular contents culture:  No growth.    Imaging:  CT MXF 01/09/12: Orbital cellulitis; no intraorbital abscess.  Nml cavernous sinus, brain.  .   Assessment and Plan:  -- Endogenous endophthalmitis/panophthalmitis OS.  POD #2 s/p evisceration OS.  Orbital signs and symptoms remain improved.  CT MXF with no signs of orbital abscess.    Rec:  Poly/bac alternating with lacrlilube to small amount of exposed conj OS.  Continue broad spectrum IV abx.  Follow cultures closely.  Will need to follow orbital process (clinically and radiographically) for any acute worsening.

## 2012-01-09 NOTE — Progress Notes (Signed)
2 Days Post-Op  Subjective: L psoas abscess drian placed 3/8 Pt better Output good   Objective: Vital signs in last 24 hours: Temp:  [98.1 F (36.7 C)-98.6 F (37 C)] 98.1 F (36.7 C) (03/10 0553) Pulse Rate:  [81-88] 84  (03/10 0553) Resp:  [16-17] 17  (03/10 0553) BP: (113-151)/(64-71) 126/70 mmHg (03/10 0553) SpO2:  [92 %-94 %] 92 % (03/10 0553) Weight:  [149 lb 0.5 oz (67.6 kg)] 149 lb 0.5 oz (67.6 kg) (03/10 0553) Last BM Date: 01/04/12  Intake/Output from previous day: 03/09 0701 - 03/10 0700 In: 2086.8 [P.O.:960; I.V.:526.8; IV Piggyback:600] Out: 2610 [Urine:2575; Drains:35] Intake/Output this shift:    PE:  Afeb; vss Site of drain clean and dry Output 45 cc 3/9              35 cc 3/10; cx no growth so far Serous in color  Lab Results:   Basename 01/09/12 0610 01/08/12 0500  WBC 7.8 9.8  HGB 9.7* 9.3*  HCT 29.7* 28.1*  PLT 654* 662*   BMET  Basename 01/09/12 0610 01/08/12 0500  NA 137 136  K 3.2* 3.2*  CL 102 101  CO2 29 28  GLUCOSE 90 86  BUN 3* 3*  CREATININE 0.53 0.47*  CALCIUM 7.3* 7.1*   PT/INR  Basename 01/06/12 1539  LABPROT 15.4*  INR 1.19   ABG No results found for this basename: PHART:2,PCO2:2,PO2:2,HCO3:2 in the last 72 hours  Studies/Results: Ct Guided Abscess Drain  01/07/2012  *RADIOLOGY REPORT*  Clinical Data: Epidural abscess, status post surgical drainage. Residual left paraspinous iliopsoas abscess  CT FLUOROSCOPY GUIDED LEFT ILIOPSOAS ABSCESS DRAINAGE  Date:  01/07/2012 14:20:00  Radiologist:  M. Ruel Favors, M.D.  Medications:  Versed and Fentanyl for conscious sedation  Guidance:  CT fluoroscopy  Fluoroscopy time:  19 seconds  Sedation time:  25 minutes  Contrast volume:  None.  Complications:  No immediate  PROCEDURE/FINDINGS:  Informed consent was obtained from the patient following explanation of the procedure, risks, benefits and alternatives. The patient understands, agrees and consents for the procedure. All questions  were addressed.  A time out was performed.  Maximal barrier sterile technique utilized including caps, mask, sterile gowns, sterile gloves, large sterile drape, hand hygiene, and betadine  The previous imaging was reviewed.  The left iliopsoas abscess identified.  The patient was positioned left decubitus. Noncontrast localization CT performed.  The left iliopsoas abscess was localized.  Utilizing CT fluoroscopy.  a 17 gauge 11.8 cm access needle was advanced from a right posterior paraspinous approach into the fluid collection.  Syringe aspiration yielded exudative fluid.  Samples sent for Gram stain and culture.  Guide wire advanced.  Tract dilatation performed to insert a 10-French drain.  Drainage catheter position confirmed in the abscess with CT.  Syringe aspiration yielded 20 ml exudative fluid.  Catheter secured with a Prolene suture and connects to external suction bulb.  Sterile dressing applied.  No immediate complication.  The patient tolerated the procedure well.  IMPRESSION: Successful CT fluoroscopy guided 10-French left iliopsoas abscess drain insertion.  Sample sent for Gram stain culture.  Original Report Authenticated By: Judie Petit. Ruel Favors, M.D.   Mr Birdie Hopes Wo/w Cm  01/07/2012  *RADIOLOGY REPORT*  Clinical Data: Progressive left eye swelling.  MRI ORBITS WITHOUT AND WITH CONTRAST  Technique:  Multiplanar and multiecho pulse sequences of the orbits and surrounding structures were obtained including fat saturation techniques, before and after intravenous contrast administration.  Contrast:  5 ml MultiHance  was utilized (half-dose in an attempt to limit contrast).  Comparison: 01/02/2012 MR.  01/01/2011 CT.  Findings: Progressive left-sided proptosis with diffuse inflammation of the globe and surrounding soft tissue.  Interval enlargement of the left extraocular muscles most notable left medial rectus (consistent with infectious involvement).  Peripheral extraconal thin fluid collection (maximal  thickness 3.6 mm) most notable superiorly and laterally suggestive of an abscess. Retrobulbar fatty reticulation.  Significant elongation of the left optic nerve which demonstrates restricted motion suggesting infection and / or ischemia/infarction.  Preseptal soft tissue swelling extends along the left cheek.  No definitive findings of cavernous sinus thrombosis. Patency of the left superior ophthalmic vein cannot be confirmed.  No brain parenchymal enhancing lesion or abnormal meningeal enhancement.  IMPRESSION: Progressive left-sided proptosis with diffuse inflammation of the globe and surrounding soft tissue.  Peripheral left-sided extraconal thin fluid collection (maximal thickness 3.6 mm) most notable superiorly and laterally suggestive of an abscess.  Significant elongation of the left optic nerve which demonstrates restricted motion suggesting infection and / or ischemia/infarction.  No definitive findings of cavernous sinus thrombosis. Patency of the left superior ophthalmic vein cannot be confirmed.  No brain parenchymal enhancing lesion or abnormal meningeal enhancement.  Images and cased discussed with Dr. Randon Goldsmith ophthalmology service.  Original Report Authenticated By: Fuller Canada, M.D.    Anti-infectives: Anti-infectives     Start     Dose/Rate Route Frequency Ordered Stop   01/07/12 2137   ceFAZolin (ANCEF) injection  Status:  Discontinued          As needed 01/07/12 2138 01/07/12 2217   01/07/12 1930   ceFAZolin (ANCEF) 200 mg/mL injection 200 mg        1 mL Subconjunctival  Once 01/07/12 1924     01/07/12 1930   ceFAZolin (ANCEF) 200 mg/mL injection 200 mg        200 mg Subconjunctival  Once 01/07/12 1926     01/06/12 0200   vancomycin (VANCOCIN) 750 mg in sodium chloride 0.9 % 150 mL IVPB        750 mg 150 mL/hr over 60 Minutes Intravenous Every 8 hours 01/05/12 1952     01/05/12 1300   cefTAZidime (FORTAZ) ophth injection 100 mg        100 mg Subconjunctival  Once 01/05/12  1247 01/05/12 1443   01/05/12 1300   vancomycin (VANCOCIN) ophth injection 25 mg        25 mg Subconjunctival  Once 01/05/12 1247 01/05/12 1446   01/05/12 1300   vancomycin (VANCOCIN) injection INJ 1 mg        1 mg Subconjunctival  Once 01/05/12 1247 01/05/12 1446   01/05/12 1200   cefTAZidime (FORTAZ) ophth injection 2.25 mg        2.25 mg Intravitreal  Once 01/05/12 1053 01/05/12 1444   01/04/12 2134   50,000 units bacitracin in 0.9% normal saline 250 mL irrigation  Status:  Discontinued          As needed 01/04/12 2145 01/04/12 2247   01/04/12 2100   bacitracin 34742 UNITS injection     Comments: AURAND, BRANDI: cabinet override         01/04/12 2100 01/05/12 0859   01/04/12 2100   ceFAZolin (ANCEF) 1-5 GM-% IVPB     Comments: AURAND, BRANDI: cabinet override         01/04/12 2100 01/05/12 0859   01/04/12 1830   fluconazole (DIFLUCAN) IVPB 400 mg  Status:  Discontinued  400 mg 200 mL/hr over 60 Minutes Intravenous Every 24 hours 01/03/12 1827 01/07/12 1616   01/03/12 1930   fluconazole (DIFLUCAN) IVPB 800 mg        800 mg 400 mL/hr over 60 Minutes Intravenous  Once 01/03/12 1826 01/03/12 2139   01/03/12 1245   cefTAZidime (FORTAZ) ophth injection 2.25 mg  Status:  Discontinued        2.25 mg Intravitreal  Once 01/03/12 1135 01/09/12 0427   01/03/12 1245   cefTAZidime (FORTAZ) ophth injection 100 mg  Status:  Discontinued        100 mg Subconjunctival  Once 01/03/12 1135 01/09/12 0427   01/03/12 1200   vancomycin (VANCOCIN) ophth injection 0.1 mg  Status:  Discontinued        0.1 mg Intravitreal  Once 01/03/12 1130 01/09/12 0427   01/03/12 1200   vancomycin (VANCOCIN) ophth injection 25 mg  Status:  Discontinued        25 mg Subconjunctival  Once 01/03/12 1131 01/09/12 0427   01/03/12 0500   vancomycin (VANCOCIN) 750 mg in sodium chloride 0.9 % 150 mL IVPB  Status:  Discontinued        750 mg 150 mL/hr over 60 Minutes Intravenous Every 12 hours 01/02/12 1716  01/05/12 1953   01/03/12 0200   ceFEPIme (MAXIPIME) 2 g in dextrose 5 % 50 mL IVPB  Status:  Discontinued        2 g 100 mL/hr over 30 Minutes Intravenous Every 8 hours 01/02/12 1716 01/02/12 2041   01/02/12 2200   cefTAZidime (FORTAZ) 1 g in dextrose 5 % 50 mL IVPB        1 g 100 mL/hr over 30 Minutes Intravenous 3 times per day 01/02/12 2106     01/02/12 1400   ceFEPIme (MAXIPIME) 2 g in dextrose 5 % 50 mL IVPB  Status:  Discontinued        2 g 100 mL/hr over 30 Minutes Intravenous 3 times per day 01/02/12 1318 01/02/12 1716   01/02/12 1400   vancomycin (VANCOCIN) 750 mg in sodium chloride 0.9 % 150 mL IVPB  Status:  Discontinued        750 mg 150 mL/hr over 60 Minutes Intravenous Every 12 hours 01/02/12 1350 01/02/12 1716   12/31/11 1530   valACYclovir (VALTREX) tablet 2,000 mg        2,000 mg Oral 2 times daily 12/31/11 1358 12/31/11 2235   12/31/11 0100   moxifloxacin (AVELOX) IVPB 400 mg  Status:  Discontinued        400 mg 250 mL/hr over 60 Minutes Intravenous Every 24 hours 12/31/11 0040 01/03/12 1913          Assessment/Plan: s/p Procedure(s) (LRB): EVISCERATION REPAIR (Left)  Lt psoas abscess drain intact Doing well cx no growth Will follow    Marjo Grosvenor A 01/09/2012

## 2012-01-09 NOTE — Progress Notes (Signed)
INFECTIOUS DISEASE PROGRESS NOTE  ID: JAVARIA KNAPKE is a 61 y.o. female with  Active Problems:  Dehydration  Renal failure, acute  Uveitis, anterior  Nausea & vomiting  Lobar pneumonia due to unspecified organism  Subjective: Without complaints  Abtx:  Anti-infectives     Start     Dose/Rate Route Frequency Ordered Stop   01/07/12 2137   ceFAZolin (ANCEF) injection  Status:  Discontinued          As needed 01/07/12 2138 01/07/12 2217   01/07/12 1930   ceFAZolin (ANCEF) 200 mg/mL injection 200 mg        1 mL Subconjunctival  Once 01/07/12 1924     01/07/12 1930   ceFAZolin (ANCEF) 200 mg/mL injection 200 mg        200 mg Subconjunctival  Once 01/07/12 1926     01/06/12 0200   vancomycin (VANCOCIN) 750 mg in sodium chloride 0.9 % 150 mL IVPB        750 mg 150 mL/hr over 60 Minutes Intravenous Every 8 hours 01/05/12 1952     01/05/12 1300   cefTAZidime (FORTAZ) ophth injection 100 mg        100 mg Subconjunctival  Once 01/05/12 1247 01/05/12 1443   01/05/12 1300   vancomycin (VANCOCIN) ophth injection 25 mg        25 mg Subconjunctival  Once 01/05/12 1247 01/05/12 1446   01/05/12 1300   vancomycin (VANCOCIN) injection INJ 1 mg        1 mg Subconjunctival  Once 01/05/12 1247 01/05/12 1446   01/05/12 1200   cefTAZidime (FORTAZ) ophth injection 2.25 mg        2.25 mg Intravitreal  Once 01/05/12 1053 01/05/12 1444   01/04/12 2134   50,000 units bacitracin in 0.9% normal saline 250 mL irrigation  Status:  Discontinued          As needed 01/04/12 2145 01/04/12 2247   01/04/12 2100   bacitracin 54098 UNITS injection     Comments: AURAND, BRANDI: cabinet override         01/04/12 2100 01/05/12 0859   01/04/12 2100   ceFAZolin (ANCEF) 1-5 GM-% IVPB     Comments: AURAND, BRANDI: cabinet override         01/04/12 2100 01/05/12 0859   01/04/12 1830   fluconazole (DIFLUCAN) IVPB 400 mg  Status:  Discontinued        400 mg 200 mL/hr over 60 Minutes Intravenous Every 24 hours  01/03/12 1827 01/07/12 1616   01/03/12 1930   fluconazole (DIFLUCAN) IVPB 800 mg        800 mg 400 mL/hr over 60 Minutes Intravenous  Once 01/03/12 1826 01/03/12 2139   01/03/12 1245   cefTAZidime (FORTAZ) ophth injection 2.25 mg  Status:  Discontinued        2.25 mg Intravitreal  Once 01/03/12 1135 01/09/12 0427   01/03/12 1245   cefTAZidime (FORTAZ) ophth injection 100 mg  Status:  Discontinued        100 mg Subconjunctival  Once 01/03/12 1135 01/09/12 0427   01/03/12 1200   vancomycin (VANCOCIN) ophth injection 0.1 mg  Status:  Discontinued        0.1 mg Intravitreal  Once 01/03/12 1130 01/09/12 0427   01/03/12 1200   vancomycin (VANCOCIN) ophth injection 25 mg  Status:  Discontinued        25 mg Subconjunctival  Once 01/03/12 1131 01/09/12 0427   01/03/12 0500  vancomycin (VANCOCIN) 750 mg in sodium chloride 0.9 % 150 mL IVPB  Status:  Discontinued        750 mg 150 mL/hr over 60 Minutes Intravenous Every 12 hours 01/02/12 1716 01/05/12 1953   01/03/12 0200   ceFEPIme (MAXIPIME) 2 g in dextrose 5 % 50 mL IVPB  Status:  Discontinued        2 g 100 mL/hr over 30 Minutes Intravenous Every 8 hours 01/02/12 1716 01/02/12 2041   01/02/12 2200   cefTAZidime (FORTAZ) 1 g in dextrose 5 % 50 mL IVPB        1 g 100 mL/hr over 30 Minutes Intravenous 3 times per day 01/02/12 2106     01/02/12 1400   ceFEPIme (MAXIPIME) 2 g in dextrose 5 % 50 mL IVPB  Status:  Discontinued        2 g 100 mL/hr over 30 Minutes Intravenous 3 times per day 01/02/12 1318 01/02/12 1716   01/02/12 1400   vancomycin (VANCOCIN) 750 mg in sodium chloride 0.9 % 150 mL IVPB  Status:  Discontinued        750 mg 150 mL/hr over 60 Minutes Intravenous Every 12 hours 01/02/12 1350 01/02/12 1716   12/31/11 1530   valACYclovir (VALTREX) tablet 2,000 mg        2,000 mg Oral 2 times daily 12/31/11 1358 12/31/11 2235   12/31/11 0100   moxifloxacin (AVELOX) IVPB 400 mg  Status:  Discontinued        400 mg 250 mL/hr over  60 Minutes Intravenous Every 24 hours 12/31/11 0040 01/03/12 1913          Medications:  Scheduled:   . artificial tears   Left Eye Q6H WA  . bacitracin-polymyxin b   Left Eye Q6H WA  . ceFAZolin  1 mL Subconjunctival Once  . ceFAZolin  200 mg Subconjunctival Once  . cefTAZidime (FORTAZ)  IV  1 g Intravenous Q8H  . enoxaparin (LOVENOX) injection  40 mg Subcutaneous Q24H  . feeding supplement  237 mL Oral BID  . pantoprazole  40 mg Oral BID AC  . potassium chloride  40 mEq Oral Once  . potassium chloride  40 mEq Oral Once  . sodium chloride  3 mL Intravenous Q12H  . vancomycin  750 mg Intravenous Q8H  . DISCONTD: bacitracin-polymyxin b   Left Eye Q6H WA  . DISCONTD: cefTAZidime  100 mg Subconjunctival Once  . DISCONTD: cefTAZidime  2.25 mg Intravitreal Once  . DISCONTD: vancomycin  0.1 mg Intravitreal Once  . DISCONTD: vancomycin  25 mg Subconjunctival Once    Objective: Vital signs in last 24 hours: Temp:  [98.1 F (36.7 C)-98.6 F (37 C)] 98.1 F (36.7 C) (03/10 0553) Pulse Rate:  [81-88] 84  (03/10 0553) Resp:  [16-17] 17  (03/10 0553) BP: (113-151)/(64-71) 126/70 mmHg (03/10 0553) SpO2:  [92 %-94 %] 92 % (03/10 0553) Weight:  [67.6 kg (149 lb 0.5 oz)] 67.6 kg (149 lb 0.5 oz) (03/10 0553)   General appearance: alert, cooperative and no distress  Lab Results  Basename 01/09/12 0610 01/08/12 0500  WBC 7.8 9.8  HGB 9.7* 9.3*  HCT 29.7* 28.1*  NA 137 136  K 3.2* 3.2*  CL 102 101  CO2 29 28  BUN 3* 3*  CREATININE 0.53 0.47*  GLU -- --   Liver Panel No results found for this basename: PROT:2,ALBUMIN:2,AST:2,ALT:2,ALKPHOS:2,BILITOT:2,BILIDIR:2,IBILI:2 in the last 72 hours Sedimentation Rate No results found for this basename: ESRSEDRATE in  the last 72 hours C-Reactive Protein No results found for this basename: CRP:2 in the last 72 hours  Microbiology: Recent Results (from the past 240 hour(s))  URINE CULTURE     Status: Normal   Collection Time   12/31/11   4:15 AM      Component Value Range Status Comment   Specimen Description URINE, CLEAN CATCH   Final    Special Requests NONE   Final    Culture  Setup Time [id number]   Final    Colony Count NO GROWTH   Final    Culture NO GROWTH   Final    Report Status 01/01/2012 FINAL   Final   CULTURE, BLOOD (ROUTINE X 2)     Status: Normal   Collection Time   12/31/11  8:42 AM      Component Value Range Status Comment   Specimen Description BLOOD LEFT ARM   Final    Special Requests BOTTLES DRAWN AEROBIC ONLY 10CC   Final    Culture  Setup Time [id number]   Final    Culture NO GROWTH 5 DAYS   Final    Report Status 01/06/2012 FINAL   Final   CULTURE, BLOOD (ROUTINE X 2)     Status: Normal   Collection Time   12/31/11  8:48 AM      Component Value Range Status Comment   Specimen Description BLOOD LEFT HAND   Final    Special Requests BOTTLES DRAWN AEROBIC ONLY 10CC   Final    Culture  Setup Time [id number]   Final    Culture NO GROWTH 5 DAYS   Final    Report Status 01/06/2012 FINAL   Final   CULTURE, SPUTUM-ASSESSMENT     Status: Normal   Collection Time   12/31/11  2:15 PM      Component Value Range Status Comment   Specimen Description SPUTUM   Final    Special Requests NONE   Final    Sputum evaluation     Final    Value: THIS SPECIMEN IS ACCEPTABLE. RESPIRATORY CULTURE REPORT TO FOLLOW.   Report Status 12/31/2011 FINAL   Final   CULTURE, RESPIRATORY     Status: Normal   Collection Time   12/31/11  2:15 PM      Component Value Range Status Comment   Specimen Description SPUTUM   Final    Special Requests NONE   Final    Gram Stain     Final    Value: NO WBC SEEN     NO SQUAMOUS EPITHELIAL CELLS SEEN     FEW YEAST   Culture NORMAL OROPHARYNGEAL FLORA   Final    Report Status 01/03/2012 FINAL   Final   STOOL CULTURE     Status: Normal   Collection Time   12/31/11  2:45 PM      Component Value Range Status Comment   Specimen Description STOOL   Final    Special Requests NONE    Final    Culture     Final    Value: NO SALMONELLA, SHIGELLA, CAMPYLOBACTER, OR YERSINIA ISOLATED   Report Status 01/04/2012 FINAL   Final   CLOSTRIDIUM DIFFICILE BY PCR     Status: Normal   Collection Time   01/02/12 11:29 AM      Component Value Range Status Comment   C difficile by pcr NEGATIVE  NEGATIVE  Final   EYE CULTURE     Status: Normal (Preliminary result)  Collection Time   01/03/12  1:45 PM      Component Value Range Status Comment   Specimen Description EYE LEFT   Final    Special Requests     Final    Value: VTREOUS PT ON VANC CEFTAZ DO SENS TO ALL GROWTH INCLUDING COAG NEG STAPH PER MD   Culture NO GROWTH 5 DAYS   Final    Report Status PENDING   Incomplete   EYE CULTURE     Status: Normal (Preliminary result)   Collection Time   01/03/12  1:45 PM      Component Value Range Status Comment   Specimen Description EYE LEFT   Final    Special Requests     Final    Value: ANTEROR CHAMBER PT ON VANC CEFTAZ DO SENS TO ALL GROWTH INCLUDING COAG NEG STAPH PER MD   Culture NO GROWTH 5 DAYS   Final    Report Status PENDING   Incomplete   CULTURE, BLOOD (ROUTINE X 2)     Status: Normal (Preliminary result)   Collection Time   01/03/12  8:32 PM      Component Value Range Status Comment   Specimen Description BLOOD RIGHT HAND   Final    Special Requests BOTTLES DRAWN AEROBIC ONLY 10CC   Final    Culture  Setup Time [id number]   Final    Culture     Final    Value:        BLOOD CULTURE RECEIVED NO GROWTH TO DATE CULTURE WILL BE HELD FOR 5 DAYS BEFORE ISSUING A FINAL NEGATIVE REPORT   Report Status PENDING   Incomplete   CULTURE, BLOOD (ROUTINE X 2)     Status: Normal (Preliminary result)   Collection Time   01/03/12  8:39 PM      Component Value Range Status Comment   Specimen Description BLOOD RIGHT FOREARM   Final    Special Requests BOTTLES DRAWN AEROBIC ONLY 10CC   Final    Culture  Setup Time [id number]   Final    Culture     Final    Value:        BLOOD CULTURE RECEIVED  NO GROWTH TO DATE CULTURE WILL BE HELD FOR 5 DAYS BEFORE ISSUING A FINAL NEGATIVE REPORT   Report Status PENDING   Incomplete   CULTURE, SPUTUM-ASSESSMENT     Status: Normal   Collection Time   01/04/12  6:48 AM      Component Value Range Status Comment   Specimen Description SPUTUM   Final    Special Requests NONE   Final    Sputum evaluation     Final    Value: MICROSCOPIC FINDINGS SUGGEST THAT THIS SPECIMEN IS NOT REPRESENTATIVE OF LOWER RESPIRATORY SECRETIONS. PLEASE RECOLLECT.     CALLED TO [facility] RN 01/04/12 0800 COSTELLO B   Report Status 01/04/2012 FINAL   Final   MRSA PCR SCREENING     Status: Normal   Collection Time   01/04/12  4:20 PM      Component Value Range Status Comment   MRSA by PCR NEGATIVE  NEGATIVE  Final   CULTURE, ROUTINE-ABSCESS     Status: Normal   Collection Time   01/04/12  9:46 PM      Component Value Range Status Comment   Specimen Description ABSCESS BACK   Final    Special Requests PATIENT ON FOLLOWING LORTAZ, DIFLUCAN EPIDURAL   Final    Gram Stain  Final    Value: ABUNDANT WBC PRESENT,BOTH PMN AND MONONUCLEAR     NO ORGANISMS SEEN   Culture NO GROWTH 3 DAYS   Final    Report Status 01/08/2012 FINAL   Final   ANAEROBIC CULTURE     Status: Normal   Collection Time   01/04/12  9:46 PM      Component Value Range Status Comment   Specimen Description ABSCESS BACK   Final    Special Requests PATIENT ON FOLLOWING LORTAZ, DIFLUCAN EPIDURAL   Final    Gram Stain     Final    Value: ABUNDANT WBC PRESENT,BOTH PMN AND MONONUCLEAR     NO ORGANISMS SEEN     Performed at [facility]   Culture NO ANAEROBES ISOLATED   Final    Report Status 01/09/2012 FINAL   Final   GRAM STAIN     Status: Normal   Collection Time   01/04/12  9:46 PM      Component Value Range Status Comment   Specimen Description ABSCESS BACK   Final    Special Requests PATIENT ON FOLLOWING LORTAZ, DIFLUCAN EPIDURAL   Final    Gram Stain     Final    Value: ABUNDANT WBC PRESENT,BOTH PMN  AND MONONUCLEAR     NO ORGANISMS SEEN   Report Status 01/04/2012 FINAL   Final   CULTURE, ROUTINE-ABSCESS     Status: Normal (Preliminary result)   Collection Time   01/07/12  4:14 PM      Component Value Range Status Comment   Specimen Description ABSCESS   Final    Special Requests PSOAS MUSCLE   Final    Gram Stain     Final    Value: MODERATE WBC PRESENT, PREDOMINANTLY PMN     NO SQUAMOUS EPITHELIAL CELLS SEEN     NO ORGANISMS SEEN   Culture NO GROWTH 2 DAYS   Final    Report Status PENDING   Incomplete   CULTURE, ROUTINE-ABSCESS     Status: Normal (Preliminary result)   Collection Time   01/07/12  9:03 PM      Component Value Range Status Comment   Specimen Description ABSCESS EYE LEFT   Final    Special Requests INTRAOCULAR   Final    Gram Stain     Final    Value: ABUNDANT WBC PRESENT,BOTH PMN AND MONONUCLEAR     RARE GRAM POSITIVE COCCI IN PAIRS     Performed at [facility]   Culture NO GROWTH 1 DAY   Final    Report Status PENDING   Incomplete   ANAEROBIC CULTURE     Status: Normal (Preliminary result)   Collection Time   01/07/12  9:03 PM      Component Value Range Status Comment   Specimen Description ABSCESS EYE LEFT   Final    Special Requests INTRAOCULAR   Final    Gram Stain     Final    Value: ABUNDANT WBC PRESENT,BOTH PMN AND MONONUCLEAR     RARE GRAM POSITIVE COCCI IN PAIRS     Performed at [facility]   Culture     Final    Value: NO ANAEROBES ISOLATED; CULTURE IN PROGRESS FOR 5 DAYS   Report Status PENDING   Incomplete   GRAM STAIN     Status: Normal   Collection Time   01/07/12  9:03 PM      Component Value Range Status Comment  Specimen Description ABSCESS EYE LEFT   Final    Special Requests INTRAOCULAR   Final    Gram Stain     Final    Value: ABUNDANT WBC PRESENT,BOTH PMN AND MONONUCLEAR     RARE GRAM POSITIVE COCCI IN PAIRS   Report Status 01/07/2012 FINAL   Final     Studies/Results: Ct Guided Abscess Drain  01/07/2012   *RADIOLOGY REPORT*  Clinical Data: Epidural abscess, status post surgical drainage. Residual left paraspinous iliopsoas abscess  CT FLUOROSCOPY GUIDED LEFT ILIOPSOAS ABSCESS DRAINAGE  Date:  01/07/2012 14:20:00  Radiologist:  M. Ruel Favors, M.D.  Medications:  Versed and Fentanyl for conscious sedation  Guidance:  CT fluoroscopy  Fluoroscopy time:  19 seconds  Sedation time:  25 minutes  Contrast volume:  None.  Complications:  No immediate  PROCEDURE/FINDINGS:  Informed consent was obtained from the patient following explanation of the procedure, risks, benefits and alternatives. The patient understands, agrees and consents for the procedure. All questions were addressed.  A time out was performed.  Maximal barrier sterile technique utilized including caps, mask, sterile gowns, sterile gloves, large sterile drape, hand hygiene, and betadine  The previous imaging was reviewed.  The left iliopsoas abscess identified.  The patient was positioned left decubitus. Noncontrast localization CT performed.  The left iliopsoas abscess was localized.  Utilizing CT fluoroscopy.  a 17 gauge 11.8 cm access needle was advanced from a right posterior paraspinous approach into the fluid collection.  Syringe aspiration yielded exudative fluid.  Samples sent for Gram stain and culture.  Guide wire advanced.  Tract dilatation performed to insert a 10-French drain.  Drainage catheter position confirmed in the abscess with CT.  Syringe aspiration yielded 20 ml exudative fluid.  Catheter secured with a Prolene suture and connects to external suction bulb.  Sterile dressing applied.  No immediate complication.  The patient tolerated the procedure well.  IMPRESSION: Successful CT fluoroscopy guided 10-French left iliopsoas abscess drain insertion.  Sample sent for Gram stain culture.  Original Report Authenticated By: Judie Petit. Ruel Favors, M.D.   Ct Maxillofacial W/cm  01/09/2012  *RADIOLOGY REPORT*  Clinical Data: Left periorbital cellulitis  and swelling.  CT MAXILLOFACIAL WITH CONTRAST  Technique:  Multidetector CT imaging of the maxillofacial structures was performed with intravenous contrast. Multiplanar CT image reconstructions were also generated.  Contrast: 75mL OMNIPAQUE IOHEXOL 300 MG/ML IJ SOLN  Comparison: 01/07/2012 and multiple previous  Findings: There has been interval surgery.  Hyperdense structure covers the outer surface of the globe.  The globe appears to be filled with heterogeneous low density material probably representing  packing.  Outer structures of the globe appear swollen and inflammed.  Post septal orbital fat inflammation remains evident.  Some inflammation is seen along the left optic nerve as well.  I cannot identify  any intra orbital undrained fluid collection. No evidence of intracranial extension of inflammatory disease.  Cavernous sinus regions appear symmetric.  IMPRESSION: Apparent surgical debridement of the left globe.  The globe appears to be filled with packing material.  Persistent inflammation of the outer layers of the globe.  Inflammatory type edema within the post- septal orbital fat but no evidence of undrained orbital fluid collection.  Original Report Authenticated By: Thomasenia Sales, M.D.   Mr Orbits Wo/w Cm  01/07/2012  *RADIOLOGY REPORT*  Clinical Data: Progressive left eye swelling.  MRI ORBITS WITHOUT AND WITH CONTRAST  Technique:  Multiplanar and multiecho pulse sequences of the orbits and surrounding structures were obtained  including fat saturation techniques, before and after intravenous contrast administration.  Contrast:  5 ml MultiHance was utilized (half-dose in an attempt to limit contrast).  Comparison: 01/02/2012 MR.  01/01/2011 CT.  Findings: Progressive left-sided proptosis with diffuse inflammation of the globe and surrounding soft tissue.  Interval enlargement of the left extraocular muscles most notable left medial rectus (consistent with infectious involvement).  Peripheral  extraconal thin fluid collection (maximal thickness 3.6 mm) most notable superiorly and laterally suggestive of an abscess. Retrobulbar fatty reticulation.  Significant elongation of the left optic nerve which demonstrates restricted motion suggesting infection and / or ischemia/infarction.  Preseptal soft tissue swelling extends along the left cheek.  No definitive findings of cavernous sinus thrombosis. Patency of the left superior ophthalmic vein cannot be confirmed.  No brain parenchymal enhancing lesion or abnormal meningeal enhancement.  IMPRESSION: Progressive left-sided proptosis with diffuse inflammation of the globe and surrounding soft tissue.  Peripheral left-sided extraconal thin fluid collection (maximal thickness 3.6 mm) most notable superiorly and laterally suggestive of an abscess.  Significant elongation of the left optic nerve which demonstrates restricted motion suggesting infection and / or ischemia/infarction.  No definitive findings of cavernous sinus thrombosis. Patency of the left superior ophthalmic vein cannot be confirmed.  No brain parenchymal enhancing lesion or abnormal meningeal enhancement.  Images and cased discussed with Dr. Randon Goldsmith ophthalmology service.  Original Report Authenticated By: Fuller Canada, M.D.     Assessment/Plan: Day 11 anbx  Pan-ophthalmitis  Epidural, Psoas abscess  Pneumonia  Multiple Cx's negative (abscess). Her eye gram stain shows some strep?Marland Kitchen Will continue her current anbx (vanco/ceftaz) for now. Await final cx. CT shows some continued edema, not clear if post-surgical or remnant of previous infection. There is no abscess.    Johny Sax Infectious Diseases [phone] 01/09/2012, 12:35 PM

## 2012-01-10 ENCOUNTER — Encounter (HOSPITAL_COMMUNITY)
Admission: EM | Disposition: A | Discharge status: Home Health | Payer: Self-pay | Source: Home / Self Care | Attending: Internal Medicine

## 2012-01-10 ENCOUNTER — Encounter (HOSPITAL_COMMUNITY): Payer: Self-pay | Admitting: *Deleted

## 2012-01-10 DIAGNOSIS — Q211 Atrial septal defect: Secondary | ICD-10-CM

## 2012-01-10 DIAGNOSIS — Q2111 Secundum atrial septal defect: Secondary | ICD-10-CM

## 2012-01-10 HISTORY — PX: TEE WITHOUT CARDIOVERSION: SHX5443

## 2012-01-10 LAB — CULTURE, BLOOD (ROUTINE X 2)
Culture  Setup Time: 201303050358
Culture: NO GROWTH
Culture: NO GROWTH

## 2012-01-10 LAB — BASIC METABOLIC PANEL
BUN: <3 mg/dL — ABNORMAL LOW (ref 6–23)
CO2: 31 mEq/L (ref 19–32)
Chloride: 101 mEq/L (ref 96–112)
Glucose, Bld: 91 mg/dL (ref 70–99)
Potassium: 3.2 mEq/L — ABNORMAL LOW (ref 3.5–5.1)
Sodium: 137 mEq/L (ref 135–145)

## 2012-01-10 LAB — CBC
HCT: 29.6 % — ABNORMAL LOW (ref 36.0–46.0)
Hemoglobin: 9.6 g/dL — ABNORMAL LOW (ref 12.0–15.0)
RBC: 3.17 MIL/uL — ABNORMAL LOW (ref 3.87–5.11)

## 2012-01-10 SURGERY — ECHOCARDIOGRAM, TRANSESOPHAGEAL
Anesthesia: Moderate Sedation

## 2012-01-10 MED ORDER — FENTANYL CITRATE 0.05 MG/ML IJ SOLN
INTRAMUSCULAR | Status: DC | PRN
  Administered 2012-01-10: 25 ug via INTRAVENOUS

## 2012-01-10 MED ORDER — SODIUM CHLORIDE 0.9 % IV SOLN: 250.0000 mL | INTRAVENOUS | Status: DC | PRN

## 2012-01-10 MED ORDER — FENTANYL CITRATE 0.05 MG/ML IJ SOLN: 250.0000 ug | Freq: Once | INTRAMUSCULAR | Status: DC

## 2012-01-10 MED ORDER — MIDAZOLAM HCL 10 MG/2ML IJ SOLN
INTRAMUSCULAR | Status: AC
  Filled 2012-01-10: qty 2

## 2012-01-10 MED ORDER — HYDROCORTISONE 0.5 % EX CREA
TOPICAL_CREAM | Freq: Three times a day (TID) | CUTANEOUS | Status: DC | PRN
  Administered 2012-01-11 – 2012-01-17 (×10): via TOPICAL
  Filled 2012-01-10 (×2): qty 28.35

## 2012-01-10 MED ORDER — SODIUM CHLORIDE 0.9 % IJ SOLN: 3.0000 mL | INTRAMUSCULAR | Status: DC | PRN

## 2012-01-10 MED ORDER — BENZOCAINE 20 % MT SOLN: 1.0000 "application " | OROMUCOSAL | Status: DC | PRN

## 2012-01-10 MED ORDER — BUTAMBEN-TETRACAINE-BENZOCAINE 2-2-14 % EX AERO
INHALATION_SPRAY | CUTANEOUS | Status: DC | PRN
  Administered 2012-01-10: 2 via TOPICAL

## 2012-01-10 MED ORDER — SODIUM CHLORIDE 0.9 % IJ SOLN: 3.0000 mL | Freq: Two times a day (BID) | INTRAMUSCULAR | Status: DC

## 2012-01-10 MED ORDER — SODIUM CHLORIDE 0.45 % IV SOLN: INTRAVENOUS | Status: DC

## 2012-01-10 MED ORDER — MIDAZOLAM HCL 10 MG/2ML IJ SOLN
INTRAMUSCULAR | Status: DC | PRN
  Administered 2012-01-10: 2 mg via INTRAVENOUS

## 2012-01-10 MED ORDER — GADOBENATE DIMEGLUMINE 529 MG/ML IV SOLN
5.0000 mL | Freq: Once | INTRAVENOUS | Status: AC
  Administered 2012-01-10: 5 mL via INTRAVENOUS

## 2012-01-10 MED ORDER — MIDAZOLAM HCL 10 MG/2ML IJ SOLN: 10.0000 mg | Freq: Once | INTRAMUSCULAR | Status: DC

## 2012-01-10 MED ORDER — POTASSIUM CHLORIDE CRYS ER 20 MEQ PO TBCR
40.0000 meq | EXTENDED_RELEASE_TABLET | Freq: Once | ORAL | Status: AC
  Administered 2012-01-10: 40 meq via ORAL
  Filled 2012-01-10: qty 1

## 2012-01-10 MED ORDER — FENTANYL CITRATE 0.05 MG/ML IJ SOLN
INTRAMUSCULAR | Status: AC
  Filled 2012-01-10: qty 2

## 2012-01-10 NOTE — Progress Notes (Addendum)
INFECTIOUS DISEASE PROGRESS NOTE  ID: Donna Tapia is a 62 y.o. female with pan endophthalmitis.  Active Problems:  Dehydration  Renal failure, acute  Uveitis, anterior  Nausea & vomiting  Lobar pneumonia due to unspecified organism  Subjective: Without complaints  Abtx:  Anti-infectives     Start     Dose/Rate Route Frequency Ordered Stop   01/07/12 2137   ceFAZolin (ANCEF) injection  Status:  Discontinued          As needed 01/07/12 2138 01/07/12 2217   01/07/12 1930   ceFAZolin (ANCEF) 200 mg/mL injection 200 mg        1 mL Subconjunctival  Once 01/07/12 1924     01/07/12 1930   ceFAZolin (ANCEF) 200 mg/mL injection 200 mg        200 mg Subconjunctival  Once 01/07/12 1926     01/06/12 0200   vancomycin (VANCOCIN) 750 mg in sodium chloride 0.9 % 150 mL IVPB        750 mg 150 mL/hr over 60 Minutes Intravenous Every 8 hours 01/05/12 1952     01/05/12 1300   cefTAZidime (FORTAZ) ophth injection 100 mg        100 mg Subconjunctival  Once 01/05/12 1247 01/05/12 1443   01/05/12 1300   vancomycin (VANCOCIN) ophth injection 25 mg        25 mg Subconjunctival  Once 01/05/12 1247 01/05/12 1446   01/05/12 1300   vancomycin (VANCOCIN) injection INJ 1 mg        1 mg Subconjunctival  Once 01/05/12 1247 01/05/12 1446   01/05/12 1200   cefTAZidime (FORTAZ) ophth injection 2.25 mg        2.25 mg Intravitreal  Once 01/05/12 1053 01/05/12 1444   01/04/12 2134   50,000 units bacitracin in 0.9% normal saline 250 mL irrigation  Status:  Discontinued          As needed 01/04/12 2145 01/04/12 2247   01/04/12 2100   bacitracin 40981 UNITS injection     Comments: AURAND, BRANDI: cabinet override         01/04/12 2100 01/05/12 0859   01/04/12 2100   ceFAZolin (ANCEF) 1-5 GM-% IVPB     Comments: AURAND, BRANDI: cabinet override         01/04/12 2100 01/05/12 0859   01/04/12 1830   fluconazole (DIFLUCAN) IVPB 400 mg  Status:  Discontinued        400 mg 200 mL/hr over 60 Minutes  Intravenous Every 24 hours 01/03/12 1827 01/07/12 1616   01/03/12 1930   fluconazole (DIFLUCAN) IVPB 800 mg        800 mg 400 mL/hr over 60 Minutes Intravenous  Once 01/03/12 1826 01/03/12 2139   01/03/12 1245   cefTAZidime (FORTAZ) ophth injection 2.25 mg  Status:  Discontinued        2.25 mg Intravitreal  Once 01/03/12 1135 01/09/12 0427   01/03/12 1245   cefTAZidime (FORTAZ) ophth injection 100 mg  Status:  Discontinued        100 mg Subconjunctival  Once 01/03/12 1135 01/09/12 0427   01/03/12 1200   vancomycin (VANCOCIN) ophth injection 0.1 mg  Status:  Discontinued        0.1 mg Intravitreal  Once 01/03/12 1130 01/09/12 0427   01/03/12 1200   vancomycin (VANCOCIN) ophth injection 25 mg  Status:  Discontinued        25 mg Subconjunctival  Once 01/03/12 1131 01/09/12 0427   01/03/12 0500  vancomycin (VANCOCIN) 750 mg in sodium chloride 0.9 % 150 mL IVPB  Status:  Discontinued        750 mg 150 mL/hr over 60 Minutes Intravenous Every 12 hours 01/02/12 1716 01/05/12 1953   01/03/12 0200   ceFEPIme (MAXIPIME) 2 g in dextrose 5 % 50 mL IVPB  Status:  Discontinued        2 g 100 mL/hr over 30 Minutes Intravenous Every 8 hours 01/02/12 1716 01/02/12 2041   01/02/12 2200   cefTAZidime (FORTAZ) 1 g in dextrose 5 % 50 mL IVPB        1 g 100 mL/hr over 30 Minutes Intravenous 3 times per day 01/02/12 2106     01/02/12 1400   ceFEPIme (MAXIPIME) 2 g in dextrose 5 % 50 mL IVPB  Status:  Discontinued        2 g 100 mL/hr over 30 Minutes Intravenous 3 times per day 01/02/12 1318 01/02/12 1716   01/02/12 1400   vancomycin (VANCOCIN) 750 mg in sodium chloride 0.9 % 150 mL IVPB  Status:  Discontinued        750 mg 150 mL/hr over 60 Minutes Intravenous Every 12 hours 01/02/12 1350 01/02/12 1716   12/31/11 1530   valACYclovir (VALTREX) tablet 2,000 mg        2,000 mg Oral 2 times daily 12/31/11 1358 12/31/11 2235   12/31/11 0100   moxifloxacin (AVELOX) IVPB 400 mg  Status:  Discontinued         400 mg 250 mL/hr over 60 Minutes Intravenous Every 24 hours 12/31/11 0040 01/03/12 1913          Medications:  Scheduled:    . artificial tears   Left Eye Q6H WA  . bacitracin-polymyxin b   Left Eye Q6H WA  . ceFAZolin  1 mL Subconjunctival Once  . ceFAZolin  200 mg Subconjunctival Once  . cefTAZidime (FORTAZ)  IV  1 g Intravenous Q8H  . enoxaparin (LOVENOX) injection  40 mg Subcutaneous Q24H  . feeding supplement  237 mL Oral BID  . gadobenate dimeglumine  5 mL Intravenous Once  . pantoprazole  40 mg Oral BID AC  . potassium chloride  40 mEq Oral Once  . sodium chloride  3 mL Intravenous Q12H  . vancomycin  750 mg Intravenous Q8H  . DISCONTD: fentaNYL  250 mcg Intravenous Once  . DISCONTD: midazolam  10 mg Intravenous Once  . DISCONTD: sodium chloride  3 mL Intravenous Q12H    Objective: Vital signs in last 24 hours: Temp:  [97.8 F (36.6 C)-98.5 F (36.9 C)] 98.2 F (36.8 C) (03/11 1445) Pulse Rate:  [80-98] 98  (03/11 1445) Resp:  [13-28] 18  (03/11 1445) BP: (121-152)/(67-110) 121/69 mmHg (03/11 1445) SpO2:  [91 %-100 %] 96 % (03/11 1445) Weight:  [139 lb 12.4 oz (63.4 kg)] 139 lb 12.4 oz (63.4 kg) (03/11 0500)   General appearance: alert, cooperative and no distress  Lab Results  Basename 01/10/12 0640 01/09/12 0610  WBC 7.3 7.8  HGB 9.6* 9.7*  HCT 29.6* 29.7*  NA 137 137  K 3.2* 3.2*  CL 101 102  CO2 31 29  BUN <3* 3*  CREATININE 0.57 0.53  GLU -- --   Liver Panel No results found for this basename: PROT:2,ALBUMIN:2,AST:2,ALT:2,ALKPHOS:2,BILITOT:2,BILIDIR:2,IBILI:2 in the last 72 hours Sedimentation Rate No results found for this basename: ESRSEDRATE in the last 72 hours C-Reactive Protein No results found for this basename: CRP:2 in the last 72 hours  Microbiology: Recent Results (from the past 240 hour(s))  CLOSTRIDIUM DIFFICILE BY PCR     Status: Normal   Collection Time   01/02/12 11:29 AM      Component Value Range Status Comment   C  difficile by pcr NEGATIVE  NEGATIVE  Final   EYE CULTURE     Status: Normal (Preliminary result)   Collection Time   01/03/12  1:45 PM      Component Value Range Status Comment   Specimen Description EYE LEFT   Final    Special Requests     Final    Value: VTREOUS PT ON VANC CEFTAZ DO SENS TO ALL GROWTH INCLUDING COAG NEG STAPH PER MD   Culture NO GROWTH 6 DAYS   Final    Report Status PENDING   Incomplete   EYE CULTURE     Status: Normal (Preliminary result)   Collection Time   01/03/12  1:45 PM      Component Value Range Status Comment   Specimen Description EYE LEFT   Final    Special Requests     Final    Value: ANTEROR CHAMBER PT ON VANC CEFTAZ DO SENS TO ALL GROWTH INCLUDING COAG NEG STAPH PER MD   Culture NO GROWTH 6 DAYS   Final    Report Status PENDING   Incomplete   CULTURE, BLOOD (ROUTINE X 2)     Status: Normal   Collection Time   01/03/12  8:32 PM      Component Value Range Status Comment   Specimen Description BLOOD RIGHT HAND   Final    Special Requests BOTTLES DRAWN AEROBIC ONLY 10CC   Final    Culture  Setup Time [id number]   Final    Culture NO GROWTH 5 DAYS   Final    Report Status 01/10/2012 FINAL   Final   CULTURE, BLOOD (ROUTINE X 2)     Status: Normal   Collection Time   01/03/12  8:39 PM      Component Value Range Status Comment   Specimen Description BLOOD RIGHT FOREARM   Final    Special Requests BOTTLES DRAWN AEROBIC ONLY 10CC   Final    Culture  Setup Time [id number]   Final    Culture NO GROWTH 5 DAYS   Final    Report Status 01/10/2012 FINAL   Final   CULTURE, SPUTUM-ASSESSMENT     Status: Normal   Collection Time   01/04/12  6:48 AM      Component Value Range Status Comment   Specimen Description SPUTUM   Final    Special Requests NONE   Final    Sputum evaluation     Final    Value: MICROSCOPIC FINDINGS SUGGEST THAT THIS SPECIMEN IS NOT REPRESENTATIVE OF LOWER RESPIRATORY SECRETIONS. PLEASE RECOLLECT.     CALLED TO [facility] RN 01/04/12 0800 COSTELLO B    Report Status 01/04/2012 FINAL   Final   MRSA PCR SCREENING     Status: Normal   Collection Time   01/04/12  4:20 PM      Component Value Range Status Comment   MRSA by PCR NEGATIVE  NEGATIVE  Final   CULTURE, ROUTINE-ABSCESS     Status: Normal   Collection Time   01/04/12  9:46 PM      Component Value Range Status Comment   Specimen Description ABSCESS BACK   Final    Special Requests PATIENT ON FOLLOWING LORTAZ, DIFLUCAN EPIDURAL   Final  Gram Stain     Final    Value: ABUNDANT WBC PRESENT,BOTH PMN AND MONONUCLEAR     NO ORGANISMS SEEN   Culture NO GROWTH 3 DAYS   Final    Report Status 01/08/2012 FINAL   Final   ANAEROBIC CULTURE     Status: Normal   Collection Time   01/04/12  9:46 PM      Component Value Range Status Comment   Specimen Description ABSCESS BACK   Final    Special Requests PATIENT ON FOLLOWING LORTAZ, DIFLUCAN EPIDURAL   Final    Gram Stain     Final    Value: ABUNDANT WBC PRESENT,BOTH PMN AND MONONUCLEAR     NO ORGANISMS SEEN     Performed at [facility]   Culture NO ANAEROBES ISOLATED   Final    Report Status 01/09/2012 FINAL   Final   GRAM STAIN     Status: Normal   Collection Time   01/04/12  9:46 PM      Component Value Range Status Comment   Specimen Description ABSCESS BACK   Final    Special Requests PATIENT ON FOLLOWING LORTAZ, DIFLUCAN EPIDURAL   Final    Gram Stain     Final    Value: ABUNDANT WBC PRESENT,BOTH PMN AND MONONUCLEAR     NO ORGANISMS SEEN   Report Status 01/04/2012 FINAL   Final   CULTURE, ROUTINE-ABSCESS     Status: Normal (Preliminary result)   Collection Time   01/07/12  4:14 PM      Component Value Range Status Comment   Specimen Description ABSCESS   Final    Special Requests PSOAS MUSCLE   Final    Gram Stain     Final    Value: MODERATE WBC PRESENT, PREDOMINANTLY PMN     NO SQUAMOUS EPITHELIAL CELLS SEEN     NO ORGANISMS SEEN   Culture NO GROWTH 3 DAYS   Final    Report Status PENDING   Incomplete   CULTURE,  ROUTINE-ABSCESS     Status: Normal (Preliminary result)   Collection Time   01/07/12  9:03 PM      Component Value Range Status Comment   Specimen Description ABSCESS EYE LEFT   Final    Special Requests INTRAOCULAR   Final    Gram Stain     Final    Value: ABUNDANT WBC PRESENT,BOTH PMN AND MONONUCLEAR     RARE GRAM POSITIVE COCCI IN PAIRS     Performed at [facility]   Culture NO GROWTH 2 DAYS   Final    Report Status PENDING   Incomplete   ANAEROBIC CULTURE     Status: Normal (Preliminary result)   Collection Time   01/07/12  9:03 PM      Component Value Range Status Comment   Specimen Description ABSCESS EYE LEFT   Final    Special Requests INTRAOCULAR   Final    Gram Stain     Final    Value: ABUNDANT WBC PRESENT,BOTH PMN AND MONONUCLEAR     RARE GRAM POSITIVE COCCI IN PAIRS     Performed at [facility]   Culture     Final    Value: NO ANAEROBES ISOLATED; CULTURE IN PROGRESS FOR 5 DAYS   Report Status PENDING   Incomplete   GRAM STAIN     Status: Normal   Collection Time   01/07/12  9:03 PM      Component  Value Range Status Comment   Specimen Description ABSCESS EYE LEFT   Final    Special Requests INTRAOCULAR   Final    Gram Stain     Final    Value: ABUNDANT WBC PRESENT,BOTH PMN AND MONONUCLEAR     RARE GRAM POSITIVE COCCI IN PAIRS   Report Status 01/07/2012 FINAL   Final     Studies/Results: Ct Maxillofacial W/cm  01/09/2012  *RADIOLOGY REPORT*  Clinical Data: Left periorbital cellulitis and swelling.  CT MAXILLOFACIAL WITH CONTRAST  Technique:  Multidetector CT imaging of the maxillofacial structures was performed with intravenous contrast. Multiplanar CT image reconstructions were also generated.  Contrast: 75mL OMNIPAQUE IOHEXOL 300 MG/ML IJ SOLN  Comparison: 01/07/2012 and multiple previous  Findings: There has been interval surgery.  Hyperdense structure covers the outer surface of the globe.  The globe appears to be filled with heterogeneous low density  material probably representing  packing.  Outer structures of the globe appear swollen and inflammed.  Post septal orbital fat inflammation remains evident.  Some inflammation is seen along the left optic nerve as well.  I cannot identify  any intra orbital undrained fluid collection. No evidence of intracranial extension of inflammatory disease.  Cavernous sinus regions appear symmetric.  IMPRESSION: Apparent surgical debridement of the left globe.  The globe appears to be filled with packing material.  Persistent inflammation of the outer layers of the globe.  Inflammatory type edema within the post- septal orbital fat but no evidence of undrained orbital fluid collection.  Original Report Authenticated By: Thomasenia Sales, M.D.     Assessment/Plan: Day 13 anbx  Pan-ophthalmitis  Epidural, Psoas abscess  Pneumonia  Multiple Cx's negative (abscess). Her eye gram stain shows some strep but cultures negative.    Terry Abila Infectious Diseases 01/10/2012, 4:54 PM

## 2012-01-10 NOTE — Progress Notes (Addendum)
Clinical Social Worker completed psychosocial assessment and placed in shadow chart. Clinical Social Worker spoke with pt in regard to discharge plans and current PT recommendation is for SNF. Pt would like to see how she progresses during admission and is hopeful to return home. Pt reports that she lives in a senior living community and has support from friends and neighbors close by. Clinical Social Worker to continue to follow and follow pt progress with PT and assist in determining pt discharge plan.   Jacklynn Lewis, MSW, LCSWA (covering) Clinical Social Work [phone]

## 2012-01-10 NOTE — Progress Notes (Signed)
PT Cancellation Note  Treatment cancelled today due to patient receiving procedure or test.  Pt just back from TEE and fatigued.  Will f/u as tolerated.  Newell Coral 01/10/2012, 1:22 PM  [phone]

## 2012-01-10 NOTE — Progress Notes (Signed)
*  PRELIMINARY RESULTS* Echocardiogram Echocardiogram Transesophageal has been performed.  Jeryl Columbia R 01/10/2012, 11:20 AM

## 2012-01-10 NOTE — Op Note (Signed)
MV normal.  No MR. TV normal  Trace TR AV normal  No AI PV normal  NO PI LV and RV function is normal LA, LAA without masses. 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  There is a small PFO as tested by injection of agitated saline with bubbles appearing in LA. Normal thoracic aorta. Pleural effusion present.

## 2012-01-10 NOTE — Progress Notes (Signed)
Subjective:  Had TEE this AM Ready for a break from procedures   Objective: Vital signs in last 24 hours: Filed Vitals:   01/10/12 1029 01/10/12 1030 01/10/12 1040 01/10/12 1050  BP: 130/67 130/67 127/68 131/68  Pulse:      Temp:      TempSrc:      Resp: 18 17 15 20   Height:      Weight:      SpO2: 94% 94% 92% 91%   Weight change: -4.2 kg (-9 lb 4.2 oz)  Intake/Output Summary (Last 24 hours) at 01/10/12 1110 Last data filed at 01/10/12 0801  Gross per 24 hour  Intake    680 ml  Output   2750 ml  Net  -2070 ml    Physical Exam: General: Awake, Oriented, comforatbale HEENT: L eye removed , covered Neck: Supple CV: S1 and S2, rrr Lungs: clear ant Abdomen: Soft, Nontender, Nondistended, +bowel sounds. Ext: Good pulses. No edema., weakness B/L   Lab Results:  Basename 01/10/12 0640 01/09/12 0610  NA 137 137  K 3.2* 3.2*  CL 101 102  CO2 31 29  GLUCOSE 91 90  BUN <3* 3*  CREATININE 0.57 0.53  CALCIUM 7.7* 7.3*  MG -- --  PHOS -- --   No results found for this basename: AST:2,ALT:2,ALKPHOS:2,BILITOT:2,PROT:2,ALBUMIN:2 in the last 72 hours No results found for this basename: LIPASE:2,AMYLASE:2 in the last 72 hours  Basename 01/10/12 0640 01/09/12 0610  WBC 7.3 7.8  NEUTROABS -- --  HGB 9.6* 9.7*  HCT 29.6* 29.7*  MCV 93.4 93.4  PLT 624* 654*   No results found for this basename: CKTOTAL:3,CKMB:3,CKMBINDEX:3,TROPONINI:3 in the last 72 hours No components found with this basename: POCBNP:3 No results found for this basename: DDIMER:2 in the last 72 hours No results found for this basename: HGBA1C:2 in the last 72 hours No results found for this basename: CHOL:2,HDL:2,LDLCALC:2,TRIG:2,CHOLHDL:2,LDLDIRECT:2 in the last 72 hours No results found for this basename: TSH,T4TOTAL,FREET3,T3FREE,THYROIDAB in the last 72 hours No results found for this basename: VITAMINB12:2,FOLATE:2,FERRITIN:2,TIBC:2,IRON:2,RETICCTPCT:2 in the last 72 hours  Micro  Results: Recent Results (from the past 240 hour(s))  CULTURE, SPUTUM-ASSESSMENT     Status: Normal   Collection Time   12/31/11  2:15 PM      Component Value Range Status Comment   Specimen Description SPUTUM   Final    Special Requests NONE   Final    Sputum evaluation     Final    Value: THIS SPECIMEN IS ACCEPTABLE. RESPIRATORY CULTURE REPORT TO FOLLOW.   Report Status 12/31/2011 FINAL   Final   CULTURE, RESPIRATORY     Status: Normal   Collection Time   12/31/11  2:15 PM      Component Value Range Status Comment   Specimen Description SPUTUM   Final    Special Requests NONE   Final    Gram Stain     Final    Value: NO WBC SEEN     NO SQUAMOUS EPITHELIAL CELLS SEEN     FEW YEAST   Culture NORMAL OROPHARYNGEAL FLORA   Final    Report Status 01/03/2012 FINAL   Final   STOOL CULTURE     Status: Normal   Collection Time   12/31/11  2:45 PM      Component Value Range Status Comment   Specimen Description STOOL   Final    Special Requests NONE   Final    Culture     Final  Value: NO SALMONELLA, SHIGELLA, CAMPYLOBACTER, OR YERSINIA ISOLATED   Report Status 01/04/2012 FINAL   Final   CLOSTRIDIUM DIFFICILE BY PCR     Status: Normal   Collection Time   01/02/12 11:29 AM      Component Value Range Status Comment   C difficile by pcr NEGATIVE  NEGATIVE  Final   EYE CULTURE     Status: Normal (Preliminary result)   Collection Time   01/03/12  1:45 PM      Component Value Range Status Comment   Specimen Description EYE LEFT   Final    Special Requests     Final    Value: VTREOUS PT ON VANC CEFTAZ DO SENS TO ALL GROWTH INCLUDING COAG NEG STAPH PER MD   Culture NO GROWTH 6 DAYS   Final    Report Status PENDING   Incomplete   EYE CULTURE     Status: Normal (Preliminary result)   Collection Time   01/03/12  1:45 PM      Component Value Range Status Comment   Specimen Description EYE LEFT   Final    Special Requests     Final    Value: ANTEROR CHAMBER PT ON VANC CEFTAZ DO SENS TO ALL GROWTH  INCLUDING COAG NEG STAPH PER MD   Culture NO GROWTH 6 DAYS   Final    Report Status PENDING   Incomplete   CULTURE, BLOOD (ROUTINE X 2)     Status: Normal   Collection Time   01/03/12  8:32 PM      Component Value Range Status Comment   Specimen Description BLOOD RIGHT HAND   Final    Special Requests BOTTLES DRAWN AEROBIC ONLY 10CC   Final    Culture  Setup Time [id number]   Final    Culture NO GROWTH 5 DAYS   Final    Report Status 01/10/2012 FINAL   Final   CULTURE, BLOOD (ROUTINE X 2)     Status: Normal   Collection Time   01/03/12  8:39 PM      Component Value Range Status Comment   Specimen Description BLOOD RIGHT FOREARM   Final    Special Requests BOTTLES DRAWN AEROBIC ONLY 10CC   Final    Culture  Setup Time [id number]   Final    Culture NO GROWTH 5 DAYS   Final    Report Status 01/10/2012 FINAL   Final   CULTURE, SPUTUM-ASSESSMENT     Status: Normal   Collection Time   01/04/12  6:48 AM      Component Value Range Status Comment   Specimen Description SPUTUM   Final    Special Requests NONE   Final    Sputum evaluation     Final    Value: MICROSCOPIC FINDINGS SUGGEST THAT THIS SPECIMEN IS NOT REPRESENTATIVE OF LOWER RESPIRATORY SECRETIONS. PLEASE RECOLLECT.     CALLED TO [facility] RN 01/04/12 0800 COSTELLO B   Report Status 01/04/2012 FINAL   Final   MRSA PCR SCREENING     Status: Normal   Collection Time   01/04/12  4:20 PM      Component Value Range Status Comment   MRSA by PCR NEGATIVE  NEGATIVE  Final   CULTURE, ROUTINE-ABSCESS     Status: Normal   Collection Time   01/04/12  9:46 PM      Component Value Range Status Comment   Specimen Description ABSCESS BACK   Final    Special Requests PATIENT  ON FOLLOWING LORTAZ, DIFLUCAN EPIDURAL   Final    Gram Stain     Final    Value: ABUNDANT WBC PRESENT,BOTH PMN AND MONONUCLEAR     NO ORGANISMS SEEN   Culture NO GROWTH 3 DAYS   Final    Report Status 01/08/2012 FINAL   Final   ANAEROBIC CULTURE     Status: Normal   Collection  Time   01/04/12  9:46 PM      Component Value Range Status Comment   Specimen Description ABSCESS BACK   Final    Special Requests PATIENT ON FOLLOWING LORTAZ, DIFLUCAN EPIDURAL   Final    Gram Stain     Final    Value: ABUNDANT WBC PRESENT,BOTH PMN AND MONONUCLEAR     NO ORGANISMS SEEN     Performed at [facility]   Culture NO ANAEROBES ISOLATED   Final    Report Status 01/09/2012 FINAL   Final   GRAM STAIN     Status: Normal   Collection Time   01/04/12  9:46 PM      Component Value Range Status Comment   Specimen Description ABSCESS BACK   Final    Special Requests PATIENT ON FOLLOWING LORTAZ, DIFLUCAN EPIDURAL   Final    Gram Stain     Final    Value: ABUNDANT WBC PRESENT,BOTH PMN AND MONONUCLEAR     NO ORGANISMS SEEN   Report Status 01/04/2012 FINAL   Final   CULTURE, ROUTINE-ABSCESS     Status: Normal (Preliminary result)   Collection Time   01/07/12  4:14 PM      Component Value Range Status Comment   Specimen Description ABSCESS   Final    Special Requests PSOAS MUSCLE   Final    Gram Stain     Final    Value: MODERATE WBC PRESENT, PREDOMINANTLY PMN     NO SQUAMOUS EPITHELIAL CELLS SEEN     NO ORGANISMS SEEN   Culture NO GROWTH 3 DAYS   Final    Report Status PENDING   Incomplete   CULTURE, ROUTINE-ABSCESS     Status: Normal (Preliminary result)   Collection Time   01/07/12  9:03 PM      Component Value Range Status Comment   Specimen Description ABSCESS EYE LEFT   Final    Special Requests INTRAOCULAR   Final    Gram Stain     Final    Value: ABUNDANT WBC PRESENT,BOTH PMN AND MONONUCLEAR     RARE GRAM POSITIVE COCCI IN PAIRS     Performed at [facility]   Culture NO GROWTH 2 DAYS   Final    Report Status PENDING   Incomplete   ANAEROBIC CULTURE     Status: Normal (Preliminary result)   Collection Time   01/07/12  9:03 PM      Component Value Range Status Comment   Specimen Description ABSCESS EYE LEFT   Final    Special Requests INTRAOCULAR   Final     Gram Stain     Final    Value: ABUNDANT WBC PRESENT,BOTH PMN AND MONONUCLEAR     RARE GRAM POSITIVE COCCI IN PAIRS     Performed at [facility]   Culture     Final    Value: NO ANAEROBES ISOLATED; CULTURE IN PROGRESS FOR 5 DAYS   Report Status PENDING   Incomplete   GRAM STAIN     Status: Normal   Collection Time  01/07/12  9:03 PM      Component Value Range Status Comment   Specimen Description ABSCESS EYE LEFT   Final    Special Requests INTRAOCULAR   Final    Gram Stain     Final    Value: ABUNDANT WBC PRESENT,BOTH PMN AND MONONUCLEAR     RARE GRAM POSITIVE COCCI IN PAIRS   Report Status 01/07/2012 FINAL   Final     Studies/Results: Ct Maxillofacial W/cm  01/09/2012  *RADIOLOGY REPORT*  Clinical Data: Left periorbital cellulitis and swelling.  CT MAXILLOFACIAL WITH CONTRAST  Technique:  Multidetector CT imaging of the maxillofacial structures was performed with intravenous contrast. Multiplanar CT image reconstructions were also generated.  Contrast: 75mL OMNIPAQUE IOHEXOL 300 MG/ML IJ SOLN  Comparison: 01/07/2012 and multiple previous  Findings: There has been interval surgery.  Hyperdense structure covers the outer surface of the globe.  The globe appears to be filled with heterogeneous low density material probably representing  packing.  Outer structures of the globe appear swollen and inflammed.  Post septal orbital fat inflammation remains evident.  Some inflammation is seen along the left optic nerve as well.  I cannot identify  any intra orbital undrained fluid collection. No evidence of intracranial extension of inflammatory disease.  Cavernous sinus regions appear symmetric.  IMPRESSION: Apparent surgical debridement of the left globe.  The globe appears to be filled with packing material.  Persistent inflammation of the outer layers of the globe.  Inflammatory type edema within the post- septal orbital fat but no evidence of undrained orbital fluid collection.  Original  Report Authenticated By: Thomasenia Sales, M.D.    Medications: I have reviewed the patient's current medications. Scheduled Meds:    . artificial tears   Left Eye Q6H WA  . bacitracin-polymyxin b   Left Eye Q6H WA  . ceFAZolin  1 mL Subconjunctival Once  . ceFAZolin  200 mg Subconjunctival Once  . cefTAZidime (FORTAZ)  IV  1 g Intravenous Q8H  . enoxaparin (LOVENOX) injection  40 mg Subcutaneous Q24H  . feeding supplement  237 mL Oral BID  . fentaNYL  250 mcg Intravenous Once  . gadobenate dimeglumine  5 mL Intravenous Once  . midazolam  10 mg Intravenous Once  . pantoprazole  40 mg Oral BID AC  . sodium chloride  3 mL Intravenous Q12H  . sodium chloride  3 mL Intravenous Q12H  . vancomycin  750 mg Intravenous Q8H   Continuous Infusions:    . sodium chloride     PRN Meds:.sodium chloride, acetaminophen, acetaminophen, alum & mag hydroxide-simeth, benzocaine, HYDROcodone-acetaminophen, HYDROmorphone (DILAUDID) injection, ondansetron (ZOFRAN) IV, ondansetron, sodium chloride, DISCONTD: butamben-tetracaine-benzocaine, DISCONTD: fentaNYL, DISCONTD: midazolam  Assessment/Plan: Assessment:  29 female with chr low back pain and hx of hep B presents with 2 days hx of painless left eye blindness with hx of nausea. Vomiting and diarrhea 1 week back with findings of left anterior uveitis and multilobar left sided pneumonia on CT scan.  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  Initially thought to be possible autoimmune , however her uveitis appears much worse since 3/3  and concerning for endophthalmitis   MRI orbit shows panopthalmitis.  abx coverage broadened with IV vanco and cefepime ( 3/3) . Also added fluconazole for fungal coverage on 3/4 has since been D/C'd -Dr Randon Goldsmith performed vitrectomy on 3/4 with cx sent and will follow. 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   Hypokalemia repleat  Diarrhea  Prior to admission  c diff negative  resolved    DVT prophylaxis: lovenox SCD   Full code   Consult:  opthalmology :[place] ID : [place]  Pulmonary :[place]  Need to clarify abx and length of antibiotics with ID as nothing has grown on cultures thus far.    LOS: 11 days  Dione Mccombie, DO 01/10/2012, 11:10 AM

## 2012-01-10 NOTE — Progress Notes (Signed)
Patient ID: Donna Tapia, female   DOB: November 23, 1950, 61 y.o.   MRN: [id number]  S: No new eye complaints.  No fevers, no chills.  +low back pain.   Relevant meds:  IV: Vanc, Fortax  Topical: poly/bac ung alternating with lacrilube Q 3 hrs.   Exam:   Motility OS:-3 supra- and AB- duction; -2 infra- and AD- duction.   Lighted bedside exam  OD  External/adnexa: Normal  Lids/lashes: Normal  Conjunctiva White, quiet  Cornea: Clear  OS  External/adnexa: 1+ periocular erythema, 1+ edema. Mild restriction to retropulsion of orbital contents, improved c/t exam 24 hrs prior.  Lids/lashes: 2+ edema and 1+ erythema; upper and lower bolstered temporal tarsorrhaphy in place; small amount of exposed/prolapsed conj nasally (improved); Conformer in place with iodoform gauze extending through central opening.  Conjunctiva: small amount of nasal exposure. 2+ hyperemia and post-surgical change. Covered by conformer.  Relevant LABS:  WBC: 7.3  Micro:  Blood, lumbosacral epidural abscess, psoas abscess, AC/Vit: No growth.  Intraocular contents gram stain: GPC in pairs  Intraocular contents culture: No growth.   Imaging:  CT MXF 01/09/12: Orbital cellulitis; no intraorbital abscess. Nml cavernous sinus, brain. TEE: nml valves.   .  Assessment and Plan:  -- Endogenous endophthalmitis/panophthalmitis OS.  POD #3 s/p evisceration OS.  Orbital signs continue to improve.   Rec:  Continue Poly/bac alternating with lacrlilube to small amount of exposed conj OS.  Continue broad spectrum IV abx.   Follow cultures closely.  Will soon begin to remove some of the packing OS.

## 2012-01-11 ENCOUNTER — Encounter (HOSPITAL_COMMUNITY): Payer: Self-pay | Admitting: Internal Medicine

## 2012-01-11 LAB — EYE CULTURE: Culture: NO GROWTH

## 2012-01-11 LAB — CBC
HCT: 30.8 % — ABNORMAL LOW (ref 36.0–46.0)
Hemoglobin: 10.2 g/dL — ABNORMAL LOW (ref 12.0–15.0)
MCH: 31 pg (ref 26.0–34.0)
MCV: 93.6 fL (ref 78.0–100.0)
RBC: 3.29 MIL/uL — ABNORMAL LOW (ref 3.87–5.11)

## 2012-01-11 LAB — CULTURE, ROUTINE-ABSCESS: Culture: NO GROWTH

## 2012-01-11 LAB — BASIC METABOLIC PANEL
CO2: 30 mEq/L (ref 19–32)
Calcium: 8.1 mg/dL — ABNORMAL LOW (ref 8.4–10.5)
Creatinine, Ser: 0.55 mg/dL (ref 0.50–1.10)
Glucose, Bld: 83 mg/dL (ref 70–99)

## 2012-01-11 MED ORDER — DIPHENHYDRAMINE HCL 50 MG/ML IJ SOLN
12.5000 mg | Freq: Four times a day (QID) | INTRAMUSCULAR | Status: AC | PRN
Start: 2012-01-11 — End: 2012-01-12
  Administered 2012-01-11 – 2012-01-12 (×4): 12.5 mg via INTRAVENOUS
  Filled 2012-01-11 (×4): qty 1

## 2012-01-11 NOTE — Progress Notes (Signed)
Physical Therapy Note   01/11/12 1100  PT Visit Information  Last PT Received On 01/11/12  Precautions  Precautions Fall  Restrictions  Weight Bearing Restrictions No  Bed Mobility  Bed Mobility No (pt sitting EOB)  Transfers  Transfers Yes  Sit to Stand 4: Min assist;With upper extremity assist;From bed  Sit to Stand Details (indicate cue type and reason) demos good use of UEs  Stand to Sit 5: Supervision;With upper extremity assist;To bed  Stand to Sit Details cues to attend to object on L side  Ambulation/Gait  Ambulation/Gait Yes  Ambulation/Gait Assistance 4: Min assist  Ambulation/Gait Assistance Details (indicate cue type and reason) ceus for upright posture, attention to L side and to visually scan environment.    Ambulation Distance (Feet) 160 Feet  Assistive device 1 person hand held assist  Gait Pattern Decreased stride length;Trunk flexed (Very rigid and cautious.  )  Stairs No  Wheelchair Mobility  Wheelchair Mobility No  Posture/Postural Control  Posture/Postural Control No significant limitations  Balance  Balance Assessed No  Static Standing Balance  Static Standing - Balance Support No upper extremity supported  Static Standing - Level of Assistance 4: Min assist  Dynamic Standing Balance  Dynamic Standing - Balance Support Left upper extremity supported  Dynamic Standing - Level of Assistance 4: Min assist  PT - End of Session  Equipment Utilized During Treatment Gait belt  Activity Tolerance Patient limited by fatigue  Patient left in bed;with call bell in reach;with bed alarm set (Sitting EOB)  Nurse Communication Mobility status for ambulation  General  Behavior During Session [facility] for tasks performed  Cognition [facility] for tasks performed  PT - Assessment/Plan  Comments on Treatment Session pt presents with L eye infection and removal, L psoas and back abscess s/p I+D.  pt still unsteady with activity and needs cues to attend to objects and  environment on her L side.  Feel pt would benefit from ST-SNF to increase safety prior to D/C to home.    PT Plan Discharge plan remains appropriate;Frequency remains appropriate  PT Frequency Min 3X/week  Follow Up Recommendations Skilled nursing facility  Equipment Recommended Defer to next venue  Acute Rehab PT Goals  PT Goal: Sit to Stand - Progress Progressing toward goal  PT Goal: Stand to Sit - Progress Progressing toward goal  PT Goal: Ambulate - Progress Progressing toward goal    Mack Hook, PT [phone]

## 2012-01-11 NOTE — Progress Notes (Signed)
Patient ID: Donna Tapia, female   DOB: 05-17-1951, 61 y.o.   MRN: [id number] Doing much better. Does not look toxic anymore. Pain decreasing, activity increasing. Wound not checked because of drain from psoas abscess nearby. Will continue present rx. Will probably need at least 2 months of IV anti biotics.

## 2012-01-11 NOTE — Progress Notes (Signed)
ANTIBIOTIC CONSULT NOTE - FOLLOW UP  Pharmacy Consult for Vancomycin Indication: panopthalmitis, epidural and psoas abscess  Allergies  Allergen Reactions  . Sulfur     Patient Measurements: Height: 5\' 9"  (175.3 cm) Weight: 148 lb 2.4 oz (67.2 kg) IBW/kg (Calculated) : 66.2   Vital Signs: Temp: 98.2 F (36.8 C) (03/12 0514) Temp src: Oral (03/12 0514) BP: 138/67 mmHg (03/12 0514) Pulse Rate: 72  (03/12 0514) Intake/Output from previous day: 03/11 0701 - 03/12 0700 In: 780 [P.O.:480; IV Piggyback:300] Out: 2330 [Urine:2300; Drains:30] Intake/Output from this shift: Total I/O In: 480 [P.O.:480] Out: -   Labs:  Basename 01/11/12 0540 01/10/12 0640 01/09/12 0610  WBC 5.9 7.3 7.8  HGB 10.2* 9.6* 9.7*  PLT 600* 624* 654*  LABCREA -- -- --  CREATININE 0.55 0.57 0.53   Estimated Creatinine Clearance: 76.2 ml/min (by C-G formula based on Cr of 0.55).  Basename 01/08/12 1700  VANCOTROUGH 16.8  VANCOPEAK --  Drue Dun --  GENTTROUGH --  GENTPEAK --  GENTRANDOM --  TOBRATROUGH --  TOBRAPEAK --  TOBRARND --  AMIKACINPEAK --  AMIKACINTROU --  AMIKACIN --     Microbiology: Recent Results (from the past 720 hour(s))  URINE CULTURE     Status: Normal   Collection Time   12/31/11  4:15 AM      Component Value Range Status Comment   Specimen Description URINE, CLEAN CATCH   Final    Special Requests NONE   Final    Culture  Setup Time [id number]   Final    Colony Count NO GROWTH   Final    Culture NO GROWTH   Final    Report Status 01/01/2012 FINAL   Final   CULTURE, BLOOD (ROUTINE X 2)     Status: Normal   Collection Time   12/31/11  8:42 AM      Component Value Range Status Comment   Specimen Description BLOOD LEFT ARM   Final    Special Requests BOTTLES DRAWN AEROBIC ONLY 10CC   Final    Culture  Setup Time [id number]   Final    Culture NO GROWTH 5 DAYS   Final    Report Status 01/06/2012 FINAL   Final   CULTURE, BLOOD (ROUTINE X 2)     Status: Normal   Collection Time   12/31/11  8:48 AM      Component Value Range Status Comment   Specimen Description BLOOD LEFT HAND   Final    Special Requests BOTTLES DRAWN AEROBIC ONLY 10CC   Final    Culture  Setup Time [id number]   Final    Culture NO GROWTH 5 DAYS   Final    Report Status 01/06/2012 FINAL   Final   CULTURE, SPUTUM-ASSESSMENT     Status: Normal   Collection Time   12/31/11  2:15 PM      Component Value Range Status Comment   Specimen Description SPUTUM   Final    Special Requests NONE   Final    Sputum evaluation     Final    Value: THIS SPECIMEN IS ACCEPTABLE. RESPIRATORY CULTURE REPORT TO FOLLOW.   Report Status 12/31/2011 FINAL   Final   CULTURE, RESPIRATORY     Status: Normal   Collection Time   12/31/11  2:15 PM      Component Value Range Status Comment   Specimen Description SPUTUM   Final    Special Requests NONE   Final    Gram  Stain     Final    Value: NO WBC SEEN     NO SQUAMOUS EPITHELIAL CELLS SEEN     FEW YEAST   Culture NORMAL OROPHARYNGEAL FLORA   Final    Report Status 01/03/2012 FINAL   Final   STOOL CULTURE     Status: Normal   Collection Time   12/31/11  2:45 PM      Component Value Range Status Comment   Specimen Description STOOL   Final    Special Requests NONE   Final    Culture     Final    Value: NO SALMONELLA, SHIGELLA, CAMPYLOBACTER, OR YERSINIA ISOLATED   Report Status 01/04/2012 FINAL   Final   CLOSTRIDIUM DIFFICILE BY PCR     Status: Normal   Collection Time   01/02/12 11:29 AM      Component Value Range Status Comment   C difficile by pcr NEGATIVE  NEGATIVE  Final   EYE CULTURE     Status: Normal   Collection Time   01/03/12  1:45 PM      Component Value Range Status Comment   Specimen Description EYE LEFT   Final    Special Requests     Final    Value: VTREOUS PT ON VANC CEFTAZ DO SENS TO ALL GROWTH INCLUDING COAG NEG STAPH PER MD   Culture NO GROWTH 7 DAYS   Final    Report Status 01/11/2012 FINAL   Final   EYE CULTURE     Status:  Normal   Collection Time   01/03/12  1:45 PM      Component Value Range Status Comment   Specimen Description EYE LEFT   Final    Special Requests     Final    Value: ANTEROR CHAMBER PT ON VANC CEFTAZ DO SENS TO ALL GROWTH INCLUDING COAG NEG STAPH PER MD   Culture NO GROWTH 7 DAYS   Final    Report Status 01/11/2012 FINAL   Final   CULTURE, BLOOD (ROUTINE X 2)     Status: Normal   Collection Time   01/03/12  8:32 PM      Component Value Range Status Comment   Specimen Description BLOOD RIGHT HAND   Final    Special Requests BOTTLES DRAWN AEROBIC ONLY 10CC   Final    Culture  Setup Time [id number]   Final    Culture NO GROWTH 5 DAYS   Final    Report Status 01/10/2012 FINAL   Final   CULTURE, BLOOD (ROUTINE X 2)     Status: Normal   Collection Time   01/03/12  8:39 PM      Component Value Range Status Comment   Specimen Description BLOOD RIGHT FOREARM   Final    Special Requests BOTTLES DRAWN AEROBIC ONLY 10CC   Final    Culture  Setup Time [id number]   Final    Culture NO GROWTH 5 DAYS   Final    Report Status 01/10/2012 FINAL   Final   CULTURE, SPUTUM-ASSESSMENT     Status: Normal   Collection Time   01/04/12  6:48 AM      Component Value Range Status Comment   Specimen Description SPUTUM   Final    Special Requests NONE   Final    Sputum evaluation     Final    Value: MICROSCOPIC FINDINGS SUGGEST THAT THIS SPECIMEN IS NOT REPRESENTATIVE OF LOWER RESPIRATORY SECRETIONS. PLEASE RECOLLECT.  CALLED TO [facility] RN 01/04/12 0800 COSTELLO B   Report Status 01/04/2012 FINAL   Final   MRSA PCR SCREENING     Status: Normal   Collection Time   01/04/12  4:20 PM      Component Value Range Status Comment   MRSA by PCR NEGATIVE  NEGATIVE  Final   CULTURE, ROUTINE-ABSCESS     Status: Normal   Collection Time   01/04/12  9:46 PM      Component Value Range Status Comment   Specimen Description ABSCESS BACK   Final    Special Requests PATIENT ON FOLLOWING LORTAZ, DIFLUCAN EPIDURAL   Final     Gram Stain     Final    Value: ABUNDANT WBC PRESENT,BOTH PMN AND MONONUCLEAR     NO ORGANISMS SEEN   Culture NO GROWTH 3 DAYS   Final    Report Status 01/08/2012 FINAL   Final   ANAEROBIC CULTURE     Status: Normal   Collection Time   01/04/12  9:46 PM      Component Value Range Status Comment   Specimen Description ABSCESS BACK   Final    Special Requests PATIENT ON FOLLOWING LORTAZ, DIFLUCAN EPIDURAL   Final    Gram Stain     Final    Value: ABUNDANT WBC PRESENT,BOTH PMN AND MONONUCLEAR     NO ORGANISMS SEEN     Performed at [facility]   Culture NO ANAEROBES ISOLATED   Final    Report Status 01/09/2012 FINAL   Final   GRAM STAIN     Status: Normal   Collection Time   01/04/12  9:46 PM      Component Value Range Status Comment   Specimen Description ABSCESS BACK   Final    Special Requests PATIENT ON FOLLOWING LORTAZ, DIFLUCAN EPIDURAL   Final    Gram Stain     Final    Value: ABUNDANT WBC PRESENT,BOTH PMN AND MONONUCLEAR     NO ORGANISMS SEEN   Report Status 01/04/2012 FINAL   Final   CULTURE, ROUTINE-ABSCESS     Status: Normal (Preliminary result)   Collection Time   01/07/12  4:14 PM      Component Value Range Status Comment   Specimen Description ABSCESS   Final    Special Requests PSOAS MUSCLE   Final    Gram Stain     Final    Value: MODERATE WBC PRESENT, PREDOMINANTLY PMN     NO SQUAMOUS EPITHELIAL CELLS SEEN     NO ORGANISMS SEEN   Culture NO GROWTH 3 DAYS   Final    Report Status PENDING   Incomplete   CULTURE, ROUTINE-ABSCESS     Status: Normal   Collection Time   01/07/12  9:03 PM      Component Value Range Status Comment   Specimen Description ABSCESS EYE LEFT   Final    Special Requests INTRAOCULAR   Final    Gram Stain     Final    Value: ABUNDANT WBC PRESENT,BOTH PMN AND MONONUCLEAR     RARE GRAM POSITIVE COCCI IN PAIRS     Performed at [facility]   Culture NO GROWTH 3 DAYS   Final    Report Status 01/11/2012 FINAL   Final   ANAEROBIC  CULTURE     Status: Normal (Preliminary result)   Collection Time   01/07/12  9:03 PM      Component Value Range Status  Comment   Specimen Description ABSCESS EYE LEFT   Final    Special Requests INTRAOCULAR   Final    Gram Stain     Final    Value: ABUNDANT WBC PRESENT,BOTH PMN AND MONONUCLEAR     RARE GRAM POSITIVE COCCI IN PAIRS     Performed at [facility]   Culture     Final    Value: NO ANAEROBES ISOLATED; CULTURE IN PROGRESS FOR 5 DAYS   Report Status PENDING   Incomplete   GRAM STAIN     Status: Normal   Collection Time   01/07/12  9:03 PM      Component Value Range Status Comment   Specimen Description ABSCESS EYE LEFT   Final    Special Requests INTRAOCULAR   Final    Gram Stain     Final    Value: ABUNDANT WBC PRESENT,BOTH PMN AND MONONUCLEAR     RARE GRAM POSITIVE COCCI IN PAIRS   Report Status 01/07/2012 FINAL   Final     Assessment: Pt 61 yo female on Day#10 Vanc/Fortaz for panophthalmitis, epidural abscess and psoas abscess. Renal function stable, est. crcl ~ 75. Multiple cultures are negative. Vancomycin trough therapeutic.  Goal of Therapy:  Vancomycin trough level 15-20 mcg/ml  Plan:  - continue vancomycin at 750 mg IV Q8hrs - continue fortaz 1gm IV q8h - will continue to follow renal function, cultures

## 2012-01-11 NOTE — Progress Notes (Signed)
Patient ID: Donna Tapia, female   DOB: Oct 20, 1951, 61 y.o.   MRN: [id number]   S: No new eye complaints. Developed rash and Elita Quick was held overnight.    Relevant meds:  IV: Vanc Topical: poly/bac ung alternating with lacrilube Q 3 hrs.  Exam:  Motility OS:-2 supra- and AB- duction; -2 infra- and AD- duction.  Lighted bedside exam  OD  External/adnexa: Normal  Lids/lashes: Normal  Conjunctiva White, quiet  Cornea: Clear  OS  External/adnexa: 1+ periocular erythema, 1+ edema.  Minimal estriction to retropulsion of orbital contents Lids/lashes: 1+ edema and trace erythema; upper and lower bolstered temporal tarsorrhaphy in place; small amount of exposed/prolapsed conj nasally (improved); Conformer in place with iodoform gauze extending through central opening.  Conjunctiva: small amount of nasal exposure. 1+ hyperemia and post-surgical change. Covered by conformer.   Relevant LABS:  WBC: 5.9  Path:  Intraocular contents with suppurative inflammation; no mass//malignancy.   Micro:  Blood, lumbosacral epidural abscess, psoas abscess, AC/Vit: No growth.  Intraocular contents gram stain: GPC in pairs  Intraocular contents culture: No growth.   Imaging:  CT MXF 01/09/12: Orbital cellulitis; no intraorbital abscess. Nml cavernous sinus, brain.  TEE: nml valves.  .  Assessment and Plan:  -- Endogenous endophthalmitis/panophthalmitis OS.  POD #4 s/p evisceration OS.  Ms Mares continues to demonstrate clinical improvement.    ~1/3 of the iodoform gauze removed at the the bedside with out difficulty.    Rec:  Continue Poly/bac alternating with lacrlilube to small amount of exposed conj OS.  Continue broad spectrum IV abx.  NeuroSx recs are for 2 months.   Will continue to follow cultures, though nothing has grown from any specimen sites.

## 2012-01-11 NOTE — Progress Notes (Signed)
Subjective:  Started having an itchy red rash last night - fortaz stopped by night coverage- has had some improvement with benadryl No fevers, no chills   Objective: Vital signs in last 24 hours: Filed Vitals:   01/10/12 1050 01/10/12 1445 01/10/12 2139 01/11/12 0514  BP: 131/68 121/69 148/76 138/67  Pulse:  98 85 72  Temp:  98.2 F (36.8 C) 98.4 F (36.9 C) 98.2 F (36.8 C)  TempSrc:   Oral Oral  Resp: 20 18 18 17   Height:      Weight:    67.2 kg (148 lb 2.4 oz)  SpO2: 91% 96% 94% 97%   Weight change: 3.8 kg (8 lb 6 oz)  Intake/Output Summary (Last 24 hours) at 01/11/12 1037 Last data filed at 01/11/12 8119  Gross per 24 hour  Intake   1260 ml  Output   2030 ml  Net   -770 ml    Physical Exam: General: Awake, Oriented, comforatbale HEENT: L eye removed , covered Neck: Supple CV: S1 and S2, rrr Lungs: clear ant Abdomen: Soft, Nontender, Nondistended, +bowel sounds. Ext: Good pulses. No edema., weakness B/L   Lab Results:  Basename 01/11/12 0540 01/10/12 0640  NA 137 137  K 3.8 3.2*  CL 101 101  CO2 30 31  GLUCOSE 83 91  BUN 4* <3*  CREATININE 0.55 0.57  CALCIUM 8.1* 7.7*  MG -- --  PHOS -- --   No results found for this basename: AST:2,ALT:2,ALKPHOS:2,BILITOT:2,PROT:2,ALBUMIN:2 in the last 72 hours No results found for this basename: LIPASE:2,AMYLASE:2 in the last 72 hours  Basename 01/11/12 0540 01/10/12 0640  WBC 5.9 7.3  NEUTROABS -- --  HGB 10.2* 9.6*  HCT 30.8* 29.6*  MCV 93.6 93.4  PLT 600* 624*   No results found for this basename: CKTOTAL:3,CKMB:3,CKMBINDEX:3,TROPONINI:3 in the last 72 hours No components found with this basename: POCBNP:3 No results found for this basename: DDIMER:2 in the last 72 hours No results found for this basename: HGBA1C:2 in the last 72 hours No results found for this basename: CHOL:2,HDL:2,LDLCALC:2,TRIG:2,CHOLHDL:2,LDLDIRECT:2 in the last 72 hours No results found for this basename:  TSH,T4TOTAL,FREET3,T3FREE,THYROIDAB in the last 72 hours No results found for this basename: VITAMINB12:2,FOLATE:2,FERRITIN:2,TIBC:2,IRON:2,RETICCTPCT:2 in the last 72 hours  Micro Results: Recent Results (from the past 240 hour(s))  CLOSTRIDIUM DIFFICILE BY PCR     Status: Normal   Collection Time   01/02/12 11:29 AM      Component Value Range Status Comment   C difficile by pcr NEGATIVE  NEGATIVE  Final   EYE CULTURE     Status: Normal   Collection Time   01/03/12  1:45 PM      Component Value Range Status Comment   Specimen Description EYE LEFT   Final    Special Requests     Final    Value: VTREOUS PT ON VANC CEFTAZ DO SENS TO ALL GROWTH INCLUDING COAG NEG STAPH PER MD   Culture NO GROWTH 7 DAYS   Final    Report Status 01/11/2012 FINAL   Final   EYE CULTURE     Status: Normal   Collection Time   01/03/12  1:45 PM      Component Value Range Status Comment   Specimen Description EYE LEFT   Final    Special Requests     Final    Value: ANTEROR CHAMBER PT ON VANC CEFTAZ DO SENS TO ALL GROWTH INCLUDING COAG NEG STAPH PER MD   Culture NO GROWTH 7 DAYS  Final    Report Status 01/11/2012 FINAL   Final   CULTURE, BLOOD (ROUTINE X 2)     Status: Normal   Collection Time   01/03/12  8:32 PM      Component Value Range Status Comment   Specimen Description BLOOD RIGHT HAND   Final    Special Requests BOTTLES DRAWN AEROBIC ONLY 10CC   Final    Culture  Setup Time [id number]   Final    Culture NO GROWTH 5 DAYS   Final    Report Status 01/10/2012 FINAL   Final   CULTURE, BLOOD (ROUTINE X 2)     Status: Normal   Collection Time   01/03/12  8:39 PM      Component Value Range Status Comment   Specimen Description BLOOD RIGHT FOREARM   Final    Special Requests BOTTLES DRAWN AEROBIC ONLY 10CC   Final    Culture  Setup Time [id number]   Final    Culture NO GROWTH 5 DAYS   Final    Report Status 01/10/2012 FINAL   Final   CULTURE, SPUTUM-ASSESSMENT     Status: Normal   Collection Time    01/04/12  6:48 AM      Component Value Range Status Comment   Specimen Description SPUTUM   Final    Special Requests NONE   Final    Sputum evaluation     Final    Value: MICROSCOPIC FINDINGS SUGGEST THAT THIS SPECIMEN IS NOT REPRESENTATIVE OF LOWER RESPIRATORY SECRETIONS. PLEASE RECOLLECT.     CALLED TO [facility] RN 01/04/12 0800 COSTELLO B   Report Status 01/04/2012 FINAL   Final   MRSA PCR SCREENING     Status: Normal   Collection Time   01/04/12  4:20 PM      Component Value Range Status Comment   MRSA by PCR NEGATIVE  NEGATIVE  Final   CULTURE, ROUTINE-ABSCESS     Status: Normal   Collection Time   01/04/12  9:46 PM      Component Value Range Status Comment   Specimen Description ABSCESS BACK   Final    Special Requests PATIENT ON FOLLOWING LORTAZ, DIFLUCAN EPIDURAL   Final    Gram Stain     Final    Value: ABUNDANT WBC PRESENT,BOTH PMN AND MONONUCLEAR     NO ORGANISMS SEEN   Culture NO GROWTH 3 DAYS   Final    Report Status 01/08/2012 FINAL   Final   ANAEROBIC CULTURE     Status: Normal   Collection Time   01/04/12  9:46 PM      Component Value Range Status Comment   Specimen Description ABSCESS BACK   Final    Special Requests PATIENT ON FOLLOWING LORTAZ, DIFLUCAN EPIDURAL   Final    Gram Stain     Final    Value: ABUNDANT WBC PRESENT,BOTH PMN AND MONONUCLEAR     NO ORGANISMS SEEN     Performed at [facility]   Culture NO ANAEROBES ISOLATED   Final    Report Status 01/09/2012 FINAL   Final   GRAM STAIN     Status: Normal   Collection Time   01/04/12  9:46 PM      Component Value Range Status Comment   Specimen Description ABSCESS BACK   Final    Special Requests PATIENT ON FOLLOWING LORTAZ, DIFLUCAN EPIDURAL   Final    Gram Stain     Final  Value: ABUNDANT WBC PRESENT,BOTH PMN AND MONONUCLEAR     NO ORGANISMS SEEN   Report Status 01/04/2012 FINAL   Final   CULTURE, ROUTINE-ABSCESS     Status: Normal (Preliminary result)   Collection Time   01/07/12  4:14 PM       Component Value Range Status Comment   Specimen Description ABSCESS   Final    Special Requests PSOAS MUSCLE   Final    Gram Stain     Final    Value: MODERATE WBC PRESENT, PREDOMINANTLY PMN     NO SQUAMOUS EPITHELIAL CELLS SEEN     NO ORGANISMS SEEN   Culture NO GROWTH 3 DAYS   Final    Report Status PENDING   Incomplete   CULTURE, ROUTINE-ABSCESS     Status: Normal   Collection Time   01/07/12  9:03 PM      Component Value Range Status Comment   Specimen Description ABSCESS EYE LEFT   Final    Special Requests INTRAOCULAR   Final    Gram Stain     Final    Value: ABUNDANT WBC PRESENT,BOTH PMN AND MONONUCLEAR     RARE GRAM POSITIVE COCCI IN PAIRS     Performed at [facility]   Culture NO GROWTH 3 DAYS   Final    Report Status 01/11/2012 FINAL   Final   ANAEROBIC CULTURE     Status: Normal (Preliminary result)   Collection Time   01/07/12  9:03 PM      Component Value Range Status Comment   Specimen Description ABSCESS EYE LEFT   Final    Special Requests INTRAOCULAR   Final    Gram Stain     Final    Value: ABUNDANT WBC PRESENT,BOTH PMN AND MONONUCLEAR     RARE GRAM POSITIVE COCCI IN PAIRS     Performed at [facility]   Culture     Final    Value: NO ANAEROBES ISOLATED; CULTURE IN PROGRESS FOR 5 DAYS   Report Status PENDING   Incomplete   GRAM STAIN     Status: Normal   Collection Time   01/07/12  9:03 PM      Component Value Range Status Comment   Specimen Description ABSCESS EYE LEFT   Final    Special Requests INTRAOCULAR   Final    Gram Stain     Final    Value: ABUNDANT WBC PRESENT,BOTH PMN AND MONONUCLEAR     RARE GRAM POSITIVE COCCI IN PAIRS   Report Status 01/07/2012 FINAL   Final     Studies/Results: No results found.  Medications: I have reviewed the patient's current medications. Scheduled Meds:    . artificial tears   Left Eye Q6H WA  . bacitracin-polymyxin b   Left Eye Q6H WA  . ceFAZolin  1 mL Subconjunctival Once  . ceFAZolin  200 mg  Subconjunctival Once  . cefTAZidime (FORTAZ)  IV  1 g Intravenous Q8H  . enoxaparin (LOVENOX) injection  40 mg Subcutaneous Q24H  . feeding supplement  237 mL Oral BID  . pantoprazole  40 mg Oral BID AC  . potassium chloride  40 mEq Oral Once  . sodium chloride  3 mL Intravenous Q12H  . vancomycin  750 mg Intravenous Q8H  . DISCONTD: fentaNYL  250 mcg Intravenous Once  . DISCONTD: midazolam  10 mg Intravenous Once  . DISCONTD: sodium chloride  3 mL Intravenous Q12H   Continuous Infusions:    .  DISCONTD: sodium chloride Stopped (01/10/12 1225)   PRN Meds:.acetaminophen, acetaminophen, alum & mag hydroxide-simeth, diphenhydrAMINE, HYDROcodone-acetaminophen, hydrocortisone cream, HYDROmorphone (DILAUDID) injection, ondansetron (ZOFRAN) IV, ondansetron, DISCONTD: sodium chloride, DISCONTD: benzocaine, DISCONTD: sodium chloride  Assessment/Plan: Interim note  85 female with chr low back pain and hx of hep B presented with 2 days hx of painless left eye blindness with hx of nausea. Vomiting and diarrhea 1 week back with findings of left anterior uveitis and multilobar left sided pneumonia on CT scan.  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  Patient had MRI of her cervical thoracic and LS spine done on 3/5 showing epidural abscess over L4-S2 with left psoas abscess s/p drain placement in  Spine and psoas abscess NGTD on any cultures- plan per ID TEE negatvie   PLAN:  Rash ?etiology fortaz held, will ask ID to address  Leukocytosis Improving on antibiotics Resolved 3/9  Endophthalmitis s/p removal and culture on 3/8- showing gram + cocci Seen by opthalmology consult Dr Randon Goldsmith.  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  abx coverage broadened with IV vanco and cefepime ( 3/3) . Also added fluconazole for  fungal coverage on 3/4 has since been D/C'd -Dr Randon Goldsmith performed vitrectomy on 3/4 with cx sent and will follow. 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   Hypokalemia repleat  Diarrhea  Prior to admission  c diff negative  resolved    DVT prophylaxis: lovenox SCD   Full code   Consult:  opthalmology :[place] ID : [place]  Pulmonary :[place]  Need to clarify abx and length of antibiotics with ID as nothing has grown on cultures thus far. D/C plan? - patient wants to go home- has lots of support- await PT to see how she progresses    LOS: 12 days  Othello Sgroi, DO 01/11/2012, 10:37 AM

## 2012-01-11 NOTE — Progress Notes (Signed)
Patient complained of itching under her right arm at change of shift, notified doctor that there was some redness under her arm and requested hydrocortisone cream.  Patient now has a red rash on her back above her dressing and both flank areas, redness to right breast, and patient is complaining of increased itching.  No complaints of breathing issues or airway or respiratory distress.  Will notify doctor and continue to monitor.  M.Sherrilee Gilles, RN

## 2012-01-11 NOTE — Progress Notes (Signed)
1 Day Post-Op  Subjective: Patient c/o itching, skin rash (?heat rash from perspiration per pt)- improved with benadryl; otherwise doing well  Objective: Vital signs in last 24 hours: Temp:  [98.2 F (36.8 C)-98.4 F (36.9 C)] 98.2 F (36.8 C) (03/12 0514) Pulse Rate:  [72-98] 72  (03/12 0514) Resp:  [15-28] 17  (03/12 0514) BP: (121-148)/(67-110) 138/67 mmHg (03/12 0514) SpO2:  [91 %-100 %] 97 % (03/12 0514) Weight:  [148 lb 2.4 oz (67.2 kg)] 148 lb 2.4 oz (67.2 kg) (03/12 0514) Last BM Date: 01/10/12  Intake/Output from previous day: 03/11 0701 - 03/12 0700 In: 780 [P.O.:480; IV Piggyback:300] Out: 2330 [Urine:2300; Drains:30] Intake/Output this shift: Total I/O In: 480 [P.O.:480] Out: -   Left psoas drain intact, output today 30 cc's , cx's pending  Lab Results:   [facility] 01/11/12 0540 01/10/12 0640  WBC 5.9 7.3  HGB 10.2* 9.6*  HCT 30.8* 29.6*  PLT 600* 624*   BMET  Basename 01/11/12 0540 01/10/12 0640  NA 137 137  K 3.8 3.2*  CL 101 101  CO2 30 31  GLUCOSE 83 91  BUN 4* <3*  CREATININE 0.55 0.57  CALCIUM 8.1* 7.7*   PT/INR No results found for this basename: LABPROT:2,INR:2 in the last 72 hours ABG No results found for this basename: PHART:2,PCO2:2,PO2:2,HCO3:2 in the last 72 hours  Studies/Results: No results found. Results for orders placed during the hospital encounter of 12/30/11  URINE CULTURE     Status: Normal   Collection Time   12/31/11  4:15 AM      Component Value Range Status Comment   Specimen Description URINE, CLEAN CATCH   Final    Special Requests NONE   Final    Culture  Setup Time [id number]   Final    Colony Count NO GROWTH   Final    Culture NO GROWTH   Final    Report Status 01/01/2012 FINAL   Final   CULTURE, BLOOD (ROUTINE X 2)     Status: Normal   Collection Time   12/31/11  8:42 AM      Component Value Range Status Comment   Specimen Description BLOOD LEFT ARM   Final    Special Requests BOTTLES DRAWN AEROBIC ONLY  10CC   Final    Culture  Setup Time [id number]   Final    Culture NO GROWTH 5 DAYS   Final    Report Status 01/06/2012 FINAL   Final   CULTURE, BLOOD (ROUTINE X 2)     Status: Normal   Collection Time   12/31/11  8:48 AM      Component Value Range Status Comment   Specimen Description BLOOD LEFT HAND   Final    Special Requests BOTTLES DRAWN AEROBIC ONLY 10CC   Final    Culture  Setup Time [id number]   Final    Culture NO GROWTH 5 DAYS   Final    Report Status 01/06/2012 FINAL   Final   CULTURE, SPUTUM-ASSESSMENT     Status: Normal   Collection Time   12/31/11  2:15 PM      Component Value Range Status Comment   Specimen Description SPUTUM   Final    Special Requests NONE   Final    Sputum evaluation     Final    Value: THIS SPECIMEN IS ACCEPTABLE. RESPIRATORY CULTURE REPORT TO FOLLOW.   Report Status 12/31/2011 FINAL   Final   CULTURE, RESPIRATORY     Status:  Normal   Collection Time   12/31/11  2:15 PM      Component Value Range Status Comment   Specimen Description SPUTUM   Final    Special Requests NONE   Final    Gram Stain     Final    Value: NO WBC SEEN     NO SQUAMOUS EPITHELIAL CELLS SEEN     FEW YEAST   Culture NORMAL OROPHARYNGEAL FLORA   Final    Report Status 01/03/2012 FINAL   Final   STOOL CULTURE     Status: Normal   Collection Time   12/31/11  2:45 PM      Component Value Range Status Comment   Specimen Description STOOL   Final    Special Requests NONE   Final    Culture     Final    Value: NO SALMONELLA, SHIGELLA, CAMPYLOBACTER, OR YERSINIA ISOLATED   Report Status 01/04/2012 FINAL   Final   CLOSTRIDIUM DIFFICILE BY PCR     Status: Normal   Collection Time   01/02/12 11:29 AM      Component Value Range Status Comment   C difficile by pcr NEGATIVE  NEGATIVE  Final   EYE CULTURE     Status: Normal (Preliminary result)   Collection Time   01/03/12  1:45 PM      Component Value Range Status Comment   Specimen Description EYE LEFT   Final    Special Requests      Final    Value: VTREOUS PT ON VANC CEFTAZ DO SENS TO ALL GROWTH INCLUDING COAG NEG STAPH PER MD   Culture NO GROWTH 6 DAYS   Final    Report Status PENDING   Incomplete   EYE CULTURE     Status: Normal (Preliminary result)   Collection Time   01/03/12  1:45 PM      Component Value Range Status Comment   Specimen Description EYE LEFT   Final    Special Requests     Final    Value: ANTEROR CHAMBER PT ON VANC CEFTAZ DO SENS TO ALL GROWTH INCLUDING COAG NEG STAPH PER MD   Culture NO GROWTH 6 DAYS   Final    Report Status PENDING   Incomplete   CULTURE, BLOOD (ROUTINE X 2)     Status: Normal   Collection Time   01/03/12  8:32 PM      Component Value Range Status Comment   Specimen Description BLOOD RIGHT HAND   Final    Special Requests BOTTLES DRAWN AEROBIC ONLY 10CC   Final    Culture  Setup Time [id number]   Final    Culture NO GROWTH 5 DAYS   Final    Report Status 01/10/2012 FINAL   Final   CULTURE, BLOOD (ROUTINE X 2)     Status: Normal   Collection Time   01/03/12  8:39 PM      Component Value Range Status Comment   Specimen Description BLOOD RIGHT FOREARM   Final    Special Requests BOTTLES DRAWN AEROBIC ONLY 10CC   Final    Culture  Setup Time [id number]   Final    Culture NO GROWTH 5 DAYS   Final    Report Status 01/10/2012 FINAL   Final   CULTURE, SPUTUM-ASSESSMENT     Status: Normal   Collection Time   01/04/12  6:48 AM      Component Value Range Status Comment   Specimen Description SPUTUM  Final    Special Requests NONE   Final    Sputum evaluation     Final    Value: MICROSCOPIC FINDINGS SUGGEST THAT THIS SPECIMEN IS NOT REPRESENTATIVE OF LOWER RESPIRATORY SECRETIONS. PLEASE RECOLLECT.     CALLED TO [facility] RN 01/04/12 0800 COSTELLO B   Report Status 01/04/2012 FINAL   Final   MRSA PCR SCREENING     Status: Normal   Collection Time   01/04/12  4:20 PM      Component Value Range Status Comment   MRSA by PCR NEGATIVE  NEGATIVE  Final   CULTURE, ROUTINE-ABSCESS      Status: Normal   Collection Time   01/04/12  9:46 PM      Component Value Range Status Comment   Specimen Description ABSCESS BACK   Final    Special Requests PATIENT ON FOLLOWING LORTAZ, DIFLUCAN EPIDURAL   Final    Gram Stain     Final    Value: ABUNDANT WBC PRESENT,BOTH PMN AND MONONUCLEAR     NO ORGANISMS SEEN   Culture NO GROWTH 3 DAYS   Final    Report Status 01/08/2012 FINAL   Final   ANAEROBIC CULTURE     Status: Normal   Collection Time   01/04/12  9:46 PM      Component Value Range Status Comment   Specimen Description ABSCESS BACK   Final    Special Requests PATIENT ON FOLLOWING LORTAZ, DIFLUCAN EPIDURAL   Final    Gram Stain     Final    Value: ABUNDANT WBC PRESENT,BOTH PMN AND MONONUCLEAR     NO ORGANISMS SEEN     Performed at [facility]   Culture NO ANAEROBES ISOLATED   Final    Report Status 01/09/2012 FINAL   Final   GRAM STAIN     Status: Normal   Collection Time   01/04/12  9:46 PM      Component Value Range Status Comment   Specimen Description ABSCESS BACK   Final    Special Requests PATIENT ON FOLLOWING LORTAZ, DIFLUCAN EPIDURAL   Final    Gram Stain     Final    Value: ABUNDANT WBC PRESENT,BOTH PMN AND MONONUCLEAR     NO ORGANISMS SEEN   Report Status 01/04/2012 FINAL   Final   CULTURE, ROUTINE-ABSCESS     Status: Normal (Preliminary result)   Collection Time   01/07/12  4:14 PM      Component Value Range Status Comment   Specimen Description ABSCESS   Final    Special Requests PSOAS MUSCLE   Final    Gram Stain     Final    Value: MODERATE WBC PRESENT, PREDOMINANTLY PMN     NO SQUAMOUS EPITHELIAL CELLS SEEN     NO ORGANISMS SEEN   Culture NO GROWTH 3 DAYS   Final    Report Status PENDING   Incomplete   CULTURE, ROUTINE-ABSCESS     Status: Normal   Collection Time   01/07/12  9:03 PM      Component Value Range Status Comment   Specimen Description ABSCESS EYE LEFT   Final    Special Requests INTRAOCULAR   Final    Gram Stain     Final    Value:  ABUNDANT WBC PRESENT,BOTH PMN AND MONONUCLEAR     RARE GRAM POSITIVE COCCI IN PAIRS     Performed at [facility]   Culture NO GROWTH 3 DAYS  Final    Report Status 01/11/2012 FINAL   Final   ANAEROBIC CULTURE     Status: Normal (Preliminary result)   Collection Time   01/07/12  9:03 PM      Component Value Range Status Comment   Specimen Description ABSCESS EYE LEFT   Final    Special Requests INTRAOCULAR   Final    Gram Stain     Final    Value: ABUNDANT WBC PRESENT,BOTH PMN AND MONONUCLEAR     RARE GRAM POSITIVE COCCI IN PAIRS     Performed at [facility]   Culture     Final    Value: NO ANAEROBES ISOLATED; CULTURE IN PROGRESS FOR 5 DAYS   Report Status PENDING   Incomplete   GRAM STAIN     Status: Normal   Collection Time   01/07/12  9:03 PM      Component Value Range Status Comment   Specimen Description ABSCESS EYE LEFT   Final    Special Requests INTRAOCULAR   Final    Gram Stain     Final    Value: ABUNDANT WBC PRESENT,BOTH PMN AND MONONUCLEAR     RARE GRAM POSITIVE COCCI IN PAIRS   Report Status 01/07/2012 FINAL   Final     Anti-infectives: Anti-infectives     Start     Dose/Rate Route Frequency Ordered Stop   01/07/12 2137   ceFAZolin (ANCEF) injection  Status:  Discontinued          As needed 01/07/12 2138 01/07/12 2217   01/07/12 1930   ceFAZolin (ANCEF) 200 mg/mL injection 200 mg        1 mL Subconjunctival  Once 01/07/12 1924     01/07/12 1930   ceFAZolin (ANCEF) 200 mg/mL injection 200 mg        200 mg Subconjunctival  Once 01/07/12 1926     01/06/12 0200   vancomycin (VANCOCIN) 750 mg in sodium chloride 0.9 % 150 mL IVPB        750 mg 150 mL/hr over 60 Minutes Intravenous Every 8 hours 01/05/12 1952     01/05/12 1300   cefTAZidime (FORTAZ) ophth injection 100 mg        100 mg Subconjunctival  Once 01/05/12 1247 01/05/12 1443   01/05/12 1300   vancomycin (VANCOCIN) ophth injection 25 mg        25 mg Subconjunctival  Once 01/05/12 1247  01/05/12 1446   01/05/12 1300   vancomycin (VANCOCIN) injection INJ 1 mg        1 mg Subconjunctival  Once 01/05/12 1247 01/05/12 1446   01/05/12 1200   cefTAZidime (FORTAZ) ophth injection 2.25 mg        2.25 mg Intravitreal  Once 01/05/12 1053 01/05/12 1444   01/04/12 2134   50,000 units bacitracin in 0.9% normal saline 250 mL irrigation  Status:  Discontinued          As needed 01/04/12 2145 01/04/12 2247   01/04/12 2100   bacitracin 40981 UNITS injection     Comments: AURAND, BRANDI: cabinet override         01/04/12 2100 01/05/12 0859   01/04/12 2100   ceFAZolin (ANCEF) 1-5 GM-% IVPB     Comments: AURAND, BRANDI: cabinet override         01/04/12 2100 01/05/12 0859   01/04/12 1830   fluconazole (DIFLUCAN) IVPB 400 mg  Status:  Discontinued        400 mg 200  mL/hr over 60 Minutes Intravenous Every 24 hours 01/03/12 1827 01/07/12 1616   01/03/12 1930   fluconazole (DIFLUCAN) IVPB 800 mg        800 mg 400 mL/hr over 60 Minutes Intravenous  Once 01/03/12 1826 01/03/12 2139   01/03/12 1245   cefTAZidime (FORTAZ) ophth injection 2.25 mg  Status:  Discontinued        2.25 mg Intravitreal  Once 01/03/12 1135 01/09/12 0427   01/03/12 1245   cefTAZidime (FORTAZ) ophth injection 100 mg  Status:  Discontinued        100 mg Subconjunctival  Once 01/03/12 1135 01/09/12 0427   01/03/12 1200   vancomycin (VANCOCIN) ophth injection 0.1 mg  Status:  Discontinued        0.1 mg Intravitreal  Once 01/03/12 1130 01/09/12 0427   01/03/12 1200   vancomycin (VANCOCIN) ophth injection 25 mg  Status:  Discontinued        25 mg Subconjunctival  Once 01/03/12 1131 01/09/12 0427   01/03/12 0500   vancomycin (VANCOCIN) 750 mg in sodium chloride 0.9 % 150 mL IVPB  Status:  Discontinued        750 mg 150 mL/hr over 60 Minutes Intravenous Every 12 hours 01/02/12 1716 01/05/12 1953   01/03/12 0200   ceFEPIme (MAXIPIME) 2 g in dextrose 5 % 50 mL IVPB  Status:  Discontinued        2 g 100 mL/hr over 30  Minutes Intravenous Every 8 hours 01/02/12 1716 01/02/12 2041   01/02/12 2200   cefTAZidime (FORTAZ) 1 g in dextrose 5 % 50 mL IVPB        1 g 100 mL/hr over 30 Minutes Intravenous 3 times per day 01/02/12 2106     01/02/12 1400   ceFEPIme (MAXIPIME) 2 g in dextrose 5 % 50 mL IVPB  Status:  Discontinued        2 g 100 mL/hr over 30 Minutes Intravenous 3 times per day 01/02/12 1318 01/02/12 1716   01/02/12 1400   vancomycin (VANCOCIN) 750 mg in sodium chloride 0.9 % 150 mL IVPB  Status:  Discontinued        750 mg 150 mL/hr over 60 Minutes Intravenous Every 12 hours 01/02/12 1350 01/02/12 1716   12/31/11 1530   valACYclovir (VALTREX) tablet 2,000 mg        2,000 mg Oral 2 times daily 12/31/11 1358 12/31/11 2235   12/31/11 0100   moxifloxacin (AVELOX) IVPB 400 mg  Status:  Discontinued        400 mg 250 mL/hr over 60 Minutes Intravenous Every 24 hours 12/31/11 0040 01/03/12 1913          Assessment/Plan: s/p left psoas abscess drainage 3/8; cont current tx, check final cx's, check f/u CT near end of week to assess adequacy of drainage    LOS: 12 days    Lawrencia Mauney,D [facility] 01/11/2012

## 2012-01-11 NOTE — Progress Notes (Signed)
INFECTIOUS DISEASE PROGRESS NOTE  ID: Donna Tapia is a 61 y.o. female with pan endophthalmitis.  Active Problems:  Dehydration  Renal failure, acute  Uveitis, anterior  Nausea & vomiting  Lobar pneumonia due to unspecified organism  Subjective: Without complaints, does have a rash  Abtx:  Anti-infectives     Start     Dose/Rate Route Frequency Ordered Stop   01/07/12 2137   ceFAZolin (ANCEF) injection  Status:  Discontinued          As needed 01/07/12 2138 01/07/12 2217   01/07/12 1930   ceFAZolin (ANCEF) 200 mg/mL injection 200 mg        1 mL Subconjunctival  Once 01/07/12 1924     01/07/12 1930   ceFAZolin (ANCEF) 200 mg/mL injection 200 mg        200 mg Subconjunctival  Once 01/07/12 1926     01/06/12 0200   vancomycin (VANCOCIN) 750 mg in sodium chloride 0.9 % 150 mL IVPB        750 mg 150 mL/hr over 60 Minutes Intravenous Every 8 hours 01/05/12 1952     01/05/12 1300   cefTAZidime (FORTAZ) ophth injection 100 mg        100 mg Subconjunctival  Once 01/05/12 1247 01/05/12 1443   01/05/12 1300   vancomycin (VANCOCIN) ophth injection 25 mg        25 mg Subconjunctival  Once 01/05/12 1247 01/05/12 1446   01/05/12 1300   vancomycin (VANCOCIN) injection INJ 1 mg        1 mg Subconjunctival  Once 01/05/12 1247 01/05/12 1446   01/05/12 1200   cefTAZidime (FORTAZ) ophth injection 2.25 mg        2.25 mg Intravitreal  Once 01/05/12 1053 01/05/12 1444   01/04/12 2134   50,000 units bacitracin in 0.9% normal saline 250 mL irrigation  Status:  Discontinued          As needed 01/04/12 2145 01/04/12 2247   01/04/12 2100   bacitracin 40981 UNITS injection     Comments: AURAND, BRANDI: cabinet override         01/04/12 2100 01/05/12 0859   01/04/12 2100   ceFAZolin (ANCEF) 1-5 GM-% IVPB     Comments: AURAND, BRANDI: cabinet override         01/04/12 2100 01/05/12 0859   01/04/12 1830   fluconazole (DIFLUCAN) IVPB 400 mg  Status:  Discontinued        400 mg 200 mL/hr over 60  Minutes Intravenous Every 24 hours 01/03/12 1827 01/07/12 1616   01/03/12 1930   fluconazole (DIFLUCAN) IVPB 800 mg        800 mg 400 mL/hr over 60 Minutes Intravenous  Once 01/03/12 1826 01/03/12 2139   01/03/12 1245   cefTAZidime (FORTAZ) ophth injection 2.25 mg  Status:  Discontinued        2.25 mg Intravitreal  Once 01/03/12 1135 01/09/12 0427   01/03/12 1245   cefTAZidime (FORTAZ) ophth injection 100 mg  Status:  Discontinued        100 mg Subconjunctival  Once 01/03/12 1135 01/09/12 0427   01/03/12 1200   vancomycin (VANCOCIN) ophth injection 0.1 mg  Status:  Discontinued        0.1 mg Intravitreal  Once 01/03/12 1130 01/09/12 0427   01/03/12 1200   vancomycin (VANCOCIN) ophth injection 25 mg  Status:  Discontinued        25 mg Subconjunctival  Once 01/03/12 1131 01/09/12 0427  01/03/12 0500   vancomycin (VANCOCIN) 750 mg in sodium chloride 0.9 % 150 mL IVPB  Status:  Discontinued        750 mg 150 mL/hr over 60 Minutes Intravenous Every 12 hours 01/02/12 1716 01/05/12 1953   01/03/12 0200   ceFEPIme (MAXIPIME) 2 g in dextrose 5 % 50 mL IVPB  Status:  Discontinued        2 g 100 mL/hr over 30 Minutes Intravenous Every 8 hours 01/02/12 1716 01/02/12 2041   01/02/12 2200   cefTAZidime (FORTAZ) 1 g in dextrose 5 % 50 mL IVPB        1 g 100 mL/hr over 30 Minutes Intravenous 3 times per day 01/02/12 2106     01/02/12 1400   ceFEPIme (MAXIPIME) 2 g in dextrose 5 % 50 mL IVPB  Status:  Discontinued        2 g 100 mL/hr over 30 Minutes Intravenous 3 times per day 01/02/12 1318 01/02/12 1716   01/02/12 1400   vancomycin (VANCOCIN) 750 mg in sodium chloride 0.9 % 150 mL IVPB  Status:  Discontinued        750 mg 150 mL/hr over 60 Minutes Intravenous Every 12 hours 01/02/12 1350 01/02/12 1716   12/31/11 1530   valACYclovir (VALTREX) tablet 2,000 mg        2,000 mg Oral 2 times daily 12/31/11 1358 12/31/11 2235   12/31/11 0100   moxifloxacin (AVELOX) IVPB 400 mg  Status:   Discontinued        400 mg 250 mL/hr over 60 Minutes Intravenous Every 24 hours 12/31/11 0040 01/03/12 1913          Medications:  Scheduled:    . artificial tears   Left Eye Q6H WA  . bacitracin-polymyxin b   Left Eye Q6H WA  . ceFAZolin  1 mL Subconjunctival Once  . ceFAZolin  200 mg Subconjunctival Once  . cefTAZidime (FORTAZ)  IV  1 g Intravenous Q8H  . enoxaparin (LOVENOX) injection  40 mg Subcutaneous Q24H  . feeding supplement  237 mL Oral BID  . pantoprazole  40 mg Oral BID AC  . sodium chloride  3 mL Intravenous Q12H  . vancomycin  750 mg Intravenous Q8H    Objective: Vital signs in last 24 hours: Temp:  [98 F (36.7 C)-98.4 F (36.9 C)] 98 F (36.7 C) (03/12 1500) Pulse Rate:  [72-88] 88  (03/12 1500) Resp:  [17-18] 18  (03/12 1500) BP: (133-148)/(67-76) 133/75 mmHg (03/12 1500) SpO2:  [94 %-98 %] 98 % (03/12 1500) Weight:  [148 lb 2.4 oz (67.2 kg)] 148 lb 2.4 oz (67.2 kg) (03/12 0514)   Gen - awake, eye patch on. CV - rrr Skin - erythematous, micropapular, macular rash, non- confluent  Lab Results  [facility] 01/11/12 0540 01/10/12 0640  WBC 5.9 7.3  HGB 10.2* 9.6*  HCT 30.8* 29.6*  NA 137 137  K 3.8 3.2*  CL 101 101  CO2 30 31  BUN 4* <3*  CREATININE 0.55 0.57  GLU -- --   Liver Panel No results found for this basename: PROT:2,ALBUMIN:2,AST:2,ALT:2,ALKPHOS:2,BILITOT:2,BILIDIR:2,IBILI:2 in the last 72 hours Sedimentation Rate No results found for this basename: ESRSEDRATE in the last 72 hours C-Reactive Protein No results found for this basename: CRP:2 in the last 72 hours  Microbiology: Recent Results (from the past 240 hour(s))  CLOSTRIDIUM DIFFICILE BY PCR     Status: Normal   Collection Time   01/02/12 11:29 AM  Component Value Range Status Comment   C difficile by pcr NEGATIVE  NEGATIVE  Final   EYE CULTURE     Status: Normal   Collection Time   01/03/12  1:45 PM      Component Value Range Status Comment   Specimen Description  EYE LEFT   Final    Special Requests     Final    Value: VTREOUS PT ON VANC CEFTAZ DO SENS TO ALL GROWTH INCLUDING COAG NEG STAPH PER MD   Culture NO GROWTH 7 DAYS   Final    Report Status 01/11/2012 FINAL   Final   EYE CULTURE     Status: Normal   Collection Time   01/03/12  1:45 PM      Component Value Range Status Comment   Specimen Description EYE LEFT   Final    Special Requests     Final    Value: ANTEROR CHAMBER PT ON VANC CEFTAZ DO SENS TO ALL GROWTH INCLUDING COAG NEG STAPH PER MD   Culture NO GROWTH 7 DAYS   Final    Report Status 01/11/2012 FINAL   Final   CULTURE, BLOOD (ROUTINE X 2)     Status: Normal   Collection Time   01/03/12  8:32 PM      Component Value Range Status Comment   Specimen Description BLOOD RIGHT HAND   Final    Special Requests BOTTLES DRAWN AEROBIC ONLY 10CC   Final    Culture  Setup Time [id number]   Final    Culture NO GROWTH 5 DAYS   Final    Report Status 01/10/2012 FINAL   Final   CULTURE, BLOOD (ROUTINE X 2)     Status: Normal   Collection Time   01/03/12  8:39 PM      Component Value Range Status Comment   Specimen Description BLOOD RIGHT FOREARM   Final    Special Requests BOTTLES DRAWN AEROBIC ONLY 10CC   Final    Culture  Setup Time [id number]   Final    Culture NO GROWTH 5 DAYS   Final    Report Status 01/10/2012 FINAL   Final   CULTURE, SPUTUM-ASSESSMENT     Status: Normal   Collection Time   01/04/12  6:48 AM      Component Value Range Status Comment   Specimen Description SPUTUM   Final    Special Requests NONE   Final    Sputum evaluation     Final    Value: MICROSCOPIC FINDINGS SUGGEST THAT THIS SPECIMEN IS NOT REPRESENTATIVE OF LOWER RESPIRATORY SECRETIONS. PLEASE RECOLLECT.     CALLED TO [facility] RN 01/04/12 0800 COSTELLO B   Report Status 01/04/2012 FINAL   Final   MRSA PCR SCREENING     Status: Normal   Collection Time   01/04/12  4:20 PM      Component Value Range Status Comment   MRSA by PCR NEGATIVE  NEGATIVE  Final     CULTURE, ROUTINE-ABSCESS     Status: Normal   Collection Time   01/04/12  9:46 PM      Component Value Range Status Comment   Specimen Description ABSCESS BACK   Final    Special Requests PATIENT ON FOLLOWING LORTAZ, DIFLUCAN EPIDURAL   Final    Gram Stain     Final    Value: ABUNDANT WBC PRESENT,BOTH PMN AND MONONUCLEAR     NO ORGANISMS SEEN   Culture NO GROWTH 3 DAYS  Final    Report Status 01/08/2012 FINAL   Final   ANAEROBIC CULTURE     Status: Normal   Collection Time   01/04/12  9:46 PM      Component Value Range Status Comment   Specimen Description ABSCESS BACK   Final    Special Requests PATIENT ON FOLLOWING LORTAZ, DIFLUCAN EPIDURAL   Final    Gram Stain     Final    Value: ABUNDANT WBC PRESENT,BOTH PMN AND MONONUCLEAR     NO ORGANISMS SEEN     Performed at [facility]   Culture NO ANAEROBES ISOLATED   Final    Report Status 01/09/2012 FINAL   Final   GRAM STAIN     Status: Normal   Collection Time   01/04/12  9:46 PM      Component Value Range Status Comment   Specimen Description ABSCESS BACK   Final    Special Requests PATIENT ON FOLLOWING LORTAZ, DIFLUCAN EPIDURAL   Final    Gram Stain     Final    Value: ABUNDANT WBC PRESENT,BOTH PMN AND MONONUCLEAR     NO ORGANISMS SEEN   Report Status 01/04/2012 FINAL   Final   CULTURE, ROUTINE-ABSCESS     Status: Normal   Collection Time   01/07/12  4:14 PM      Component Value Range Status Comment   Specimen Description ABSCESS   Final    Special Requests PSOAS MUSCLE   Final    Gram Stain     Final    Value: MODERATE WBC PRESENT, PREDOMINANTLY PMN     NO SQUAMOUS EPITHELIAL CELLS SEEN     NO ORGANISMS SEEN   Culture NO GROWTH 3 DAYS   Final    Report Status 01/11/2012 FINAL   Final   CULTURE, ROUTINE-ABSCESS     Status: Normal   Collection Time   01/07/12  9:03 PM      Component Value Range Status Comment   Specimen Description ABSCESS EYE LEFT   Final    Special Requests INTRAOCULAR   Final    Gram Stain      Final    Value: ABUNDANT WBC PRESENT,BOTH PMN AND MONONUCLEAR     RARE GRAM POSITIVE COCCI IN PAIRS     Performed at [facility]   Culture NO GROWTH 3 DAYS   Final    Report Status 01/11/2012 FINAL   Final   ANAEROBIC CULTURE     Status: Normal (Preliminary result)   Collection Time   01/07/12  9:03 PM      Component Value Range Status Comment   Specimen Description ABSCESS EYE LEFT   Final    Special Requests INTRAOCULAR   Final    Gram Stain     Final    Value: ABUNDANT WBC PRESENT,BOTH PMN AND MONONUCLEAR     RARE GRAM POSITIVE COCCI IN PAIRS     Performed at [facility]   Culture     Final    Value: NO ANAEROBES ISOLATED; CULTURE IN PROGRESS FOR 5 DAYS   Report Status PENDING   Incomplete   GRAM STAIN     Status: Normal   Collection Time   01/07/12  9:03 PM      Component Value Range Status Comment   Specimen Description ABSCESS EYE LEFT   Final    Special Requests INTRAOCULAR   Final    Gram Stain     Final  Value: ABUNDANT WBC PRESENT,BOTH PMN AND MONONUCLEAR     RARE GRAM POSITIVE COCCI IN PAIRS   Report Status 01/07/2012 FINAL   Final     Studies/Results: No results found.   Assessment/Plan: 1) endophthalmitis - continue with antibiotics.  The rash does not appear to be drug induced but if it spreads, will need to reevaluate.  Continue with the vancomycin and ceftazidime.      Raahil Ong Infectious Diseases

## 2012-01-11 NOTE — Progress Notes (Signed)
Nutrition Follow-up  Diet Order:  Heart, 1800 ml fluid restriction, 2 gm sodium, Ensure Clinical Strength BID. PO intake mostly 100%. 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  New rash, possible heat rash, improved with benadryl. Patient c/o having taste changes " everything tastes like cardboard" but states that she is making herself eat.    Meds: Scheduled Meds:   . artificial tears   Left Eye Q6H WA  . bacitracin-polymyxin b   Left Eye Q6H WA  . ceFAZolin  1 mL Subconjunctival Once  . ceFAZolin  200 mg Subconjunctival Once  . cefTAZidime (FORTAZ)  IV  1 g Intravenous Q8H  . enoxaparin (LOVENOX) injection  40 mg Subcutaneous Q24H  . feeding supplement  237 mL Oral BID  . pantoprazole  40 mg Oral BID AC  . potassium chloride  40 mEq Oral Once  . sodium chloride  3 mL Intravenous Q12H  . vancomycin  750 mg Intravenous Q8H  . DISCONTD: fentaNYL  250 mcg Intravenous Once  . DISCONTD: midazolam  10 mg Intravenous Once  . DISCONTD: sodium chloride  3 mL Intravenous Q12H   Continuous Infusions:   . DISCONTD: sodium chloride Stopped (01/10/12 1225)   PRN Meds:.acetaminophen, acetaminophen, alum & mag hydroxide-simeth, diphenhydrAMINE, HYDROcodone-acetaminophen, hydrocortisone cream, HYDROmorphone (DILAUDID) injection, ondansetron (ZOFRAN) IV, ondansetron, DISCONTD: sodium chloride, DISCONTD: benzocaine, DISCONTD: sodium chloride  Labs:  CMP     Component Value Date/Time   NA 137 01/11/2012 0540   K 3.8 01/11/2012 0540   CL 101 01/11/2012 0540   CO2 30 01/11/2012 0540   GLUCOSE 83 01/11/2012 0540   BUN 4* 01/11/2012 0540   CREATININE 0.55 01/11/2012 0540   CALCIUM 8.1* 01/11/2012 0540   PROT 5.5* 12/31/2011 1155   ALBUMIN 1.5* 12/31/2011 1155   AST 50* 12/31/2011 1155   ALT 35 12/31/2011 1155   ALKPHOS 341* 12/31/2011 1155   BILITOT 1.2 12/31/2011 1155   GFRNONAA >90 01/11/2012 0540   GFRAA >90 01/11/2012 0540     Intake/Output Summary (Last 24 hours) at 01/11/12 1040 Last  data filed at 01/11/12 1191  Gross per 24 hour  Intake   1260 ml  Output   2030 ml  Net   -770 ml    Weight Status:  148 lbs, continues to trend up.   Re-estimated needs:  1500-1750 kcal, 60-70 gm protein  Nutrition Dx:  Inadequate oral intake resolved  Goal:  PO intake will remain mostly >75% of meals and supplements  Intervention:  No new nutrition interventions at this time  Monitor:  PO intake, weights, labs, I/O's   [name] Pager #:  [phone]

## 2012-01-12 LAB — CBC
MCH: 30.8 pg (ref 26.0–34.0)
MCV: 94 fL (ref 78.0–100.0)
Platelets: 630 10*3/uL — ABNORMAL HIGH (ref 150–400)
RDW: 15.2 % (ref 11.5–15.5)

## 2012-01-12 LAB — BASIC METABOLIC PANEL
CO2: 28 mEq/L (ref 19–32)
Calcium: 8.2 mg/dL — ABNORMAL LOW (ref 8.4–10.5)
Creatinine, Ser: 0.62 mg/dL (ref 0.50–1.10)
GFR calc Af Amer: >90 mL/min (ref >90)

## 2012-01-12 LAB — ANAEROBIC CULTURE

## 2012-01-12 MED ORDER — POLYETHYLENE GLYCOL 3350 17 G PO PACK
17.0000 g | PACK | Freq: Every day | ORAL | Status: DC
  Administered 2012-01-12 – 2012-01-18 (×3): 17 g via ORAL
  Filled 2012-01-12 (×8): qty 1

## 2012-01-12 NOTE — Progress Notes (Signed)
Subjective: Rash essentially unchanged per patient  Objective: Vital signs in last 24 hours: Filed Vitals:   01/11/12 0514 01/11/12 1500 01/11/12 2125 01/12/12 0625  BP: 138/67 133/75 142/76 129/65  Pulse: 72 88 83 85  Temp: 98.2 F (36.8 C) 98 F (36.7 C) 98.4 F (36.9 C) 98.5 F (36.9 C)  TempSrc: Oral Oral Oral Oral  Resp: 17 18 18 17   Height:      Weight: 67.2 kg (148 lb 2.4 oz)   63.1 kg (139 lb 1.8 oz)  SpO2: 97% 98% 98% 97%   Weight change: -4.1 kg (-9 lb 0.6 oz)  Intake/Output Summary (Last 24 hours) at 01/12/12 1324 Last data filed at 01/12/12 0900  Gross per 24 hour  Intake    832 ml  Output   3488 ml  Net  -2656 ml    Physical Exam: General: Awake, Oriented, comforatbale HEENT: L eye removed , covered Neck: Supple CV: S1 and S2, rrr Lungs: clear ant Abdomen: Soft, Nontender, Nondistended, +bowel sounds. Ext: Good pulses. No edema., weakness B/L Neuro:Non Focal  Lab Results:  Basename 01/12/12 0500 01/11/12 0540  NA 138 137  K 3.6 3.8  CL 102 101  CO2 28 30  GLUCOSE 81 83  BUN 4* 4*  CREATININE 0.62 0.55  CALCIUM 8.2* 8.1*  MG -- --  PHOS -- --   No results found for this basename: AST:2,ALT:2,ALKPHOS:2,BILITOT:2,PROT:2,ALBUMIN:2 in the last 72 hours No results found for this basename: LIPASE:2,AMYLASE:2 in the last 72 hours  Basename 01/12/12 0500 01/11/12 0540  WBC 5.7 5.9  NEUTROABS -- --  HGB 10.8* 10.2*  HCT 33.0* 30.8*  MCV 94.0 93.6  PLT 630* 600*   No results found for this basename: CKTOTAL:3,CKMB:3,CKMBINDEX:3,TROPONINI:3 in the last 72 hours No components found with this basename: POCBNP:3 No results found for this basename: DDIMER:2 in the last 72 hours No results found for this basename: HGBA1C:2 in the last 72 hours No results found for this basename: CHOL:2,HDL:2,LDLCALC:2,TRIG:2,CHOLHDL:2,LDLDIRECT:2 in the last 72 hours No results found for this basename: TSH,T4TOTAL,FREET3,T3FREE,THYROIDAB in the last 72 hours No  results found for this basename: VITAMINB12:2,FOLATE:2,FERRITIN:2,TIBC:2,IRON:2,RETICCTPCT:2 in the last 72 hours  Micro Results: Recent Results (from the past 240 hour(s))  EYE CULTURE     Status: Normal   Collection Time   01/03/12  1:45 PM      Component Value Range Status Comment   Specimen Description EYE LEFT   Final    Special Requests     Final    Value: VTREOUS PT ON VANC CEFTAZ DO SENS TO ALL GROWTH INCLUDING COAG NEG STAPH PER MD   Culture NO GROWTH 7 DAYS   Final    Report Status 01/11/2012 FINAL   Final   EYE CULTURE     Status: Normal   Collection Time   01/03/12  1:45 PM      Component Value Range Status Comment   Specimen Description EYE LEFT   Final    Special Requests     Final    Value: ANTEROR CHAMBER PT ON VANC CEFTAZ DO SENS TO ALL GROWTH INCLUDING COAG NEG STAPH PER MD   Culture NO GROWTH 7 DAYS   Final    Report Status 01/11/2012 FINAL   Final   CULTURE, BLOOD (ROUTINE X 2)     Status: Normal   Collection Time   01/03/12  8:32 PM      Component Value Range Status Comment   Specimen Description BLOOD RIGHT HAND  Final    Special Requests BOTTLES DRAWN AEROBIC ONLY 10CC   Final    Culture  Setup Time [id number]   Final    Culture NO GROWTH 5 DAYS   Final    Report Status 01/10/2012 FINAL   Final   CULTURE, BLOOD (ROUTINE X 2)     Status: Normal   Collection Time   01/03/12  8:39 PM      Component Value Range Status Comment   Specimen Description BLOOD RIGHT FOREARM   Final    Special Requests BOTTLES DRAWN AEROBIC ONLY 10CC   Final    Culture  Setup Time [id number]   Final    Culture NO GROWTH 5 DAYS   Final    Report Status 01/10/2012 FINAL   Final   CULTURE, SPUTUM-ASSESSMENT     Status: Normal   Collection Time   01/04/12  6:48 AM      Component Value Range Status Comment   Specimen Description SPUTUM   Final    Special Requests NONE   Final    Sputum evaluation     Final    Value: MICROSCOPIC FINDINGS SUGGEST THAT THIS SPECIMEN IS NOT REPRESENTATIVE  OF LOWER RESPIRATORY SECRETIONS. PLEASE RECOLLECT.     CALLED TO [facility] RN 01/04/12 0800 COSTELLO B   Report Status 01/04/2012 FINAL   Final   MRSA PCR SCREENING     Status: Normal   Collection Time   01/04/12  4:20 PM      Component Value Range Status Comment   MRSA by PCR NEGATIVE  NEGATIVE  Final   CULTURE, ROUTINE-ABSCESS     Status: Normal   Collection Time   01/04/12  9:46 PM      Component Value Range Status Comment   Specimen Description ABSCESS BACK   Final    Special Requests PATIENT ON FOLLOWING LORTAZ, DIFLUCAN EPIDURAL   Final    Gram Stain     Final    Value: ABUNDANT WBC PRESENT,BOTH PMN AND MONONUCLEAR     NO ORGANISMS SEEN   Culture NO GROWTH 3 DAYS   Final    Report Status 01/08/2012 FINAL   Final   ANAEROBIC CULTURE     Status: Normal   Collection Time   01/04/12  9:46 PM      Component Value Range Status Comment   Specimen Description ABSCESS BACK   Final    Special Requests PATIENT ON FOLLOWING LORTAZ, DIFLUCAN EPIDURAL   Final    Gram Stain     Final    Value: ABUNDANT WBC PRESENT,BOTH PMN AND MONONUCLEAR     NO ORGANISMS SEEN     Performed at [facility]   Culture NO ANAEROBES ISOLATED   Final    Report Status 01/09/2012 FINAL   Final   GRAM STAIN     Status: Normal   Collection Time   01/04/12  9:46 PM      Component Value Range Status Comment   Specimen Description ABSCESS BACK   Final    Special Requests PATIENT ON FOLLOWING LORTAZ, DIFLUCAN EPIDURAL   Final    Gram Stain     Final    Value: ABUNDANT WBC PRESENT,BOTH PMN AND MONONUCLEAR     NO ORGANISMS SEEN   Report Status 01/04/2012 FINAL   Final   CULTURE, ROUTINE-ABSCESS     Status: Normal   Collection Time   01/07/12  4:14 PM      Component Value Range Status Comment  Specimen Description ABSCESS   Final    Special Requests PSOAS MUSCLE   Final    Gram Stain     Final    Value: MODERATE WBC PRESENT, PREDOMINANTLY PMN     NO SQUAMOUS EPITHELIAL CELLS SEEN     NO ORGANISMS SEEN   Culture NO  GROWTH 3 DAYS   Final    Report Status 01/11/2012 FINAL   Final   CULTURE, ROUTINE-ABSCESS     Status: Normal   Collection Time   01/07/12  9:03 PM      Component Value Range Status Comment   Specimen Description ABSCESS EYE LEFT   Final    Special Requests INTRAOCULAR   Final    Gram Stain     Final    Value: ABUNDANT WBC PRESENT,BOTH PMN AND MONONUCLEAR     RARE GRAM POSITIVE COCCI IN PAIRS     Performed at [facility]   Culture NO GROWTH 3 DAYS   Final    Report Status 01/11/2012 FINAL   Final   ANAEROBIC CULTURE     Status: Normal   Collection Time   01/07/12  9:03 PM      Component Value Range Status Comment   Specimen Description ABSCESS EYE LEFT   Final    Special Requests INTRAOCULAR   Final    Gram Stain     Final    Value: ABUNDANT WBC PRESENT,BOTH PMN AND MONONUCLEAR     RARE GRAM POSITIVE COCCI IN PAIRS     Performed at [facility]   Culture NO ANAEROBES ISOLATED   Final    Report Status 01/12/2012 FINAL   Final   GRAM STAIN     Status: Normal   Collection Time   01/07/12  9:03 PM      Component Value Range Status Comment   Specimen Description ABSCESS EYE LEFT   Final    Special Requests INTRAOCULAR   Final    Gram Stain     Final    Value: ABUNDANT WBC PRESENT,BOTH PMN AND MONONUCLEAR     RARE GRAM POSITIVE COCCI IN PAIRS   Report Status 01/07/2012 FINAL   Final     Studies/Results: No results found.  Medications: I have reviewed the patient's current medications. Scheduled Meds:    . artificial tears   Left Eye Q6H WA  . bacitracin-polymyxin b   Left Eye Q6H WA  . ceFAZolin  1 mL Subconjunctival Once  . ceFAZolin  200 mg Subconjunctival Once  . cefTAZidime (FORTAZ)  IV  1 g Intravenous Q8H  . enoxaparin (LOVENOX) injection  40 mg Subcutaneous Q24H  . feeding supplement  237 mL Oral BID  . pantoprazole  40 mg Oral BID AC  . sodium chloride  3 mL Intravenous Q12H  . vancomycin  750 mg Intravenous Q8H   Continuous Infusions:   PRN  Meds:.acetaminophen, acetaminophen, alum & mag hydroxide-simeth, diphenhydrAMINE, HYDROcodone-acetaminophen, hydrocortisone cream, HYDROmorphone (DILAUDID) injection, ondansetron (ZOFRAN) IV, ondansetron  Assessment/Plan: Interim note  37 female with chr low back pain and hx of hep B presented with 2 days hx of painless left eye blindness with hx of nausea. Vomiting and diarrhea 1 week back with findings of left anterior uveitis and multilobar left sided pneumonia on CT scan.  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  abx coverage broadened with IV vanco and cefepime ( 3/3) . Also added fluconazole for fungal coverage on 3/4 has since been D/C'd -Dr Randon Goldsmith performed vitrectomy on 3/4 with cx sent and will follow . 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  evident on MRI from 3/5 and likely the source of infection  Drain placed by IR- fell out on 3/7  drain placed in L psoas muscle on 3/8, IR continues to follow Cont empiric abx  Plan is to rescan later this week  Left sided multilobar PNA  Secondary to infection- on abx  Anemia  Iron deficiency noted on  iron panel  b12 and folate wnl  Stool for occult blood negative  Will monitor closely, hold off on trasnfusion For now until acute issues resolve  GI eval as out patient once acute issue resolved   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   Hypokalemia repleat  Diarrhea  Prior to admission  c diff negative  resolved    DVT prophylaxis: lovenox SCD   Full code   Disposition -likely [facility]/RN-will need psoas drain to be d/c'd prior to discharge  Consult:  opthalmology :[place] ID : [place]  Pulmonary :[place]   LOS: 13 days  Lashaun Krapf, DO 01/12/2012, 1:24 PM

## 2012-01-12 NOTE — Progress Notes (Signed)
Physical Therapy Treatment Patient Details Name: Donna Tapia MRN: [id number] DOB: [birth year] Today's Date: 01/12/2012  PT Assessment/Plan  PT - Assessment/Plan Comments on Treatment Session: pt doing well attending to left side with mobility, to the point now that it is significantly slowing her mobility but this will imrpve with practice.  Pt's strength is improving but she still fatigues quickly.  Continue with PT and same d/c plan. PT Plan: Discharge plan remains appropriate;Frequency remains appropriate PT Frequency: Min 3X/week Recommendations for Other Services: OT consult Follow Up Recommendations: Skilled nursing facility Equipment Recommended: Defer to next venue PT Goals  Acute Rehab PT Goals PT Goal Formulation: With patient Time For Goal Achievement: 7 days Pt will go Supine/Side to Sit: with modified independence PT Goal: Supine/Side to Sit - Progress: Met Pt will go Sit to Supine/Side: with modified independence PT Goal: Sit to Supine/Side - Progress: Met Pt will go Sit to Stand: with modified independence PT Goal: Sit to Stand - Progress: Progressing toward goal Pt will go Stand to Sit: with modified independence PT Goal: Stand to Sit - Progress: Progressing toward goal Pt will Ambulate: >150 feet;with modified independence;with least restrictive assistive device PT Goal: Ambulate - Progress: Updated due to goal met Pt will Perform Home Exercise Program: Independently PT Goal: Perform Home Exercise Program - Progress: Goal set today  PT Treatment Precautions/Restrictions  Precautions Precautions: Fall Required Braces or Orthoses: No Restrictions Weight Bearing Restrictions: No Mobility (including Balance) Bed Mobility Bed Mobility: Yes Rolling Left: 6: Modified independent (Device/Increase time) Left Sidelying to Sit: 6: Modified independent (Device/Increase time) Sitting - Scoot to Edge of Bed: 6: Modified independent (Device/Increase time) Sit to Supine:  6: Modified independent (Device/Increase time) Transfers Transfers: Yes Sit to Stand: 5: Supervision;From bed;With upper extremity assist Stand to Sit: 5: Supervision;With upper extremity assist;To bed Ambulation/Gait Ambulation/Gait: Yes Ambulation/Gait Assistance: 4: Min assist (min-guard A) Ambulation/Gait Assistance Details (indicate cue type and reason): let pt push IV pole with left hand.  Pt very cautious with ambulation and making a point of trying to purposely look to left side.  Stamina is improving but pt continues to be limited by fatigue. Ambulation Distance (Feet): 200 Feet Assistive device: Other (Comment) (IV pole) Gait Pattern: Decreased stride length;Step-through pattern Gait velocity: decreased Stairs: No Wheelchair Mobility Wheelchair Mobility: No  Posture/Postural Control Posture/Postural Control: No significant limitations Balance Balance Assessed: Yes Static Standing Balance Static Standing - Balance Support: No upper extremity supported Static Standing - Level of Assistance: 5: Stand by assistance Dynamic Standing Balance Dynamic Standing - Balance Support: No upper extremity supported Dynamic Standing - Level of Assistance: 5: Stand by assistance Exercise  General Exercises - Lower Extremity Gluteal Sets: AROM;Strengthening;Both;10 reps;Supine Long Arc Quad: AROM;Strengthening;Both;10 reps;Seated (with slow return) Heel Slides: AROM;Strengthening;Both;10 reps;Seated Hip Flexion/Marching: AROM;Strengthening;Both;10 reps;Seated End of Session PT - End of Session Equipment Utilized During Treatment: Gait belt Activity Tolerance: Patient limited by fatigue Patient left: in bed;with call bell in reach Nurse Communication: Mobility status for ambulation General Behavior During Session: [facility] for tasks performed Cognition: [facility] for tasks performed  Krisalyn Yankowski, [place]  [phone] 01/12/2012, 4:17 PM

## 2012-01-12 NOTE — Progress Notes (Signed)
Utilization review complete 

## 2012-01-12 NOTE — Progress Notes (Signed)
Patient discussed at the Long Length of Stay Donna Tapia 01/12/2012  

## 2012-01-12 NOTE — Progress Notes (Signed)
Patient ID: Donna Tapia, female   DOB: [birth year], 61 y.o.   MRN: [id number]  S: No eye pain. Some temporal FBS OS.  Otherwise, no new ocular issues.  Still with back pain.    Relevant meds:  IV: Vanc  Topical: poly/bac ung alternating with lacrilube Q 3 hrs.   Exam:  Motility OS:-2 all directions.   Lighted bedside exam  OD  External/adnexa: Normal  Lids/lashes: Normal  Conjunctiva White, quiet  Cornea: Clear  OS  External/adnexa: 1+ periocular erythema, 1+ edema.Soft on retropulsion of orbital contents  Lids/lashes: 1+ edema and trace erythema; upper and lower bolstered temporal tarsorrhaphy in place; small amount of exposed/prolapsed conj nasally (improved); Conformer in place with iodoform gauze extending through central opening.  Conjunctiva: small amount of nasal exposure. 1+ hyperemia and post-surgical change. Covered by conformer.  Relevant LABS:  WBC: 5.7 Path: Intraocular contents with suppurative inflammation; no mass//malignancy.  Micro:  Blood, lumbosacral epidural abscess, psoas abscess, AC/Vit: No growth.  Intraocular contents gram stain: GPC in pairs  Intraocular contents culture: No growth.  Imaging:  CT MXF 01/09/12: Orbital cellulitis; no intraorbital abscess. Nml cavernous sinus, brain.  TEE: nml valves.  .  Assessment and Plan:  -- Endogenous endophthalmitis/panophthalmitis OS. POD #5 s/p evisceration OS.  Donna Tapia continues to demonstrate clinical improvement.  ~1/3 of the iodoform gauze removed at the the bedside 01/11/12  Rec:  Continue Poly/bac alternating with lacrlilube to small amount of exposed conj OS.  Continue broad spectrum IV abx. NeuroSx recs are for 2 months.  Based on notes will have repeat imaging of lower spine later this week.

## 2012-01-13 LAB — BASIC METABOLIC PANEL
Chloride: 100 mEq/L (ref 96–112)
Creatinine, Ser: 0.55 mg/dL (ref 0.50–1.10)
GFR calc Af Amer: >90 mL/min (ref >90)
GFR calc non Af Amer: >90 mL/min (ref >90)

## 2012-01-13 LAB — CBC
MCHC: 33.2 g/dL (ref 30.0–36.0)
MCV: 92.7 fL (ref 78.0–100.0)
Platelets: 563 10*3/uL — ABNORMAL HIGH (ref 150–400)
RDW: 15.1 % (ref 11.5–15.5)
WBC: 6 10*3/uL (ref 4.0–10.5)

## 2012-01-13 MED ORDER — DIPHENHYDRAMINE HCL 50 MG/ML IJ SOLN
12.5000 mg | Freq: Four times a day (QID) | INTRAMUSCULAR | Status: DC | PRN
  Administered 2012-01-13 – 2012-01-19 (×9): 12.5 mg via INTRAVENOUS
  Filled 2012-01-13 (×9): qty 1

## 2012-01-13 NOTE — Evaluation (Addendum)
Occupational Therapy Evaluation Patient Details Name: Donna Tapia MRN: [id number] DOB: Feb 11, 1951 Today's Date: 01/13/2012  Problem List:  Patient Active Problem List  Diagnoses  . Dehydration  . Renal failure, acute  . Uveitis, anterior  . Nausea & vomiting  . Lobar pneumonia due to unspecified organism    Past Medical History:  Past Medical History  Diagnosis Date  . Hepatitis B   . Anemia    Past Surgical History:  Past Surgical History  Procedure Date  . Abdominal hysterectomy   . Evisceration 01/07/2012    Procedure: EVISCERATION REPAIR;  Surgeon: Antony Contras, MD;  Location: [facility] OR;  Service: Ophthalmology;  Laterality: Left;  Evisceration and removal of contents left eye  . 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 Pt. With PNA and s/p lumbar epidural abscess removal. Pt. will benefit from skilled OT to increase functional independence to mod I at D/C home.    OT Recommendation/Assessment: Patient will need skilled OT in the acute care venue OT Problem List: Decreased strength;Decreased activity tolerance;Impaired balance (sitting and/or standing);Decreased knowledge of use of DME or AE;Decreased knowledge of precautions;Decreased safety awareness Barriers to Discharge: None OT Therapy Diagnosis : Generalized weakness;Disturbance of vision OT Plan OT Frequency: Min 2X/week OT Treatment/Interventions: Self-care/ADL training;DME and/or AE instruction;Therapeutic activities;Patient/family education;Balance training OT Recommendation Follow Up Recommendations: Home health OT;Supervision - Intermittent Equipment Recommended: None recommended by OT Individuals  Consulted Consulted and Agree with Results and Recommendations: Patient OT Goals Acute Rehab OT Goals OT Goal Formulation: With patient Time For Goal Achievement: 7 days ADL Goals Pt Will Perform Lower Body Bathing: with modified independence;Sit to stand from bed ADL Goal: Lower Body Bathing - Progress: Goal set today Pt Will Perform Lower Body Dressing: with modified independence;Sit to stand from bed;with adaptive equipment ADL Goal: Lower Body Dressing - Progress: Goal set today Pt Will Transfer to Toilet: with modified independence;with DME;Ambulation;3-in-1 ADL Goal: Toilet Transfer - Progress: Goal set today Pt Will Perform Toileting - Hygiene: Independently;Sit to stand from 3-in-1/toilet ADL Goal: Toileting - Hygiene - Progress: Goal set today Pt Will Perform Tub/Shower Transfer: Shower transfer;with modified independence;Ambulation;with DME;Shower seat with back ADL Goal: Web designer - Progress: Goal set today Additional ADL Goal #1: Pt. will demonstrate scanning of environment on left without verbal cuing to increase safety ADL Goal: Additional Goal #1 - Progress: Goal set today  OT Evaluation Precautions/Restrictions  Precautions Precautions: Fall Required Braces or Orthoses: No Restrictions Weight Bearing Restrictions: No Prior Functioning Home Living Lives With: Alone Type of Home: House Home Layout: One level Home Access: Level entry Bathroom Shower/Tub: Health visitor: Handicapped height Bathroom Accessibility: Yes How Accessible: Accessible via walker Home Adaptive Equipment: Grab bars around toilet;Grab bars in shower Additional Comments: Pt. living in indepedent living community and  Prior Function Level of Independence: Independent with basic ADLs;Independent with transfers;Independent with homemaking with ambulation;Independent with gait Able to Take Stairs?: Yes Driving: Yes Vocation: Retired ADL ADL Eating/Feeding:  Performed;Independent Where Assessed - Eating/Feeding: Chair Grooming: Performed;Wash/dry hands;Wash/dry face;Set up;Supervision/safety Where Assessed - Grooming: Standing at sink Upper Body Bathing: Simulated;Chest;Right arm;Left arm;Abdomen;Set up Where Assessed - Upper Body Bathing: Standing at sink Lower Body Bathing: Simulated;Set up Where Assessed - Lower Body Bathing: Sitting, chair  Upper Body Dressing: Performed;Set up Upper Body Dressing Details (indicate cue type and reason): with donning gown Where Assessed - Upper Body Dressing: Sitting, chair Lower Body Dressing: Simulated;Set up Where Assessed - Lower Body Dressing: Sitting, chair Toilet Transfer: Simulated;Minimal assistance Toilet Transfer Details (indicate cue type and reason): Min verbal cues for hand placement  Toilet Transfer Method: Ambulating Toilet Transfer Equipment: Bedside commode Toileting - Clothing Manipulation: Performed;Modified independent Where Assessed - Toileting Clothing Manipulation: Sit on 3-in-1 or toilet Toileting - Hygiene: Performed;Independent Where Assessed - Toileting Hygiene: Sit on 3-in-1 or toilet Tub/Shower Transfer: Not assessed Tub/Shower Transfer Method: Not assessed Equipment Used: Rolling walker Ambulation Related to ADLs: Pt. provided with close supervision ~75' with RW and min verbal cues to attend to left side due to visual deficit ADL Comments: Pt. educated on completing LB ADLs and crossing foot over opposite knee to decrease strain on back due to pain.  Vision/Perception  Vision - History Baseline Vision: No visual deficits Patient Visual Report: Other (comment) (pt. with patch of left eye due to pt. S/p left eye evisceration)   Extremity Assessment RUE Assessment RUE Assessment: Within Functional Limits LUE Assessment LUE Assessment: Within Functional Limits Mobility  Bed Mobility Bed Mobility: Yes Rolling Left: 6: Modified independent (Device/Increase time) Left  Sidelying to Sit: 6: Modified independent (Device/Increase time) Sitting - Scoot to Edge of Bed: 6: Modified independent (Device/Increase time) Sit to Supine: 6: Modified independent (Device/Increase time) Transfers Transfers: Yes Sit to Stand: 5: Supervision;From bed;With upper extremity assist Sit to Stand Details (indicate cue type and reason): Min verbal cues for hand placement    End of Session OT - End of Session Equipment Utilized During Treatment: Gait belt Activity Tolerance: Patient tolerated treatment well Patient left: in chair;with call bell in reach Nurse Communication: Mobility status for transfers General Behavior During Session: [facility] for tasks performed Cognition: [facility] for tasks performed   Kamori Kitchens, OTR/L Pager [phone] 01/13/2012, 1:40 PM

## 2012-01-13 NOTE — Progress Notes (Signed)
PT Note  Attempted x 2.  Pt had worked with OT and had just returned to bed.  Discussed with OT.  Pt appears to be mobilizing much better.  Will probably be able to return home.  Will see tomorrow AM.  Skip Mayer PT [phone]

## 2012-01-13 NOTE — Progress Notes (Signed)
3 Days Post-Op  Subjective: Patient without acute changes  Objective: Vital signs in last 24 hours: Temp:  [97.9 F (36.6 C)-99.2 F (37.3 C)] 97.9 F (36.6 C) (03/14 0553) Pulse Rate:  [81-88] 81  (03/14 0553) Resp:  [18] 18  (03/14 0553) BP: (119-145)/(72-85) 131/85 mmHg (03/14 0553) SpO2:  [96 %-98 %] 96 % (03/14 0553) Weight:  [133 lb 6.1 oz (60.5 kg)] 133 lb 6.1 oz (60.5 kg) (03/14 0553) Last BM Date: 01/12/12  Intake/Output from previous day: 03/13 0701 - 03/14 0700 In: 465 [P.O.:462; I.V.:3] Out: 1867 [Urine:1850; Drains:16; Stool:1] Intake/Output this shift: Total I/O In: 240 [P.O.:240] Out: -   Left psoas drain intact, insertion site ok, mildly tender, output about 15 cc's today, cx's neg.  Lab Results:   Basename 01/13/12 0530 01/12/12 0500  WBC 6.0 5.7  HGB 10.6* 10.8*  HCT 31.9* 33.0*  PLT 563* 630*   BMET  Basename 01/13/12 0530 01/12/12 0500  NA 136 138  K 3.8 3.6  CL 100 102  CO2 29 28  GLUCOSE 88 81  BUN 5* 4*  CREATININE 0.55 0.62  CALCIUM 8.4 8.2*   PT/INR No results found for this basename: LABPROT:2,INR:2 in the last 72 hours ABG No results found for this basename: PHART:2,PCO2:2,PO2:2,HCO3:2 in the last 72 hours  Studies/Results: No results found.  Anti-infectives: Anti-infectives     Start     Dose/Rate Route Frequency Ordered Stop   01/07/12 2137   ceFAZolin (ANCEF) injection  Status:  Discontinued          As needed 01/07/12 2138 01/07/12 2217   01/07/12 1930   ceFAZolin (ANCEF) 200 mg/mL injection 200 mg  Status:  Discontinued        1 mL Subconjunctival  Once 01/07/12 1924 01/13/12 1026   01/07/12 1930   ceFAZolin (ANCEF) 200 mg/mL injection 200 mg  Status:  Discontinued        200 mg Subconjunctival  Once 01/07/12 1926 01/13/12 1026   01/06/12 0200   vancomycin (VANCOCIN) 750 mg in sodium chloride 0.9 % 150 mL IVPB        750 mg 150 mL/hr over 60 Minutes Intravenous Every 8 hours 01/05/12 1952     01/05/12 1300    cefTAZidime (FORTAZ) ophth injection 100 mg        100 mg Subconjunctival  Once 01/05/12 1247 01/05/12 1443   01/05/12 1300   vancomycin (VANCOCIN) ophth injection 25 mg        25 mg Subconjunctival  Once 01/05/12 1247 01/05/12 1446   01/05/12 1300   vancomycin (VANCOCIN) injection INJ 1 mg        1 mg Subconjunctival  Once 01/05/12 1247 01/05/12 1446   01/05/12 1200   cefTAZidime (FORTAZ) ophth injection 2.25 mg        2.25 mg Intravitreal  Once 01/05/12 1053 01/05/12 1444   01/04/12 2134   50,000 units bacitracin in 0.9% normal saline 250 mL irrigation  Status:  Discontinued          As needed 01/04/12 2145 01/04/12 2247   01/04/12 2100   bacitracin 56213 UNITS injection     Comments: AURAND, BRANDI: cabinet override         01/04/12 2100 01/05/12 0859   01/04/12 2100   ceFAZolin (ANCEF) 1-5 GM-% IVPB     Comments: AURAND, BRANDI: cabinet override         01/04/12 2100 01/05/12 0859   01/04/12 1830   fluconazole (DIFLUCAN) IVPB  400 mg  Status:  Discontinued        400 mg 200 mL/hr over 60 Minutes Intravenous Every 24 hours 01/03/12 1827 01/07/12 1616   01/03/12 1930   fluconazole (DIFLUCAN) IVPB 800 mg        800 mg 400 mL/hr over 60 Minutes Intravenous  Once 01/03/12 1826 01/03/12 2139   01/03/12 1245   cefTAZidime (FORTAZ) ophth injection 2.25 mg  Status:  Discontinued        2.25 mg Intravitreal  Once 01/03/12 1135 01/09/12 0427   01/03/12 1245   cefTAZidime (FORTAZ) ophth injection 100 mg  Status:  Discontinued        100 mg Subconjunctival  Once 01/03/12 1135 01/09/12 0427   01/03/12 1200   vancomycin (VANCOCIN) ophth injection 0.1 mg  Status:  Discontinued        0.1 mg Intravitreal  Once 01/03/12 1130 01/09/12 0427   01/03/12 1200   vancomycin (VANCOCIN) ophth injection 25 mg  Status:  Discontinued        25 mg Subconjunctival  Once 01/03/12 1131 01/09/12 0427   01/03/12 0500   vancomycin (VANCOCIN) 750 mg in sodium chloride 0.9 % 150 mL IVPB  Status:  Discontinued         750 mg 150 mL/hr over 60 Minutes Intravenous Every 12 hours 01/02/12 1716 01/05/12 1953   01/03/12 0200   ceFEPIme (MAXIPIME) 2 g in dextrose 5 % 50 mL IVPB  Status:  Discontinued        2 g 100 mL/hr over 30 Minutes Intravenous Every 8 hours 01/02/12 1716 01/02/12 2041   01/02/12 2200   cefTAZidime (FORTAZ) 1 g in dextrose 5 % 50 mL IVPB        1 g 100 mL/hr over 30 Minutes Intravenous 3 times per day 01/02/12 2106     01/02/12 1400   ceFEPIme (MAXIPIME) 2 g in dextrose 5 % 50 mL IVPB  Status:  Discontinued        2 g 100 mL/hr over 30 Minutes Intravenous 3 times per day 01/02/12 1318 01/02/12 1716   01/02/12 1400   vancomycin (VANCOCIN) 750 mg in sodium chloride 0.9 % 150 mL IVPB  Status:  Discontinued        750 mg 150 mL/hr over 60 Minutes Intravenous Every 12 hours 01/02/12 1350 01/02/12 1716   12/31/11 1530   valACYclovir (VALTREX) tablet 2,000 mg        2,000 mg Oral 2 times daily 12/31/11 1358 12/31/11 2235   12/31/11 0100   moxifloxacin (AVELOX) IVPB 400 mg  Status:  Discontinued        400 mg 250 mL/hr over 60 Minutes Intravenous Every 24 hours 12/31/11 0040 01/03/12 1913          Assessment/Plan: s/p left psoas abscess drainage 3/8; recommend f/u CT on 3/15 to assess adequacy of drainage/possible removal    LOS: 14 days    Dorotha Hirschi,D [facility] 01/13/2012

## 2012-01-13 NOTE — Progress Notes (Signed)
Clinical Social Worker met with pt at length to discuss pt discharge planning. Pt pleasant and cooperative during discussion. Discussed with pt concern about pt returning home at discharge as she will need two months of IV antibiotics. Pt stated that she has multiple supports in her senior living community and has been discussing with friends about providing support when discharged. Pt is agreeable to exploring SNF options as a secondary option, but states that her "goal is to return home unless she absolutely can't". Pt discussed that pt is able to press a call button in the senior living community for assistance and agreeable to Clinical Social Worker and Case Manager to contact senior living community to discuss support that can be provided. Clinical Social Worker to initiate SNF search as secondary option for pt and continue to monitor pt progress. Pt would be letter of guarantee for SNF placement as pt does not currently have insurance, but has applied for Medicaid. Clinical Social Worker to follow up with pt in regard to bed offers and follow up in regard to discharge plan. Clinical Social Worker to facilitate pt discharge needs if appropriate.  Jacklynn Lewis, MSW, LCSWA  Clinical Social Work [phone]

## 2012-01-13 NOTE — Progress Notes (Addendum)
Patient ID: TOSHIBA NULL, female   DOB: [birth year], 62 y.o.   MRN: [id number]  S: No eye pain.  Still with back pain.   Relevant meds:  IV: Vanc  Topical: poly/bac ung alternating with lacrilube Q 3 hrs.    Exam:   Motility OS:-2 all directions.   Lighted bedside exam  OD  External/adnexa: Normal  Lids/lashes: Normal  Conjunctiva White, quiet  Cornea: Clear  OS  External/adnexa: trace edema.Soft on retropulsion of orbital contents  Lids/lashes: 1+ edema; upper and lower bolstered temporal tarsorrhaphy in place; small amount of exposed/prolapsed conj nasally (improved); Conformer in place with iodoform gauze extending through central opening.  Conjunctiva: small amount of nasal exposure. Post-surgical change. Covered by conformer.  Relevant LABS:  WBC: 6.0 Path: Intraocular contents with suppurative inflammation; no mass//malignancy.  Micro:  Blood, lumbosacral epidural abscess, psoas abscess, AC/Vit: No growth.  Intraocular contents gram stain: GPC in pairs  Intraocular contents culture: No growth.   Imaging:  No new studies.   Assessment and Plan:  -- Endogenous endophthalmitis/panophthalmitis OS. POD #6 s/p evisceration OS.  Ms Pollio continues to demonstrate clinical improvement.  ~1/3 of the iodoform gauze removed at the the bedside 01/11/12  Rec:  Continue Poly/bac alternating with lacrlilube to small amount of exposed conj OS.  Continue broad spectrum IV abx. NeuroSx recs are for 2 months.  Based on notes will have repeat imaging of lower spine later this week.

## 2012-01-13 NOTE — Progress Notes (Signed)
Subjective: Rash essentially unchanged per patient  Objective: Vital signs in last 24 hours: Filed Vitals:   01/12/12 1547 01/12/12 2236 01/13/12 0500 01/13/12 0553  BP: 119/72 145/76  131/85  Pulse: 88 86  81  Temp: 98.3 F (36.8 C) 99.2 F (37.3 C)  97.9 F (36.6 C)  TempSrc: Oral Oral  Oral  Resp: 18 18  18   Height:      Weight:   60.5 kg (133 lb 6.1 oz) 60.5 kg (133 lb 6.1 oz)  SpO2: 98% 96%  96%   Weight change: -2.6 kg (-5 lb 11.7 oz)  Intake/Output Summary (Last 24 hours) at 01/13/12 1209 Last data filed at 01/13/12 0900  Gross per 24 hour  Intake    483 ml  Output   1467 ml  Net   -984 ml    Physical Exam: General: Awake, Oriented, comforatbale HEENT: L eye removed , covered Neck: Supple CV: S1 and S2, rrr Lungs: clear ant Abdomen: Soft, Nontender, Nondistended, +bowel sounds. Ext: Good pulses. No edema., weakness B/L Neuro:Non Focal  Lab Results:  Basename 01/13/12 0530 01/12/12 0500  NA 136 138  K 3.8 3.6  CL 100 102  CO2 29 28  GLUCOSE 88 81  BUN 5* 4*  CREATININE 0.55 0.62  CALCIUM 8.4 8.2*  MG -- --  PHOS -- --   No results found for this basename: AST:2,ALT:2,ALKPHOS:2,BILITOT:2,PROT:2,ALBUMIN:2 in the last 72 hours No results found for this basename: LIPASE:2,AMYLASE:2 in the last 72 hours  Basename 01/13/12 0530 01/12/12 0500  WBC 6.0 5.7  NEUTROABS -- --  HGB 10.6* 10.8*  HCT 31.9* 33.0*  MCV 92.7 94.0  PLT 563* 630*   No results found for this basename: CKTOTAL:3,CKMB:3,CKMBINDEX:3,TROPONINI:3 in the last 72 hours No components found with this basename: POCBNP:3 No results found for this basename: DDIMER:2 in the last 72 hours No results found for this basename: HGBA1C:2 in the last 72 hours No results found for this basename: CHOL:2,HDL:2,LDLCALC:2,TRIG:2,CHOLHDL:2,LDLDIRECT:2 in the last 72 hours No results found for this basename: TSH,T4TOTAL,FREET3,T3FREE,THYROIDAB in the last 72 hours No results found for this basename:  VITAMINB12:2,FOLATE:2,FERRITIN:2,TIBC:2,IRON:2,RETICCTPCT:2 in the last 72 hours  Micro Results: Recent Results (from the past 240 hour(s))  EYE CULTURE     Status: Normal   Collection Time   01/03/12  1:45 PM      Component Value Range Status Comment   Specimen Description EYE LEFT   Final    Special Requests     Final    Value: VTREOUS PT ON VANC CEFTAZ DO SENS TO ALL GROWTH INCLUDING COAG NEG STAPH PER MD   Culture NO GROWTH 7 DAYS   Final    Report Status 01/11/2012 FINAL   Final   EYE CULTURE     Status: Normal   Collection Time   01/03/12  1:45 PM      Component Value Range Status Comment   Specimen Description EYE LEFT   Final    Special Requests     Final    Value: ANTEROR CHAMBER PT ON VANC CEFTAZ DO SENS TO ALL GROWTH INCLUDING COAG NEG STAPH PER MD   Culture NO GROWTH 7 DAYS   Final    Report Status 01/11/2012 FINAL   Final   CULTURE, BLOOD (ROUTINE X 2)     Status: Normal   Collection Time   01/03/12  8:32 PM      Component Value Range Status Comment   Specimen Description BLOOD RIGHT HAND   Final  Special Requests BOTTLES DRAWN AEROBIC ONLY 10CC   Final    Culture  Setup Time [id number]   Final    Culture NO GROWTH 5 DAYS   Final    Report Status 01/10/2012 FINAL   Final   CULTURE, BLOOD (ROUTINE X 2)     Status: Normal   Collection Time   01/03/12  8:39 PM      Component Value Range Status Comment   Specimen Description BLOOD RIGHT FOREARM   Final    Special Requests BOTTLES DRAWN AEROBIC ONLY 10CC   Final    Culture  Setup Time [id number]   Final    Culture NO GROWTH 5 DAYS   Final    Report Status 01/10/2012 FINAL   Final   CULTURE, SPUTUM-ASSESSMENT     Status: Normal   Collection Time   01/04/12  6:48 AM      Component Value Range Status Comment   Specimen Description SPUTUM   Final    Special Requests NONE   Final    Sputum evaluation     Final    Value: MICROSCOPIC FINDINGS SUGGEST THAT THIS SPECIMEN IS NOT REPRESENTATIVE OF LOWER RESPIRATORY SECRETIONS.  PLEASE RECOLLECT.     CALLED TO [facility] RN 01/04/12 0800 COSTELLO B   Report Status 01/04/2012 FINAL   Final   MRSA PCR SCREENING     Status: Normal   Collection Time   01/04/12  4:20 PM      Component Value Range Status Comment   MRSA by PCR NEGATIVE  NEGATIVE  Final   CULTURE, ROUTINE-ABSCESS     Status: Normal   Collection Time   01/04/12  9:46 PM      Component Value Range Status Comment   Specimen Description ABSCESS BACK   Final    Special Requests PATIENT ON FOLLOWING LORTAZ, DIFLUCAN EPIDURAL   Final    Gram Stain     Final    Value: ABUNDANT WBC PRESENT,BOTH PMN AND MONONUCLEAR     NO ORGANISMS SEEN   Culture NO GROWTH 3 DAYS   Final    Report Status 01/08/2012 FINAL   Final   ANAEROBIC CULTURE     Status: Normal   Collection Time   01/04/12  9:46 PM      Component Value Range Status Comment   Specimen Description ABSCESS BACK   Final    Special Requests PATIENT ON FOLLOWING LORTAZ, DIFLUCAN EPIDURAL   Final    Gram Stain     Final    Value: ABUNDANT WBC PRESENT,BOTH PMN AND MONONUCLEAR     NO ORGANISMS SEEN     Performed at [facility]   Culture NO ANAEROBES ISOLATED   Final    Report Status 01/09/2012 FINAL   Final   GRAM STAIN     Status: Normal   Collection Time   01/04/12  9:46 PM      Component Value Range Status Comment   Specimen Description ABSCESS BACK   Final    Special Requests PATIENT ON FOLLOWING LORTAZ, DIFLUCAN EPIDURAL   Final    Gram Stain     Final    Value: ABUNDANT WBC PRESENT,BOTH PMN AND MONONUCLEAR     NO ORGANISMS SEEN   Report Status 01/04/2012 FINAL   Final   CULTURE, ROUTINE-ABSCESS     Status: Normal   Collection Time   01/07/12  4:14 PM      Component Value Range Status Comment   Specimen Description  ABSCESS   Final    Special Requests PSOAS MUSCLE   Final    Gram Stain     Final    Value: MODERATE WBC PRESENT, PREDOMINANTLY PMN     NO SQUAMOUS EPITHELIAL CELLS SEEN     NO ORGANISMS SEEN   Culture NO GROWTH 3 DAYS   Final    Report  Status 01/11/2012 FINAL   Final   CULTURE, ROUTINE-ABSCESS     Status: Normal   Collection Time   01/07/12  9:03 PM      Component Value Range Status Comment   Specimen Description ABSCESS EYE LEFT   Final    Special Requests INTRAOCULAR   Final    Gram Stain     Final    Value: ABUNDANT WBC PRESENT,BOTH PMN AND MONONUCLEAR     RARE GRAM POSITIVE COCCI IN PAIRS     Performed at [facility]   Culture NO GROWTH 3 DAYS   Final    Report Status 01/11/2012 FINAL   Final   ANAEROBIC CULTURE     Status: Normal   Collection Time   01/07/12  9:03 PM      Component Value Range Status Comment   Specimen Description ABSCESS EYE LEFT   Final    Special Requests INTRAOCULAR   Final    Gram Stain     Final    Value: ABUNDANT WBC PRESENT,BOTH PMN AND MONONUCLEAR     RARE GRAM POSITIVE COCCI IN PAIRS     Performed at [facility]   Culture NO ANAEROBES ISOLATED   Final    Report Status 01/12/2012 FINAL   Final   GRAM STAIN     Status: Normal   Collection Time   01/07/12  9:03 PM      Component Value Range Status Comment   Specimen Description ABSCESS EYE LEFT   Final    Special Requests INTRAOCULAR   Final    Gram Stain     Final    Value: ABUNDANT WBC PRESENT,BOTH PMN AND MONONUCLEAR     RARE GRAM POSITIVE COCCI IN PAIRS   Report Status 01/07/2012 FINAL   Final     Studies/Results: No results found.  Medications: I have reviewed the patient's current medications. Scheduled Meds:    . artificial tears   Left Eye Q6H WA  . bacitracin-polymyxin b   Left Eye Q6H WA  . cefTAZidime (FORTAZ)  IV  1 g Intravenous Q8H  . enoxaparin (LOVENOX) injection  40 mg Subcutaneous Q24H  . feeding supplement  237 mL Oral BID  . pantoprazole  40 mg Oral BID AC  . polyethylene glycol  17 g Oral Daily  . sodium chloride  3 mL Intravenous Q12H  . vancomycin  750 mg Intravenous Q8H  . DISCONTD: ceFAZolin  1 mL Subconjunctival Once  . DISCONTD: ceFAZolin  200 mg Subconjunctival Once    Continuous Infusions:   PRN Meds:.acetaminophen, acetaminophen, alum & mag hydroxide-simeth, diphenhydrAMINE, diphenhydrAMINE, HYDROcodone-acetaminophen, hydrocortisone cream, HYDROmorphone (DILAUDID) injection, ondansetron (ZOFRAN) IV, ondansetron  Assessment/Plan: Interim note  66 female with chr low back pain and hx of hep B presented with 2 days hx of painless left eye blindness with hx of nausea. Vomiting and diarrhea 1 week back with findings of left anterior uveitis and multilobar left sided pneumonia on CT scan.  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  evident on MRI from 3/5 and likely the source of infection  Drain placed by IR- fell out on 3/7  drain placed in L psoas muscle on 3/8, IR continues to follow Cont empiric abx -ID will need to clarify antibiotic duration Plan is to rescan later this week  Left sided multilobar PNA  Secondary to infection- on abx  Anemia  Iron deficiency noted on iron panel  b12 and folate wnl  Stool for occult blood negative  Will monitor closely, hold off on  trasnfusion For now until acute issues resolve  GI eval as out patient once acute issue resolved   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   Hypokalemia resolved  Diarrhea  Prior to admission  c diff negative  resolved   DVT prophylaxis: lovenox SCD   Full code   Disposition -likely [facility]/RN vs SNF    LOS: 14 days  Graceson Nichelson, DO 01/13/2012, 12:09 PM

## 2012-01-13 NOTE — Progress Notes (Signed)
   CARE MANAGEMENT NOTE 01/13/2012  Patient:  Donna Tapia, Donna Tapia   Account Number:  [id number]  Date Initiated:  01/06/2012  Documentation initiated by:  Letha Cape  Subjective/Objective Assessment:   dx dehydration  admit lives alone.     Action/Plan:   pt/ot eval- rec ? snf   Anticipated DC Date:  01/10/2012   Anticipated DC Plan:  HOME/SELF CARE      DC Planning Services  CM consult      Choice offered to / List presented to:             Status of service:  In process, will continue to follow Medicare Important Message given?   (If response is "NO", the following Medicare IM given date fields will be blank) Date Medicare IM given:   Date Additional Medicare IM given:    Discharge Disposition:    Per UR Regulation:    If discussed at Long Length of Stay Meetings, dates discussed:   01/12/2012    Comments:  01/12/12- 1100- Donn Pierini RN, BSN [phone] Pt had to have left eye removed on 01/07/12, still has abscess drain, will need long term IV abx, FC office to see pt today to start Medicaid application process. Pt may need SNF.  01/06/12 9:46 Letha Cape RN, BSN [phone] patient lives alone.  Per physical therapy patient may need st snf if she feels she is unable to manage at home.  NCM will continue to follow for dc needs.

## 2012-01-13 NOTE — Progress Notes (Signed)
ID daily progress note  Subjective:  Patient is sitting comfortably or lying on the side, states back pain is improving. Denies fever, chills or new symptoms.   Objective: Vital signs in last 24 hours: Temp:  [97.9 F (36.6 C)-99.2 F (37.3 C)] 97.9 F (36.6 C) (03/14 0553) Pulse Rate:  [81-88] 81  (03/14 0553) Resp:  [18] 18  (03/14 0553) BP: (119-145)/(72-85) 131/85 mmHg (03/14 0553) SpO2:  [96 %-98 %] 96 % (03/14 0553) Weight:  [60.5 kg (133 lb 6.1 oz)] 60.5 kg (133 lb 6.1 oz) (03/14 0553)  Intake/Output from previous day: 03/13 0701 - 03/14 0700 In: 465 [P.O.:462; I.V.:3] Out: 1867 [Urine:1850; Drains:16; Stool:1] Intake/Output this shift:   Physical exam: Const: patient is comfortable, in no acute distress HEENT: EOMI, normal oropharynx Cv: RRR, tachycardia, no m/r/g LUNGS: CTA bilaterally SKIN: surgical drain placed no the back, fading rash on the back   Lab Results  Basename 01/13/12 0530 01/12/12 0500  WBC 6.0 5.7  HGB 10.6* 10.8*  HCT 31.9* 33.0*  NA 136 138  K 3.8 3.6  CL 100 102  CO2 29 28  BUN 5* 4*  CREATININE 0.55 0.62  GLU -- --   Liver Panel No results found for this basename: PROT:2,ALBUMIN:2,AST:2,ALT:2,ALKPHOS:2,BILITOT:2,BILIDIR:2,IBILI:2 in the last 72 hours Sedimentation Rate No results found for this basename: ESRSEDRATE in the last 72 hours C-Reactive Protein No results found for this basename: CRP:2 in the last 72 hours  Microbiology: Recent Results (from the past 240 hour(s))  EYE CULTURE     Status: Normal   Collection Time   01/03/12  1:45 PM      Component Value Range Status Comment   Specimen Description EYE LEFT   Final    Special Requests     Final    Value: VTREOUS PT ON VANC CEFTAZ DO SENS TO ALL GROWTH INCLUDING COAG NEG STAPH PER MD   Culture NO GROWTH 7 DAYS   Final    Report Status 01/11/2012 FINAL   Final   EYE CULTURE     Status: Normal   Collection Time   01/03/12  1:45 PM      Component Value Range Status Comment     Specimen Description EYE LEFT   Final    Special Requests     Final    Value: ANTEROR CHAMBER PT ON VANC CEFTAZ DO SENS TO ALL GROWTH INCLUDING COAG NEG STAPH PER MD   Culture NO GROWTH 7 DAYS   Final    Report Status 01/11/2012 FINAL   Final   CULTURE, BLOOD (ROUTINE X 2)     Status: Normal   Collection Time   01/03/12  8:32 PM      Component Value Range Status Comment   Specimen Description BLOOD RIGHT HAND   Final    Special Requests BOTTLES DRAWN AEROBIC ONLY 10CC   Final    Culture  Setup Time [id number]   Final    Culture NO GROWTH 5 DAYS   Final    Report Status 01/10/2012 FINAL   Final   CULTURE, BLOOD (ROUTINE X 2)     Status: Normal   Collection Time   01/03/12  8:39 PM      Component Value Range Status Comment   Specimen Description BLOOD RIGHT FOREARM   Final    Special Requests BOTTLES DRAWN AEROBIC ONLY 10CC   Final    Culture  Setup Time [id number]   Final    Culture NO GROWTH 5  DAYS   Final    Report Status 01/10/2012 FINAL   Final   CULTURE, SPUTUM-ASSESSMENT     Status: Normal   Collection Time   01/04/12  6:48 AM      Component Value Range Status Comment   Specimen Description SPUTUM   Final    Special Requests NONE   Final    Sputum evaluation     Final    Value: MICROSCOPIC FINDINGS SUGGEST THAT THIS SPECIMEN IS NOT REPRESENTATIVE OF LOWER RESPIRATORY SECRETIONS. PLEASE RECOLLECT.     CALLED TO [facility] RN 01/04/12 0800 COSTELLO B   Report Status 01/04/2012 FINAL   Final   MRSA PCR SCREENING     Status: Normal   Collection Time   01/04/12  4:20 PM      Component Value Range Status Comment   MRSA by PCR NEGATIVE  NEGATIVE  Final   CULTURE, ROUTINE-ABSCESS     Status: Normal   Collection Time   01/04/12  9:46 PM      Component Value Range Status Comment   Specimen Description ABSCESS BACK   Final    Special Requests PATIENT ON FOLLOWING LORTAZ, DIFLUCAN EPIDURAL   Final    Gram Stain     Final    Value: ABUNDANT WBC PRESENT,BOTH PMN AND MONONUCLEAR     NO  ORGANISMS SEEN   Culture NO GROWTH 3 DAYS   Final    Report Status 01/08/2012 FINAL   Final   ANAEROBIC CULTURE     Status: Normal   Collection Time   01/04/12  9:46 PM      Component Value Range Status Comment   Specimen Description ABSCESS BACK   Final    Special Requests PATIENT ON FOLLOWING LORTAZ, DIFLUCAN EPIDURAL   Final    Gram Stain     Final    Value: ABUNDANT WBC PRESENT,BOTH PMN AND MONONUCLEAR     NO ORGANISMS SEEN     Performed at [facility]   Culture NO ANAEROBES ISOLATED   Final    Report Status 01/09/2012 FINAL   Final   GRAM STAIN     Status: Normal   Collection Time   01/04/12  9:46 PM      Component Value Range Status Comment   Specimen Description ABSCESS BACK   Final    Special Requests PATIENT ON FOLLOWING LORTAZ, DIFLUCAN EPIDURAL   Final    Gram Stain     Final    Value: ABUNDANT WBC PRESENT,BOTH PMN AND MONONUCLEAR     NO ORGANISMS SEEN   Report Status 01/04/2012 FINAL   Final   CULTURE, ROUTINE-ABSCESS     Status: Normal   Collection Time   01/07/12  4:14 PM      Component Value Range Status Comment   Specimen Description ABSCESS   Final    Special Requests PSOAS MUSCLE   Final    Gram Stain     Final    Value: MODERATE WBC PRESENT, PREDOMINANTLY PMN     NO SQUAMOUS EPITHELIAL CELLS SEEN     NO ORGANISMS SEEN   Culture NO GROWTH 3 DAYS   Final    Report Status 01/11/2012 FINAL   Final   CULTURE, ROUTINE-ABSCESS     Status: Normal   Collection Time   01/07/12  9:03 PM      Component Value Range Status Comment   Specimen Description ABSCESS EYE LEFT   Final    Special Requests INTRAOCULAR  Final    Gram Stain     Final    Value: ABUNDANT WBC PRESENT,BOTH PMN AND MONONUCLEAR     RARE GRAM POSITIVE COCCI IN PAIRS     Performed at [facility]   Culture NO GROWTH 3 DAYS   Final    Report Status 01/11/2012 FINAL   Final   ANAEROBIC CULTURE     Status: Normal   Collection Time   01/07/12  9:03 PM      Component Value Range Status Comment     Specimen Description ABSCESS EYE LEFT   Final    Special Requests INTRAOCULAR   Final    Gram Stain     Final    Value: ABUNDANT WBC PRESENT,BOTH PMN AND MONONUCLEAR     RARE GRAM POSITIVE COCCI IN PAIRS     Performed at [facility]   Culture NO ANAEROBES ISOLATED   Final    Report Status 01/12/2012 FINAL   Final   GRAM STAIN     Status: Normal   Collection Time   01/07/12  9:03 PM      Component Value Range Status Comment   Specimen Description ABSCESS EYE LEFT   Final    Special Requests INTRAOCULAR   Final    Gram Stain     Final    Value: ABUNDANT WBC PRESENT,BOTH PMN AND MONONUCLEAR     RARE GRAM POSITIVE COCCI IN PAIRS   Report Status 01/07/2012 FINAL   Final     Studies/Results: No results found.  Assessment/Plan:  Continue vanc and ceftaz, rash is improving with benadryl. She will need a long course of both antibiotics, 6-8 weeks.  Both are three times per day.  Rash better.    LOS: 14 days    Christopulos, Georgios 01/13/2012, 10:13 AM

## 2012-01-14 ENCOUNTER — Inpatient Hospital Stay (HOSPITAL_COMMUNITY): Payer: Medicaid Other

## 2012-01-14 LAB — BASIC METABOLIC PANEL
Chloride: 103 mEq/L (ref 96–112)
GFR calc Af Amer: >90 mL/min (ref >90)
GFR calc non Af Amer: >90 mL/min (ref >90)
Potassium: 4.1 mEq/L (ref 3.5–5.1)
Sodium: 138 mEq/L (ref 135–145)

## 2012-01-14 LAB — CBC
HCT: 32.1 % — ABNORMAL LOW (ref 36.0–46.0)
Platelets: 592 10*3/uL — ABNORMAL HIGH (ref 150–400)
RDW: 15.2 % (ref 11.5–15.5)
WBC: 7.1 10*3/uL (ref 4.0–10.5)

## 2012-01-14 LAB — VANCOMYCIN, TROUGH: Vancomycin Tr: 19.1 ug/mL (ref 10.0–20.0)

## 2012-01-14 MED ORDER — IOHEXOL 300 MG/ML  SOLN
100.0000 mL | Freq: Once | INTRAMUSCULAR | Status: AC | PRN
  Administered 2012-01-14: 100 mL via INTRAVENOUS

## 2012-01-14 NOTE — Progress Notes (Signed)
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  Allergies  Allergen Reactions  . Sulfur     Patient Measurements: Height: 5\' 9"  (175.3 cm) Weight: 134 lb 7.7 oz (61 kg) IBW/kg (Calculated) : 66.2  Adjusted Body Weight:   Vital Signs: Temp: 98.3 F (36.8 C) (03/15 0530) Temp src: Oral (03/15 0530) BP: 148/81 mmHg (03/15 0530) Pulse Rate: 102  (03/15 0530) Intake/Output from previous day: 03/14 0701 - 03/15 0700 In: 720 [P.O.:720] Out: 2860 [Urine:2850; Drains:10] Intake/Output from this shift:    Labs:  Basename 01/14/12 0500 01/13/12 0530 01/12/12 0500  WBC 7.1 6.0 5.7  HGB 10.5* 10.6* 10.8*  PLT 592* 563* 630*  LABCREA -- -- --  CREATININE 0.56 0.55 0.62   Estimated Creatinine Clearance: 70.2 ml/min (by C-G formula based on Cr of 0.56). No results found for this basename: VANCOTROUGH:2,VANCOPEAK:2,VANCORANDOM:2,GENTTROUGH:2,GENTPEAK:2,GENTRANDOM:2,TOBRATROUGH:2,TOBRAPEAK:2,TOBRARND:2,AMIKACINPEAK:2,AMIKACINTROU:2,AMIKACIN:2, in the last 72 hours   Microbiology: Recent Results (from the past 720 hour(s))  URINE CULTURE     Status: Normal   Collection Time   12/31/11  4:15 AM      Component Value Range Status Comment   Specimen Description URINE, CLEAN CATCH   Final    Special Requests NONE   Final    Culture  Setup Time [id number]   Final    Colony Count NO GROWTH   Final    Culture NO GROWTH   Final    Report Status 01/01/2012 FINAL   Final   CULTURE, BLOOD (ROUTINE X 2)     Status: Normal   Collection Time   12/31/11  8:42 AM      Component Value Range Status Comment   Specimen Description BLOOD LEFT ARM   Final    Special Requests BOTTLES DRAWN AEROBIC ONLY 10CC   Final    Culture  Setup Time [id number]   Final    Culture NO GROWTH 5 DAYS   Final    Report Status 01/06/2012 FINAL   Final   CULTURE, BLOOD (ROUTINE X 2)     Status: Normal   Collection Time   12/31/11  8:48 AM      Component Value Range Status Comment   Specimen Description BLOOD LEFT HAND   Final    Special Requests BOTTLES DRAWN AEROBIC ONLY 10CC   Final    Culture  Setup Time [id number]   Final    Culture NO GROWTH 5 DAYS   Final    Report Status 01/06/2012 FINAL   Final   CULTURE, SPUTUM-ASSESSMENT     Status: Normal   Collection Time   12/31/11  2:15 PM      Component Value Range Status Comment   Specimen Description SPUTUM   Final    Special Requests NONE   Final    Sputum evaluation     Final    Value: THIS SPECIMEN IS ACCEPTABLE. RESPIRATORY CULTURE REPORT TO FOLLOW.   Report Status 12/31/2011 FINAL   Final   CULTURE, RESPIRATORY     Status: Normal   Collection Time   12/31/11  2:15 PM      Component Value Range Status Comment   Specimen Description SPUTUM   Final    Special Requests NONE   Final    Gram Stain     Final    Value: NO WBC SEEN     NO SQUAMOUS EPITHELIAL CELLS SEEN     FEW YEAST  Culture NORMAL OROPHARYNGEAL FLORA   Final    Report Status 01/03/2012 FINAL   Final   STOOL CULTURE     Status: Normal   Collection Time   12/31/11  2:45 PM      Component Value Range Status Comment   Specimen Description STOOL   Final    Special Requests NONE   Final    Culture     Final    Value: NO SALMONELLA, SHIGELLA, CAMPYLOBACTER, OR YERSINIA ISOLATED   Report Status 01/04/2012 FINAL   Final   CLOSTRIDIUM DIFFICILE BY PCR     Status: Normal   Collection Time   01/02/12 11:29 AM      Component Value Range Status Comment   C difficile by pcr NEGATIVE  NEGATIVE  Final   EYE CULTURE     Status: Normal   Collection Time   01/03/12  1:45 PM      Component Value Range Status Comment   Specimen Description EYE LEFT   Final    Special Requests     Final    Value: VTREOUS PT ON VANC CEFTAZ DO SENS TO ALL GROWTH INCLUDING COAG NEG STAPH PER MD   Culture NO GROWTH 7 DAYS   Final    Report Status 01/11/2012 FINAL   Final   EYE CULTURE     Status:  Normal   Collection Time   01/03/12  1:45 PM      Component Value Range Status Comment   Specimen Description EYE LEFT   Final    Special Requests     Final    Value: ANTEROR CHAMBER PT ON VANC CEFTAZ DO SENS TO ALL GROWTH INCLUDING COAG NEG STAPH PER MD   Culture NO GROWTH 7 DAYS   Final    Report Status 01/11/2012 FINAL   Final   CULTURE, BLOOD (ROUTINE X 2)     Status: Normal   Collection Time   01/03/12  8:32 PM      Component Value Range Status Comment   Specimen Description BLOOD RIGHT HAND   Final    Special Requests BOTTLES DRAWN AEROBIC ONLY 10CC   Final    Culture  Setup Time [id number]   Final    Culture NO GROWTH 5 DAYS   Final    Report Status 01/10/2012 FINAL   Final   CULTURE, BLOOD (ROUTINE X 2)     Status: Normal   Collection Time   01/03/12  8:39 PM      Component Value Range Status Comment   Specimen Description BLOOD RIGHT FOREARM   Final    Special Requests BOTTLES DRAWN AEROBIC ONLY 10CC   Final    Culture  Setup Time [id number]   Final    Culture NO GROWTH 5 DAYS   Final    Report Status 01/10/2012 FINAL   Final   CULTURE, SPUTUM-ASSESSMENT     Status: Normal   Collection Time   01/04/12  6:48 AM      Component Value Range Status Comment   Specimen Description SPUTUM   Final    Special Requests NONE   Final    Sputum evaluation     Final    Value: MICROSCOPIC FINDINGS SUGGEST THAT THIS SPECIMEN IS NOT REPRESENTATIVE OF LOWER RESPIRATORY SECRETIONS. PLEASE RECOLLECT.     CALLED TO [facility] RN 01/04/12 0800 COSTELLO B   Report Status 01/04/2012 FINAL   Final   MRSA PCR SCREENING     Status: Normal  Collection Time   01/04/12  4:20 PM      Component Value Range Status Comment   MRSA by PCR NEGATIVE  NEGATIVE  Final   CULTURE, ROUTINE-ABSCESS     Status: Normal   Collection Time   01/04/12  9:46 PM      Component Value Range Status Comment   Specimen Description ABSCESS BACK   Final    Special Requests PATIENT ON FOLLOWING LORTAZ, DIFLUCAN EPIDURAL   Final     Gram Stain     Final    Value: ABUNDANT WBC PRESENT,BOTH PMN AND MONONUCLEAR     NO ORGANISMS SEEN   Culture NO GROWTH 3 DAYS   Final    Report Status 01/08/2012 FINAL   Final   ANAEROBIC CULTURE     Status: Normal   Collection Time   01/04/12  9:46 PM      Component Value Range Status Comment   Specimen Description ABSCESS BACK   Final    Special Requests PATIENT ON FOLLOWING LORTAZ, DIFLUCAN EPIDURAL   Final    Gram Stain     Final    Value: ABUNDANT WBC PRESENT,BOTH PMN AND MONONUCLEAR     NO ORGANISMS SEEN     Performed at [facility]   Culture NO ANAEROBES ISOLATED   Final    Report Status 01/09/2012 FINAL   Final   GRAM STAIN     Status: Normal   Collection Time   01/04/12  9:46 PM      Component Value Range Status Comment   Specimen Description ABSCESS BACK   Final    Special Requests PATIENT ON FOLLOWING LORTAZ, DIFLUCAN EPIDURAL   Final    Gram Stain     Final    Value: ABUNDANT WBC PRESENT,BOTH PMN AND MONONUCLEAR     NO ORGANISMS SEEN   Report Status 01/04/2012 FINAL   Final   CULTURE, ROUTINE-ABSCESS     Status: Normal   Collection Time   01/07/12  4:14 PM      Component Value Range Status Comment   Specimen Description ABSCESS   Final    Special Requests PSOAS MUSCLE   Final    Gram Stain     Final    Value: MODERATE WBC PRESENT, PREDOMINANTLY PMN     NO SQUAMOUS EPITHELIAL CELLS SEEN     NO ORGANISMS SEEN   Culture NO GROWTH 3 DAYS   Final    Report Status 01/11/2012 FINAL   Final   CULTURE, ROUTINE-ABSCESS     Status: Normal   Collection Time   01/07/12  9:03 PM      Component Value Range Status Comment   Specimen Description ABSCESS EYE LEFT   Final    Special Requests INTRAOCULAR   Final    Gram Stain     Final    Value: ABUNDANT WBC PRESENT,BOTH PMN AND MONONUCLEAR     RARE GRAM POSITIVE COCCI IN PAIRS     Performed at [facility]   Culture NO GROWTH 3 DAYS   Final    Report Status 01/11/2012 FINAL   Final   ANAEROBIC CULTURE     Status:  Normal   Collection Time   01/07/12  9:03 PM      Component Value Range Status Comment   Specimen Description ABSCESS EYE LEFT   Final    Special Requests INTRAOCULAR   Final    Gram Stain     Final  Value: ABUNDANT WBC PRESENT,BOTH PMN AND MONONUCLEAR     RARE GRAM POSITIVE COCCI IN PAIRS     Performed at [facility]   Culture NO ANAEROBES ISOLATED   Final    Report Status 01/12/2012 FINAL   Final   GRAM STAIN     Status: Normal   Collection Time   01/07/12  9:03 PM      Component Value Range Status Comment   Specimen Description ABSCESS EYE LEFT   Final    Special Requests INTRAOCULAR   Final    Gram Stain     Final    Value: ABUNDANT WBC PRESENT,BOTH PMN AND MONONUCLEAR     RARE GRAM POSITIVE COCCI IN PAIRS   Report Status 01/07/2012 FINAL   Final     Anti-infectives     Start     Dose/Rate Route Frequency Ordered Stop   01/07/12 2137   ceFAZolin (ANCEF) injection  Status:  Discontinued          As needed 01/07/12 2138 01/07/12 2217   01/07/12 1930   ceFAZolin (ANCEF) 200 mg/mL injection 200 mg  Status:  Discontinued        1 mL Subconjunctival  Once 01/07/12 1924 01/13/12 1026   01/07/12 1930   ceFAZolin (ANCEF) 200 mg/mL injection 200 mg  Status:  Discontinued        200 mg Subconjunctival  Once 01/07/12 1926 01/13/12 1026   01/06/12 0200   vancomycin (VANCOCIN) 750 mg in sodium chloride 0.9 % 150 mL IVPB        750 mg 150 mL/hr over 60 Minutes Intravenous Every 8 hours 01/05/12 1952     01/05/12 1300   cefTAZidime (FORTAZ) ophth injection 100 mg        100 mg Subconjunctival  Once 01/05/12 1247 01/05/12 1443   01/05/12 1300   vancomycin (VANCOCIN) ophth injection 25 mg        25 mg Subconjunctival  Once 01/05/12 1247 01/05/12 1446   01/05/12 1300   vancomycin (VANCOCIN) injection INJ 1 mg        1 mg Subconjunctival  Once 01/05/12 1247 01/05/12 1446   01/05/12 1200   cefTAZidime (FORTAZ) ophth injection 2.25 mg        2.25 mg Intravitreal  Once 01/05/12  1053 01/05/12 1444   01/04/12 2134   50,000 units bacitracin in 0.9% normal saline 250 mL irrigation  Status:  Discontinued          As needed 01/04/12 2145 01/04/12 2247   01/04/12 2100   bacitracin 29562 UNITS injection     Comments: AURAND, BRANDI: cabinet override         01/04/12 2100 01/05/12 0859   01/04/12 2100   ceFAZolin (ANCEF) 1-5 GM-% IVPB     Comments: AURAND, BRANDI: cabinet override         01/04/12 2100 01/05/12 0859   01/04/12 1830   fluconazole (DIFLUCAN) IVPB 400 mg  Status:  Discontinued        400 mg 200 mL/hr over 60 Minutes Intravenous Every 24 hours 01/03/12 1827 01/07/12 1616   01/03/12 1930   fluconazole (DIFLUCAN) IVPB 800 mg        800 mg 400 mL/hr over 60 Minutes Intravenous  Once 01/03/12 1826 01/03/12 2139   01/03/12 1245   cefTAZidime (FORTAZ) ophth injection 2.25 mg  Status:  Discontinued        2.25 mg Intravitreal  Once 01/03/12 1135 01/09/12 0427  01/03/12 1245   cefTAZidime (FORTAZ) ophth injection 100 mg  Status:  Discontinued        100 mg Subconjunctival  Once 01/03/12 1135 01/09/12 0427   01/03/12 1200   vancomycin (VANCOCIN) ophth injection 0.1 mg  Status:  Discontinued        0.1 mg Intravitreal  Once 01/03/12 1130 01/09/12 0427   01/03/12 1200   vancomycin (VANCOCIN) ophth injection 25 mg  Status:  Discontinued        25 mg Subconjunctival  Once 01/03/12 1131 01/09/12 0427   01/03/12 0500   vancomycin (VANCOCIN) 750 mg in sodium chloride 0.9 % 150 mL IVPB  Status:  Discontinued        750 mg 150 mL/hr over 60 Minutes Intravenous Every 12 hours 01/02/12 1716 01/05/12 1953   01/03/12 0200   ceFEPIme (MAXIPIME) 2 g in dextrose 5 % 50 mL IVPB  Status:  Discontinued        2 g 100 mL/hr over 30 Minutes Intravenous Every 8 hours 01/02/12 1716 01/02/12 2041   01/02/12 2200   cefTAZidime (FORTAZ) 1 g in dextrose 5 % 50 mL IVPB        1 g 100 mL/hr over 30 Minutes Intravenous 3 times per day 01/02/12 2106     01/02/12 1400   ceFEPIme  (MAXIPIME) 2 g in dextrose 5 % 50 mL IVPB  Status:  Discontinued        2 g 100 mL/hr over 30 Minutes Intravenous 3 times per day 01/02/12 1318 01/02/12 1716   01/02/12 1400   vancomycin (VANCOCIN) 750 mg in sodium chloride 0.9 % 150 mL IVPB  Status:  Discontinued        750 mg 150 mL/hr over 60 Minutes Intravenous Every 12 hours 01/02/12 1350 01/02/12 1716   12/31/11 1530   valACYclovir (VALTREX) tablet 2,000 mg        2,000 mg Oral 2 times daily 12/31/11 1358 12/31/11 2235   12/31/11 0100   moxifloxacin (AVELOX) IVPB 400 mg  Status:  Discontinued        400 mg 250 mL/hr over 60 Minutes Intravenous Every 24 hours 12/31/11 0040 01/03/12 1913          Assessment: Patient is a 61 y.o F on vancomycin and fortaz day # 13 for 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  Neurosurgery recommends to treat with abx for 3 months.  Patient is afebrile and cultures remains negative.  Last vancomycin trough level obtained on 3/9 was therapeutic at 16.8.   Vancomycin level today now back still therapeutic at 19.1.  Goal of Therapy:  Vancomycin trough level 15-20 mcg/ml  Plan:  1) no change for Fortaz 2) continue current current vancomycin regimen  Reana Chacko P 01/14/2012,10:43 AM

## 2012-01-14 NOTE — Progress Notes (Signed)
4 Days Post-Op  Subjective: No new significant changes  Objective: Vital signs in last 24 hours: Temp:  [98 F (36.7 C)-98.3 F (36.8 C)] 98.3 F (36.8 C) (03/15 0530) Pulse Rate:  [92-102] 102  (03/15 0530) Resp:  [18-20] 20  (03/15 0530) BP: (93-148)/(53-81) 148/81 mmHg (03/15 0530) SpO2:  [96 %-98 %] 96 % (03/15 0530) Weight:  [134 lb 7.7 oz (61 kg)] 134 lb 7.7 oz (61 kg) (03/15 0530) Last BM Date: 01/12/12  Intake/Output from previous day: 03/14 0701 - 03/15 0700 In: 720 [P.O.:720] Out: 2860 [Urine:2850; Drains:10]    Physical exam reveals drain intact.  Has had minimal output last 72 hours - only that consistent with flushing amount daily (10-54ml saline).  Cultures negative from this source.    Lab Results:   Basename 01/14/12 0500 01/13/12 0530  WBC 7.1 6.0  HGB 10.5* 10.6*  HCT 32.1* 31.9*  PLT 592* 563*   BMET  Basename 01/14/12 0500 01/13/12 0530  NA 138 136  K 4.1 3.8  CL 103 100  CO2 29 29  GLUCOSE 91 88  BUN 4* 5*  CREATININE 0.56 0.55  CALCIUM 8.5 8.4    Anti-infectives: Anti-infectives     Start     Dose/Rate Route Frequency Ordered Stop   01/07/12 2137   ceFAZolin (ANCEF) injection  Status:  Discontinued          As needed 01/07/12 2138 01/07/12 2217   01/07/12 1930   ceFAZolin (ANCEF) 200 mg/mL injection 200 mg  Status:  Discontinued        1 mL Subconjunctival  Once 01/07/12 1924 01/13/12 1026   01/07/12 1930   ceFAZolin (ANCEF) 200 mg/mL injection 200 mg  Status:  Discontinued        200 mg Subconjunctival  Once 01/07/12 1926 01/13/12 1026   01/06/12 0200   vancomycin (VANCOCIN) 750 mg in sodium chloride 0.9 % 150 mL IVPB        750 mg 150 mL/hr over 60 Minutes Intravenous Every 8 hours 01/05/12 1952     01/05/12 1300   cefTAZidime (FORTAZ) ophth injection 100 mg        100 mg Subconjunctival  Once 01/05/12 1247 01/05/12 1443   01/05/12 1300   vancomycin (VANCOCIN) ophth injection 25 mg        25 mg Subconjunctival  Once 01/05/12  1247 01/05/12 1446   01/05/12 1300   vancomycin (VANCOCIN) injection INJ 1 mg        1 mg Subconjunctival  Once 01/05/12 1247 01/05/12 1446   01/05/12 1200   cefTAZidime (FORTAZ) ophth injection 2.25 mg        2.25 mg Intravitreal  Once 01/05/12 1053 01/05/12 1444   01/04/12 2134   50,000 units bacitracin in 0.9% normal saline 250 mL irrigation  Status:  Discontinued          As needed 01/04/12 2145 01/04/12 2247   01/04/12 2100   bacitracin 95621 UNITS injection     Comments: AURAND, BRANDI: cabinet override         01/04/12 2100 01/05/12 0859   01/04/12 2100   ceFAZolin (ANCEF) 1-5 GM-% IVPB     Comments: AURAND, BRANDI: cabinet override         01/04/12 2100 01/05/12 0859   01/04/12 1830   fluconazole (DIFLUCAN) IVPB 400 mg  Status:  Discontinued        400 mg 200 mL/hr over 60 Minutes Intravenous Every 24 hours 01/03/12 1827 01/07/12  1616   01/03/12 1930   fluconazole (DIFLUCAN) IVPB 800 mg        800 mg 400 mL/hr over 60 Minutes Intravenous  Once 01/03/12 1826 01/03/12 2139   01/03/12 1245   cefTAZidime (FORTAZ) ophth injection 2.25 mg  Status:  Discontinued        2.25 mg Intravitreal  Once 01/03/12 1135 01/09/12 0427   01/03/12 1245   cefTAZidime (FORTAZ) ophth injection 100 mg  Status:  Discontinued        100 mg Subconjunctival  Once 01/03/12 1135 01/09/12 0427   01/03/12 1200   vancomycin (VANCOCIN) ophth injection 0.1 mg  Status:  Discontinued        0.1 mg Intravitreal  Once 01/03/12 1130 01/09/12 0427   01/03/12 1200   vancomycin (VANCOCIN) ophth injection 25 mg  Status:  Discontinued        25 mg Subconjunctival  Once 01/03/12 1131 01/09/12 0427   01/03/12 0500   vancomycin (VANCOCIN) 750 mg in sodium chloride 0.9 % 150 mL IVPB  Status:  Discontinued        750 mg 150 mL/hr over 60 Minutes Intravenous Every 12 hours 01/02/12 1716 01/05/12 1953   01/03/12 0200   ceFEPIme (MAXIPIME) 2 g in dextrose 5 % 50 mL IVPB  Status:  Discontinued        2 g 100 mL/hr  over 30 Minutes Intravenous Every 8 hours 01/02/12 1716 01/02/12 2041   01/02/12 2200   cefTAZidime (FORTAZ) 1 g in dextrose 5 % 50 mL IVPB        1 g 100 mL/hr over 30 Minutes Intravenous 3 times per day 01/02/12 2106     01/02/12 1400   ceFEPIme (MAXIPIME) 2 g in dextrose 5 % 50 mL IVPB  Status:  Discontinued        2 g 100 mL/hr over 30 Minutes Intravenous 3 times per day 01/02/12 1318 01/02/12 1716   01/02/12 1400   vancomycin (VANCOCIN) 750 mg in sodium chloride 0.9 % 150 mL IVPB  Status:  Discontinued        750 mg 150 mL/hr over 60 Minutes Intravenous Every 12 hours 01/02/12 1350 01/02/12 1716   12/31/11 1530   valACYclovir (VALTREX) tablet 2,000 mg        2,000 mg Oral 2 times daily 12/31/11 1358 12/31/11 2235   12/31/11 0100   moxifloxacin (AVELOX) IVPB 400 mg  Status:  Discontinued        400 mg 250 mL/hr over 60 Minutes Intravenous Every 24 hours 12/31/11 0040 01/03/12 1913          Assessment/Plan:  Left psoas abscess s/p drainage by IR.  No growth from cultures.  Output has been minimal (only that of flushing amount) last 72 hours.  CT ordered for later today to evaluate collection for possible drain removal. Will follow up after CT results finalized.    LOS: 15 days    Donna Tapia 01/14/2012

## 2012-01-14 NOTE — Progress Notes (Signed)
Physical Therapy Treatment Patient Details Name: Donna Tapia MRN: [id number] DOB: Sep 29, 1951 Today's Date: 01/14/2012  PT Assessment/Plan  PT - Assessment/Plan Comments on Treatment Session: Pt compensating well for loss of vision in lt eye with mobility.  Pt also with improved activity tolerance.  Feel pt can return to her apt. from PT standpoint. PT Plan: Discharge plan needs to be updated PT Frequency: Min 3X/week Follow Up Recommendations: Home health PT Equipment Recommended: None by PT PT Goals  Acute Rehab PT Goals PT Goal: Sit to Stand - Progress: Met PT Goal: Stand to Sit - Progress: Met PT Goal: Ambulate - Progress: Progressing toward goal PT Goal: Perform Home Exercise Program - Progress: Progressing toward goal  PT Treatment Precautions/Restrictions  Precautions Precautions: Fall Required Braces or Orthoses: No Restrictions Weight Bearing Restrictions: No Mobility (including Balance) Transfers Sit to Stand: 6: Modified independent (Device/Increase time);From chair/3-in-1;With upper extremity assist;With armrests Sit to Stand Details (indicate cue type and reason): No cues needed. Stand to Sit: 6: Modified independent (Device/Increase time);With upper extremity assist;With armrests;To chair/3-in-1 Stand to Sit Details: No cues needed. Ambulation/Gait: 250 feet Ambulation/Gait Assistance: 5: Supervision Ambulation/Gait Assistance Details (indicate cue type and reason): Verbal cues to slide hands back slightly on walker Assistive device: Rolling walker Gait Pattern: Decreased stride length  Static Standing Balance Static Standing - Balance Support: No upper extremity supported Static Standing - Level of Assistance: 6: Modified independent (Device/Increase time) Dynamic Standing Balance Dynamic Standing - Balance Support: Bilateral upper extremity supported (on walker) Dynamic Standing - Level of Assistance: 5: Stand by assistance Exercise    End of Session PT -  End of Session Equipment Utilized During Treatment: Gait belt Activity Tolerance: Patient tolerated treatment well Patient left: in chair;with call bell in reach Nurse Communication: Mobility status for ambulation General Behavior During Session: Donna [facility] for tasks performed Cognition: Donna Tapia for tasks performed  [facility] 01/14/2012, 10:41 AM  [name] PT [phone]

## 2012-01-14 NOTE — Progress Notes (Signed)
Subjective: Rash essentially unchanged per patient  Objective: Vital signs in last 24 hours: Filed Vitals:   01/13/12 0553 01/13/12 1358 01/13/12 2130 01/14/12 0530  BP: 131/85 93/53 135/71 148/81  Pulse: 81 92 100 102  Temp: 97.9 F (36.6 C) 98.2 F (36.8 C) 98 F (36.7 C) 98.3 F (36.8 C)  TempSrc: Oral Oral Oral Oral  Resp: 18 18 19 20   Height:      Weight: 60.5 kg (133 lb 6.1 oz)   61 kg (134 lb 7.7 oz)  SpO2: 96% 97% 98% 96%   Weight change: 0.5 kg (1 lb 1.6 oz)  Intake/Output Summary (Last 24 hours) at 01/14/12 1229 Last data filed at 01/14/12 0531  Gross per 24 hour  Intake    480 ml  Output   2510 ml  Net  -2030 ml    Physical Exam: General: Awake, Oriented, comforatbale HEENT: L eye removed , covered Neck: Supple CV: S1 and S2, rrr Lungs: clear ant Abdomen: Soft, Nontender, Nondistended, +bowel sounds. Ext: Good pulses. No edema., weakness B/L Neuro:Non Focal  Lab Results:  Basename 01/14/12 0500 01/13/12 0530  NA 138 136  K 4.1 3.8  CL 103 100  CO2 29 29  GLUCOSE 91 88  BUN 4* 5*  CREATININE 0.56 0.55  CALCIUM 8.5 8.4  MG -- --  PHOS -- --   No results found for this basename: AST:2,ALT:2,ALKPHOS:2,BILITOT:2,PROT:2,ALBUMIN:2 in the last 72 hours No results found for this basename: LIPASE:2,AMYLASE:2 in the last 72 hours  Basename 01/14/12 0500 01/13/12 0530  WBC 7.1 6.0  NEUTROABS -- --  HGB 10.5* 10.6*  HCT 32.1* 31.9*  MCV 92.8 92.7  PLT 592* 563*   No results found for this basename: CKTOTAL:3,CKMB:3,CKMBINDEX:3,TROPONINI:3 in the last 72 hours No components found with this basename: POCBNP:3 No results found for this basename: DDIMER:2 in the last 72 hours No results found for this basename: HGBA1C:2 in the last 72 hours No results found for this basename: CHOL:2,HDL:2,LDLCALC:2,TRIG:2,CHOLHDL:2,LDLDIRECT:2 in the last 72 hours No results found for this basename: TSH,T4TOTAL,FREET3,T3FREE,THYROIDAB in the last 72 hours No results  found for this basename: VITAMINB12:2,FOLATE:2,FERRITIN:2,TIBC:2,IRON:2,RETICCTPCT:2 in the last 72 hours  Micro Results: Recent Results (from the past 240 hour(s))  MRSA PCR SCREENING     Status: Normal   Collection Time   01/04/12  4:20 PM      Component Value Range Status Comment   MRSA by PCR NEGATIVE  NEGATIVE  Final   CULTURE, ROUTINE-ABSCESS     Status: Normal   Collection Time   01/04/12  9:46 PM      Component Value Range Status Comment   Specimen Description ABSCESS BACK   Final    Special Requests PATIENT ON FOLLOWING LORTAZ, DIFLUCAN EPIDURAL   Final    Gram Stain     Final    Value: ABUNDANT WBC PRESENT,BOTH PMN AND MONONUCLEAR     NO ORGANISMS SEEN   Culture NO GROWTH 3 DAYS   Final    Report Status 01/08/2012 FINAL   Final   ANAEROBIC CULTURE     Status: Normal   Collection Time   01/04/12  9:46 PM      Component Value Range Status Comment   Specimen Description ABSCESS BACK   Final    Special Requests PATIENT ON FOLLOWING LORTAZ, DIFLUCAN EPIDURAL   Final    Gram Stain     Final    Value: ABUNDANT WBC PRESENT,BOTH PMN AND MONONUCLEAR     NO ORGANISMS  SEEN     Performed at [facility]   Culture NO ANAEROBES ISOLATED   Final    Report Status 01/09/2012 FINAL   Final   GRAM STAIN     Status: Normal   Collection Time   01/04/12  9:46 PM      Component Value Range Status Comment   Specimen Description ABSCESS BACK   Final    Special Requests PATIENT ON FOLLOWING LORTAZ, DIFLUCAN EPIDURAL   Final    Gram Stain     Final    Value: ABUNDANT WBC PRESENT,BOTH PMN AND MONONUCLEAR     NO ORGANISMS SEEN   Report Status 01/04/2012 FINAL   Final   CULTURE, ROUTINE-ABSCESS     Status: Normal   Collection Time   01/07/12  4:14 PM      Component Value Range Status Comment   Specimen Description ABSCESS   Final    Special Requests PSOAS MUSCLE   Final    Gram Stain     Final    Value: MODERATE WBC PRESENT, PREDOMINANTLY PMN     NO SQUAMOUS EPITHELIAL CELLS SEEN     NO  ORGANISMS SEEN   Culture NO GROWTH 3 DAYS   Final    Report Status 01/11/2012 FINAL   Final   CULTURE, ROUTINE-ABSCESS     Status: Normal   Collection Time   01/07/12  9:03 PM      Component Value Range Status Comment   Specimen Description ABSCESS EYE LEFT   Final    Special Requests INTRAOCULAR   Final    Gram Stain     Final    Value: ABUNDANT WBC PRESENT,BOTH PMN AND MONONUCLEAR     RARE GRAM POSITIVE COCCI IN PAIRS     Performed at [facility]   Culture NO GROWTH 3 DAYS   Final    Report Status 01/11/2012 FINAL   Final   ANAEROBIC CULTURE     Status: Normal   Collection Time   01/07/12  9:03 PM      Component Value Range Status Comment   Specimen Description ABSCESS EYE LEFT   Final    Special Requests INTRAOCULAR   Final    Gram Stain     Final    Value: ABUNDANT WBC PRESENT,BOTH PMN AND MONONUCLEAR     RARE GRAM POSITIVE COCCI IN PAIRS     Performed at [facility]   Culture NO ANAEROBES ISOLATED   Final    Report Status 01/12/2012 FINAL   Final   GRAM STAIN     Status: Normal   Collection Time   01/07/12  9:03 PM      Component Value Range Status Comment   Specimen Description ABSCESS EYE LEFT   Final    Special Requests INTRAOCULAR   Final    Gram Stain     Final    Value: ABUNDANT WBC PRESENT,BOTH PMN AND MONONUCLEAR     RARE GRAM POSITIVE COCCI IN PAIRS   Report Status 01/07/2012 FINAL   Final     Studies/Results: No results found.  Medications: I have reviewed the patient's current medications. Scheduled Meds:    . artificial tears   Left Eye Q6H WA  . bacitracin-polymyxin b   Left Eye Q6H WA  . cefTAZidime (FORTAZ)  IV  1 g Intravenous Q8H  . enoxaparin (LOVENOX) injection  40 mg Subcutaneous Q24H  . feeding supplement  237 mL Oral BID  . pantoprazole  40 mg Oral BID AC  . polyethylene glycol  17 g Oral Daily  . sodium chloride  3 mL Intravenous Q12H  . vancomycin  750 mg Intravenous Q8H   Continuous Infusions:   PRN Meds:.acetaminophen,  acetaminophen, alum & mag hydroxide-simeth, diphenhydrAMINE, HYDROcodone-acetaminophen, hydrocortisone cream, HYDROmorphone (DILAUDID) injection, iohexol, ondansetron (ZOFRAN) IV, ondansetron  Assessment/Plan: Interim note  54 female with chr low back pain and hx of hep B presented with 2 days hx of painless left eye blindness with hx of nausea. Vomiting and diarrhea 1 week back with findings of left anterior uveitis and multilobar left sided pneumonia on CT scan.  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  evident on MRI from 3/5 and likely the source of infection  Drain placed by IR- fell out on 3/7  drain placed in L psoas muscle on 3/8, IR continues to follow Cont empiric abx -ID will need to clarify antibiotic duration Await CT Scan done today to assess drainage  Left sided multilobar PNA  Secondary to infection- on abx  Anemia  Iron deficiency noted on iron panel  b12 and folate wnl  Stool for occult blood negative  Will monitor closely, hold off on trasnfusion For now until acute issues  resolve  GI eval as out patient once acute issue resolved   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   Hypokalemia resolved  Diarrhea  Prior to admission  c diff negative  resolved   DVT prophylaxis: lovenox SCD   Full code   Disposition -likely HHP/RN vs SNF    LOS: 15 days  Blayne Frankie, DO 01/14/2012, 12:29 PM

## 2012-01-15 MED ORDER — SODIUM CHLORIDE 0.9 % IJ SOLN: 10.0000 mL | INTRAMUSCULAR | Status: DC | PRN

## 2012-01-15 NOTE — Plan of Care (Signed)
Problem: Phase II Progression Outcomes Goal: Discharge plan established Outcome: Completed/Met Date Met:  01/15/12 Pt to discharge home with IV antibiotics, Picc line placed today

## 2012-01-15 NOTE — Progress Notes (Signed)
Subjective: No major events, rash continues to fade slowly Objective: Vital signs in last 24 hours: Filed Vitals:   01/14/12 0530 01/14/12 1300 01/14/12 2226 01/15/12 0527  BP: 148/81 112/73 146/71 111/71  Pulse: 102 91 85 91  Temp: 98.3 F (36.8 C) 97.3 F (36.3 C) 98.1 F (36.7 C) 98 F (36.7 C)  TempSrc: Oral Oral Oral Oral  Resp: 20 18 19 20   Height:      Weight: 61 kg (134 lb 7.7 oz)   59.7 kg (131 lb 9.8 oz)  SpO2: 96% 99% 92% 98%   Weight change: -1.3 kg (-2 lb 13.9 oz)  Intake/Output Summary (Last 24 hours) at 01/15/12 1511 Last data filed at 01/15/12 0900  Gross per 24 hour  Intake    600 ml  Output  824.5 ml  Net -224.5 ml    Physical Exam: General: Awake, Oriented, comforatbale HEENT: L eye removed , covered Neck: Supple CV: S1 and S2, rrr Lungs: clear ant Abdomen: Soft, Nontender, Nondistended, +bowel sounds. Ext: Good pulses. No edema., weakness B/L Neuro:Non Focal  Lab Results:  Basename 01/14/12 0500 01/13/12 0530  NA 138 136  K 4.1 3.8  CL 103 100  CO2 29 29  GLUCOSE 91 88  BUN 4* 5*  CREATININE 0.56 0.55  CALCIUM 8.5 8.4  MG -- --  PHOS -- --   No results found for this basename: AST:2,ALT:2,ALKPHOS:2,BILITOT:2,PROT:2,ALBUMIN:2 in the last 72 hours No results found for this basename: LIPASE:2,AMYLASE:2 in the last 72 hours  Basename 01/14/12 0500 01/13/12 0530  WBC 7.1 6.0  NEUTROABS -- --  HGB 10.5* 10.6*  HCT 32.1* 31.9*  MCV 92.8 92.7  PLT 592* 563*   No results found for this basename: CKTOTAL:3,CKMB:3,CKMBINDEX:3,TROPONINI:3 in the last 72 hours No components found with this basename: POCBNP:3 No results found for this basename: DDIMER:2 in the last 72 hours No results found for this basename: HGBA1C:2 in the last 72 hours No results found for this basename: CHOL:2,HDL:2,LDLCALC:2,TRIG:2,CHOLHDL:2,LDLDIRECT:2 in the last 72 hours No results found for this basename: TSH,T4TOTAL,FREET3,T3FREE,THYROIDAB in the last 72 hours No  results found for this basename: VITAMINB12:2,FOLATE:2,FERRITIN:2,TIBC:2,IRON:2,RETICCTPCT:2 in the last 72 hours  Micro Results: Recent Results (from the past 240 hour(s))  CULTURE, ROUTINE-ABSCESS     Status: Normal   Collection Time   01/07/12  4:14 PM      Component Value Range Status Comment   Specimen Description ABSCESS   Final    Special Requests PSOAS MUSCLE   Final    Gram Stain     Final    Value: MODERATE WBC PRESENT, PREDOMINANTLY PMN     NO SQUAMOUS EPITHELIAL CELLS SEEN     NO ORGANISMS SEEN   Culture NO GROWTH 3 DAYS   Final    Report Status 01/11/2012 FINAL   Final   CULTURE, ROUTINE-ABSCESS     Status: Normal   Collection Time   01/07/12  9:03 PM      Component Value Range Status Comment   Specimen Description ABSCESS EYE LEFT   Final    Special Requests INTRAOCULAR   Final    Gram Stain     Final    Value: ABUNDANT WBC PRESENT,BOTH PMN AND MONONUCLEAR     RARE GRAM POSITIVE COCCI IN PAIRS     Performed at [facility]   Culture NO GROWTH 3 DAYS   Final    Report Status 01/11/2012 FINAL   Final   ANAEROBIC CULTURE     Status:  Normal   Collection Time   01/07/12  9:03 PM      Component Value Range Status Comment   Specimen Description ABSCESS EYE LEFT   Final    Special Requests INTRAOCULAR   Final    Gram Stain     Final    Value: ABUNDANT WBC PRESENT,BOTH PMN AND MONONUCLEAR     RARE GRAM POSITIVE COCCI IN PAIRS     Performed at [facility]   Culture NO ANAEROBES ISOLATED   Final    Report Status 01/12/2012 FINAL   Final   GRAM STAIN     Status: Normal   Collection Time   01/07/12  9:03 PM      Component Value Range Status Comment   Specimen Description ABSCESS EYE LEFT   Final    Special Requests INTRAOCULAR   Final    Gram Stain     Final    Value: ABUNDANT WBC PRESENT,BOTH PMN AND MONONUCLEAR     RARE GRAM POSITIVE COCCI IN PAIRS   Report Status 01/07/2012 FINAL   Final     Studies/Results: Ct Abdomen Pelvis W Contrast  01/14/2012   *RADIOLOGY REPORT*  Clinical Data: Follow up of left-sided psoas abscess.  Status post drain placement 1 week ago.  CT ABDOMEN AND PELVIS WITH CONTRAST  Technique:  Multidetector CT imaging of the abdomen and pelvis was performed following the standard protocol during bolus administration of intravenous contrast.  Contrast: OMNIPAQUE IOHEXOL 300 MG/ML IJ SOLN  Comparison: 01/05/2012  Findings: Minimal dependent left base atelectasis.  Normal heart size.  Mild volume loss in the lingula.  Small left pleural effusion is decreased.  Suspect minimal complex left sided pleural component versus an area of atelectasis on the first image of the study.  The right pleural effusion is resolved.  Normal heart size.  Focal steatosis adjacent the falciform ligament.  Normal spleen. Underdistended distal stomach.  Normal pancreas, gallbladder, biliary tract, adrenal glands, kidneys.No retroperitoneal or retrocrural adenopathy.  The left-sided psoas drain remains in place.  The left-sided psoas abscess/fluid collection is resolved.  Mild residual intramuscular edema.  The cecum is positioned within the pelvis.  Bowel loops are otherwise within normal limits. No ascites.  No pneumatosis or free intraperitoneal air.  No pelvic adenopathy.  Pelvic floor laxity. Normal urinary bladder.  Hysterectomy.  No adnexal mass.  No significant free fluid.  Gas in the subcutaneous anterior abdomen may be iatrogenic.  Lumbosacral laminectomy.  Persistent gas along the anterior aspect of the thecal space on image 39, likely postoperative.  The spine drain has been removed.  There is edema and ill-defined fluid without well-defined fluid collection about the lumbosacral operative site.  Convex right lumbar spine curvature.  IMPRESSION:  1.  Placement of a left psoas drain, with resolution of fluid collection/abscess. 2.  Improved appearance of the inferior chest.  Residual small left pleural effusion.  Minimal loculated complex fluid versus  atelectasis, incompletely imaged. 3.  Ill-defined postoperative fluid about the lumbosacral spine. Removal of surgical drain without evidence of well-defined paraspinal fluid collection.  Original Report Authenticated By: Consuello Bossier, M.D.    Medications: I have reviewed the patient's current medications. Scheduled Meds:    . artificial tears   Left Eye Q6H WA  . bacitracin-polymyxin b   Left Eye Q6H WA  . cefTAZidime (FORTAZ)  IV  1 g Intravenous Q8H  . enoxaparin (LOVENOX) injection  40 mg Subcutaneous Q24H  . feeding supplement  237  mL Oral BID  . pantoprazole  40 mg Oral BID AC  . polyethylene glycol  17 g Oral Daily  . sodium chloride  3 mL Intravenous Q12H  . vancomycin  750 mg Intravenous Q8H   Continuous Infusions:   PRN Meds:.acetaminophen, acetaminophen, alum & mag hydroxide-simeth, diphenhydrAMINE, HYDROcodone-acetaminophen, hydrocortisone cream, HYDROmorphone (DILAUDID) injection, ondansetron (ZOFRAN) IV, ondansetron, sodium chloride  Assessment/Plan: Interim note  51 female with chr low back pain and hx of hep B presented with 2 days hx of painless left eye blindness with hx of nausea. Vomiting and diarrhea 1 week back with findings of left anterior uveitis and multilobar left sided pneumonia on CT scan.  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  evident on MRI from 3/5 and likely the source of infection  Drain placed by IR- fell out on 3/7  drain placed in L psoas muscle on 3/8, IR continues to follow-Drain removed 3/16-after CT Abd showed significant improvement Cont empiric abx -ID will need to clarify antibiotic duration  Left sided multilobar PNA  Secondary to infection- on abx  Anemia  Iron deficiency noted on iron panel  b12 and folate wnl  Stool for occult blood negative  Will monitor closely, hold off on trasnfusion For now until acute issues resolve  GI eval as out patient once acute issue resolved   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   Hypokalemia resolved  Diarrhea  Prior to admission  c diff negative  resolved   DVT prophylaxis: lovenox SCD   Full code   Disposition -likely [facility]HP/RN vs SNF    LOS: 16 days  Donna Fretz, DO 01/15/2012, 3:11 PM

## 2012-01-15 NOTE — Progress Notes (Signed)
5 Days Post-Op  Subjective: Doing well. Good spirits overall despite all she has been through.  C/O little worsening pain and edema over left AC joint area.   Objective: Vital signs in last 24 hours: Temp:  [97.3 F (36.3 C)-98.1 F (36.7 C)] 98 F (36.7 C) (03/16 0527) Pulse Rate:  [85-91] 91  (03/16 0527) Resp:  [18-20] 20  (03/16 0527) BP: (111-146)/(71-73) 111/71 mmHg (03/16 0527) SpO2:  [92 %-99 %] 98 % (03/16 0527) Weight:  [131 lb 9.8 oz (59.7 kg)] 131 lb 9.8 oz (59.7 kg) (03/16 0527) Last BM Date: 01/12/12  Intake/Output from previous day: 03/15 0701 - 03/16 0700 In: 598 [P.O.:598] Out: 1124.5 [Urine:1100; Drains:24.5]  Physical exam :  Drain intact with limited output last 72 hours -, no fever since 01/12/12 and WBC WNL. Drain flushed and aspirated with only serous return.  Drain removed given no real output and CT reveals resolution. Drain removed at bedside without difficulty.   Insicion site examined as well prior to redressing. Steri-strips intact, no bleeding erythema, hematoma or evidence of infection around this site. Included this site in redressing.  Noted the edema, erythema and warmth over the left shoulder near [facility] joint - ? Infectious.  Patient states has had aspirated before in past many years ago- may need to image to determine if fluid filled and possibly aspirate to make sure if infectious- being covered with existing antibiotics being given. Ultrasound should be easiest for patient to tolerate.   Lab Results:   Basename 01/14/12 0500 01/13/12 0530  WBC 7.1 6.0  HGB 10.5* 10.6*  HCT 32.1* 31.9*  PLT 592* 563*   BMET  Basename 01/14/12 0500 01/13/12 0530  NA 138 136  K 4.1 3.8  CL 103 100  CO2 29 29  GLUCOSE 91 88  BUN 4* 5*  CREATININE 0.56 0.55  CALCIUM 8.5 8.4    Studies/Results: Ct Abdomen Pelvis W Contrast  01/14/2012  *RADIOLOGY REPORT*  Clinical Data: Follow up of left-sided psoas abscess.  Status post drain placement 1 week ago.  CT  ABDOMEN AND PELVIS WITH CONTRAST  Technique:  Multidetector CT imaging of the abdomen and pelvis was performed following the standard protocol during bolus administration of intravenous contrast.  Contrast: OMNIPAQUE IOHEXOL 300 MG/ML IJ SOLN  Comparison: 01/05/2012  Findings: Minimal dependent left base atelectasis.  Normal heart size.  Mild volume loss in the lingula.  Small left pleural effusion is decreased.  Suspect minimal complex left sided pleural component versus an area of atelectasis on the first image of the study.  The right pleural effusion is resolved.  Normal heart size.  Focal steatosis adjacent the falciform ligament.  Normal spleen. Underdistended distal stomach.  Normal pancreas, gallbladder, biliary tract, adrenal glands, kidneys.No retroperitoneal or retrocrural adenopathy.  The left-sided psoas drain remains in place.  The left-sided psoas abscess/fluid collection is resolved.  Mild residual intramuscular edema.  The cecum is positioned within the pelvis.  Bowel loops are otherwise within normal limits. No ascites.  No pneumatosis or free intraperitoneal air.  No pelvic adenopathy.  Pelvic floor laxity. Normal urinary bladder.  Hysterectomy.  No adnexal mass.  No significant free fluid.  Gas in the subcutaneous anterior abdomen may be iatrogenic.  Lumbosacral laminectomy.  Persistent gas along the anterior aspect of the thecal space on image 39, likely postoperative.  The spine drain has been removed.  There is edema and ill-defined fluid without well-defined fluid collection about the lumbosacral operative site.  Convex  right lumbar spine curvature.  IMPRESSION:  1.  Placement of a left psoas drain, with resolution of fluid collection/abscess. 2.  Improved appearance of the inferior chest.  Residual small left pleural effusion.  Minimal loculated complex fluid versus atelectasis, incompletely imaged. 3.  Ill-defined postoperative fluid about the lumbosacral spine. Removal of surgical  drain without evidence of well-defined paraspinal fluid collection.  Original Report Authenticated By: Consuello Bossier, M.D.    Anti-infectives: Anti-infectives     Start     Dose/Rate Route Frequency Ordered Stop   01/07/12 2137   ceFAZolin (ANCEF) injection  Status:  Discontinued          As needed 01/07/12 2138 01/07/12 2217   01/07/12 1930   ceFAZolin (ANCEF) 200 mg/mL injection 200 mg  Status:  Discontinued        1 mL Subconjunctival  Once 01/07/12 1924 01/13/12 1026   01/07/12 1930   ceFAZolin (ANCEF) 200 mg/mL injection 200 mg  Status:  Discontinued        200 mg Subconjunctival  Once 01/07/12 1926 01/13/12 1026   01/06/12 0200   vancomycin (VANCOCIN) 750 mg in sodium chloride 0.9 % 150 mL IVPB        750 mg 150 mL/hr over 60 Minutes Intravenous Every 8 hours 01/05/12 1952     01/05/12 1300   cefTAZidime (FORTAZ) ophth injection 100 mg        100 mg Subconjunctival  Once 01/05/12 1247 01/05/12 1443   01/05/12 1300   vancomycin (VANCOCIN) ophth injection 25 mg        25 mg Subconjunctival  Once 01/05/12 1247 01/05/12 1446   01/05/12 1300   vancomycin (VANCOCIN) injection INJ 1 mg        1 mg Subconjunctival  Once 01/05/12 1247 01/05/12 1446   01/05/12 1200   cefTAZidime (FORTAZ) ophth injection 2.25 mg        2.25 mg Intravitreal  Once 01/05/12 1053 01/05/12 1444   01/04/12 2134   50,000 units bacitracin in 0.9% normal saline 250 mL irrigation  Status:  Discontinued          As needed 01/04/12 2145 01/04/12 2247   01/04/12 2100   bacitracin 16109 UNITS injection     Comments: AURAND, BRANDI: cabinet override         01/04/12 2100 01/05/12 0859   01/04/12 2100   ceFAZolin (ANCEF) 1-5 GM-% IVPB     Comments: AURAND, BRANDI: cabinet override         01/04/12 2100 01/05/12 0859   01/04/12 1830   fluconazole (DIFLUCAN) IVPB 400 mg  Status:  Discontinued        400 mg 200 mL/hr over 60 Minutes Intravenous Every 24 hours 01/03/12 1827 01/07/12 1616   01/03/12 1930    fluconazole (DIFLUCAN) IVPB 800 mg        800 mg 400 mL/hr over 60 Minutes Intravenous  Once 01/03/12 1826 01/03/12 2139   01/03/12 1245   cefTAZidime (FORTAZ) ophth injection 2.25 mg  Status:  Discontinued        2.25 mg Intravitreal  Once 01/03/12 1135 01/09/12 0427   01/03/12 1245   cefTAZidime (FORTAZ) ophth injection 100 mg  Status:  Discontinued        100 mg Subconjunctival  Once 01/03/12 1135 01/09/12 0427   01/03/12 1200   vancomycin (VANCOCIN) ophth injection 0.1 mg  Status:  Discontinued        0.1 mg Intravitreal  Once 01/03/12 1130  01/09/12 0427   01/03/12 1200   vancomycin (VANCOCIN) ophth injection 25 mg  Status:  Discontinued        25 mg Subconjunctival  Once 01/03/12 1131 01/09/12 0427   01/03/12 0500   vancomycin (VANCOCIN) 750 mg in sodium chloride 0.9 % 150 mL IVPB  Status:  Discontinued        750 mg 150 mL/hr over 60 Minutes Intravenous Every 12 hours 01/02/12 1716 01/05/12 1953   01/03/12 0200   ceFEPIme (MAXIPIME) 2 g in dextrose 5 % 50 mL IVPB  Status:  Discontinued        2 g 100 mL/hr over 30 Minutes Intravenous Every 8 hours 01/02/12 1716 01/02/12 2041   01/02/12 2200   cefTAZidime (FORTAZ) 1 g in dextrose 5 % 50 mL IVPB        1 g 100 mL/hr over 30 Minutes Intravenous 3 times per day 01/02/12 2106     01/02/12 1400   ceFEPIme (MAXIPIME) 2 g in dextrose 5 % 50 mL IVPB  Status:  Discontinued        2 g 100 mL/hr over 30 Minutes Intravenous 3 times per day 01/02/12 1318 01/02/12 1716   01/02/12 1400   vancomycin (VANCOCIN) 750 mg in sodium chloride 0.9 % 150 mL IVPB  Status:  Discontinued        750 mg 150 mL/hr over 60 Minutes Intravenous Every 12 hours 01/02/12 1350 01/02/12 1716   12/31/11 1530   valACYclovir (VALTREX) tablet 2,000 mg        2,000 mg Oral 2 times daily 12/31/11 1358 12/31/11 2235   12/31/11 0100   moxifloxacin (AVELOX) IVPB 400 mg  Status:  Discontinued        400 mg 250 mL/hr over 60 Minutes Intravenous Every 24 hours 12/31/11  0040 01/03/12 1913          Assessment/Plan:  Psoas collection s/p drainage with resolution of same per CT and per clinical information over last 72 hours.  Drain removed.  Glad we could assist in care  with this very nice patient.   LOS: 16 days    , D 01/15/2012

## 2012-01-16 MED ORDER — SODIUM CHLORIDE 0.9 % IV SOLN: INTRAVENOUS | Status: DC

## 2012-01-16 NOTE — Plan of Care (Signed)
Problem: Phase III Progression Outcomes Goal: Discharge plan remains appropriate-arrangements made Outcome: Completed/Met Date Met:  01/16/12 SNF vs. HH

## 2012-01-16 NOTE — Progress Notes (Addendum)
Subjective: No major events, rash continues to fade slowly-claims to getting stronger-very anxious to go home rather than SNF  Objective: Vital signs in last 24 hours: Filed Vitals:   01/15/12 1400 01/15/12 2255 01/16/12 0500 01/16/12 0622  BP: 98/61 127/70  106/70  Pulse: 99 96  87  Temp: 98.6 F (37 C) 97.6 F (36.4 C)  98.1 F (36.7 C)  TempSrc: Oral Oral  Oral  Resp: 19 18  18   Height:      Weight:   61.4 kg (135 lb 5.8 oz)   SpO2: 94% 97%  95%   Weight change: 1.7 kg (3 lb 12 oz)  Intake/Output Summary (Last 24 hours) at 01/16/12 1124 Last data filed at 01/16/12 0600  Gross per 24 hour  Intake   1140 ml  Output   1050 ml  Net     90 ml    Physical Exam: General: Awake, Oriented, comforatbale HEENT: L eye removed , covered Neck: Supple CV: S1 and S2, rrr Lungs: clear ant Abdomen: Soft, Nontender, Nondistended, +bowel sounds. Ext: Good pulses. No edema., weakness B/L Neuro:Non Focal  Lab Results:  Basename 01/14/12 0500  NA 138  K 4.1  CL 103  CO2 29  GLUCOSE 91  BUN 4*  CREATININE 0.56  CALCIUM 8.5  MG --  PHOS --   No results found for this basename: AST:2,ALT:2,ALKPHOS:2,BILITOT:2,PROT:2,ALBUMIN:2 in the last 72 hours No results found for this basename: LIPASE:2,AMYLASE:2 in the last 72 hours  Basename 01/14/12 0500  WBC 7.1  NEUTROABS --  HGB 10.5*  HCT 32.1*  MCV 92.8  PLT 592*   No results found for this basename: CKTOTAL:3,CKMB:3,CKMBINDEX:3,TROPONINI:3 in the last 72 hours No components found with this basename: POCBNP:3 No results found for this basename: DDIMER:2 in the last 72 hours No results found for this basename: HGBA1C:2 in the last 72 hours No results found for this basename: CHOL:2,HDL:2,LDLCALC:2,TRIG:2,CHOLHDL:2,LDLDIRECT:2 in the last 72 hours No results found for this basename: TSH,T4TOTAL,FREET3,T3FREE,THYROIDAB in the last 72 hours No results found for this basename:  VITAMINB12:2,FOLATE:2,FERRITIN:2,TIBC:2,IRON:2,RETICCTPCT:2 in the last 72 hours  Micro Results: Recent Results (from the past 240 hour(s))  CULTURE, ROUTINE-ABSCESS     Status: Normal   Collection Time   01/07/12  4:14 PM      Component Value Range Status Comment   Specimen Description ABSCESS   Final    Special Requests PSOAS MUSCLE   Final    Gram Stain     Final    Value: MODERATE WBC PRESENT, PREDOMINANTLY PMN     NO SQUAMOUS EPITHELIAL CELLS SEEN     NO ORGANISMS SEEN   Culture NO GROWTH 3 DAYS   Final    Report Status 01/11/2012 FINAL   Final   CULTURE, ROUTINE-ABSCESS     Status: Normal   Collection Time   01/07/12  9:03 PM      Component Value Range Status Comment   Specimen Description ABSCESS EYE LEFT   Final    Special Requests INTRAOCULAR   Final    Gram Stain     Final    Value: ABUNDANT WBC PRESENT,BOTH PMN AND MONONUCLEAR     RARE GRAM POSITIVE COCCI IN PAIRS     Performed at [facility]   Culture NO GROWTH 3 DAYS   Final    Report Status 01/11/2012 FINAL   Final   ANAEROBIC CULTURE     Status: Normal   Collection Time   01/07/12  9:03 PM  Component Value Range Status Comment   Specimen Description ABSCESS EYE LEFT   Final    Special Requests INTRAOCULAR   Final    Gram Stain     Final    Value: ABUNDANT WBC PRESENT,BOTH PMN AND MONONUCLEAR     RARE GRAM POSITIVE COCCI IN PAIRS     Performed at [facility]   Culture NO ANAEROBES ISOLATED   Final    Report Status 01/12/2012 FINAL   Final   GRAM STAIN     Status: Normal   Collection Time   01/07/12  9:03 PM      Component Value Range Status Comment   Specimen Description ABSCESS EYE LEFT   Final    Special Requests INTRAOCULAR   Final    Gram Stain     Final    Value: ABUNDANT WBC PRESENT,BOTH PMN AND MONONUCLEAR     RARE GRAM POSITIVE COCCI IN PAIRS   Report Status 01/07/2012 FINAL   Final     Studies/Results: Ct Abdomen Pelvis W Contrast  01/14/2012  *RADIOLOGY REPORT*  Clinical Data:  Follow up of left-sided psoas abscess.  Status post drain placement 1 week ago.  CT ABDOMEN AND PELVIS WITH CONTRAST  Technique:  Multidetector CT imaging of the abdomen and pelvis was performed following the standard protocol during bolus administration of intravenous contrast.  Contrast: OMNIPAQUE IOHEXOL 300 MG/ML IJ SOLN  Comparison: 01/05/2012  Findings: Minimal dependent left base atelectasis.  Normal heart size.  Mild volume loss in the lingula.  Small left pleural effusion is decreased.  Suspect minimal complex left sided pleural component versus an area of atelectasis on the first image of the study.  The right pleural effusion is resolved.  Normal heart size.  Focal steatosis adjacent the falciform ligament.  Normal spleen. Underdistended distal stomach.  Normal pancreas, gallbladder, biliary tract, adrenal glands, kidneys.No retroperitoneal or retrocrural adenopathy.  The left-sided psoas drain remains in place.  The left-sided psoas abscess/fluid collection is resolved.  Mild residual intramuscular edema.  The cecum is positioned within the pelvis.  Bowel loops are otherwise within normal limits. No ascites.  No pneumatosis or free intraperitoneal air.  No pelvic adenopathy.  Pelvic floor laxity. Normal urinary bladder.  Hysterectomy.  No adnexal mass.  No significant free fluid.  Gas in the subcutaneous anterior abdomen may be iatrogenic.  Lumbosacral laminectomy.  Persistent gas along the anterior aspect of the thecal space on image 39, likely postoperative.  The spine drain has been removed.  There is edema and ill-defined fluid without well-defined fluid collection about the lumbosacral operative site.  Convex right lumbar spine curvature.  IMPRESSION:  1.  Placement of a left psoas drain, with resolution of fluid collection/abscess. 2.  Improved appearance of the inferior chest.  Residual small left pleural effusion.  Minimal loculated complex fluid versus atelectasis, incompletely imaged. 3.   Ill-defined postoperative fluid about the lumbosacral spine. Removal of surgical drain without evidence of well-defined paraspinal fluid collection.  Original Report Authenticated By: Consuello Bossier, M.D.    Medications: I have reviewed the patient's current medications. Scheduled Meds:    . artificial tears   Left Eye Q6H WA  . bacitracin-polymyxin b   Left Eye Q6H WA  . cefTAZidime (FORTAZ)  IV  1 g Intravenous Q8H  . enoxaparin (LOVENOX) injection  40 mg Subcutaneous Q24H  . feeding supplement  237 mL Oral BID  . pantoprazole  40 mg Oral BID AC  . polyethylene glycol  17 g Oral Daily  . vancomycin  750 mg Intravenous Q8H  . DISCONTD: sodium chloride  3 mL Intravenous Q12H   Continuous Infusions:   PRN Meds:.acetaminophen, acetaminophen, alum & mag hydroxide-simeth, diphenhydrAMINE, HYDROcodone-acetaminophen, hydrocortisone cream, HYDROmorphone (DILAUDID) injection, ondansetron (ZOFRAN) IV, ondansetron, sodium chloride  Assessment/Plan: Interim note  57 female with chr low back pain and hx of hep B presented with 2 days hx of painless left eye blindness with hx of nausea. Vomiting and diarrhea 1 week back with findings of left anterior uveitis and multilobar left sided pneumonia on CT scan.  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  Patient had MRI of her cervical thoracic and LS spine done on 3/5 showing epidural abscess over L4-S2 with left psoas abscess s/p drain placement in  Spine and psoas abscess NGTD on any cultures- plan per ID TEE negatvie   PLAN:  Patient continues to improve, now no drains in place.Will need follow up from ID/Opthalmology-tomorrow-for duration of antibiotic therapy and eye care and follow up issues  Rash Better today ?etiology-?antibiotic related, seems stable/unchanged  Continue with monitoring while on antibiotics  Leukocytosis Improving on antibiotics Resolved 3/9  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  evident on MRI from 3/5 and likely the source of infection  Drain placed by IR- fell out on 3/7  drain placed in L psoas muscle on 3/8, IR continues to follow-Drain removed 3/16-after CT Abd showed significant improvement Cont empiric abx -ID will need to clarify antibiotic duration  Left sided multilobar PNA  Secondary to infection- on abx  Anemia  Iron deficiency noted on iron panel  b12 and folate wnl  Stool for occult blood negative  Will monitor closely, hold off on trasnfusion For now until acute issues resolve  GI eval as out patient once acute issue resolved   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   Hypokalemia resolved  Diarrhea  Prior to admission  c diff negative  resolved   DVT prophylaxis: lovenox SCD   Full code   Disposition -likely HHP/RN-likely will discharge next 48-72 hours   LOS: 17 days  Torrance Stockley, 01/16/2012, 11:24 AM

## 2012-01-17 DIAGNOSIS — J189 Pneumonia, unspecified organism: Secondary | ICD-10-CM

## 2012-01-17 LAB — BASIC METABOLIC PANEL
CO2: 26 mEq/L (ref 19–32)
Calcium: 8 mg/dL — ABNORMAL LOW (ref 8.4–10.5)
Glucose, Bld: 90 mg/dL (ref 70–99)
Sodium: 135 mEq/L (ref 135–145)

## 2012-01-17 LAB — CBC
Hemoglobin: 8.4 g/dL — ABNORMAL LOW (ref 12.0–15.0)
MCH: 29.7 pg (ref 26.0–34.0)
RBC: 2.83 MIL/uL — ABNORMAL LOW (ref 3.87–5.11)

## 2012-01-17 MED ORDER — BACITRACIN-POLYMYXIN B 500-10000 UNIT/GM OP OINT
TOPICAL_OINTMENT | Freq: Every day | OPHTHALMIC | Status: DC
  Administered 2012-01-17 – 2012-01-18 (×2): via OPHTHALMIC
  Filled 2012-01-17: qty 3.5

## 2012-01-17 MED ORDER — VANCOMYCIN HCL IN DEXTROSE 1-5 GM/200ML-% IV SOLN
1000.0000 mg | Freq: Two times a day (BID) | INTRAVENOUS | Status: DC
  Administered 2012-01-17 – 2012-01-19 (×5): 1000 mg via INTRAVENOUS
  Filled 2012-01-17 (×6): qty 200

## 2012-01-17 MED ORDER — ARTIFICIAL TEARS OP OINT
TOPICAL_OINTMENT | Freq: Four times a day (QID) | OPHTHALMIC | Status: DC
  Administered 2012-01-17 – 2012-01-19 (×10): via OPHTHALMIC

## 2012-01-17 MED ORDER — DEXTROSE 5 % IV SOLN
2.0000 g | INTRAVENOUS | Status: DC
  Administered 2012-01-17 – 2012-01-19 (×3): 2 g via INTRAVENOUS
  Filled 2012-01-17 (×3): qty 2

## 2012-01-17 MED ORDER — HYDROMORPHONE HCL PF 1 MG/ML IJ SOLN
0.5000 mg | Freq: Once | INTRAMUSCULAR | Status: AC
  Administered 2012-01-17: 0.5 mg via INTRAVENOUS

## 2012-01-17 MED ORDER — BACITRACIN-POLYMYXIN B 500-10000 UNIT/GM OP OINT: TOPICAL_OINTMENT | Freq: Every day | OPHTHALMIC | Status: DC

## 2012-01-17 NOTE — Progress Notes (Signed)
PT Treatment Note   01/17/12 1144  PT Visit Information  Last PT Received On 01/17/12  Bed Mobility  Sit to Supine 7: Independent  Transfers  Sit to Stand 6: Modified independent (Device/Increase time);With upper extremity assist;With armrests;From chair/3-in-1  Sit to Stand Details (indicate cue type and reason) No cues needed  Stand to Sit 6: Modified independent (Device/Increase time);With armrests;With upper extremity assist;To bed;To chair/3-in-1  Stand to Sit Details No cues needed  Ambulation/Gait  Ambulation/Gait Assistance 5: Supervision  Ambulation/Gait Assistance Details (indicate cue type and reason) Pt scanning enviroment consistently to compensate for visual deficits.  Ambulation Distance (Feet) 300 Feet  Assistive device None (IV pole)  Gait Pattern Decreased stride length (narrow base)  Static Standing Balance  Static Standing - Balance Support No upper extremity supported;During functional activity  Static Standing - Level of Assistance 7: Independent  Dynamic Standing Balance  Dynamic Standing - Balance Support No upper extremity supported;During functional activity  Dynamic Standing - Level of Assistance 5: Stand by assistance  PT - End of Session  Activity Tolerance Patient tolerated treatment well  Patient left in bed;with call bell in reach;with bed alarm set  Nurse Communication Mobility status for ambulation  General  Behavior During Session [facility] for tasks performed  [facility] for tasks performed  PT - Assessment/Plan  Comments on Treatment Session Pt continues to do very well with mobility and is regaining endurance.  PT Plan Discharge plan remains appropriate  PT Frequency Min 3X/week  Follow Up Recommendations Home health PT  Equipment Recommended None recommended by PT  Acute Rehab PT Goals  PT Goal: Sit to Supine/Side - Progress Met  PT Goal: Ambulate - Progress Progressing toward goal  PT Goal: Perform Home Exercise Program - Progress  Progressing toward goal    [facility] PT [phone]

## 2012-01-17 NOTE — Progress Notes (Signed)
   CARE MANAGEMENT NOTE 01/17/2012  Patient:  Donna Tapia, Donna Tapia   Account Number:  [id number]  Date Initiated:  01/06/2012  Documentation initiated by:  Letha Cape  Subjective/Objective Assessment:   dx dehydration  admit lives alone.     Action/Plan:   pt/ot eval- rec ? snf   Anticipated DC Date:  01/19/2012   Anticipated DC Plan:  HOME W HOME HEALTH SERVICES      DC Planning Services  CM consult      [facility] Choice  HOME HEALTH   Choice offered to / List presented to:  C-1 Patient        HH arranged  HH-1 RN      [facility] agency  [facility]   Status of service:  In process, will continue to follow Medicare Important Message given?   (If response is "NO", the following Medicare IM given date fields will be blank) Date Medicare IM given:   Date Additional Medicare IM given:    Discharge Disposition:    Per UR Regulation:    If discussed at Long Length of Stay Meetings, dates discussed:   01/12/2012    Comments:  01/17/12 16:55 Letha Cape RN, BSN [phone] patient states she would like to work with [facility] for [facility] services,  she has friends that will be able to help her out with the iv abx.  Referral made to [facility] for [facility] for iv abx.  Soc will begin 24-48 hr post discharge.  Patient states she can afford her meds if on the $4 list at [facility].  She has transportation.  01/12/12- 1100- Donn Pierini RN, BSN [phone] Pt had to have left eye removed on 01/07/12, still has abscess drain, will need long term IV abx, FC office to see pt today to start Medicaid application process. Pt may need SNF.  01/06/12 9:46 Letha Cape RN, BSN [phone] patient lives alone.  Per physical therapy patient may need st snf if she feels she is unable to manage at home.  NCM will continue to follow for dc needs.

## 2012-01-17 NOTE — Progress Notes (Signed)
INFECTIOUS DISEASE PROGRESS NOTE    Date of Admission:  12/30/2011   Total days of antibiotics 18        Day 16 vancomycin        Day 16 ceftazidime        3/1-3 Moxifloxacin Active Problems:  Dehydration  Renal failure, acute  Uveitis, anterior  Nausea & vomiting  Lobar pneumonia due to unspecified organism      . artificial tears   Left Eye QID  . bacitracin-polymyxin b   Left Eye QHS  . cefTRIAXone (ROCEPHIN)  IV  2 g Intravenous Q24H  . enoxaparin (LOVENOX) injection  40 mg Subcutaneous Q24H  . feeding supplement  237 mL Oral BID  .  HYDROmorphone (DILAUDID) injection  0.5 mg Intravenous Once  . pantoprazole  40 mg Oral BID AC  . polyethylene glycol  17 g Oral Daily  . vancomycin  1,000 mg Intravenous Q12H  . DISCONTD: artificial tears   Left Eye Q6H WA  . DISCONTD: bacitracin-polymyxin b   Left Eye Q6H WA  . DISCONTD: cefTAZidime (FORTAZ)  IV  1 g Intravenous Q8H  . DISCONTD: vancomycin  750 mg Intravenous Q8H    Subjective: Feels better, drains out, rash gone  Objective: Temp:  [98.2 F (36.8 C)-98.3 F (36.8 C)] 98.2 F (36.8 C) (03/18 1335) Pulse Rate:  [80-95] 95  (03/18 1335) Resp:  [17-20] 20  (03/18 1335) BP: (124-134)/(63-74) 124/63 mmHg (03/18 1335) SpO2:  [97 %-98 %] 97 % (03/18 1335) Weight:  [130 lb 15.3 oz (59.4 kg)] 130 lb 15.3 oz (59.4 kg) (03/17 2108)  General: AAO x 3, nad Skin: no rash Lungs: CTA -B Cor: RRR without m/r/g Abdomen: soft, ntnd  TEE - no vegetation  Lab Results Lab Results  Component Value Date   WBC 6.0 01/17/2012   HGB 8.4* 01/17/2012   HCT 25.7* 01/17/2012   MCV 90.8 01/17/2012   PLT 435* 01/17/2012    Lab Results  Component Value Date   CREATININE 0.50 01/17/2012   BUN 4* 01/17/2012   NA 135 01/17/2012   K 3.1* 01/17/2012   CL 101 01/17/2012   CO2 26 01/17/2012    Lab Results  Component Value Date   ALT 35 12/31/2011   AST 50* 12/31/2011   ALKPHOS 341* 12/31/2011   BILITOT 1.2 12/31/2011       Microbiology: Recent  Results (from the past 240 hour(s))  CULTURE, ROUTINE-ABSCESS     Status: Normal   Collection Time   01/07/12  4:14 PM      Component Value Range Status Comment   Specimen Description ABSCESS   Final    Special Requests PSOAS MUSCLE   Final    Gram Stain     Final    Value: MODERATE WBC PRESENT, PREDOMINANTLY PMN     NO SQUAMOUS EPITHELIAL CELLS SEEN     NO ORGANISMS SEEN   Culture NO GROWTH 3 DAYS   Final    Report Status 01/11/2012 FINAL   Final   CULTURE, ROUTINE-ABSCESS     Status: Normal   Collection Time   01/07/12  9:03 PM      Component Value Range Status Comment   Specimen Description ABSCESS EYE LEFT   Final    Special Requests INTRAOCULAR   Final    Gram Stain     Final    Value: ABUNDANT WBC PRESENT,BOTH PMN AND MONONUCLEAR     RARE GRAM POSITIVE COCCI IN PAIRS  Performed at [facility]   Culture NO GROWTH 3 DAYS   Final    Report Status 01/11/2012 FINAL   Final   ANAEROBIC CULTURE     Status: Normal   Collection Time   01/07/12  9:03 PM      Component Value Range Status Comment   Specimen Description ABSCESS EYE LEFT   Final    Special Requests INTRAOCULAR   Final    Gram Stain     Final    Value: ABUNDANT WBC PRESENT,BOTH PMN AND MONONUCLEAR     RARE GRAM POSITIVE COCCI IN PAIRS     Performed at [facility]   Culture NO ANAEROBES ISOLATED   Final    Report Status 01/12/2012 FINAL   Final   GRAM STAIN     Status: Normal   Collection Time   01/07/12  9:03 PM      Component Value Range Status Comment   Specimen Description ABSCESS EYE LEFT   Final    Special Requests INTRAOCULAR   Final    Gram Stain     Final    Value: ABUNDANT WBC PRESENT,BOTH PMN AND MONONUCLEAR     RARE GRAM POSITIVE COCCI IN PAIRS   Report Status 01/07/2012 FINAL   Final     Studies/Results: No results found.   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  Plan: 1. She has had the drain out  now for a few days.  No fever, chills.  I will change her to ceftriaxone 2 grams daily for ease of dosing and will need to continue with vancomycin as well.  I anticipate a 2 month course of antibiotics from this point and will need to follow inflammatory markers.  I will reimage in about 6-8 weeks unless done by neurosurgery or surgery sooner or if they recommend differently.  I will follow up with her in my office in 3-4 weeks.  For home health, she should have a weekly CBC with diff, CMP, ESR, CRP, vanco trough, and continue the vancomycin per protocol for trough of 15-20.     @JCSIG @

## 2012-01-17 NOTE — Plan of Care (Signed)
Problem: Discharge Progression Outcomes Goal: Discharge plan in place and appropriate Outcome: Completed/Met Date Met:  01/17/12 Pt to discharge home with [facility], pt talking with family and friends to arrange for assistance upon discharge- anticiischarge Wednesday/Thursday.

## 2012-01-17 NOTE — Progress Notes (Signed)
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  Allergies  Allergen Reactions  . Sulfur     Patient Measurements: Height: 5\' 9"  (175.3 cm) Weight: 130 lb 15.3 oz (59.4 kg) IBW/kg (Calculated) : 66.2  Adjusted Body Weight:   Vital Signs: Temp: 98.2 F (36.8 C) (03/18 0511) Temp src: Oral (03/18 0511) BP: 134/67 mmHg (03/18 0511) Pulse Rate: 80  (03/18 0511) Intake/Output from previous day: 03/17 0701 - 03/18 0700 In: 2505.7 [P.O.:1740; I.V.:215.7; IV Piggyback:550] Out: 1900 [Urine:1900] Intake/Output from this shift: Total I/O In: 240 [P.O.:240] Out: 925 [Urine:925]  Labs:  Basename 01/17/12 0500  WBC 6.0  HGB 8.4*  PLT 435*  LABCREA --  CREATININE 0.50   Estimated Creatinine Clearance: 68.4 ml/min (by C-G formula based on Cr of 0.5). No results found for this basename: VANCOTROUGH:2,VANCOPEAK:2,VANCORANDOM:2,GENTTROUGH:2,GENTPEAK:2,GENTRANDOM:2,TOBRATROUGH:2,TOBRAPEAK:2,TOBRARND:2,AMIKACINPEAK:2,AMIKACINTROU:2,AMIKACIN:2, in the last 72 hours   Microbiology: Recent Results (from the past 720 hour(s))  URINE CULTURE     Status: Normal   Collection Time   12/31/11  4:15 AM      Component Value Range Status Comment   Specimen Description URINE, CLEAN CATCH   Final    Special Requests NONE   Final    Culture  Setup Time [id number]   Final    Colony Count NO GROWTH   Final    Culture NO GROWTH   Final    Report Status 01/01/2012 FINAL   Final   CULTURE, BLOOD (ROUTINE X 2)     Status: Normal   Collection Time   12/31/11  8:42 AM      Component Value Range Status Comment   Specimen Description BLOOD LEFT ARM   Final    Special Requests BOTTLES DRAWN AEROBIC ONLY 10CC   Final    Culture  Setup Time [id number]   Final    Culture NO GROWTH 5 DAYS   Final    Report Status 01/06/2012 FINAL   Final   CULTURE, BLOOD (ROUTINE X 2)     Status: Normal   Collection Time   12/31/11  8:48 AM      Component Value Range Status Comment   Specimen Description BLOOD LEFT HAND   Final    Special Requests BOTTLES DRAWN AEROBIC ONLY 10CC   Final    Culture  Setup Time [id number]   Final    Culture NO GROWTH 5 DAYS   Final    Report Status 01/06/2012 FINAL   Final   CULTURE, SPUTUM-ASSESSMENT     Status: Normal   Collection Time   12/31/11  2:15 PM      Component Value Range Status Comment   Specimen Description SPUTUM   Final    Special Requests NONE   Final    Sputum evaluation     Final    Value: THIS SPECIMEN IS ACCEPTABLE. RESPIRATORY CULTURE REPORT TO FOLLOW.   Report Status 12/31/2011 FINAL   Final   CULTURE, RESPIRATORY     Status: Normal   Collection Time   12/31/11  2:15 PM      Component Value Range Status Comment   Specimen Description SPUTUM   Final    Special Requests NONE   Final    Gram Stain     Final    Value: NO WBC SEEN     NO SQUAMOUS EPITHELIAL CELLS SEEN     FEW YEAST   Culture NORMAL OROPHARYNGEAL FLORA  Final    Report Status 01/03/2012 FINAL   Final   STOOL CULTURE     Status: Normal   Collection Time   12/31/11  2:45 PM      Component Value Range Status Comment   Specimen Description STOOL   Final    Special Requests NONE   Final    Culture     Final    Value: NO SALMONELLA, SHIGELLA, CAMPYLOBACTER, OR YERSINIA ISOLATED   Report Status 01/04/2012 FINAL   Final   CLOSTRIDIUM DIFFICILE BY PCR     Status: Normal   Collection Time   01/02/12 11:29 AM      Component Value Range Status Comment   C difficile by pcr NEGATIVE  NEGATIVE  Final   EYE CULTURE     Status: Normal   Collection Time   01/03/12  1:45 PM      Component Value Range Status Comment   Specimen Description EYE LEFT   Final    Special Requests     Final    Value: VTREOUS PT ON VANC CEFTAZ DO SENS TO ALL GROWTH INCLUDING COAG NEG STAPH PER MD   Culture NO GROWTH 7 DAYS   Final    Report Status 01/11/2012 FINAL   Final   EYE CULTURE     Status:  Normal   Collection Time   01/03/12  1:45 PM      Component Value Range Status Comment   Specimen Description EYE LEFT   Final    Special Requests     Final    Value: ANTEROR CHAMBER PT ON VANC CEFTAZ DO SENS TO ALL GROWTH INCLUDING COAG NEG STAPH PER MD   Culture NO GROWTH 7 DAYS   Final    Report Status 01/11/2012 FINAL   Final   CULTURE, BLOOD (ROUTINE X 2)     Status: Normal   Collection Time   01/03/12  8:32 PM      Component Value Range Status Comment   Specimen Description BLOOD RIGHT HAND   Final    Special Requests BOTTLES DRAWN AEROBIC ONLY 10CC   Final    Culture  Setup Time [id number]   Final    Culture NO GROWTH 5 DAYS   Final    Report Status 01/10/2012 FINAL   Final   CULTURE, BLOOD (ROUTINE X 2)     Status: Normal   Collection Time   01/03/12  8:39 PM      Component Value Range Status Comment   Specimen Description BLOOD RIGHT FOREARM   Final    Special Requests BOTTLES DRAWN AEROBIC ONLY 10CC   Final    Culture  Setup Time [id number]   Final    Culture NO GROWTH 5 DAYS   Final    Report Status 01/10/2012 FINAL   Final   CULTURE, SPUTUM-ASSESSMENT     Status: Normal   Collection Time   01/04/12  6:48 AM      Component Value Range Status Comment   Specimen Description SPUTUM   Final    Special Requests NONE   Final    Sputum evaluation     Final    Value: MICROSCOPIC FINDINGS SUGGEST THAT THIS SPECIMEN IS NOT REPRESENTATIVE OF LOWER RESPIRATORY SECRETIONS. PLEASE RECOLLECT.     CALLED TO [facility] RN 01/04/12 0800 COSTELLO B   Report Status 01/04/2012 FINAL   Final   MRSA PCR SCREENING     Status: Normal   Collection Time   01/04/12  4:20 PM      Component Value Range Status Comment   MRSA by PCR NEGATIVE  NEGATIVE  Final   CULTURE, ROUTINE-ABSCESS     Status: Normal   Collection Time   01/04/12  9:46 PM      Component Value Range Status Comment   Specimen Description ABSCESS BACK   Final    Special Requests PATIENT ON FOLLOWING LORTAZ, DIFLUCAN EPIDURAL   Final     Gram Stain     Final    Value: ABUNDANT WBC PRESENT,BOTH PMN AND MONONUCLEAR     NO ORGANISMS SEEN   Culture NO GROWTH 3 DAYS   Final    Report Status 01/08/2012 FINAL   Final   ANAEROBIC CULTURE     Status: Normal   Collection Time   01/04/12  9:46 PM      Component Value Range Status Comment   Specimen Description ABSCESS BACK   Final    Special Requests PATIENT ON FOLLOWING LORTAZ, DIFLUCAN EPIDURAL   Final    Gram Stain     Final    Value: ABUNDANT WBC PRESENT,BOTH PMN AND MONONUCLEAR     NO ORGANISMS SEEN     Performed at [facility]   Culture NO ANAEROBES ISOLATED   Final    Report Status 01/09/2012 FINAL   Final   GRAM STAIN     Status: Normal   Collection Time   01/04/12  9:46 PM      Component Value Range Status Comment   Specimen Description ABSCESS BACK   Final    Special Requests PATIENT ON FOLLOWING LORTAZ, DIFLUCAN EPIDURAL   Final    Gram Stain     Final    Value: ABUNDANT WBC PRESENT,BOTH PMN AND MONONUCLEAR     NO ORGANISMS SEEN   Report Status 01/04/2012 FINAL   Final   CULTURE, ROUTINE-ABSCESS     Status: Normal   Collection Time   01/07/12  4:14 PM      Component Value Range Status Comment   Specimen Description ABSCESS   Final    Special Requests PSOAS MUSCLE   Final    Gram Stain     Final    Value: MODERATE WBC PRESENT, PREDOMINANTLY PMN     NO SQUAMOUS EPITHELIAL CELLS SEEN     NO ORGANISMS SEEN   Culture NO GROWTH 3 DAYS   Final    Report Status 01/11/2012 FINAL   Final   CULTURE, ROUTINE-ABSCESS     Status: Normal   Collection Time   01/07/12  9:03 PM      Component Value Range Status Comment   Specimen Description ABSCESS EYE LEFT   Final    Special Requests INTRAOCULAR   Final    Gram Stain     Final    Value: ABUNDANT WBC PRESENT,BOTH PMN AND MONONUCLEAR     RARE GRAM POSITIVE COCCI IN PAIRS     Performed at [facility]   Culture NO GROWTH 3 DAYS   Final    Report Status 01/11/2012 FINAL   Final   ANAEROBIC CULTURE     Status:  Normal   Collection Time   01/07/12  9:03 PM      Component Value Range Status Comment   Specimen Description ABSCESS EYE LEFT   Final    Special Requests INTRAOCULAR   Final    Gram Stain     Final    Value: ABUNDANT WBC PRESENT,BOTH PMN AND  MONONUCLEAR     RARE GRAM POSITIVE COCCI IN PAIRS     Performed at [facility]   Culture NO ANAEROBES ISOLATED   Final    Report Status 01/12/2012 FINAL   Final   GRAM STAIN     Status: Normal   Collection Time   01/07/12  9:03 PM      Component Value Range Status Comment   Specimen Description ABSCESS EYE LEFT   Final    Special Requests INTRAOCULAR   Final    Gram Stain     Final    Value: ABUNDANT WBC PRESENT,BOTH PMN AND MONONUCLEAR     RARE GRAM POSITIVE COCCI IN PAIRS   Report Status 01/07/2012 FINAL   Final     Anti-infectives     Start     Dose/Rate Route Frequency Ordered Stop   01/17/12 1800   vancomycin (VANCOCIN) IVPB 1000 mg/200 mL premix        1,000 mg 200 mL/hr over 60 Minutes Intravenous Every 12 hours 01/17/12 1141     01/07/12 2137   ceFAZolin (ANCEF) injection  Status:  Discontinued          As needed 01/07/12 2138 01/07/12 2217   01/07/12 1930   ceFAZolin (ANCEF) 200 mg/mL injection 200 mg  Status:  Discontinued        1 mL Subconjunctival  Once 01/07/12 1924 01/13/12 1026   01/07/12 1930   ceFAZolin (ANCEF) 200 mg/mL injection 200 mg  Status:  Discontinued        200 mg Subconjunctival  Once 01/07/12 1926 01/13/12 1026   01/06/12 0200   vancomycin (VANCOCIN) 750 mg in sodium chloride 0.9 % 150 mL IVPB  Status:  Discontinued        750 mg 150 mL/hr over 60 Minutes Intravenous Every 8 hours 01/05/12 1952 01/17/12 1140   01/05/12 1300   cefTAZidime (FORTAZ) ophth injection 100 mg        100 mg Subconjunctival  Once 01/05/12 1247 01/05/12 1443   01/05/12 1300   vancomycin (VANCOCIN) ophth injection 25 mg        25 mg Subconjunctival  Once 01/05/12 1247 01/05/12 1446   01/05/12 1300   vancomycin (VANCOCIN)  injection INJ 1 mg        1 mg Subconjunctival  Once 01/05/12 1247 01/05/12 1446   01/05/12 1200   cefTAZidime (FORTAZ) ophth injection 2.25 mg        2.25 mg Intravitreal  Once 01/05/12 1053 01/05/12 1444   01/04/12 2134   50,000 units bacitracin in 0.9% normal saline 250 mL irrigation  Status:  Discontinued          As needed 01/04/12 2145 01/04/12 2247   01/04/12 2100   bacitracin 16109 UNITS injection     Comments: AURAND, BRANDI: cabinet override         01/04/12 2100 01/05/12 0859   01/04/12 2100   ceFAZolin (ANCEF) 1-5 GM-% IVPB     Comments: AURAND, BRANDI: cabinet override         01/04/12 2100 01/05/12 0859   01/04/12 1830   fluconazole (DIFLUCAN) IVPB 400 mg  Status:  Discontinued        400 mg 200 mL/hr over 60 Minutes Intravenous Every 24 hours 01/03/12 1827 01/07/12 1616   01/03/12 1930   fluconazole (DIFLUCAN) IVPB 800 mg        800 mg 400 mL/hr over 60 Minutes Intravenous  Once 01/03/12 1826 01/03/12 2139  01/03/12 1245   cefTAZidime (FORTAZ) ophth injection 2.25 mg  Status:  Discontinued        2.25 mg Intravitreal  Once 01/03/12 1135 01/09/12 0427   01/03/12 1245   cefTAZidime (FORTAZ) ophth injection 100 mg  Status:  Discontinued        100 mg Subconjunctival  Once 01/03/12 1135 01/09/12 0427   01/03/12 1200   vancomycin (VANCOCIN) ophth injection 0.1 mg  Status:  Discontinued        0.1 mg Intravitreal  Once 01/03/12 1130 01/09/12 0427   01/03/12 1200   vancomycin (VANCOCIN) ophth injection 25 mg  Status:  Discontinued        25 mg Subconjunctival  Once 01/03/12 1131 01/09/12 0427   01/03/12 0500   vancomycin (VANCOCIN) 750 mg in sodium chloride 0.9 % 150 mL IVPB  Status:  Discontinued        750 mg 150 mL/hr over 60 Minutes Intravenous Every 12 hours 01/02/12 1716 01/05/12 1953   01/03/12 0200   ceFEPIme (MAXIPIME) 2 g in dextrose 5 % 50 mL IVPB  Status:  Discontinued        2 g 100 mL/hr over 30 Minutes Intravenous Every 8 hours 01/02/12 1716  01/02/12 2041   01/02/12 2200   cefTAZidime (FORTAZ) 1 g in dextrose 5 % 50 mL IVPB        1 g 100 mL/hr over 30 Minutes Intravenous 3 times per day 01/02/12 2106     01/02/12 1400   ceFEPIme (MAXIPIME) 2 g in dextrose 5 % 50 mL IVPB  Status:  Discontinued        2 g 100 mL/hr over 30 Minutes Intravenous 3 times per day 01/02/12 1318 01/02/12 1716   01/02/12 1400   vancomycin (VANCOCIN) 750 mg in sodium chloride 0.9 % 150 mL IVPB  Status:  Discontinued        750 mg 150 mL/hr over 60 Minutes Intravenous Every 12 hours 01/02/12 1350 01/02/12 1716   12/31/11 1530   valACYclovir (VALTREX) tablet 2,000 mg        2,000 mg Oral 2 times daily 12/31/11 1358 12/31/11 2235   12/31/11 0100   moxifloxacin (AVELOX) IVPB 400 mg  Status:  Discontinued        400 mg 250 mL/hr over 60 Minutes Intravenous Every 24 hours 12/31/11 0040 01/03/12 1913          Assessment: Patient is a 61 y.o F on vancomycin and fortaz day #16 for 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  Neurosurgery recommends to treat with abx for 3 months.  Patient is afebrile and cultures remains negative.  Last vancomycin trough level 3/15 19.1 on vancomycin 750 mg IV q 8hrs.  MD asked pharmacy to change to q 12 hr dosing for ease of home health dosing at discharge.  Goal of Therapy:  Vancomycin trough level 15-20 mcg/ml  Plan:  1) no change for Fortaz. 2) Change vancomycin to 1g IV q12 hrs.  Will check trough as soon as patient at steady state (Wed. AM) to ensure still in therapeutic range.  Teran Daughenbaugh C 01/17/2012,11:47 AM

## 2012-01-17 NOTE — Progress Notes (Signed)
Subjective: Rash resolved! Anxious to go home.  Objective: Vital signs in last 24 hours: Filed Vitals:   01/16/12 0622 01/16/12 1500 01/16/12 2108 01/17/12 0511  BP: 106/70 126/70 132/74 134/67  Pulse: 87 108 89 80  Temp: 98.1 F (36.7 C) 98.4 F (36.9 C) 98.3 F (36.8 C) 98.2 F (36.8 C)  TempSrc: Oral Oral Oral Oral  Resp: 18 18 18 17   Height:      Weight:   59.4 kg (130 lb 15.3 oz)   SpO2: 95% 95% 98% 98%   Weight change: -2 kg (-4 lb 6.6 oz)  Intake/Output Summary (Last 24 hours) at 01/17/12 1355 Last data filed at 01/17/12 1127  Gross per 24 hour  Intake 1945.67 ml  Output   2825 ml  Net -879.33 ml    Physical Exam: General: Awake, Oriented, comforatbale HEENT: L eye removed , covered Neck: Supple CV: S1 and S2, rrr Lungs: clear ant Abdomen: Soft, Nontender, Nondistended, +bowel sounds. Ext: Good pulses. No edema., weakness B/L Neuro:Non Focal Musculoskeletal-3x2 small cystic area in the upper shoulder, no overlying erythema or tenderness.  Lab Results:  Basename 01/17/12 0500  NA 135  K 3.1*  CL 101  CO2 26  GLUCOSE 90  BUN 4*  CREATININE 0.50  CALCIUM 8.0*  MG --  PHOS --   No results found for this basename: AST:2,ALT:2,ALKPHOS:2,BILITOT:2,PROT:2,ALBUMIN:2 in the last 72 hours No results found for this basename: LIPASE:2,AMYLASE:2 in the last 72 hours  Basename 01/17/12 0500  WBC 6.0  NEUTROABS --  HGB 8.4*  HCT 25.7*  MCV 90.8  PLT 435*   No results found for this basename: CKTOTAL:3,CKMB:3,CKMBINDEX:3,TROPONINI:3 in the last 72 hours No components found with this basename: POCBNP:3 No results found for this basename: DDIMER:2 in the last 72 hours No results found for this basename: HGBA1C:2 in the last 72 hours No results found for this basename: CHOL:2,HDL:2,LDLCALC:2,TRIG:2,CHOLHDL:2,LDLDIRECT:2 in the last 72 hours No results found for this basename: TSH,T4TOTAL,FREET3,T3FREE,THYROIDAB in the last 72 hours No results found for  this basename: VITAMINB12:2,FOLATE:2,FERRITIN:2,TIBC:2,IRON:2,RETICCTPCT:2 in the last 72 hours  Micro Results: Recent Results (from the past 240 hour(s))  CULTURE, ROUTINE-ABSCESS     Status: Normal   Collection Time   01/07/12  4:14 PM      Component Value Range Status Comment   Specimen Description ABSCESS   Final    Special Requests PSOAS MUSCLE   Final    Gram Stain     Final    Value: MODERATE WBC PRESENT, PREDOMINANTLY PMN     NO SQUAMOUS EPITHELIAL CELLS SEEN     NO ORGANISMS SEEN   Culture NO GROWTH 3 DAYS   Final    Report Status 01/11/2012 FINAL   Final   CULTURE, ROUTINE-ABSCESS     Status: Normal   Collection Time   01/07/12  9:03 PM      Component Value Range Status Comment   Specimen Description ABSCESS EYE LEFT   Final    Special Requests INTRAOCULAR   Final    Gram Stain     Final    Value: ABUNDANT WBC PRESENT,BOTH PMN AND MONONUCLEAR     RARE GRAM POSITIVE COCCI IN PAIRS     Performed at [facility]   Culture NO GROWTH 3 DAYS   Final    Report Status 01/11/2012 FINAL   Final   ANAEROBIC CULTURE     Status: Normal   Collection Time   01/07/12  9:03 PM  Component Value Range Status Comment   Specimen Description ABSCESS EYE LEFT   Final    Special Requests INTRAOCULAR   Final    Gram Stain     Final    Value: ABUNDANT WBC PRESENT,BOTH PMN AND MONONUCLEAR     RARE GRAM POSITIVE COCCI IN PAIRS     Performed at [facility]   Culture NO ANAEROBES ISOLATED   Final    Report Status 01/12/2012 FINAL   Final   GRAM STAIN     Status: Normal   Collection Time   01/07/12  9:03 PM      Component Value Range Status Comment   Specimen Description ABSCESS EYE LEFT   Final    Special Requests INTRAOCULAR   Final    Gram Stain     Final    Value: ABUNDANT WBC PRESENT,BOTH PMN AND MONONUCLEAR     RARE GRAM POSITIVE COCCI IN PAIRS   Report Status 01/07/2012 FINAL   Final     Studies/Results: No results found.  Medications: I have reviewed the patient's  current medications. Scheduled Meds:    . artificial tears   Left Eye QID  . bacitracin-polymyxin b   Left Eye QHS  . cefTAZidime (FORTAZ)  IV  1 g Intravenous Q8H  . enoxaparin (LOVENOX) injection  40 mg Subcutaneous Q24H  . feeding supplement  237 mL Oral BID  .  HYDROmorphone (DILAUDID) injection  0.5 mg Intravenous Once  . pantoprazole  40 mg Oral BID AC  . polyethylene glycol  17 g Oral Daily  . vancomycin  1,000 mg Intravenous Q12H  . DISCONTD: artificial tears   Left Eye Q6H WA  . DISCONTD: bacitracin-polymyxin b   Left Eye Q6H WA  . DISCONTD: vancomycin  750 mg Intravenous Q8H   Continuous Infusions:    . sodium chloride 20 mL/hr at 01/16/12 1745   PRN Meds:.acetaminophen, acetaminophen, alum & mag hydroxide-simeth, diphenhydrAMINE, HYDROcodone-acetaminophen, hydrocortisone cream, HYDROmorphone (DILAUDID) injection, ondansetron (ZOFRAN) IV, ondansetron, sodium chloride  Assessment/Plan: Interim note  38 female with chr low back pain and hx of hep B presented with 2 days hx of painless left eye blindness with hx of nausea. Vomiting and diarrhea 1 week back with findings of left anterior uveitis and multilobar left sided pneumonia on CT scan.  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 I will attempt to get in touch with ophthalmology as well to see if they have any further recommendations prior to the patient being discharged in the next 2-3 days.  Rash Resolved ?etiology-?antibiotic related,   Leukocytosis Improving on antibiotics Resolved  3/9  Endophthalmitis s/p removal and culture on 3/8- showing gram + cocci Initially thought to be possible autoimmune , however her uveitis appears much worse since 3/3 and 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  evident on MRI from 3/5 and likely the source of infection  Drain placed by IR- fell out on 3/7  drain placed in L psoas muscle on 3/8, IR continues to follow-Drain removed 3/16-after CT Abd showed significant improvement Cont empiric abx -ID will need to clarify antibiotic duration  Left sided multilobar PNA  Secondary to infection- on abx  Anemia  Iron deficiency noted on iron panel  b12 and folate wnl  Stool for occult blood negative  Will monitor closely, hold off on trasnfusion For now until acute issues resolve  GI eval as out patient once acute issue resolved   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   Hypokalemia resolved  Diarrhea  Prior to admission  c diff negative  resolved   DVT prophylaxis: lovenox SCD   Full code   Disposition -likely HHP/RN-likely will discharge next 48-72 hours   LOS: 18 days  Juancarlos Crescenzo, 01/17/2012, 1:55 PM

## 2012-01-17 NOTE — Progress Notes (Signed)
Patient ID: Donna Tapia, female   DOB: 1951/08/11, 61 y.o.   MRN: [id number]  S: Some ocassional FBS in nasal corner OS.  Otherwise, no eye pain.  Still with back pain.      Relevant meds:  IV: Vanc, Ceftaz  Topical: poly/bac ung alternating with lacrilube Q 3 hrs.     Lighted bedside exam  Motility OS:-3 all directions.   OD  External/adnexa: Normal  Lids/lashes: Normal  Conjunctiva White, quiet  Cornea: Clear  OS  External/adnexa: trace edema.Soft on retropulsion of orbital contents  Lids/lashes: trace upper lid edema; upper and lower bolstered temporal tarsorrhaphy in place;  Conformer in place with iodoform gauze extending through central opening.  Conjunctiva: Pink;. post-surgical change. Covered by conformer.    Path: Intraocular contents with suppurative inflammation; no mass//malignancy.  Micro:  Blood, lumbosacral epidural abscess, psoas abscess, AC/Vit: No growth.  Intraocular contents gram stain: GPC in pairs  Intraocular contents culture: No growth.  Imaging:  CT ABD w/ contrast: IMPRESSION:  1. Placement of a left psoas drain, with resolution of fluid  collection/abscess.  2. Improved appearance of the inferior chest. Residual small left  pleural effusion. Minimal loculated complex fluid versus  atelectasis, incompletely imaged.  3. Ill-defined postoperative fluid about the lumbosacral spine.  Removal of surgical drain without evidence of well-defined  paraspinal fluid collection.    Assessment and Plan:  -- Endogenous endophthalmitis/panophthalmitis OS. POD #10 s/p evisceration OS.   Donna Tapia continues to demonstrate clinical improvement.   The remaining packing was removed at the bedside.   Rec:  Decrease Poly/bac to QHS.  Lacrilube QID OS.

## 2012-01-18 MED ORDER — VANCOMYCIN HCL IN DEXTROSE 1-5 GM/200ML-% IV SOLN: 1000.0000 mg | Freq: Two times a day (BID) | INTRAVENOUS | Status: DC

## 2012-01-18 MED ORDER — FERROUS SULFATE 325 (65 FE) MG PO TABS: 325.0000 mg | ORAL_TABLET | Freq: Every day | ORAL | Status: AC

## 2012-01-18 MED ORDER — BACITRACIN-POLYMYXIN B 500-10000 UNIT/GM OP OINT: TOPICAL_OINTMENT | Freq: Every day | OPHTHALMIC | Status: AC

## 2012-01-18 MED ORDER — HYDROCODONE-ACETAMINOPHEN 10-325 MG PO TABS: 2.0000 | ORAL_TABLET | Freq: Four times a day (QID) | ORAL | Status: DC | PRN

## 2012-01-18 MED ORDER — POLYETHYLENE GLYCOL 3350 17 G PO PACK: 17.0000 g | PACK | Freq: Every day | ORAL | Status: AC

## 2012-01-18 MED ORDER — HYDROCODONE-ACETAMINOPHEN 10-325 MG PO TABS
2.0000 | ORAL_TABLET | ORAL | Status: DC | PRN
  Administered 2012-01-18 – 2012-01-19 (×6): 2 via ORAL
  Filled 2012-01-18 (×6): qty 2

## 2012-01-18 MED ORDER — PROMETHAZINE HCL 50 MG PO TABS: 25.0000 mg | ORAL_TABLET | Freq: Four times a day (QID) | ORAL | Status: AC | PRN

## 2012-01-18 MED ORDER — OMEPRAZOLE 20 MG PO CPDR: 20.0000 mg | DELAYED_RELEASE_CAPSULE | Freq: Every day | ORAL | Status: AC

## 2012-01-18 MED ORDER — ENSURE CLINICAL ST REVIGOR PO LIQD: 237.0000 mL | Freq: Two times a day (BID) | ORAL | Status: DC

## 2012-01-18 MED ORDER — DEXTROSE 5 % IV SOLN: 2.0000 g | INTRAVENOUS | Status: DC

## 2012-01-18 MED ORDER — OXYCODONE-ACETAMINOPHEN 5-325 MG PO TABS: 2.0000 | ORAL_TABLET | Freq: Four times a day (QID) | ORAL | Status: AC | PRN

## 2012-01-18 MED ORDER — BACITRACIN-POLYMYXIN B 500-10000 UNIT/GM OP OINT: TOPICAL_OINTMENT | Freq: Every day | OPHTHALMIC | Status: DC

## 2012-01-18 MED ORDER — ARTIFICIAL TEARS OP OINT: TOPICAL_OINTMENT | Freq: Four times a day (QID) | OPHTHALMIC | Status: AC

## 2012-01-18 NOTE — Progress Notes (Signed)
   CARE MANAGEMENT NOTE 01/18/2012  Patient:  Donna Tapia, Donna Tapia   Account Number:  [id number]  Date Initiated:  01/06/2012  Documentation initiated by:  Letha Cape  Subjective/Objective Assessment:   dx dehydration  admit lives alone.     Action/Plan:   pt/ot eval- rec ? snf   Anticipated DC Date:  01/19/2012   Anticipated DC Plan:  HOME W HOME HEALTH SERVICES      DC Planning Services  CM consult      [facility] Choice  HOME HEALTH   Choice offered to / List presented to:  C-1 Patient        HH arranged  HH-1 RN      [facility] agency  [facility]   Status of service:  In process, will continue to follow Medicare Important Message given?   (If response is "NO", the following Medicare IM given date fields will be blank) Date Medicare IM given:   Date Additional Medicare IM given:    Discharge Disposition:    Per UR Regulation:    If discussed at Long Length of Stay Meetings, dates discussed:   01/12/2012    Comments:  01/18/12 12:08 Letha Cape RN, BSN [phone] patient is eligible of zz fund.  Patient will have HHRN with [facility].  [facility] will need to get a weekly cbc, cmet, bmet and vanc trough and send it to Dr. [name], gave this information to [facility] with [facility].   01/17/12 16:55 Letha Cape RN, BSN [phone] patient states she would like to work with [facility] for [facility] services,  she has friends that will be able to help her out with the iv abx.  Referral made to [facility] for [facility] for iv abx.  Soc will begin 24-48 hr post discharge.  Patient states she can afford her meds if on the $4 list at [facility].  She has transportation.  Patient is eligble for zz fund.  01/12/12- 1100- Donn Pierini RN, BSN [phone] Pt had to have left eye removed on 01/07/12, still has abscess drain, will need long term IV abx, FC office to see pt today to start Medicaid application process. Pt may need SNF.  01/06/12 9:46 Letha Cape RN, BSN [phone] patient lives alone.  Per physical therapy  patient may need st snf if she feels she is unable to manage at home.  NCM will continue to follow for dc needs.

## 2012-01-18 NOTE — Progress Notes (Signed)
Nutrition Follow-up  Diet Order:  Regular S/p evisceration OS on 3/8, continues with abx. Lumbar epidural abscess, left psoas abscess, improving and drain removed on 3/16. Anticipated d/c for this week, home with HH vs SNF.  PO intake remains god, mostly >75% of meals. Continues with Ensure Clinical Strength BID.   Meds: Scheduled Meds:   . artificial tears   Left Eye QID  . bacitracin-polymyxin b   Left Eye QHS  . cefTRIAXone (ROCEPHIN)  IV  2 g Intravenous Q24H  . enoxaparin (LOVENOX) injection  40 mg Subcutaneous Q24H  . feeding supplement  237 mL Oral BID  .  HYDROmorphone (DILAUDID) injection  0.5 mg Intravenous Once  . pantoprazole  40 mg Oral BID AC  . polyethylene glycol  17 g Oral Daily  . vancomycin  1,000 mg Intravenous Q12H  . DISCONTD: artificial tears   Left Eye Q6H WA  . DISCONTD: bacitracin-polymyxin b   Left Eye Q6H WA  . DISCONTD: bacitracin-polymyxin b   Left Eye QHS  . DISCONTD: cefTAZidime (FORTAZ)  IV  1 g Intravenous Q8H  . DISCONTD: vancomycin  750 mg Intravenous Q8H   Continuous Infusions:   . sodium chloride 20 mL/hr at 01/18/12 0606   PRN Meds:.acetaminophen, acetaminophen, alum & mag hydroxide-simeth, diphenhydrAMINE, HYDROcodone-acetaminophen, hydrocortisone cream, ondansetron (ZOFRAN) IV, ondansetron, sodium chloride, DISCONTD: HYDROcodone-acetaminophen, DISCONTD:  HYDROmorphone (DILAUDID) injection  Labs:  CMP     Component Value Date/Time   NA 135 01/17/2012 0500   K 3.1* 01/17/2012 0500   CL 101 01/17/2012 0500   CO2 26 01/17/2012 0500   GLUCOSE 90 01/17/2012 0500   BUN 4* 01/17/2012 0500   CREATININE 0.50 01/17/2012 0500   CALCIUM 8.0* 01/17/2012 0500   PROT 5.5* 12/31/2011 1155   ALBUMIN 1.5* 12/31/2011 1155   AST 50* 12/31/2011 1155   ALT 35 12/31/2011 1155   ALKPHOS 341* 12/31/2011 1155   BILITOT 1.2 12/31/2011 1155   GFRNONAA >90 01/17/2012 0500   GFRAA >90 01/17/2012 0500     Intake/Output Summary (Last 24 hours) at 01/18/12 0924 Last data filed at  01/18/12 0701  Gross per 24 hour  Intake 1431.67 ml  Output   2800 ml  Net -1368.33 ml    Weight Status:  132 lbs, mostly stable, fluctuating with fluid balance but remains consistent with admission weight.   Re-estimated needs:  1500-1750 kcal, 60-70 gm protein   Nutrition Dx:  No current nutrition dx, previously was inadequate oral intake but has been resolved.   Goal:  PO intake will remain mostly >75% of meals and supplements, met  Intervention:  No new nutrition interventions at this time.   Monitor:  PO intake, weight, labs, I/O's   [name] Pager #:  [phone]

## 2012-01-18 NOTE — Progress Notes (Signed)
Dressing changed to back streri strips intact to incision.

## 2012-01-18 NOTE — Progress Notes (Signed)
Clinical Social Worker received notification from Scientist, physiological that pt plan is to return home with home health services as pt has progressed with PT/OT while here in the hospital. Clinical Social Worker did not follow up with pt in regard to SNF placement as pt plan is to return home health services. No further social work needs at this time. Please re-consult if further needs arise.   Jacklynn Lewis, MSW, LCSWA  Clinical Social Work [phone]

## 2012-01-18 NOTE — Progress Notes (Signed)
Subjective: Rash resolved! Anxious to go home.  Objective: Vital signs in last 24 hours: Filed Vitals:   01/17/12 2213 01/18/12 0532 01/18/12 1145 01/18/12 1311  BP: 153/73 145/73  125/70  Pulse: 92 99 110 107  Temp: 98.6 F (37 C) 98 F (36.7 C)  97.9 F (36.6 C)  TempSrc: Oral Oral    Resp: 18 17  18   Height:      Weight:  60 kg (132 lb 4.4 oz)    SpO2: 97% 92% 95% 97%   Weight change: 0.6 kg (1 lb 5.2 oz)  Intake/Output Summary (Last 24 hours) at 01/18/12 1525 Last data filed at 01/18/12 1345  Gross per 24 hour  Intake 1671.67 ml  Output   2475 ml  Net -803.33 ml    Physical Exam: General: Awake, Oriented, comforatbale HEENT: L eye removed , covered Neck: Supple CV: S1 and S2, rrr Lungs: clear ant Abdomen: Soft, Nontender, Nondistended, +bowel sounds. Ext: Good pulses. No edema., weakness B/L Neuro:Non Focal Musculoskeletal-3x2 small cystic area in the upper shoulder, no overlying erythema or tenderness.  Lab Results:  Basename 01/17/12 0500  NA 135  K 3.1*  CL 101  CO2 26  GLUCOSE 90  BUN 4*  CREATININE 0.50  CALCIUM 8.0*  MG --  PHOS --   No results found for this basename: AST:2,ALT:2,ALKPHOS:2,BILITOT:2,PROT:2,ALBUMIN:2 in the last 72 hours No results found for this basename: LIPASE:2,AMYLASE:2 in the last 72 hours  Basename 01/17/12 0500  WBC 6.0  NEUTROABS --  HGB 8.4*  HCT 25.7*  MCV 90.8  PLT 435*   No results found for this basename: CKTOTAL:3,CKMB:3,CKMBINDEX:3,TROPONINI:3 in the last 72 hours No components found with this basename: POCBNP:3 No results found for this basename: DDIMER:2 in the last 72 hours No results found for this basename: HGBA1C:2 in the last 72 hours No results found for this basename: CHOL:2,HDL:2,LDLCALC:2,TRIG:2,CHOLHDL:2,LDLDIRECT:2 in the last 72 hours No results found for this basename: TSH,T4TOTAL,FREET3,T3FREE,THYROIDAB in the last 72 hours No results found for this basename:  VITAMINB12:2,FOLATE:2,FERRITIN:2,TIBC:2,IRON:2,RETICCTPCT:2 in the last 72 hours  Micro Results: No results found for this or any previous visit (from the past 240 hour(s)).  Studies/Results: No results found.  Medications: I have reviewed the patient's current medications. Scheduled Meds:    . artificial tears   Left Eye QID  . bacitracin-polymyxin b   Left Eye QHS  . cefTRIAXone (ROCEPHIN)  IV  2 g Intravenous Q24H  . enoxaparin (LOVENOX) injection  40 mg Subcutaneous Q24H  . feeding supplement  237 mL Oral BID  . pantoprazole  40 mg Oral BID AC  . polyethylene glycol  17 g Oral Daily  . vancomycin  1,000 mg Intravenous Q12H  . DISCONTD: bacitracin-polymyxin b   Left Eye QHS   Continuous Infusions:    . sodium chloride 20 mL/hr at 01/18/12 0606   PRN Meds:.acetaminophen, acetaminophen, alum & mag hydroxide-simeth, diphenhydrAMINE, HYDROcodone-acetaminophen, hydrocortisone cream, ondansetron (ZOFRAN) IV, ondansetron, sodium chloride, DISCONTD: HYDROcodone-acetaminophen, DISCONTD:  HYDROmorphone (DILAUDID) injection  Assessment/Plan: Interim note  33 female with chr low back pain and hx of hep B presented with 2 days hx of painless left eye blindness with hx of nausea. Vomiting and diarrhea 1 week back with findings of left anterior uveitis and multilobar left sided pneumonia on CT scan.  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  Patient had MRI of her cervical thoracic and LS spine done on 3/5 showing epidural abscess over L4-S2 with  left psoas abscess s/p drain placement in  Spine and psoas abscess NGTD on any cultures- plan per ID TEE negatvie   PLAN:  Patient continues to improve, now no drains in place.have discussed with infectious disease regarding the duration of antibiotic therapy. I will attempt to get in touch with ophthalmology as well to see if they have any further recommendations prior to the patient  being discharged in the next 2-3 days.  Rash Resolved ?etiology-?antibiotic related,   Leukocytosis Improving on antibiotics Resolved 3/9  Endophthalmitis s/p removal and culture on 3/8- showing gram + cocci Initially thought to be possible autoimmune , however her uveitis appears much worse since 3/3 and 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  -Dr Randon Goldsmith performed vitrectomy on 3/4 with cx sent-negative Eye worsened and on 3/8 underwent removal and culture (pending)-all cultures negative to date. Current plans are to followup with Dr. Randon Goldsmith one week from discharge.  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  Left sided multilobar PNA  Secondary to infection- on abx  Anemia  Iron deficiency noted on iron panel  b12 and folate wnl  Stool for occult blood negative  Will monitor closely, hold off on trasnfusion For now until acute issues resolve  GI eval as out patient once acute issue resolved   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   Hypokalemia resolved  Diarrhea  Prior to admission  c diff negative  resolved   DVT prophylaxis: lovenox SCD   Full code   Disposition -likely HHP/RN-likely will discharge next 1-2 days   LOS: 19 days  Laurence Crofford, 01/18/2012, 3:25 PM

## 2012-01-18 NOTE — Progress Notes (Signed)
   CARE MANAGEMENT NOTE 01/18/2012  Patient:  Donna Tapia, Donna Tapia   Account Number:  [id number]  Date Initiated:  01/06/2012  Documentation initiated by:  Letha Cape  Subjective/Objective Assessment:   dx dehydration  admit lives alone.     Action/Plan:   pt/ot eval- rec ? snf   Anticipated DC Date:  01/19/2012   Anticipated DC Plan:  HOME W HOME HEALTH SERVICES      DC Planning Services  CM consult      [facility] Choice  HOME HEALTH   Choice offered to / List presented to:  C-1 Patient        HH arranged  HH-1 RN  HH-2 PT      [facility] agency  [facility]   Status of service:  In process, will continue to follow Medicare Important Message given?   (If response is "NO", the following Medicare IM given date fields will be blank) Date Medicare IM given:   Date Additional Medicare IM given:    Discharge Disposition:    Per UR Regulation:    If discussed at Long Length of Stay Meetings, dates discussed:   01/12/2012    Comments:  01/18/12 12:08 Letha Cape RN, BSN [phone] patient is eligible of zz fund.  Patient will have HHRN with [facility].  [facility] will need to get a weekly every wed cbc, cmet, bmet and vanc trough and send it to Dr. [name], gave this information to [facility] with [facility].  Patient can not have an aide secondary to self pay.   01/17/12 16:55 Letha Cape RN, BSN [phone] patient states she would like to work with [facility] for [facility] services,  she has friends that will be able to help her out with the iv abx.  Referral made to [facility] for [facility] for iv abx.  Soc will begin 24-48 hr post discharge.  Patient states she can afford her meds if on the $4 list at [facility].  She has transportation.  Patient is eligble for zz fund.  01/12/12- 1100- Donn Pierini RN, BSN [phone] Pt had to have left eye removed on 01/07/12, still has abscess drain, will need long term IV abx, FC office to see pt today to start Medicaid application process. Pt may need SNF.  01/06/12 9:46  Letha Cape RN, BSN [phone] patient lives alone.  Per physical therapy patient may need st snf if she feels she is unable to manage at home.  NCM will continue to follow for dc needs.

## 2012-01-18 NOTE — Progress Notes (Signed)
Occupational Therapy Treatment Patient Details Name: Donna Tapia MRN: [id number] DOB: 12-06-1950 Today's Date: 01/18/2012  OT Assessment/Plan OT Assessment/Plan Comments on Treatment Session: Pt with very positive attitude during session and feels real encouraged that she is getting stronger.  Still with increased LE weakness for sit to stand from low surfaces as well as limited endurance.  Overall supervision level with ADLS.  Recommend continued OT to progress to modified independent level. OT Plan: Discharge plan remains appropriate OT Frequency: Min 2X/week Follow Up Recommendations: Home health OT Equipment Recommended: None recommended by OT OT Goals ADL Goals ADL Goal: Lower Body Dressing - Progress: Progressing toward goals ADL Goal: Toilet Transfer - Progress: Progressing toward goals ADL Goal: Toileting - Hygiene - Progress: Progressing toward goals ADL Goal: Additional Goal #1 - Progress: Progressing toward goals  OT Treatment Precautions/Restrictions  Precautions Precautions: Fall Precaution Comments: Blind in the left eye, has eye patch cover recent eye removal. Required Braces or Orthoses: No Restrictions Weight Bearing Restrictions: No   ADL ADL Grooming: Simulated;Supervision/safety Where Assessed - Grooming: Standing at sink Where Assessed - Upper Body Dressing: Sitting, chair Lower Body Dressing: Simulated (Donn gripper socks by crossing LEs while sitting edge of the) Where Assessed - Lower Body Dressing: Sitting, chair;Unsupported Toilet Transfer: Research scientist (life sciences) Method: Proofreader: Regular height toilet;Grab bars Toileting - Clothing Manipulation: Simulated;Supervision/safety Where Assessed - Toileting Clothing Manipulation: Sit to stand from 3-in-1 or toilet Toileting - Hygiene: Simulated;Supervision/safety Where Assessed - Toileting Hygiene: Sit to stand from 3-in-1 or toilet Ambulation Related to  ADLs: Pt overall close supervision for ambulation.  Encouraged circular scanning pattern to identify objects on the left of midline.  Occassional use of hand rail for support but only 10 % of the time. ADL Comments: Pt overall still supervison level for simulated selfcare and toileting.  Demonstrated AE for use if her back pain increases or if she has trouble with crossing her LE.  Still with bilateral LE weakness requiring the use of a grab bar or arm rests for sit to stand.  Able to compensate for left visual loss with  min instructional cueing.   Mobility  Bed Mobility Bed Mobility: No Transfers Transfers: Yes Sit to Stand: 5: Supervision;From toilet Sit to Stand Details (indicate cue type and reason): Needs use of grab bar for sit to stand from regular height toilet. Stand to Sit: 5: Supervision;To toilet  End of Session OT - End of Session Activity Tolerance: Patient tolerated treatment well Patient left: in chair;with call bell in reach General Behavior During Session: [facility] for tasks performed Cognition: [facility] for tasks performed  Severn Goddard OTR/L 01/18/2012, 12:55 PM Pager number [phone]

## 2012-01-18 NOTE — Progress Notes (Signed)
PT Cancellation Note  Treatment cancelled today due to patient politely declining to work with physical therapy. Patient had just completed occupational therapy and requested PT to come back tomorrow.   Thanks 01/18/2012 Fredrich Birks PTA [phone] pager [phone] office     Fredrich Birks 01/18/2012, 2:35 PM

## 2012-01-18 NOTE — Discharge Summary (Addendum)
PATIENT DETAILS Name: Donna Tapia Age: 61 y.o. Sex: female Date of Birth: 11-Aug-1951 MRN: [id number]. 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 Hepatitis B   . Anemia     DISCHARGE MEDICATIONS: Medication List  As of 01/19/2012  3:32 PM   STOP taking these medications         loperamide 2 MG capsule      OVER THE COUNTER MEDICATION         TAKE these medications         acetaminophen 325 MG tablet   Commonly known as: TYLENOL   Take 650 mg by mouth every 6 (six) hours as needed. For pain      artificial tears Oint ophthalmic ointment   Apply to eye 4 (four) times daily.      bacitracin-polymyxin b ophthalmic ointment   Commonly known as: POLYSPORIN   Place into the left eye at bedtime.      CO ENZYME Q-10 PO   Take 1 capsule by mouth daily.      dextrose 5 % SOLN 50 mL with cefTRIAXone 2 G SOLR 2 g   Inject 2 g into the vein daily. -For 8 weeks from 01/15/12      feeding supplement Liqd   Take 237 mLs by mouth 2 (two) times daily at 10 AM and 5 PM.      ferrous sulfate 325 (65 FE) MG tablet   Take 1 tablet (325 mg total) by mouth daily with breakfast.      folic acid 800 MCG tablet   Commonly known as: FOLVITE   Take 400 mcg by mouth daily.      mulitivitamin with minerals Tabs   Take 1 tablet by mouth daily.      omeprazole 20 MG capsule   Commonly known as: PRILOSEC   Take 1 capsule (20 mg total) by mouth daily.      oxyCODONE-acetaminophen 5-325 MG per tablet   Commonly known as: PERCOCET   Take 2 tablets by mouth every 6 (six) hours as needed for pain.      polyethylene glycol packet   Commonly known as: MIRALAX / GLYCOLAX   Take 17 g by mouth daily.      promethazine 50 MG tablet   Commonly known as: PHENERGAN   Take 0.5 tablets (25 mg total) by mouth every 6 (six) hours as needed for nausea.      vancomycin 1 GM/200ML Soln   Commonly known as: VANCOCIN   Inject 200 mLs (1,000 mg total) into the vein every 12 (twelve) hours. -for 8 weeks from 01/15/12             BRIEF HPI:  See H&P, Labs, Consult and Test reports for all details in brief, 61 year-old female with previous history of chronic low back pain and history of hepatitis B being treated more than 20 years ago presents to the ER because of sudden onset of loss of her vision in the left eye when she woke up one day prior to admission.She did not have any pain. She claimed that she had some nausea, vomiting and diarrhea a week prior to admission as well. She was then admitted  to the hospitalist service for further evaluation and treatment. For further details please see the history and physical that was done on admission.   CONSULTATIONS:   cardiology,opthalmology, Neurosurgery, Interventional Radiology and ID  PERTINENT RADIOLOGIC STUDIES: Dg Chest 2 View  01/04/2012  *RADIOLOGY REPORT*  Clinical Data: Pneumonia.  CHEST - 2 VIEW  Comparison: 12/31/2011  Findings: Continued areas of consolidation throughout the left lung, most confluent in the left upper lobe.  Patchy right upper lobe opacities are again noted.  Biapical scarring.  Heart is normal size.  Bilateral pleural effusions, new since prior study. Increased since prior study.  IMPRESSION: Continued multi focal airspace opacities, likely pneumonia. Enlarging bilateral effusions.  Original Report Authenticated By: Cyndie Chime, M.D.   Dg Chest 2 View  12/30/2011  *RADIOLOGY REPORT*  Clinical Data: Weakness, nausea, vomiting and diarrhea.  CHEST - 2 VIEW  Comparison: None.  Findings: The lungs are well-aerated.  There is dense airspace consolidation  involving much of the left upper lobe, with diffuse air bronchograms and mild left apical sparing.  This is compatible with significant focal pneumonia.  There also appears to be a 5.2 x 3.9 x 3.7 cm mass posteriorly near the left hilum.  This raises concern for malignancy, though the position of the mass suggests against associated postobstructive pneumonia.  In addition, there is question of a 1.8 cm nodule at the right midlung zone, with adjacent smaller airspace opacities, and a possible 1.9 cm nodule at the left midlung zone.  There is no evidence of pneumothorax.  The heart is normal in size; the mediastinal contour is within normal limits.  No acute osseous abnormalities are seen.  IMPRESSION:  1.  Dense left upper lobe pneumonia noted, with associated air bronchograms. 2.  Apparent 5.2 x 3.9 x 3.7 cm mass posteriorly near the left hilum, raising concern for malignancy; the position of the mass suggests against associated postobstructive pneumonia. 3.  Question of 1.9 cm nodule at the left midlung zone, and a 1.8 cm nodule at the right midlung zone, with adjacent smaller airspace opacities at the right mid lung.  These results were called by telephone on 12/30/2011  at  09:45 p.m. to  Dr. Quita Skye, who verbally acknowledged these results.  Original Report Authenticated By: Tonia Ghent, M.D.   Dg Sacrum/coccyx  12/30/2011  *RADIOLOGY REPORT*  Clinical Data: Lower back pain and chronic sacrococcygeal pain.  SACRUM AND COCCYX - 2+ VIEW  Comparison: None.  Findings: The frontal views are suboptimal due to overlying contrast and bowel gas.  There is no evidence of fracture or dislocation along the sacrum or coccyx on the lateral view.  Mild degenerative change is noted at the upper lumbar spine, with associated endplate sclerosis.  Mild facet disease is noted at the lower lumbar spine.  Residual contrast is noted within the large bowel.  Clips are seen overlying the right hemipelvis.  IMPRESSION: No  evidence of fracture or dislocation along the sacrum or coccyx; evaluation suboptimal due to overlying structures.  Original Report Authenticated By: Tonia Ghent, M.D.   Ct Chest W Contrast  01/02/2012  **ADDENDUM** CREATED: 01/02/2012 15:54:04  On review of the CT on 01/02/2012, a nondisplaced fracture involving the left twelfth rib is noted (images 67 and 68).  There is some associated callus consistent with a nonacute fracture.  **END ADDENDUM** SIGNED BY: Gerrianne Scale, M.D.    12/31/2011  *RADIOLOGY REPORT*  Clinical Data: Cough.  Left lung mass.  CT CHEST WITH  CONTRAST  Technique:  Multidetector CT imaging of the chest was performed following the standard protocol during bolus administration of intravenous contrast.  Contrast: 70mL OMNIPAQUE IOHEXOL 300 MG/ML IJ SOLN  Comparison: Abdominal CT and chest radiographs 12/30/2011.  Findings: There is dense air space disease with air bronchograms throughout the left upper lobe.  There is mild associated volume loss.  There are patchy airspace opacities within the superior segment of the left lower lobe, the right upper lobe and the right middle lobe.  There is a focal subpleural nodular density posteriorly in the left lower lobe on image 41, measuring 2.9 x 1.3 cm.  No enlarged mediastinal or hilar lymph nodes are identified.  There is no endobronchial lesion.  A small pleural effusion is present on the left.  There is no significant right pleural effusion or pericardial effusion. Although not specifically performed to evaluate for pulmonary embolism, the pulmonary arteries appear satisfactorily opacified and there is no evidence for that on this examination.  The visualized upper abdomen appears unremarkable.  There are no suspicious osseous findings.  IMPRESSION:  1.  Dense left upper lobe air space disease with air bronchograms and scattered air space opacities throughout the additional lobes as described most consistent with multilobar pneumonia. 2.  There  is a focal subpleural nodular density in the left lower lobe which is nonspecific.  This could reflect an area of inflammation or potentially a pulmonary infarct.  There is no specific evidence of pulmonary embolism on this non dedicated examination. 3.  No endobronchial lesion or lymphadenopathy. 4.  Small left pleural effusion.  Clinical correlation and close radiographic follow-up following appropriate treatment for pneumonia are necessary to exclude neoplasm.  Original Report Authenticated By: Gerrianne Scale, M.D.   Ct Guided Abscess Drain  01/07/2012  *RADIOLOGY REPORT*  Clinical Data: Epidural abscess, status post surgical drainage. Residual left paraspinous iliopsoas abscess  CT FLUOROSCOPY GUIDED LEFT ILIOPSOAS ABSCESS DRAINAGE  Date:  01/07/2012 14:20:00  Radiologist:  M. Ruel Favors, M.D.  Medications:  Versed and Fentanyl for conscious sedation  Guidance:  CT fluoroscopy  Fluoroscopy time:  19 seconds  Sedation time:  25 minutes  Contrast volume:  None.  Complications:  No immediate  PROCEDURE/FINDINGS:  Informed consent was obtained from the patient following explanation of the procedure, risks, benefits and alternatives. The patient understands, agrees and consents for the procedure. All questions were addressed.  A time out was performed.  Maximal barrier sterile technique utilized including caps, mask, sterile gowns, sterile gloves, large sterile drape, hand hygiene, and betadine  The previous imaging was reviewed.  The left iliopsoas abscess identified.  The patient was positioned left decubitus. Noncontrast localization CT performed.  The left iliopsoas abscess was localized.  Utilizing CT fluoroscopy.  a 17 gauge 11.8 cm access needle was advanced from a right posterior paraspinous approach into the fluid collection.  Syringe aspiration yielded exudative fluid.  Samples sent for Gram stain and culture.  Guide wire advanced.  Tract dilatation performed to insert a 10-French drain.  Drainage  catheter position confirmed in the abscess with CT.  Syringe aspiration yielded 20 ml exudative fluid.  Catheter secured with a Prolene suture and connects to external suction bulb.  Sterile dressing applied.  No immediate complication.  The patient tolerated the procedure well.  IMPRESSION: Successful CT fluoroscopy guided 10-French left iliopsoas abscess drain insertion.  Sample sent for Gram stain culture.  Original Report Authenticated By: Judie Petit. Ruel Favors, M.D.   Mr [name] Wo  Contrast  01/02/2012  *RADIOLOGY REPORT*  Clinical Data:  Swollen painful left eye with decreased vision. Left eye blindness.  MRI HEAD AND ORBITS WITHOUT AND WITH CONTRAST  Technique:  Multiplanar, multiecho pulse sequences of the brain and surrounding structures were obtained without and with intravenous contrast. Multiplanar, multiecho pulse sequences of the orbits and surrounding structures were obtained including fat saturation techniques, before and after intravenous contrast administration.  Contrast: 13 ml Multihance IV  Comparison:  CT 01/01/2012  MRI HEAD  Findings:  Negative for acute infarct.  No significant chronic ischemia.  Negative for intracranial mass or hemorrhage. Ventricles are normal in size.  Paranasal sinuses are clear.  Extensive inflammation is present in the left orbit with proptosis. There is edema of the sclera extending into the orbital fat.  See separate report of the orbit.  The diffusion weighted imaging is positive throughout the sclera suggesting acute infection.  Postcontrast imaging of the brain reveals normal enhancement.  No evidence of brain abscess or meningeal thickening.  IMPRESSION: No significant intracranial infection.   Left orbital edema.  MRI ORBITS  Findings: Extensive orbital edema is present on the left.  There is proptosis which has progressed from the prior study yesterday. There is a thickened shaggy sclera on the left globe which enhances diffusely.  There is considerable  enhancement surrounding the distal optic nerve and surrounding   intraconal orbital fat.  This has progressed considerably from the CT.  There is also edema and enhancement in the subcutaneous tissues of the eye lid and surrounding soft tissues.  Axial postcontrast images raise the possibility of a small abscess in the superior medial quadrant of the left orbit.  However, I believe this is some fat with surrounding inflammation based on the T2 and coronal T1  images.  I also considered  the possibility septic thrombophlebitis of the superior orbital vein however I do not think this is present at this time.  The cavernous sinus appears symmetric and normal bilaterally.  No significant sinusitis is present.  The right orbit is normal.  IMPRESSION: Progression of left orbital edema since yesterday.  There is thickening with shaggy enhancement of the sclera of the eye on the left.  There is enhancement in the intraconal orbital fat especially around the optic nerve.  No definite abscess is identified at this time.  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  I discussed the findings by telephone with Dr. Daiva Eves on 01/02/2012 at 1500 hours.  I have been unable to reach Dr. Randon Goldsmith who is not on call.  Original Report Authenticated By: Camelia Phenes, M.D.   Mr Cervical Spine W Wo Contrast  01/04/2012  *RADIOLOGY REPORT*  Clinical Data:  Severe pain.  Question epidural abscess.  MRI CERVICAL, THORACIC AND LUMBAR SPINE WITHOUT AND WITH CONTRAST  Technique:  Multiplanar and multiecho pulse sequences of the cervical spine, to include the craniocervical junction and cervicothoracic junction, and thoracic and lumbar spine, were obtained without and with intravenous contrast.  Contrast: 13mL MULTIHANCE GADOBENATE DIMEGLUMINE 529 MG/ML IV SOLN  Comparison:  CT abdomen and pelvis 12/30/2011 and CT chest 12/31/2011.  MRI CERVICAL SPINE  Findings:  Patient motion somewhat degrades study.  Vertebral body height, signal and  alignment are maintained.  There is no epidural abscess.  No evidence of diskitis/osteomyelitis.  Cervical cord signal is normal and the craniocervical junction is normal. Paraspinous structures are unremarkable.  C2-3:  Negative.  C3-4:  Mild disc bulging uncovertebral disease.  Central canal is  open.  Foramina are mildly narrowed.  C4-5:  Facet arthropathy and a mild disc bulge.  Central canal and foramina are open.  C5-6:  Disc bulge effaces the ventral thecal sac and causes some deformity of the cord.  Moderate bilateral foraminal narrowing is present.  C6-7:  Disc bulge contacts the cord without deforming it.  There is mild to moderate left foraminal narrowing the right foramen open.  C7-T1:  Negative.  IMPRESSION:  1.  Negative for epidural abscess or other infectious process. 2.  Degenerative disease most notable at C5-6 where a disc bulge causes some deformity of the ventral cord and there is moderate bilateral foraminal narrowing.  MRI THORACIC SPINE  Findings: Vertebral body height, signal and alignment are maintained.  There is no epidural abscess and no evidence of diskitis/osteomyelitis.  The thoracic cord appears normal. Paraspinous structures demonstrate extensive airspace disease on the left.  Patchy airspace disease in the right upper lobe is also noted.  There are left worse than right pleural effusions.  No disc bulge or protrusion is identified.  Central canal and foramina are open at all levels.  IMPRESSION:  1.  Negative for epidural abscess or diskitis/osteomyelitis. 2.  Left much worse than right airspace disease consistent with pneumonia.  Associated left greater than right pleural effusions also identified.  MRI LUMBAR SPINE  Findings: There is fluid in the disc interspace at L3-4 with edema and enhancement throughout the L3 and L4 vertebral bodies.  Also seen is marked marrow edema about the right L4-5 facet with post contrast enhancement.  A large epidural abscess is identified posterior  to the thecal sac extending from the inferior aspect of L4 to the S1-2 level.  The abscess measures approximately 5.6 cm cranial-caudal by up to 1.6 cm transverse by 1.1 cm AP.  There is mass effect on the thecal sac, worst at L5-S1 where the sac is markedly compressed.  Given its posterior position, the abscess likely originates from the right L4-5 facet.  There is also a left psoas abscess which is incompletely visualized and measures approximately 2.2 cm transverse by 2.7 cm AP by 6 cm cranial- caudal.  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  There is no fracture or subluxation of the lumbar spine.  L1-2:  Disc bulge eccentric to the right without central canal stenosis.  There is some narrowing of the right lateral recess and foramen.  L2-3:  Disc bulge and ligamentum flavum thickening. There appears to be a small extruded disc fragment in the left lateral recess which could encroach on the descending left L3 root.  Finding could be due to a tiny epidural abscess although this is felt less likely.  L3-4:  No disc bulge or protrusion.  L4-5:  Disc bulge is identified.  No central canal stenosis due to disc.  Foramina are mildly narrowed.  L5-S1:  Negative for disc bulge or protrusion.  IMPRESSION:  1.  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  Large and posterior epidural abscess compresses the thecal sac from the L4 level to S1-2 as described above.  Also seen is a large left psoas abscess. Critical Value/emergent results were called by telephone at the time of interpretation on 01/04/2012  at 2:10 p.m.  to  Dr. Luciana Axe, who verbally acknowledged these results. 2.  Likely extruded disc fragment in the left lateral recess at L2- 3 could impact the descending left L3 root.  Original Report Authenticated By: Bernadene Bell. Maricela Curet, M.D.   Mr Thoracic Spine W Wo Contrast  01/04/2012  *RADIOLOGY REPORT*  Clinical Data:  Severe pain.  Question  epidural abscess.  MRI CERVICAL, THORACIC AND LUMBAR SPINE WITHOUT AND WITH CONTRAST  Technique:  Multiplanar and multiecho pulse sequences of the cervical spine, to include the craniocervical junction and cervicothoracic junction, and thoracic and lumbar spine, were obtained without and with intravenous contrast.  Contrast: 13mL MULTIHANCE GADOBENATE DIMEGLUMINE 529 MG/ML IV SOLN  Comparison:  CT abdomen and pelvis 12/30/2011 and CT chest 12/31/2011.  MRI CERVICAL SPINE  Findings:  Patient motion somewhat degrades study.  Vertebral body height, signal and alignment are maintained.  There is no epidural abscess.  No evidence of diskitis/osteomyelitis.  Cervical cord signal is normal and the craniocervical junction is normal. Paraspinous structures are unremarkable.  C2-3:  Negative.  C3-4:  Mild disc bulging uncovertebral disease.  Central canal is open.  Foramina are mildly narrowed.  C4-5:  Facet arthropathy and a mild disc bulge.  Central canal and foramina are open.  C5-6:  Disc bulge effaces the ventral thecal sac and causes some deformity of the cord.  Moderate bilateral foraminal narrowing is present.  C6-7:  Disc bulge contacts the cord without deforming it.  There is mild to moderate left foraminal narrowing the right foramen open.  C7-T1:  Negative.  IMPRESSION:  1.  Negative for epidural abscess or other infectious process. 2.  Degenerative disease most notable at C5-6 where a disc bulge causes some deformity of the ventral cord and there is moderate bilateral foraminal narrowing.  MRI THORACIC SPINE  Findings: Vertebral body height, signal and alignment are maintained.  There is no epidural abscess and no evidence of diskitis/osteomyelitis.  The thoracic cord appears normal. Paraspinous structures demonstrate extensive airspace disease on the left.  Patchy airspace disease in the right upper lobe is also noted.  There are left worse than right pleural effusions.  No disc bulge or protrusion is  identified.  Central canal and foramina are open at all levels.  IMPRESSION:  1.  Negative for epidural abscess or diskitis/osteomyelitis. 2.  Left much worse than right airspace disease consistent with pneumonia.  Associated left greater than right pleural effusions also identified.  MRI LUMBAR SPINE  Findings: There is fluid in the disc interspace at L3-4 with edema and enhancement throughout the L3 and L4 vertebral bodies.  Also seen is marked marrow edema about the right L4-5 facet with post contrast enhancement.  A large epidural abscess is identified posterior to the thecal sac extending from the inferior aspect of L4 to the S1-2 level.  The abscess measures approximately 5.6 cm cranial-caudal by up to 1.6 cm transverse by 1.1 cm AP.  There is mass effect on the thecal sac, worst at L5-S1 where the sac is markedly compressed.  Given its posterior position, the abscess likely originates from the right L4-5 facet.  There is also a left psoas abscess which is incompletely visualized and measures approximately 2.2 cm transverse by 2.7 cm AP by 6 cm cranial- caudal.  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  There is no fracture or subluxation of the lumbar spine.  L1-2:  Disc bulge eccentric to the right without central canal stenosis.  There is some narrowing of the right lateral recess and foramen.  L2-3:  Disc bulge and ligamentum flavum thickening. There appears to be a small extruded disc fragment in the left lateral recess  which could encroach on the descending left L3 root.  Finding could be due to a tiny epidural abscess although this is felt less likely.  L3-4:  No disc bulge or protrusion.  L4-5:  Disc bulge is identified.  No central canal stenosis due to disc.  Foramina are mildly narrowed.  L5-S1:  Negative for disc bulge or protrusion.  IMPRESSION:  1.  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  Large and posterior epidural abscess  compresses the thecal sac from the L4 level to S1-2 as described above.  Also seen is a large left psoas abscess. Critical Value/emergent results were called by telephone at the time of interpretation on 01/04/2012  at 2:10 p.m.  to  Dr. Luciana Axe, who verbally acknowledged these results. 2.  Likely extruded disc fragment in the left lateral recess at L2- 3 could impact the descending left L3 root.  Original Report Authenticated By: Bernadene Bell. Maricela Curet, M.D.   Mr Lumbar Spine W Wo Contrast  01/06/2012  *RADIOLOGY REPORT*  Clinical Data: Follow-up epidural abscess.  Status post surgical decompression.  MRI LUMBAR SPINE WITHOUT AND WITH CONTRAST  Technique:  Multiplanar and multiecho pulse sequences of the lumbar spine were obtained without and with intravenous contrast.  Contrast: 14mL MULTIHANCE GADOBENATE DIMEGLUMINE 529 MG/ML IV SOLN  Comparison: Prior study 01/04/2012.  Findings: There are surgical changes related to recent L5-S1 wide decompressive laminectomy.  Evacuation of prior epidural abscess is noted with a postop fluid collection in the laminectomy defect area.  Persistent abnormal epidural fluid collections with peripheral enhancement extending from the top of L3 down to L5 consistent with persistent areas of abscess.  There is also a persistent abscess emanating from the right L4-5 facet joint.  The left psoas abscess is slightly larger.  It measures 3.6 x 2.9 cm.  Stable changes of diskitis and osteomyelitis at L3-4.  Stable degenerative disc disease.  IMPRESSION:  1.  Postoperative changes at L5-S1 with wide decompressive laminectomy and evacuation of posterior epidural abscess.  A postoperative fluid collection is noted in the laminectomy defect area. 2.  Persistent areas of small epidural abscesses extending from the top of L3 down to L5. 3.  Persistent diskitis and osteomyelitis at L3-4. 4.  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  5.  Enlarging left psoas abscess.  This was made a call report.  Original Report Authenticated By: P. Loralie Champagne, M.D.   Mr Lumbar Spine W Wo Contrast  01/04/2012  *RADIOLOGY REPORT*  Clinical Data:  Severe pain.  Question epidural abscess.  MRI CERVICAL, THORACIC AND LUMBAR SPINE WITHOUT AND WITH CONTRAST  Technique:  Multiplanar and multiecho pulse sequences of the cervical spine, to include the craniocervical junction and cervicothoracic junction, and thoracic and lumbar spine, were obtained without and with intravenous contrast.  Contrast: 13mL MULTIHANCE GADOBENATE DIMEGLUMINE 529 MG/ML IV SOLN  Comparison:  CT abdomen and pelvis 12/30/2011 and CT chest 12/31/2011.  MRI CERVICAL SPINE  Findings:  Patient motion somewhat degrades study.  Vertebral body height, signal and alignment are maintained.  There is no epidural abscess.  No evidence of diskitis/osteomyelitis.  Cervical cord signal is normal and the craniocervical junction is normal. Paraspinous structures are unremarkable.  C2-3:  Negative.  C3-4:  Mild disc bulging uncovertebral disease.  Central canal is open.  Foramina are mildly narrowed.  C4-5:  Facet arthropathy and a mild disc bulge.  Central canal  and foramina are open.  C5-6:  Disc bulge effaces the ventral thecal sac and causes some deformity of the cord.  Moderate bilateral foraminal narrowing is present.  C6-7:  Disc bulge contacts the cord without deforming it.  There is mild to moderate left foraminal narrowing the right foramen open.  C7-T1:  Negative.  IMPRESSION:  1.  Negative for epidural abscess or other infectious process. 2.  Degenerative disease most notable at C5-6 where a disc bulge causes some deformity of the ventral cord and there is moderate bilateral foraminal narrowing.  MRI THORACIC SPINE  Findings: Vertebral body height, signal and alignment are maintained.  There is no epidural abscess and no evidence of diskitis/osteomyelitis.  The thoracic cord appears normal.  Paraspinous structures demonstrate extensive airspace disease on the left.  Patchy airspace disease in the right upper lobe is also noted.  There are left worse than right pleural effusions.  No disc bulge or protrusion is identified.  Central canal and foramina are open at all levels.  IMPRESSION:  1.  Negative for epidural abscess or diskitis/osteomyelitis. 2.  Left much worse than right airspace disease consistent with pneumonia.  Associated left greater than right pleural effusions also identified.  MRI LUMBAR SPINE  Findings: There is fluid in the disc interspace at L3-4 with edema and enhancement throughout the L3 and L4 vertebral bodies.  Also seen is marked marrow edema about the right L4-5 facet with post contrast enhancement.  A large epidural abscess is identified posterior to the thecal sac extending from the inferior aspect of L4 to the S1-2 level.  The abscess measures approximately 5.6 cm cranial-caudal by up to 1.6 cm transverse by 1.1 cm AP.  There is mass effect on the thecal sac, worst at L5-S1 where the sac is markedly compressed.  Given its posterior position, the abscess likely originates from the right L4-5 facet.  There is also a left psoas abscess which is incompletely visualized and measures approximately 2.2 cm transverse by 2.7 cm AP by 6 cm cranial- caudal.  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  There is no fracture or subluxation of the lumbar spine.  L1-2:  Disc bulge eccentric to the right without central canal stenosis.  There is some narrowing of the right lateral recess and foramen.  L2-3:  Disc bulge and ligamentum flavum thickening. There appears to be a small extruded disc fragment in the left lateral recess which could encroach on the descending left L3 root.  Finding could be due to a tiny epidural abscess although this is felt less likely.  L3-4:  No disc bulge or protrusion.  L4-5:  Disc bulge is identified.  No central canal stenosis due to disc.  Foramina  are mildly narrowed.  L5-S1:  Negative for disc bulge or protrusion.  IMPRESSION:  1.  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  Large and posterior epidural abscess compresses the thecal sac from the L4 level to S1-2 as described above.  Also seen is a large left psoas abscess. Critical Value/emergent results were called by telephone at the time of interpretation on 01/04/2012  at 2:10 p.m.  to  Dr. Luciana Axe, who verbally acknowledged these results. 2.  Likely extruded disc fragment in the left lateral recess at L2- 3 could impact the descending left L3 root.  Original Report Authenticated By: Bernadene Bell. Maricela Curet, M.D.   Ct Abdomen Pelvis W Contrast  01/14/2012  *  RADIOLOGY REPORT*  Clinical Data: Follow up of left-sided psoas abscess.  Status post drain placement 1 week ago.  CT ABDOMEN AND PELVIS WITH CONTRAST  Technique:  Multidetector CT imaging of the abdomen and pelvis was performed following the standard protocol during bolus administration of intravenous contrast.  Contrast: OMNIPAQUE IOHEXOL 300 MG/ML IJ SOLN  Comparison: 01/05/2012  Findings: Minimal dependent left base atelectasis.  Normal heart size.  Mild volume loss in the lingula.  Small left pleural effusion is decreased.  Suspect minimal complex left sided pleural component versus an area of atelectasis on the first image of the study.  The right pleural effusion is resolved.  Normal heart size.  Focal steatosis adjacent the falciform ligament.  Normal spleen. Underdistended distal stomach.  Normal pancreas, gallbladder, biliary tract, adrenal glands, kidneys.No retroperitoneal or retrocrural adenopathy.  The left-sided psoas drain remains in place.  The left-sided psoas abscess/fluid collection is resolved.  Mild residual intramuscular edema.  The cecum is positioned within the pelvis.  Bowel loops are otherwise within normal limits. No ascites.  No pneumatosis or free intraperitoneal  air.  No pelvic adenopathy.  Pelvic floor laxity. Normal urinary bladder.  Hysterectomy.  No adnexal mass.  No significant free fluid.  Gas in the subcutaneous anterior abdomen may be iatrogenic.  Lumbosacral laminectomy.  Persistent gas along the anterior aspect of the thecal space on image 39, likely postoperative.  The spine drain has been removed.  There is edema and ill-defined fluid without well-defined fluid collection about the lumbosacral operative site.  Convex right lumbar spine curvature.  IMPRESSION:  1.  Placement of a left psoas drain, with resolution of fluid collection/abscess. 2.  Improved appearance of the inferior chest.  Residual small left pleural effusion.  Minimal loculated complex fluid versus atelectasis, incompletely imaged. 3.  Ill-defined postoperative fluid about the lumbosacral spine. Removal of surgical drain without evidence of well-defined paraspinal fluid collection.  Original Report Authenticated By: Consuello Bossier, M.D.   Ct Abdomen Pelvis W Contrast  01/05/2012  *RADIOLOGY REPORT*  Clinical Data: Severe abdominal pain.  Back pain.  Status post back surgery.  Abdominal drain.  Evaluate psoas muscle abscess.  CT ABDOMEN AND PELVIS WITH CONTRAST  Technique:  Multidetector CT imaging of the abdomen and pelvis was performed following the standard protocol during bolus administration of intravenous contrast.  Contrast: OMNIPAQUE IOHEXOL 300 MG/ML IJ SOLN  Comparison: MRI of the lumbar spine without and with contrast 01/04/2012.The CT of the abdomen and pelvis 12/30/2011.  Findings: A 2.7 x 3.9 x 9.1 7 meters peripherally enhancing collection is present in the left psoas muscle, compatible with an abscess.  There are small extraperitoneal lymph nodes on the left.  The patient is status post L5 and S1 laminectomy.  There is gas within the operative site.  A surgical drain is in place.  A diffuse disc herniation is present L4-5.  Gas is present within an inferiorly extruded disc  at L2-3.  Fluid or soft tissue is present within the presacral space.  Moderate bilateral pleural effusions are present.  There is associated dependent atelectasis.  The lungs are otherwise clear. The heart size is normal.  Note is made of a pectus excavatum deformity.  The liver and spleen are within normal limits.  The common bile duct and gallbladder are normal.  The adrenal glands and kidneys are within normal limits.  Atherosclerotic calcifications are present within the aorta branch vessels.  There is no aneurysm.  Fluid or soft  tissue is present within the presacral space. Diverticular changes are present within the rectosigmoid colon.  No focal inflammation is evident.  The remainder of the colon is within normal limits.  The appendix is not discretely visualized.  The bone windows demonstrate marked dextroconvex curvature lumbar spine, centered at L3.  A hemangioma is present at L1.  No other focal lytic or blastic lesions are evident.  IMPRESSION:  1.  9 cm peripherally enhancing fluid collection in the left psoas muscle. 2.  Fluid or soft tissue within the presacral space.  This likely represents reactive fluid. 3.  Residual gas is present in the paraspinal soft tissues with surgical drains in place.  4.  Fluid or soft tissue in the presacral space.  This likely represents reactive fluid. 5.  Bilateral pleural effusions and associated atelectasis.  Original Report Authenticated By: Jamesetta Orleans. MATTERN, M.D.   Ct Abdomen Pelvis W Contrast  12/30/2011  *RADIOLOGY REPORT*  Clinical Data: Emesis and back pain.  Leukocytosis.  Calculated GFR is 37.  CT ABDOMEN AND PELVIS WITHOUT CONTRAST  Technique:  Multidetector CT imaging of the abdomen and pelvis was performed following the standard protocol without intravenous contrast.  Comparison: None.  Findings: The CT topogram raises concern for densities in the left hilar region.  This area was not imaged on the cross-sectional imaging.  The lung bases  demonstrate air bronchograms and consolidation at the lingula.  There are a few patchy parenchymal densities in the posterior left lower lobe which could be related to volume loss or inflammatory changes.  There is a small amount of volume loss at the base of the right middle lobe. No evidence of free air.  Normal appearance of liver with a focal fat near the falciform ligament.  The portal venous system is patent.  Gallbladder has a normal appearance.  Normal appearance of the spleen, pancreas and adrenal tissue.  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  No evidence for bowel dilatation.  There is a surgical clip along the cecum suggestive for an appendectomy.  The uterus appears to be surgically absent.  No significant free fluid or lymphadenopathy in the pelvis.  Scoliosis of the lumbar spine. Scoliosis apex is at L3 with severe degenerative changes at L2-L3 and L3-L4.   There is mild heterogeneity along the anterior aspect of the right kidney on sequence two, image 45.  This area is grossly normal on the delayed images and pyelonephritis is thought to be unlikely.  IMPRESSION: Consolidation at the base of the lingula and the CT topogram raises concern for disease in the left hilum and left upper lung.  Few patchy parenchymal densities in the left lower lobe.  The findings raise concern for an inflammatory or infectious etiology in the left lung.  Recommend dedicated chest imaging for further characterization.  No acute abnormalities within the abdomen or pelvis.  The right kidney is slightly malrotated but no evidence for hydronephrosis.  Scoliosis with degenerative changes.  Original Report Authenticated By: Richarda Overlie, M.D.   Dg Lumbar Spine 1 View  01/04/2012  *RADIOLOGY REPORT*  Clinical Data: Epidural abscess.  LUMBAR SPINE - 1 VIEW  Comparison: MRI dated 01/04/2012  Findings: Single lateral view demonstrates instruments at the L5-S1 level.  IMPRESSION: Instruments at L5-S1.  Original  Report Authenticated By: Gwynn Burly, M.D.   Ct Maxillofacial W/cm  01/09/2012  *RADIOLOGY REPORT*  Clinical Data: Left periorbital cellulitis and swelling.  CT MAXILLOFACIAL WITH CONTRAST  Technique:  Multidetector CT imaging  of the maxillofacial structures was performed with intravenous contrast. Multiplanar CT image reconstructions were also generated.  Contrast: 75mL OMNIPAQUE IOHEXOL 300 MG/ML IJ SOLN  Comparison: 01/07/2012 and multiple previous  Findings: There has been interval surgery.  Hyperdense structure covers the outer surface of the globe.  The globe appears to be filled with heterogeneous low density material probably representing  packing.  Outer structures of the globe appear swollen and inflammed.  Post septal orbital fat inflammation remains evident.  Some inflammation is seen along the left optic nerve as well.  I cannot identify  any intra orbital undrained fluid collection. No evidence of intracranial extension of inflammatory disease.  Cavernous sinus regions appear symmetric.  IMPRESSION: Apparent surgical debridement of the left globe.  The globe appears to be filled with packing material.  Persistent inflammation of the outer layers of the globe.  Inflammatory type edema within the post- septal orbital fat but no evidence of undrained orbital fluid collection.  Original Report Authenticated By: Thomasenia Sales, M.D.   Dg Shoulder Left  01/02/2012  *RADIOLOGY REPORT*  Clinical Data: Left shoulder pain for 5 days.  No known injury.  LEFT SHOULDER - 2+ VIEW  Comparison: Chest radiographs 12/30/2011.  Chest CT 12/31/2011.  Findings: The mineralization and alignment are normal.  There is no evidence of acute fracture or dislocation.  Mild acromioclavicular degenerative changes appear stable.  The subacromial space is preserved.  The left upper lobe air space disease has mildly improved compared with the recent studies.  On review of the CT performed 2 days ago, I note there is a  nondisplaced fracture of the left 12th rib posteriorly.  This was not described on that report but does not have an acute appearance.  IMPRESSION:  1.  No acute shoulder abnormalities identified.  There are mild acromioclavicular degenerative changes.  2.  Improving left upper lobe air space disease consistent with improving pneumonia.  This may contribute to the patient's shoulder pain.  3.  Nondisplaced subacute fracture of the left 12th rib (on recent CT).  Original Report Authenticated By: Gerrianne Scale, M.D.   Ct Maxillofacial Wo Cm  01/01/2012  *RADIOLOGY REPORT*  Clinical Data: Left eye swollen with drainage.  Headache.  CT MAXILLOFACIAL WITHOUT CONTRAST  Technique:  Multidetector CT imaging of the maxillofacial structures was performed. Multiplanar CT image reconstructions were also generated.  Comparison: None.  Findings: Soft tissue swelling of the left eye lid.  The globe is normal.  No edema or abscess is present in the orbit.  Findings are most compatible with superficial cellulitis.  Frontal sinuses are hypoplastic.  Paranasal sinuses are   clear without evidence of sinusitis.  No acute bony changes.  IMPRESSION: Superficial cellulitis of the eye lid on the left.  Negative for orbital cellulitis or abscess.  Original Report Authenticated By: Camelia Phenes, M.D.   Mr Orbits Wo/w Cm  01/07/2012  *RADIOLOGY REPORT*  Clinical Data: Progressive left eye swelling.  MRI ORBITS WITHOUT AND WITH CONTRAST  Technique:  Multiplanar and multiecho pulse sequences of the orbits and surrounding structures were obtained including fat saturation techniques, before and after intravenous contrast administration.  Contrast:  5 ml MultiHance was utilized (half-dose in an attempt to limit contrast).  Comparison: 01/02/2012 MR.  01/01/2011 CT.  Findings: Progressive left-sided proptosis with diffuse inflammation of the globe and surrounding soft tissue.  Interval enlargement of the left extraocular muscles most  notable left medial rectus (consistent with infectious involvement).  Peripheral extraconal  thin fluid collection (maximal thickness 3.6 mm) most notable superiorly and laterally suggestive of an abscess. Retrobulbar fatty reticulation.  Significant elongation of the left optic nerve which demonstrates restricted motion suggesting infection and / or ischemia/infarction.  Preseptal soft tissue swelling extends along the left cheek.  No definitive findings of cavernous sinus thrombosis. Patency of the left superior ophthalmic vein cannot be confirmed.  No brain parenchymal enhancing lesion or abnormal meningeal enhancement.  IMPRESSION: Progressive left-sided proptosis with diffuse inflammation of the globe and surrounding soft tissue.  Peripheral left-sided extraconal thin fluid collection (maximal thickness 3.6 mm) most notable superiorly and laterally suggestive of an abscess.  Significant elongation of the left optic nerve which demonstrates restricted motion suggesting infection and / or ischemia/infarction.  No definitive findings of cavernous sinus thrombosis. Patency of the left superior ophthalmic vein cannot be confirmed.  No brain parenchymal enhancing lesion or abnormal meningeal enhancement.  Images and cased discussed with Dr. Randon Goldsmith ophthalmology service.  Original Report Authenticated By: Fuller Canada, M.D.   Mr Orbits Wo/w Cm  01/02/2012  *RADIOLOGY REPORT*  Clinical Data:  Swollen painful left eye with decreased vision. Left eye blindness.  MRI HEAD AND ORBITS WITHOUT AND WITH CONTRAST  Technique:  Multiplanar, multiecho pulse sequences of the brain and surrounding structures were obtained without and with intravenous contrast. Multiplanar, multiecho pulse sequences of the orbits and surrounding structures were obtained including fat saturation techniques, before and after intravenous contrast administration.  Contrast: 13 ml Multihance IV  Comparison:  CT 01/01/2012  MRI HEAD  Findings:   Negative for acute infarct.  No significant chronic ischemia.  Negative for intracranial mass or hemorrhage. Ventricles are normal in size.  Paranasal sinuses are clear.  Extensive inflammation is present in the left orbit with proptosis. There is edema of the sclera extending into the orbital fat.  See separate report of the orbit.  The diffusion weighted imaging is positive throughout the sclera suggesting acute infection.  Postcontrast imaging of the brain reveals normal enhancement.  No evidence of brain abscess or meningeal thickening.  IMPRESSION: No significant intracranial infection.   Left orbital edema.  MRI ORBITS  Findings: Extensive orbital edema is present on the left.  There is proptosis which has progressed from the prior study yesterday. There is a thickened shaggy sclera on the left globe which enhances diffusely.  There is considerable enhancement surrounding the distal optic nerve and surrounding   intraconal orbital fat.  This has progressed considerably from the CT.  There is also edema and enhancement in the subcutaneous tissues of the eye lid and surrounding soft tissues.  Axial postcontrast images raise the possibility of a small abscess in the superior medial quadrant of the left orbit.  However, I believe this is some fat with surrounding inflammation based on the T2 and coronal T1  images.  I also considered  the possibility septic thrombophlebitis of the superior orbital vein however I do not think this is present at this time.  The cavernous sinus appears symmetric and normal bilaterally.  No significant sinusitis is present.  The right orbit is normal.  IMPRESSION: Progression of left orbital edema since yesterday.  There is thickening with shaggy enhancement of the sclera of the eye on the left.  There is enhancement in the intraconal orbital fat especially around the optic nerve.  No definite abscess is identified at this time.  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  I  discussed the findings by telephone with Dr. Daiva Eves  on 01/02/2012 at 1500 hours.  I have been unable to reach Dr. Randon Goldsmith who is not on call.  Original Report Authenticated By: Camelia Phenes, M.D.     PERTINENT LAB RESULTS: CBC:  Basename 01/17/12 0500  WBC 6.0  HGB 8.4*  HCT 25.7*  PLT 435*   CMET CMP     Component Value Date/Time   NA 135 01/17/2012 0500   K 3.1* 01/17/2012 0500   CL 101 01/17/2012 0500   CO2 26 01/17/2012 0500   GLUCOSE 90 01/17/2012 0500   BUN 4* 01/17/2012 0500   CREATININE 0.50 01/17/2012 0500   CALCIUM 8.0* 01/17/2012 0500   PROT 5.5* 12/31/2011 1155   ALBUMIN 1.5* 12/31/2011 1155   AST 50* 12/31/2011 1155   ALT 35 12/31/2011 1155   ALKPHOS 341* 12/31/2011 1155   BILITOT 1.2 12/31/2011 1155   GFRNONAA >90 01/17/2012 0500   GFRAA >90 01/17/2012 0500    GFR Estimated Creatinine Clearance: 65.7 ml/min (by C-G formula based on Cr of 0.5). No results found for this basename: LIPASE:2,AMYLASE:2 in the last 72 hours No results found for this basename: CKTOTAL:3,CKMB:3,CKMBINDEX:3,TROPONINI:3 in the last 72 hours No components found with this basename: POCBNP:3 No results found for this basename: DDIMER:2 in the last 72 hours No results found for this basename: HGBA1C:2 in the last 72 hours No results found for this basename: CHOL:2,HDL:2,LDLCALC:2,TRIG:2,CHOLHDL:2,LDLDIRECT:2 in the last 72 hours No results found for this basename: TSH,T4TOTAL,FREET3,T3FREE,THYROIDAB in the last 72 hours No results found for this basename: VITAMINB12:2,FOLATE:2,FERRITIN:2,TIBC:2,IRON:2,RETICCTPCT:2 in the last 72 hours Coags: No results found for this basename: PT:2,INR:2 in the last 72 hours Microbiology: No results found for this or any previous visit (from the past 240 hour(s)).   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 She also claimed that she had some occasional floaters,it was initially thought that it patient had anterior uveitis however upon hospitalization she quickly worsened and then progressed to endogenous endophthalmitis. MRI of the orbits showed progression of the left orbital edema with shaggy enhancement of the sclera and enhancement of the intraconal orbital fat especially around the optic nerve. These findings were very concerning for endophthalmitis. Her antibiotics were then switched to vancomycin and ceftazidime. Infectious disease was also consulted. Dr. Clarisa Kindred from ophthalmology was also consulted, a bedside vitreous tap was done, unfortunately all cultures came back negative. It is felt that this endophthalmitis was a end organ manifestation of another infection- likely a bloodstream infection, hence blood cultures were drawn. Since the patient was complaining of back pain MRI of the spine was ordered. MRI of the lumbar spine showed discitis at L3-L4 level and at L4/L5 level, a large posterior epidural abscess was also seen from L4-S1 level. Neurosurgery was then consulted. The patient underwent a lumbar laminectomy and cord decompression. In regards to her left eye, patient unfortunately underwent Evisceration on 01/07/12. Unfortunately all her ocular cultures were negative. She continues to be followed by ophthalmology during this hospital stay, I did speak with Dr. Randon Goldsmith yesterday, current recommendations are to continue LacriLube and polymyxin ointment as noted above. She will need to followup with Dr. Randon Goldsmith within one week upon discharge from the hospital for further continued care.   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 Once her eye exam worsened and she was thought to have endogenous endophthalmitis, other sources for the primary infection was then looked at,  since she was having back pain, MRI of the spine was ordered, a discitis and epidural abscess was seen in the lumbar spine area (for details please see above). Patient was then seen in consultation by Dr. Aliene Beams of neurosurgery, who then took the patient to the OR for a lumbar laminectomy and decompression surgery. A drain placed during the initial surgery fell out on 01/06/2012. Interventional radiology was then consulted, a drain was then placed on the left psoas muscle on 01/07/2012. Patient was maintained on antibiotics -vancomycin and Fortaz per recommendation of infectious disease. Interventional radiology and neurosurgery continued to follow this patient. Patient slowly made clinical improvement. Patient underwent reimaging with a CT scan of the abdomen on 01/14/2012 to see if the psoas drain could be removed, since the CT scan showed resolution of the fluid collection/abscess, the drain was then removed on 01/15/2012. Current recommendations from infectious disease is to continue antibiotics 8 weeks from 16th of March 2013. - A PICC line has been placed, antibiotics to be discharged on- include vancomycin and Rocephin. Patient will need CBC, BMET and vancomycin trough levels drawn every Wednesday, those results will then need to be faxed or called to Dr. Ephriam Knuckles office at infectious disease clinic.  - to date all cultures continue to be negative. A TEE done during this admission did not show any evidence of vegetation.   Left sided multilobar PNA  Secondary to infection- on abx-as noted above. -She was also seen in consultation with pulmonary critical care regarding this. -Currently she is afebrile and has a normal lung exam.  Anemia -This is felt to be multifactorial with elements of critical illness and iron deficiency contributing.She will be discharged with Iron supplementation.-Currently hemoglobin and hematocrit is stable, per patient she apparently had a Colonoscopy with [facility]  around 15 months back which was reportedly normal. Will need follow up CBC, to ensure hemoglobin/hematocrit remain stable.  Diarrhea  -This is prior to admission, C. difficile PCR was negative -This is all resolved.  Maculopapular rash -This is generalized but mostly limited to her torso and her lower extremities. -Her antibiotics would not discontinued, this has now resolved with supportive measures.  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 A cystic area has now appeared on her left shoulder, this is nontender and looks more like a cyst to me. There is no overlying erythema. It has not rapidly expanded and is the same size for the past few weeks. I did discuss this with Dr. Lucianne Muss with infectious disease, his recommendations were just to monitor this, patient is already on antibiotics and does not have fever, and she is certain he does not have any clinical features in the left area that would suggest any infectious process there. She is able to move her left shoulder without any difficulty.  Deconditioning -This is secondary to prolonged hospital stay and critical illness, she has been seen by physical and occupational therapy services during this hospital stay. Initially it was thought that she may need to be transferred to a skilled nursing facility for rehabilitation, however with ongoing therapy and clinical  improvement, repeat physical therapy and occupational therapy recommendations are for her to be discharged home with home health services. Please note that this patient is also very keen on actually going home and is not wanting to be discharged to a skilled nursing facility, she is in complete agreement in going home with home health services and IV  antibiotic therapy which our case management R.N. is arranging.   TODAY-DAY OF DISCHARGE:  Subjective:   Kayci [place] today has no headache,no chest abdominal pain,no new weakness tingling or numbness, feels much better wants to go home today.   Objective:   Blood pressure 131/63, pulse 94, temperature 98.7 F (37.1 C), temperature source Oral, resp. rate 20, height 5\' 9"  (1.753 m), weight 57.1 kg (125 lb 14.1 oz), SpO2 95.00%.  Intake/Output Summary (Last 24 hours) at 01/19/12 1532 Last data filed at 01/19/12 1440  Gross per 24 hour  Intake   1430 ml  Output   2750 ml  Net  -1320 ml    Exam Awake Alert, Oriented *3, No new F.N deficits, Normal affect [place].AT,PERRAL Supple Neck,No JVD, No cervical lymphadenopathy appriciated.  Symmetrical Chest wall movement, Good air movement bilaterally, CTAB RRR,No Gallops,Rubs or new Murmurs, No Parasternal Heave +ve B.Sounds, Abd Soft, Non tender, No organomegaly appriciated, No rebound -guarding or rigidity. No Cyanosis, Clubbing or edema, No new Rash or bruise  DISPOSITION: Home with home health services either on 3/20 or 01/20/2012.   DISCHARGE INSTRUCTIONS:    Follow-up Information    Follow up with [name], MD in 2 weeks. (IN 2-3 WEEKS-HIS OFFICE WILL CALL WITH FOLLOW UP APPOINTMENT)    Contact information:   [address] [place] [address] [phone]       Follow up with Antony Contras, MD. Schedule an appointment as soon as possible for a visit in 1 week.   Contact information:   [address] [place] [address] [phone]       Follow up with Reinaldo Meeker, MD. Schedule an appointment as soon as possible for a visit in 1 week.   Contact information:   [address] [place] [address] [phone]         Total Time spent on discharge equals 60 minutes.  Signed: Dilon Lank 01/19/2012 3:32 PM

## 2012-01-19 ENCOUNTER — Encounter (HOSPITAL_COMMUNITY): Payer: Self-pay | Admitting: Neurosurgery

## 2012-01-19 MED ORDER — HEPARIN SOD (PORK) LOCK FLUSH 100 UNIT/ML IV SOLN
250.0000 [IU] | INTRAVENOUS | Status: AC | PRN
  Administered 2012-01-19: 250 [IU]

## 2012-01-19 NOTE — Progress Notes (Signed)
   CARE MANAGEMENT NOTE 01/19/2012  Patient:  BEE, MARCHIANO   Account Number:  [id number]  Date Initiated:  01/06/2012  Documentation initiated by:  Letha Cape  Subjective/Objective Assessment:   dx dehydration  admit lives alone.     Action/Plan:   pt/ot eval- rec ? snf   Anticipated DC Date:  01/19/2012   Anticipated DC Plan:  HOME W HOME HEALTH SERVICES      DC Planning Services  CM consult      [facility] Choice  HOME HEALTH   Choice offered to / List presented to:  C-1 Patient        HH arranged  HH-1 RN  HH-2 PT      [facility] agency  [facility]   Status of service:  Completed, signed off Medicare Important Message given?   (If response is "NO", the following Medicare IM given date fields will be blank) Date Medicare IM given:   Date Additional Medicare IM given:    Discharge Disposition:  HOME W HOME HEALTH SERVICES  Per UR Regulation:    If discussed at Long Length of Stay Meetings, dates discussed:   01/12/2012    Comments:  01/19/12 16:03 Letha Cape RN, BSN [phone] patient for dc to home today with hh services, patient can afford her medications as long as they are generic and on the [employer] list, and she has transportation.  01/18/12 12:08 Letha Cape RN, BSN [phone] patient is eligible of zz fund.  Patient will have HHRN with [facility].  [facility] will need to get a weekly every wed cbc, cmet, bmet and vanc trough and send it to Dr. [name], gave this information to [facility] with [facility]  Patient can not have an aide secondary to self pay.   01/17/12 16:55 Letha Cape RN, BSN [phone] patient states she would like to work with [facility] for [facility] services,  she has friends that will be able to help her out with the iv abx.  Referral made to [facility] for [facility] for iv abx.  Soc will begin 24-48 hr post discharge.  Patient states she can afford her meds if on the $4 list at [facility].  She has transportation.  Patient is eligble for zz fund.  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 Pt may need SNF.  01/06/12 9:46 Letha Cape RN, BSN [phone] patient lives alone.  Per physical therapy patient may need st snf if she feels she is unable to manage at home.  NCM will continue to follow for dc needs.

## 2012-01-19 NOTE — Progress Notes (Signed)
Physical Therapy Treatment Patient Details Name: Donna Tapia MRN: [id number] DOB: May 17, 1951 Today's Date: 01/19/2012  PT Assessment/Plan  PT - Assessment/Plan Comments on Treatment Session: Pt doing very well with mobility.  Pt ready for dc from PT standpoint. PT Plan: Equipment recommendations need to be updated Follow Up Recommendations: Home health PT Equipment Recommended: None recommended by PT PT Goals  Acute Rehab PT Goals PT Goal: Ambulate - Progress: Met  PT Treatment Precautions/Restrictions  Precautions Precautions: Fall Precaution Comments: Blind in the left eye, has eye patch cover recent eye removal. Required Braces or Orthoses: No Restrictions Weight Bearing Restrictions: No Mobility (including Balance) Transfers Sit to Stand: From chair/3-in-1;From toilet;With upper extremity assist;7: Independent Sit to Stand Details (indicate cue type and reason): no cues Stand to Sit: To bed;To toilet;With upper extremity assist;7: Independent Stand to Sit Details: no cues Ambulation/Gait Ambulation/Gait Assistance: 6: Modified independent (Device/Increase time) Ambulation/Gait Assistance Details (indicate cue type and reason): pt scanning environment appropriately.  Assist for IV only Ambulation Distance (Feet): 400 Feet Assistive device: None Gait Pattern:  (narrow base)  Dynamic Standing Balance Dynamic Standing - Balance Support: No upper extremity supported;During functional activity Dynamic Standing - Level of Assistance: 6: Modified independent (Device/Increase time) Exercise    End of Session PT - End of Session Activity Tolerance: Patient tolerated treatment well Patient left: in bed;with call bell in reach General Behavior During Session: [facility] for tasks performed Cognition: [facility] for tasks performed  [facility] 01/19/2012, 2:07 PM  [employer] PT [phone]

## 2012-01-19 NOTE — Progress Notes (Signed)
Patient D/c home via wheelchair with nurse tech Zella Ball and friend Norberto Sorenson.  Patient has PICC capped for home antibotics. Patient has eye patch to left eye area where left eye was removed.  Patient d/c with belonings.  Will continue to monitor patient. Nelda Marseille, RN

## 2012-01-19 NOTE — Progress Notes (Signed)
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  Allergies  Allergen Reactions  . Sulfur     Patient Measurements: Height: 5\' 9"  (175.3 cm) Weight: 125 lb 14.1 oz (57.1 kg) IBW/kg (Calculated) : 66.2  Adjusted Body Weight:   Vital Signs: Temp: 98.7 F (37.1 C) (03/20 1357) Temp src: Oral (03/20 1357) BP: 131/63 mmHg (03/20 1357) Pulse Rate: 94  (03/20 1357) Intake/Output from previous day: 03/19 0701 - 03/20 0700 In: 1430 [P.O.:830; IV Piggyback:600] Out: 2875 [Urine:2875] Intake/Output from this shift:    Labs:  Basename 01/17/12 0500  WBC 6.0  HGB 8.4*  PLT 435*  LABCREA --  CREATININE 0.50   Estimated Creatinine Clearance: 65.7 ml/min (by C-G formula based on Cr of 0.5).  Basename 01/19/12 1730  VANCOTROUGH 15.8  VANCOPEAK --  VANCORANDOM --  GENTTROUGH --  GENTPEAK --  GENTRANDOM --  TOBRATROUGH --  TOBRAPEAK --  TOBRARND --  AMIKACINPEAK --  AMIKACINTROU --  AMIKACIN --     Microbiology: Recent Results (from the past 720 hour(s))  URINE CULTURE     Status: Normal   Collection Time   12/31/11  4:15 AM      Component Value Range Status Comment   Specimen Description URINE, CLEAN CATCH   Final    Special Requests NONE   Final    Culture  Setup Time [id number]   Final    Colony Count NO GROWTH   Final    Culture NO GROWTH   Final    Report Status 01/01/2012 FINAL   Final   CULTURE, BLOOD (ROUTINE X 2)     Status: Normal   Collection Time   12/31/11  8:42 AM      Component Value Range Status Comment   Specimen Description BLOOD LEFT ARM   Final    Special Requests BOTTLES DRAWN AEROBIC ONLY 10CC   Final    Culture  Setup Time [id number]   Final    Culture NO GROWTH 5 DAYS   Final    Report Status 01/06/2012 FINAL   Final   CULTURE, BLOOD (ROUTINE X 2)     Status: Normal   Collection Time   12/31/11  8:48 AM      Component Value Range Status  Comment   Specimen Description BLOOD LEFT HAND   Final    Special Requests BOTTLES DRAWN AEROBIC ONLY 10CC   Final    Culture  Setup Time [id number]   Final    Culture NO GROWTH 5 DAYS   Final    Report Status 01/06/2012 FINAL   Final   CULTURE, SPUTUM-ASSESSMENT     Status: Normal   Collection Time   12/31/11  2:15 PM      Component Value Range Status Comment   Specimen Description SPUTUM   Final    Special Requests NONE   Final    Sputum evaluation     Final    Value: THIS SPECIMEN IS ACCEPTABLE. RESPIRATORY CULTURE REPORT TO FOLLOW.   Report Status 12/31/2011 FINAL   Final   CULTURE, RESPIRATORY     Status: Normal   Collection Time   12/31/11  2:15 PM      Component Value Range Status Comment   Specimen Description SPUTUM   Final    Special Requests NONE   Final    Gram Stain     Final    Value: NO  WBC SEEN     NO SQUAMOUS EPITHELIAL CELLS SEEN     FEW YEAST   Culture NORMAL OROPHARYNGEAL FLORA   Final    Report Status 01/03/2012 FINAL   Final   STOOL CULTURE     Status: Normal   Collection Time   12/31/11  2:45 PM      Component Value Range Status Comment   Specimen Description STOOL   Final    Special Requests NONE   Final    Culture     Final    Value: NO SALMONELLA, SHIGELLA, CAMPYLOBACTER, OR YERSINIA ISOLATED   Report Status 01/04/2012 FINAL   Final   CLOSTRIDIUM DIFFICILE BY PCR     Status: Normal   Collection Time   01/02/12 11:29 AM      Component Value Range Status Comment   C difficile by pcr NEGATIVE  NEGATIVE  Final   EYE CULTURE     Status: Normal   Collection Time   01/03/12  1:45 PM      Component Value Range Status Comment   Specimen Description EYE LEFT   Final    Special Requests     Final    Value: VTREOUS PT ON VANC CEFTAZ DO SENS TO ALL GROWTH INCLUDING COAG NEG STAPH PER MD   Culture NO GROWTH 7 DAYS   Final    Report Status 01/11/2012 FINAL   Final   EYE CULTURE     Status: Normal   Collection Time   01/03/12  1:45 PM      Component Value Range  Status Comment   Specimen Description EYE LEFT   Final    Special Requests     Final    Value: ANTEROR CHAMBER PT ON VANC CEFTAZ DO SENS TO ALL GROWTH INCLUDING COAG NEG STAPH PER MD   Culture NO GROWTH 7 DAYS   Final    Report Status 01/11/2012 FINAL   Final   CULTURE, BLOOD (ROUTINE X 2)     Status: Normal   Collection Time   01/03/12  8:32 PM      Component Value Range Status Comment   Specimen Description BLOOD RIGHT HAND   Final    Special Requests BOTTLES DRAWN AEROBIC ONLY 10CC   Final    Culture  Setup Time [id number]   Final    Culture NO GROWTH 5 DAYS   Final    Report Status 01/10/2012 FINAL   Final   CULTURE, BLOOD (ROUTINE X 2)     Status: Normal   Collection Time   01/03/12  8:39 PM      Component Value Range Status Comment   Specimen Description BLOOD RIGHT FOREARM   Final    Special Requests BOTTLES DRAWN AEROBIC ONLY 10CC   Final    Culture  Setup Time [id number]   Final    Culture NO GROWTH 5 DAYS   Final    Report Status 01/10/2012 FINAL   Final   CULTURE, SPUTUM-ASSESSMENT     Status: Normal   Collection Time   01/04/12  6:48 AM      Component Value Range Status Comment   Specimen Description SPUTUM   Final    Special Requests NONE   Final    Sputum evaluation     Final    Value: MICROSCOPIC FINDINGS SUGGEST THAT THIS SPECIMEN IS NOT REPRESENTATIVE OF LOWER RESPIRATORY SECRETIONS. PLEASE RECOLLECT.     CALLED TO [facility] RN 01/04/12 0800 COSTELLO B  Report Status 01/04/2012 FINAL   Final   MRSA PCR SCREENING     Status: Normal   Collection Time   01/04/12  4:20 PM      Component Value Range Status Comment   MRSA by PCR NEGATIVE  NEGATIVE  Final   CULTURE, ROUTINE-ABSCESS     Status: Normal   Collection Time   01/04/12  9:46 PM      Component Value Range Status Comment   Specimen Description ABSCESS BACK   Final    Special Requests PATIENT ON FOLLOWING LORTAZ, DIFLUCAN EPIDURAL   Final    Gram Stain     Final    Value: ABUNDANT WBC PRESENT,BOTH PMN AND  MONONUCLEAR     NO ORGANISMS SEEN   Culture NO GROWTH 3 DAYS   Final    Report Status 01/08/2012 FINAL   Final   ANAEROBIC CULTURE     Status: Normal   Collection Time   01/04/12  9:46 PM      Component Value Range Status Comment   Specimen Description ABSCESS BACK   Final    Special Requests PATIENT ON FOLLOWING LORTAZ, DIFLUCAN EPIDURAL   Final    Gram Stain     Final    Value: ABUNDANT WBC PRESENT,BOTH PMN AND MONONUCLEAR     NO ORGANISMS SEEN     Performed at [facility]   Culture NO ANAEROBES ISOLATED   Final    Report Status 01/09/2012 FINAL   Final   GRAM STAIN     Status: Normal   Collection Time   01/04/12  9:46 PM      Component Value Range Status Comment   Specimen Description ABSCESS BACK   Final    Special Requests PATIENT ON FOLLOWING LORTAZ, DIFLUCAN EPIDURAL   Final    Gram Stain     Final    Value: ABUNDANT WBC PRESENT,BOTH PMN AND MONONUCLEAR     NO ORGANISMS SEEN   Report Status 01/04/2012 FINAL   Final   CULTURE, ROUTINE-ABSCESS     Status: Normal   Collection Time   01/07/12  4:14 PM      Component Value Range Status Comment   Specimen Description ABSCESS   Final    Special Requests PSOAS MUSCLE   Final    Gram Stain     Final    Value: MODERATE WBC PRESENT, PREDOMINANTLY PMN     NO SQUAMOUS EPITHELIAL CELLS SEEN     NO ORGANISMS SEEN   Culture NO GROWTH 3 DAYS   Final    Report Status 01/11/2012 FINAL   Final   CULTURE, ROUTINE-ABSCESS     Status: Normal   Collection Time   01/07/12  9:03 PM      Component Value Range Status Comment   Specimen Description ABSCESS EYE LEFT   Final    Special Requests INTRAOCULAR   Final    Gram Stain     Final    Value: ABUNDANT WBC PRESENT,BOTH PMN AND MONONUCLEAR     RARE GRAM POSITIVE COCCI IN PAIRS     Performed at [facility]   Culture NO GROWTH 3 DAYS   Final    Report Status 01/11/2012 FINAL   Final   ANAEROBIC CULTURE     Status: Normal   Collection Time   01/07/12  9:03 PM      Component Value  Range Status Comment   Specimen Description ABSCESS EYE LEFT   Final  Special Requests INTRAOCULAR   Final    Gram Stain     Final    Value: ABUNDANT WBC PRESENT,BOTH PMN AND MONONUCLEAR     RARE GRAM POSITIVE COCCI IN PAIRS     Performed at [facility]   Culture NO ANAEROBES ISOLATED   Final    Report Status 01/12/2012 FINAL   Final   GRAM STAIN     Status: Normal   Collection Time   01/07/12  9:03 PM      Component Value Range Status Comment   Specimen Description ABSCESS EYE LEFT   Final    Special Requests INTRAOCULAR   Final    Gram Stain     Final    Value: ABUNDANT WBC PRESENT,BOTH PMN AND MONONUCLEAR     RARE GRAM POSITIVE COCCI IN PAIRS   Report Status 01/07/2012 FINAL   Final     Anti-infectives     Start     Dose/Rate Route Frequency Ordered Stop   01/18/12 0000   dextrose 5 % SOLN 50 mL with cefTRIAXone 2 G SOLR 2 g        2 g 100 mL/hr over 30 Minutes Intravenous Every 24 hours 01/18/12 1421     01/18/12 0000   vancomycin (VANCOCIN) 1 GM/200ML SOLN        1,000 mg 200 mL/hr over 60 Minutes Intravenous Every 12 hours 01/18/12 1421     01/17/12 1800   vancomycin (VANCOCIN) IVPB 1000 mg/200 mL premix        1,000 mg 200 mL/hr over 60 Minutes Intravenous Every 12 hours 01/17/12 1141     01/17/12 1530   cefTRIAXone (ROCEPHIN) 2 g in dextrose 5 % 50 mL IVPB        2 g 100 mL/hr over 30 Minutes Intravenous Every 24 hours 01/17/12 1522     01/07/12 2137   ceFAZolin (ANCEF) injection  Status:  Discontinued          As needed 01/07/12 2138 01/07/12 2217   01/07/12 1930   ceFAZolin (ANCEF) 200 mg/mL injection 200 mg  Status:  Discontinued        1 mL Subconjunctival  Once 01/07/12 1924 01/13/12 1026   01/07/12 1930   ceFAZolin (ANCEF) 200 mg/mL injection 200 mg  Status:  Discontinued        200 mg Subconjunctival  Once 01/07/12 1926 01/13/12 1026   01/06/12 0200   vancomycin (VANCOCIN) 750 mg in sodium chloride 0.9 % 150 mL IVPB  Status:  Discontinued          750 mg 150 mL/hr over 60 Minutes Intravenous Every 8 hours 01/05/12 1952 01/17/12 1140   01/05/12 1300   cefTAZidime (FORTAZ) ophth injection 100 mg        100 mg Subconjunctival  Once 01/05/12 1247 01/05/12 1443   01/05/12 1300   vancomycin (VANCOCIN) ophth injection 25 mg        25 mg Subconjunctival  Once 01/05/12 1247 01/05/12 1446   01/05/12 1300   vancomycin (VANCOCIN) injection INJ 1 mg        1 mg Subconjunctival  Once 01/05/12 1247 01/05/12 1446   01/05/12 1200   cefTAZidime (FORTAZ) ophth injection 2.25 mg        2.25 mg Intravitreal  Once 01/05/12 1053 01/05/12 1444   01/04/12 2134   50,000 units bacitracin in 0.9% normal saline 250 mL irrigation  Status:  Discontinued  As needed 01/04/12 2145 01/04/12 2247   01/04/12 2100   bacitracin 16109 UNITS injection     Comments: AURAND, BRANDI: cabinet override         01/04/12 2100 01/05/12 0859   01/04/12 2100   ceFAZolin (ANCEF) 1-5 GM-% IVPB     Comments: AURAND, BRANDI: cabinet override         01/04/12 2100 01/05/12 0859   01/04/12 1830   fluconazole (DIFLUCAN) IVPB 400 mg  Status:  Discontinued        400 mg 200 mL/hr over 60 Minutes Intravenous Every 24 hours 01/03/12 1827 01/07/12 1616   01/03/12 1930   fluconazole (DIFLUCAN) IVPB 800 mg        800 mg 400 mL/hr over 60 Minutes Intravenous  Once 01/03/12 1826 01/03/12 2139   01/03/12 1245   cefTAZidime (FORTAZ) ophth injection 2.25 mg  Status:  Discontinued        2.25 mg Intravitreal  Once 01/03/12 1135 01/09/12 0427   01/03/12 1245   cefTAZidime (FORTAZ) ophth injection 100 mg  Status:  Discontinued        100 mg Subconjunctival  Once 01/03/12 1135 01/09/12 0427   01/03/12 1200   vancomycin (VANCOCIN) ophth injection 0.1 mg  Status:  Discontinued        0.1 mg Intravitreal  Once 01/03/12 1130 01/09/12 0427   01/03/12 1200   vancomycin (VANCOCIN) ophth injection 25 mg  Status:  Discontinued        25 mg Subconjunctival  Once 01/03/12 1131 01/09/12  0427   01/03/12 0500   vancomycin (VANCOCIN) 750 mg in sodium chloride 0.9 % 150 mL IVPB  Status:  Discontinued        750 mg 150 mL/hr over 60 Minutes Intravenous Every 12 hours 01/02/12 1716 01/05/12 1953   01/03/12 0200   ceFEPIme (MAXIPIME) 2 g in dextrose 5 % 50 mL IVPB  Status:  Discontinued        2 g 100 mL/hr over 30 Minutes Intravenous Every 8 hours 01/02/12 1716 01/02/12 2041   01/02/12 2200   cefTAZidime (FORTAZ) 1 g in dextrose 5 % 50 mL IVPB  Status:  Discontinued        1 g 100 mL/hr over 30 Minutes Intravenous 3 times per day 01/02/12 2106 01/17/12 1522   01/02/12 1400   ceFEPIme (MAXIPIME) 2 g in dextrose 5 % 50 mL IVPB  Status:  Discontinued        2 g 100 mL/hr over 30 Minutes Intravenous 3 times per day 01/02/12 1318 01/02/12 1716   01/02/12 1400   vancomycin (VANCOCIN) 750 mg in sodium chloride 0.9 % 150 mL IVPB  Status:  Discontinued        750 mg 150 mL/hr over 60 Minutes Intravenous Every 12 hours 01/02/12 1350 01/02/12 1716   12/31/11 1530   valACYclovir (VALTREX) tablet 2,000 mg        2,000 mg Oral 2 times daily 12/31/11 1358 12/31/11 2235   12/31/11 0100   moxifloxacin (AVELOX) IVPB 400 mg  Status:  Discontinued        400 mg 250 mL/hr over 60 Minutes Intravenous Every 24 hours 12/31/11 0040 01/03/12 1913          Assessment: Patient is a 61 y.o F on vancomycin and fortaz day #18 for 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  Neurosurgery recommends to treat with abx for 3 months.  Vancomycin trough level = 15.8 -  therapeutic  Goal of Therapy:  Vancomycin trough level 15-20 mcg/ml  Plan:  1) Continue vancomycin 1 Gram IV Q 12 hours 2) Continue to follow  Thank you.  Elwin Sleight 01/19/2012,7:10 PM

## 2012-01-19 NOTE — Progress Notes (Signed)
Subjective: Anxious to go home   Objective: Vital signs in last 24 hours: Filed Vitals:   01/18/12 1311 01/18/12 2222 01/19/12 0557 01/19/12 1357  BP: 125/70 155/76 151/79 131/63  Pulse: 107 90 86 94  Temp: 97.9 F (36.6 C) 98.3 F (36.8 C) 98.2 F (36.8 C) 98.7 F (37.1 C)  TempSrc:  Oral Oral Oral  Resp: 18 20 20 20   Height:      Weight:   57.1 kg (125 lb 14.1 oz)   SpO2: 97% 94% 97% 95%   Weight change: -2.9 kg (-6 lb 6.3 oz)  Intake/Output Summary (Last 24 hours) at 01/19/12 1533 Last data filed at 01/19/12 1440  Gross per 24 hour  Intake   1430 ml  Output   2750 ml  Net  -1320 ml    Physical Exam: General: Awake, Oriented, comforatbale HEENT: L eye removed , covered Neck: Supple CV: S1 and S2, rrr Lungs: clear ant Abdomen: Soft, Nontender, Nondistended, +bowel sounds. Ext: Good pulses. No edema., weakness B/L Neuro:Non Focal Musculoskeletal-3x2 small cystic area in the upper shoulder, no overlying erythema or tenderness.  Lab Results:  Basename 01/17/12 0500  NA 135  K 3.1*  CL 101  CO2 26  GLUCOSE 90  BUN 4*  CREATININE 0.50  CALCIUM 8.0*  MG --  PHOS --   No results found for this basename: AST:2,ALT:2,ALKPHOS:2,BILITOT:2,PROT:2,ALBUMIN:2 in the last 72 hours No results found for this basename: LIPASE:2,AMYLASE:2 in the last 72 hours  Basename 01/17/12 0500  WBC 6.0  NEUTROABS --  HGB 8.4*  HCT 25.7*  MCV 90.8  PLT 435*    Medications: I have reviewed the patient's current medications. Scheduled Meds:    . artificial tears   Left Eye QID  . bacitracin-polymyxin b   Left Eye QHS  . cefTRIAXone (ROCEPHIN)  IV  2 g Intravenous Q24H  . enoxaparin (LOVENOX) injection  40 mg Subcutaneous Q24H  . feeding supplement  237 mL Oral BID  . pantoprazole  40 mg Oral BID AC  . polyethylene glycol  17 g Oral Daily  . vancomycin  1,000 mg Intravenous Q12H   Continuous Infusions:    . sodium chloride 20 mL/hr at 01/18/12 0606   PRN  Meds:.acetaminophen, acetaminophen, alum & mag hydroxide-simeth, diphenhydrAMINE, HYDROcodone-acetaminophen, hydrocortisone cream, ondansetron (ZOFRAN) IV, ondansetron, sodium chloride  Assessment/Plan: Interim note  8 female with chr low back pain and hx of hep B presented with 2 days hx of painless left eye blindness with hx of nausea. Vomiting and diarrhea 1 week back with findings of left anterior uveitis and multilobar left sided pneumonia on CT scan.  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 I will attempt to get in touch with ophthalmology as well to see if they have any further recommendations prior to the patient being discharged in the next 2-3 days.  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  -Dr Randon Goldsmith performed vitrectomy on 3/4 with cx sent-negative Eye worsened and on 3/8 underwent removal and culture (pending)-all cultures negative to date. Current plans are to followup with Dr. Randon Goldsmith one week from discharge.  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  Left sided multilobar PNA  Secondary to infection- on abx  Anemia  Iron deficiency noted on iron panel  b12 and folate wnl  Stool for occult blood negative  Will monitor closely, hold off on trasnfusion For now until acute issues resolve  GI eval as out patient once acute issue resolved   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   Hypokalemia resolved  Diarrhea  Prior to admission  c diff negative  resolved   DVT prophylaxis: lovenox SCD   Full code   Disposition -likely [facility]/RN-likely will discharge today after vanco dose   LOS: 20 days  Donna Tapia, 01/19/2012, 3:33 PM

## 2012-01-21 DIAGNOSIS — Q111 Other anophthalmos: Secondary | ICD-10-CM | POA: Insufficient documentation

## 2012-01-31 ENCOUNTER — Telehealth: Payer: Self-pay | Admitting: *Deleted

## 2012-01-31 NOTE — Telephone Encounter (Signed)
Donna Tapia with [facility] called to report a rash allover the pt's body. It started Saturday. Benadryl has not helped. Not pimply, just raised red rash. Has been on IV vanco for approximately 3 weeks. States she had a slight rash in the hospital that went away with benadryl.  Also has slight non-pitting edema in feet. I will ask md about this & call her back with his response.

## 2012-01-31 NOTE — Telephone Encounter (Signed)
She should be on vancomycin and another antibiotic - ceftazidime or ceftriaxone?  It may be the cephalosporin.  Is it raised red like hives?

## 2012-02-01 NOTE — Telephone Encounter (Signed)
Maybe it is the vancomycin and she needs to slow down the infusion more?  It is not consistent with a drug rash since it comes and goes.

## 2012-02-01 NOTE — Telephone Encounter (Signed)
I told her what md wrote. Reminded her to take the benadryl 1 hour before she starts the infusion & to run it slower than usual. Gave her the example that if she usually takes `1 hr to administer, slow it to take 1.5 hours. She agreed  Wanted to know if there was a stronger med for the itching. I told her I will check with the md. She does not have insurance.  Needs to be on the $4 list  If possible.

## 2012-02-01 NOTE — Telephone Encounter (Signed)
I spoke with pt. States this rash has been coming & going since thee med started. Worse over past 3-4 days. Describes it as a flat red rash that sometimes looks like hives. Other times it looks like small red spots. The intensity of itching varies. Has been taking benadryl Q4-6 hours. It helps somewhat.  Feels worse when she gives herself the med. States she gets chills & aches & itches more. II advised taking the benadryl 1 hour before giving herself the med.   Also concerned about swelling of feet & legs. She does not have a pcp. No insurance. appt here is in 1 week. Told her I will send this to md & let her know what he says

## 2012-02-15 ENCOUNTER — Ambulatory Visit (INDEPENDENT_AMBULATORY_CARE_PROVIDER_SITE_OTHER): Payer: 59 | Admitting: Internal Medicine

## 2012-02-15 ENCOUNTER — Telehealth: Payer: Self-pay | Admitting: *Deleted

## 2012-02-15 ENCOUNTER — Encounter: Payer: Self-pay | Admitting: Internal Medicine

## 2012-02-15 VITALS — BP 111/63 | HR 90 | Temp 97.9°F | Ht 69.0 in | Wt 125.8 lb

## 2012-02-15 DIAGNOSIS — H44009 Unspecified purulent endophthalmitis, unspecified eye: Secondary | ICD-10-CM | POA: Insufficient documentation

## 2012-02-15 DIAGNOSIS — M4646 Discitis, unspecified, lumbar region: Secondary | ICD-10-CM | POA: Insufficient documentation

## 2012-02-15 DIAGNOSIS — M519 Unspecified thoracic, thoracolumbar and lumbosacral intervertebral disc disorder: Secondary | ICD-10-CM

## 2012-02-15 DIAGNOSIS — D649 Anemia, unspecified: Secondary | ICD-10-CM | POA: Insufficient documentation

## 2012-02-15 NOTE — Assessment & Plan Note (Signed)
Her hemoglobin did decrease to 7 though most recently is 8.0. I have asked her to increase her iron from once a day to twice a day as tolerated. I also will check an anemia panel again with her next labs.

## 2012-02-15 NOTE — Telephone Encounter (Signed)
Called [facility], with verbal order from Dr. Luciana Axe to extend IV antibiotics x 1 month and also to add an anemia panel to next scheduled lab draw. Wendall Mola CMA

## 2012-02-15 NOTE — Patient Instructions (Signed)
Increase iron to b.i.d.

## 2012-02-15 NOTE — Assessment & Plan Note (Signed)
She continues to do well on IV antibiotics. She has seen Dr. Deatra James in followup and will continue with antibiotics with coarse dependent on the discitis.

## 2012-02-15 NOTE — Telephone Encounter (Signed)
Called [facility] to request MRI results on patient.  This was ordered by Dr. Gerlene Fee per patient and it was not done at [facility] as no imaging is showing in Epic.  Had to leave voice mail with my extension and requested that the results be faxed to Dr. Luciana Axe at [phone]. Wendall Mola CMA

## 2012-02-15 NOTE — Assessment & Plan Note (Signed)
She continues to do well with antibiotics and by her report from neurosurgery, on MRI does show improvement though persistent infection. I will get a copy of the report and likely will need to continue with antibiotics beyond the 8 week course. I have called home health agency to extend it to 12 weeks at this time. She likely will need an MRI followup again around then to assure clearance or determine if we need to again extend it more. Her inflammatory markers still have improved and we'll continue to follow those.

## 2012-02-15 NOTE — Progress Notes (Signed)
  Subjective:    Patient ID: Donna Tapia, female    DOB: Sep 05, 1951, 61 y.o.   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 She was hospitalized in early March with a complaint of sudden left eye blindness and further evaluation noted endophthalmitis, psoas abscess with discitis and pneumonia. Despite the extensive infection, all cultures remained negative. The patient was initially started on vancomycin with ceftazidime which was changed to vancomycin and ceftriaxone at discharge. She required significant debridement of her psoas abscess and drainage, laminectomy and removal of her left eye. She comes in for followup today, one month into her extended course of IV antibiotics. She continues to tolerate the medicine well, though has had some issues with intermittent rash. She does get a maculopapular rash but it does go away with antihistamine and since using Benadryl prior to therapy she has had minimal problems with a rash. She denies any fever or chills. She feels well. She did see Dr. Gerlene Fee yesterday who told her that a recent MRI that he did continue to show persistent, though improving, infection and by his estimation, she likely would need an extended course of IV antibiotics, beyond the 8 weeks. She also has had a low hemoglobin which has been persistent in fact was present on admission to the hospital but most recently was 8.0 which was improved from 7.0 the week before. Her inflammatory markers also have been improved with a near normal ESR and a CRP with a mild elevation. She also has seen Dr. Deatra James of ophthalmologic who did refer her for an implant but will need to wait for the infection to clear.   Review of Systems  Constitutional: Negative for fever, chills and unexpected weight change.  Eyes: Negative for photophobia, pain, discharge, redness and visual disturbance.  Respiratory: Negative for chest tightness  and shortness of breath.   Cardiovascular: Positive for leg swelling. Negative for chest pain and palpitations.       No current swelling  Gastrointestinal: Negative for nausea, vomiting, abdominal pain, diarrhea, constipation and abdominal distention.  Skin: Positive for rash. Negative for pallor.       Rash but not presently  Neurological: Negative for dizziness, weakness and headaches.  Hematological: Negative for adenopathy.  Psychiatric/Behavioral: Negative for dysphoric mood. The patient is not nervous/anxious.        Objective:   Physical Exam  Constitutional: She appears well-developed and well-nourished.  Eyes: Right eye exhibits no discharge.       Left with patch, removed  Cardiovascular: Normal rate, regular rhythm and normal heart sounds.  Exam reveals no gallop and no friction rub.   No murmur heard. Pulmonary/Chest: Effort normal and breath sounds normal. No respiratory distress. She has no wheezes. She has no rales.  Skin: Skin is warm and dry. No rash noted. No erythema.  Psychiatric: She has a normal mood and affect. Her behavior is normal.          Assessment & Plan:

## 2012-02-23 ENCOUNTER — Encounter: Payer: Self-pay | Admitting: Internal Medicine

## 2012-03-13 ENCOUNTER — Encounter: Payer: Self-pay | Admitting: Internal Medicine

## 2012-03-16 ENCOUNTER — Encounter: Payer: Self-pay | Admitting: Internal Medicine

## 2012-03-16 ENCOUNTER — Ambulatory Visit (INDEPENDENT_AMBULATORY_CARE_PROVIDER_SITE_OTHER): Payer: 59 | Admitting: Internal Medicine

## 2012-03-16 VITALS — BP 119/72 | HR 91 | Temp 98.1°F | Ht 69.0 in | Wt 127.0 lb

## 2012-03-16 DIAGNOSIS — H44009 Unspecified purulent endophthalmitis, unspecified eye: Secondary | ICD-10-CM

## 2012-03-16 NOTE — Patient Instructions (Signed)
Have Dr. Gerlene Fee let me know what the MRI results show.

## 2012-03-16 NOTE — Progress Notes (Signed)
  Subjective:    Patient ID: Donna Tapia, female    DOB: 1951/10/14, 61 y.o.   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 She was hospitalized in early March with a complaint of sudden left eye blindness and further evaluation noted endophthalmitis, psoas abscess with discitis and pneumonia. Despite the extensive infection, all cultures remained negative. The patient was initially started on vancomycin with ceftazidime which was changed to vancomycin and ceftriaxone at discharge. She required significant debridement of her psoas abscess and drainage, laminectomy and removal of her left eye.   She comes in for followup again today. She has continued on IV vancomycin and ceftriaxone and is tolerating the medicine well. She has had no more rash or itching. She has now completed about 2 months for possible 3 months or more course. Has seen Dr. Gerlene Fee who also is pleased with her response. She is scheduled to get a repeat MRI ordered by him in about 3 weeks. Her inflammatory markers and labs are good with a normal ESR and normal CRP. Her hemoglobin has improved considerably and is now 10. She has had no fever or chills.    Review of Systems  Constitutional: Negative.   HENT: Negative.   Respiratory: Negative for cough, shortness of breath and wheezing.   Cardiovascular: Negative for chest pain, palpitations and leg swelling.  Gastrointestinal: Negative for nausea, vomiting, abdominal pain and diarrhea.  Musculoskeletal: Negative for myalgias, joint swelling and arthralgias.  Skin: Negative for pallor and rash.  Neurological: Negative.   Psychiatric/Behavioral: Negative.        Objective:   Physical Exam  Constitutional: She appears well-developed and well-nourished. No distress.  Cardiovascular: Normal rate, regular rhythm and normal heart sounds.  Exam reveals no gallop and no friction rub.   No murmur  heard. Pulmonary/Chest: Effort normal and breath sounds normal. No respiratory distress. She has no wheezes. She has no rales.  Skin: Skin is warm and dry. No rash noted. No erythema.          Assessment & Plan:

## 2012-03-16 NOTE — Assessment & Plan Note (Signed)
She is continuing on her therapy for the disseminated infection that was culture negative. She is doing well. She will get the MRI and followup with neurosurgery and I will see her after that. Duration of antibiotics on the repeat MRI and on the opinion of neurosurgery. I will see her after the appointment and decide on whether or not to extend her course of antibiotics.

## 2012-04-04 ENCOUNTER — Telehealth: Payer: Self-pay | Admitting: *Deleted

## 2012-04-04 NOTE — Telephone Encounter (Signed)
She left a message on the voice mail telling us she will not be able to make her appt 2023-04-08. Her mom just died. She wanted to discuss extending her IV antibiotics. They are currently due to end on the 10th.  I told her she needed to make another appt but she wants md to continue the med until she is seen here again. I told her I will send this to her md & transferred her to [facility] at the front desk

## 2012-04-04 NOTE — Telephone Encounter (Signed)
Dr. Luciana Axe wrote the order "extend IV antibiotics for 2-3 weeks then reschedule appt" I called Larita Fife , a pharmacist at [facility] & gave him the verbal order. He will take care of this & notify the RN who goes to his house. The f/u appt is 04/27/12 at 9am   She does not want to return if she can avoid it. Wants Dr. Luciana Axe to look at imaging after it is done & let her know if she can get the picc pulled

## 2012-04-04 NOTE — Telephone Encounter (Signed)
I told her the meds will be extended 2-3 more weeks & asked her to make na ppt as md schedule fills up fast. Soonest available is 04/27/12 at 9am. States she has an appt hen in [place] but will see if she can change it. Was to have an MRI this week but has cancelled that as well. She is going to reschedule it & wants the md to look at the results & tell her if she can stop the meds. She wants to see if an appt is necessary. I told her to call me when it is rescheduled.  Vikki Ports is going to ask Asher Muir if she can place this pt in that open slot  I will call [facility] & tell her nurse Victorino Dike about the med extension

## 2012-04-04 NOTE — Telephone Encounter (Signed)
That will be ok not to return, just have her let us know when she gets the scan done so I can review it as well.  I think it will be ordered by the neurosurgeon.

## 2012-04-05 NOTE — Telephone Encounter (Signed)
  Left her a message to call us after she has the imaging done. Dr. Luciana Axe will want to look at it. If ok, she will not need to come back in

## 2012-04-06 ENCOUNTER — Ambulatory Visit: Payer: 59 | Admitting: Internal Medicine

## 2012-04-10 ENCOUNTER — Telehealth: Payer: Self-pay | Admitting: *Deleted

## 2012-04-10 NOTE — Telephone Encounter (Signed)
Donna Tapia with [facility] called and advised that this patient is having labs drawn on Wednedsays. And asked if that due to this they could do her dressing changes on the same day. Advised her yes and advised I will put the information in the chart.

## 2012-04-11 ENCOUNTER — Encounter: Payer: Self-pay | Admitting: Internal Medicine

## 2012-04-14 ENCOUNTER — Telehealth: Payer: Self-pay | Admitting: *Deleted

## 2012-04-14 NOTE — Telephone Encounter (Signed)
She wanted to let md know she had the mri & that she was seeing the surgeon this next Wednesday. I asked her to make sure the surgeon sends his report here for Dr. Luciana Axe. I told her md will review & let her/us know if she needs to come back in or if ok to pull PICC on 24th

## 2012-04-20 ENCOUNTER — Telehealth: Payer: Self-pay | Admitting: *Deleted

## 2012-04-20 NOTE — Telephone Encounter (Signed)
Patient called regarding her recent MRI which was ordered by Dr. Coralee Rud, he said it looked good but that Dr. Luciana Axe may want to leave her on antibiotic for 2 more weeks due to recent stress she has been under, her mother recently passed away.  Called [facility] for the report to be faxed, gave to Dr. Luciana Axe for review and will call patient back with his decision.

## 2012-04-20 NOTE — Telephone Encounter (Signed)
Message copied by Macy Mis on Thu Apr 20, 2012 12:20 PM ------      Message from: Gardiner Barefoot      Created: Thu Apr 20, 2012 12:16 PM       Two more weeks is ok with me.  Then have the pICC pulled and patient can follow up PRN.  Should call us if she is having new and worsening back pain or other concerns. Thanks

## 2012-04-20 NOTE — Telephone Encounter (Signed)
Patient notified and [facility] called and given verbal orders to continue IV antibiotics for 2 more weeks at which time PICC can be pulled. Wendall Mola CMA

## 2012-04-21 ENCOUNTER — Encounter: Payer: Self-pay | Admitting: Internal Medicine

## 2012-04-27 ENCOUNTER — Ambulatory Visit: Payer: Self-pay | Admitting: Internal Medicine

## 2012-05-05 ENCOUNTER — Encounter: Payer: Self-pay | Admitting: Internal Medicine

## 2012-05-09 ENCOUNTER — Encounter: Payer: Self-pay | Admitting: Internal Medicine

## 2013-01-27 IMAGING — CT CT MAXILLOFACIAL W/ CM
3 of 4 series · 13 of 38 positions shown, 15 images · IV contrast (omnipaque)
Comparison: 01/07/2012 and multiple previous

CLINICAL DATA: Left periorbital cellulitis and swelling.

CT MAXILLOFACIAL WITH CONTRAST
TECHNIQUE: Multidetector CT imaging of the maxillofacial
structures was performed with intravenous contrast. Multiplanar CT
image reconstructions were also generated.
Contrast: 75mL OMNIPAQUE IOHEXOL 300 MG/ML IJ SOLN

[Series 103: coronals · coronal · 0.49mm/px · 7 of 86 slices shown, 9 images (1 of 2)]
[im 11/86  brain]
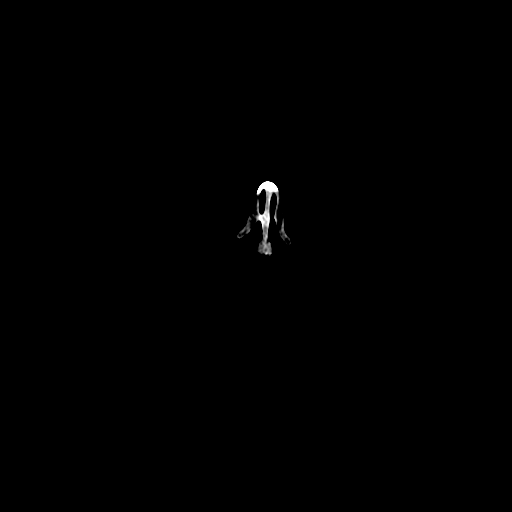
[im 11/86  bone]
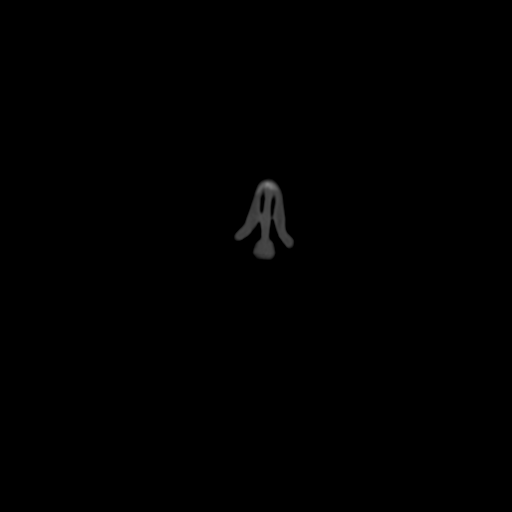
[im 22/86  bone]
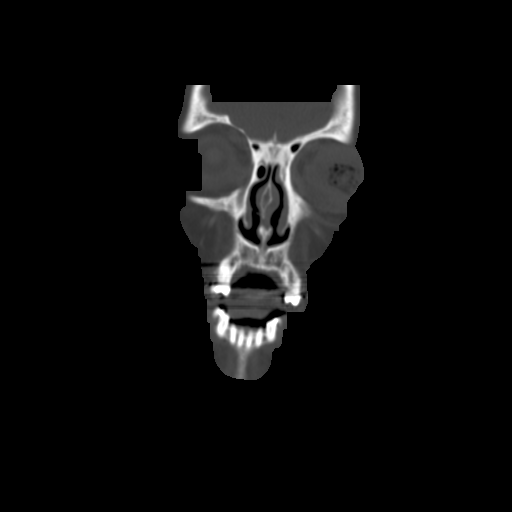
[im 32/86  bone]
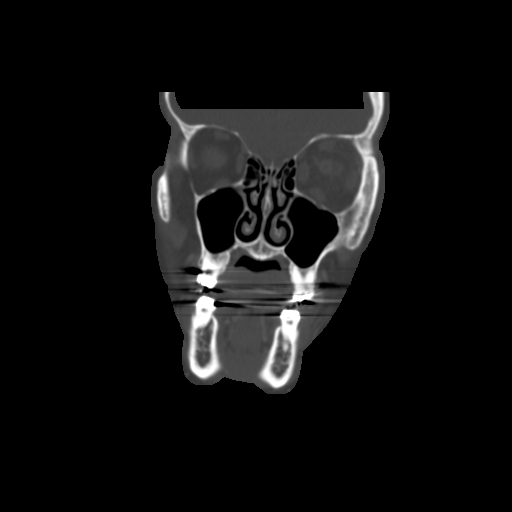
[im 43/86  bone]
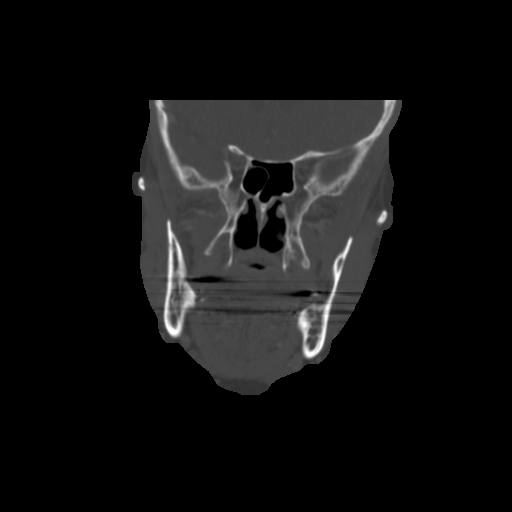
[im 54/86  brain]
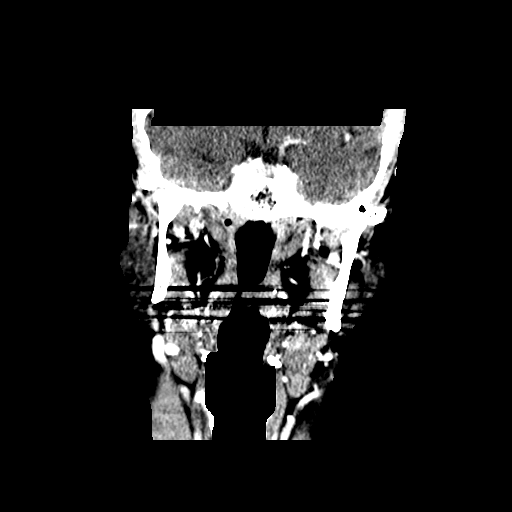
[im 54/86  bone]
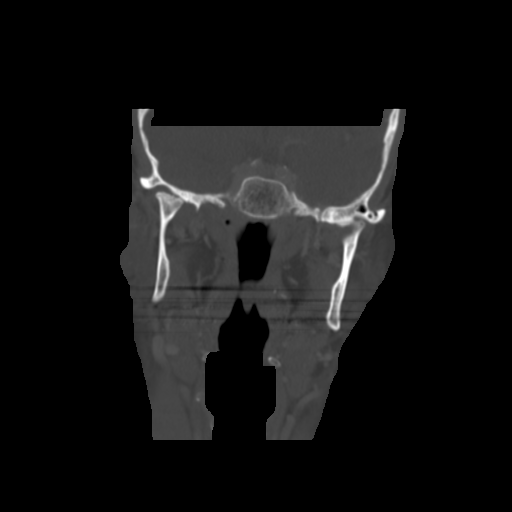
[im 64/86  bone]
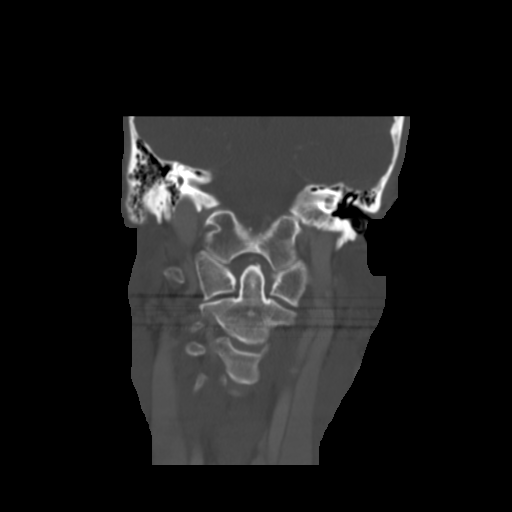
[im 75/86  bone]
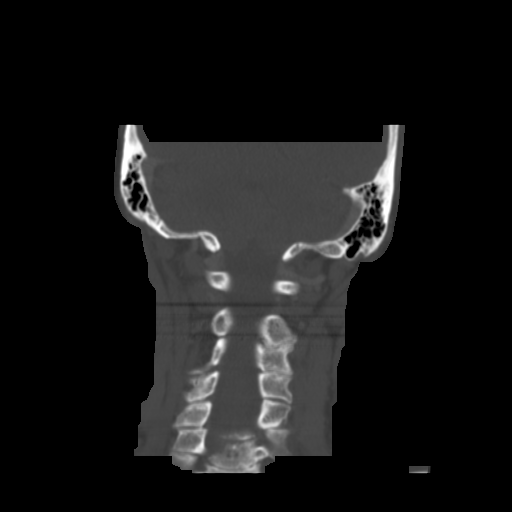

[Series 105: sagittals 2 · sagittal · 0.35mm/px · 3 of 76 slices shown]
[im 38/76  bone]
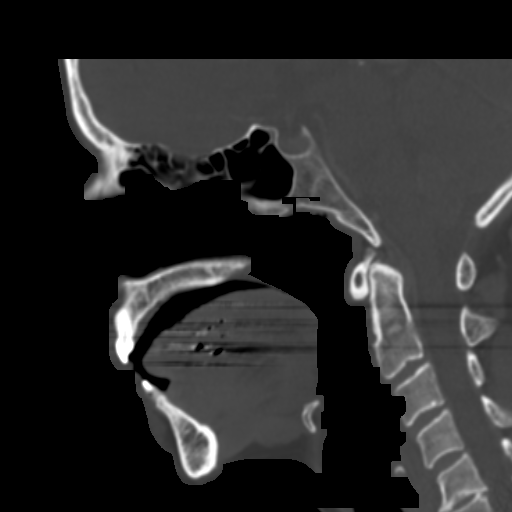
[im 51/76  bone]
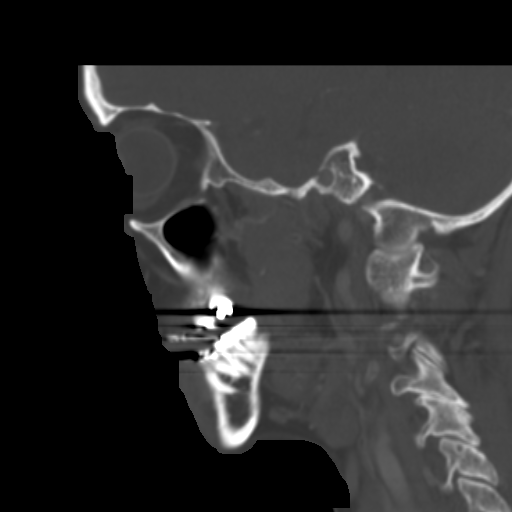
[im 57/76  bone]
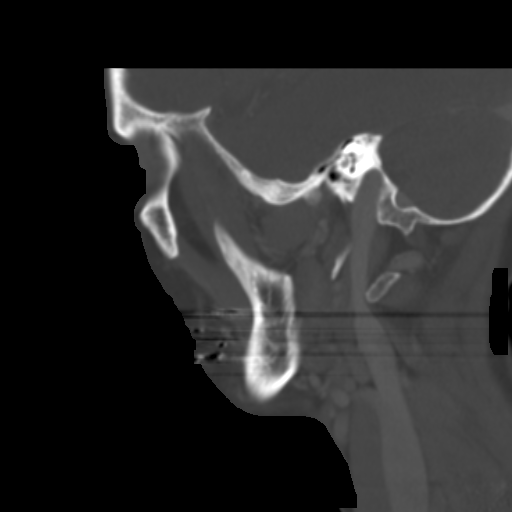

[Series 400: coronals · coronal · 0.35mm/px · 3 of 72 slices shown (2 of 2)]
[im 24/72  bone]
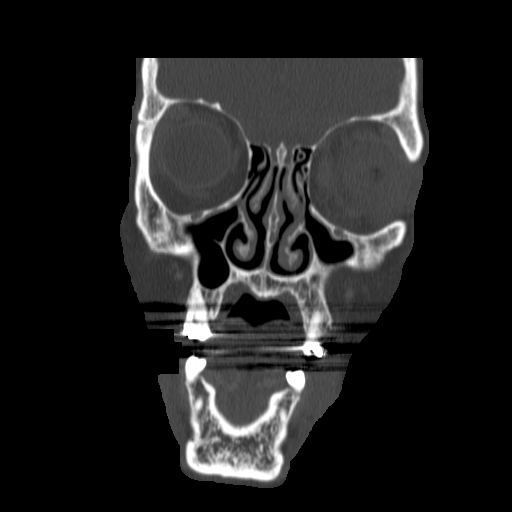
[im 36/72  bone]
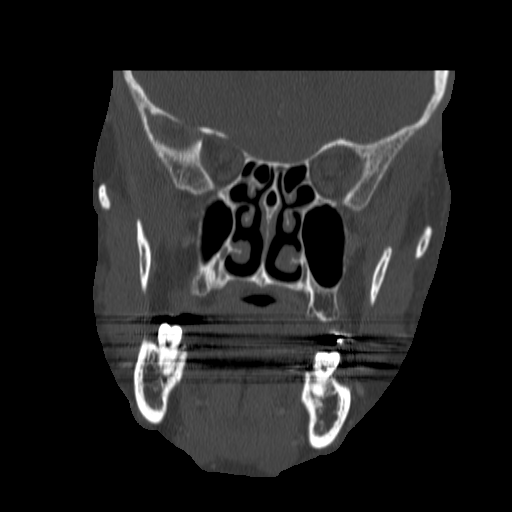
[im 48/72  bone]
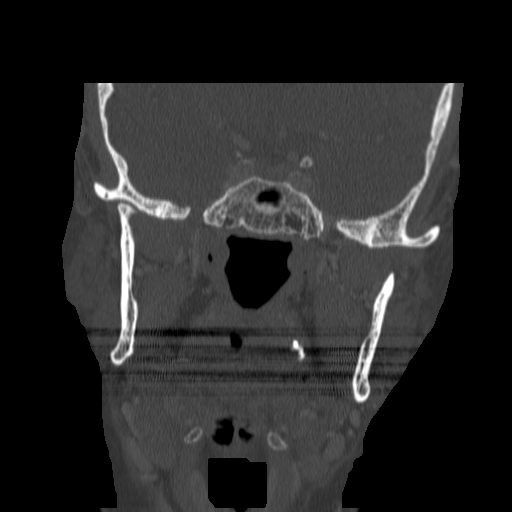

[13 of 38 positions shown; findings below may reference images not displayed]

FINDINGS: There has been interval surgery.  Hyperdense structure
covers the outer surface of the globe.  The globe appears to be
filled with heterogeneous low density material probably
representing  packing.  Outer structures of the globe appear
swollen and inflammed.  Post septal orbital fat inflammation
remains evident.  Some inflammation is seen along the left optic
nerve as well.  I cannot identify  any intra orbital undrained
fluid collection. No evidence of intracranial extension of
inflammatory disease.  Cavernous sinus regions appear symmetric.
IMPRESSION: Apparent surgical debridement of the left globe.  The globe appears
to be filled with packing material.  Persistent inflammation of the
outer layers of the globe.  Inflammatory type edema within the post-
septal orbital fat but no evidence of undrained orbital fluid
collection.

## 2014-04-15 ENCOUNTER — Encounter: Payer: Self-pay | Admitting: Family Medicine

## 2014-04-15 ENCOUNTER — Ambulatory Visit (INDEPENDENT_AMBULATORY_CARE_PROVIDER_SITE_OTHER): Payer: Commercial Managed Care - HMO | Admitting: Family Medicine

## 2014-04-15 VITALS — BP 118/80 | HR 79 | Temp 98.5°F | Ht 68.0 in | Wt 143.0 lb

## 2014-04-15 DIAGNOSIS — H269 Unspecified cataract: Secondary | ICD-10-CM

## 2014-04-15 DIAGNOSIS — Z23 Encounter for immunization: Secondary | ICD-10-CM

## 2014-04-15 DIAGNOSIS — Z1231 Encounter for screening mammogram for malignant neoplasm of breast: Secondary | ICD-10-CM

## 2014-04-15 DIAGNOSIS — Z136 Encounter for screening for cardiovascular disorders: Secondary | ICD-10-CM

## 2014-04-15 DIAGNOSIS — F418 Other specified anxiety disorders: Secondary | ICD-10-CM

## 2014-04-15 DIAGNOSIS — F341 Dysthymic disorder: Secondary | ICD-10-CM

## 2014-04-15 DIAGNOSIS — Z0000 Encounter for general adult medical examination without abnormal findings: Secondary | ICD-10-CM

## 2014-04-15 DIAGNOSIS — R809 Proteinuria, unspecified: Secondary | ICD-10-CM

## 2014-04-15 DIAGNOSIS — E2839 Other primary ovarian failure: Secondary | ICD-10-CM

## 2014-04-15 MED ORDER — ZOSTER VACCINE LIVE 19400 UNT/0.65ML ~~LOC~~ SOLR: 0.6500 mL | Freq: Once | SUBCUTANEOUS | Status: DC

## 2014-04-15 MED ORDER — SERTRALINE HCL 50 MG PO TABS: 50.0000 mg | ORAL_TABLET | Freq: Every day | ORAL | Status: DC

## 2014-04-15 NOTE — Progress Notes (Signed)
Pre visit review using our clinic review tool, if applicable. No additional management support is needed unless otherwise documented below in the visit note. 

## 2014-04-15 NOTE — Patient Instructions (Signed)

## 2014-04-15 NOTE — Progress Notes (Signed)
Subjective:    Donna Tapia is a 63 y.o. female who presents for a welcome to Medicare exam.   Cardiac risk factors: advanced age (older than 6555 for men, 4065 for women).  Activities of Daily Living  In your present state of health, do you have any difficulty performing the following activities?:  Preparing food and eating?: No Bathing yourself: No Getting dressed: No Using the toilet:No Moving around from place to place: No In the past year have you fallen or had a near fall?:No  Current exercise habits: walks dog   Dietary issues discussed: na   Depression Screen (Note: if answer to either of the following is "Yes", then a more complete depression screening is indicated)  Q1: Over the past two weeks, have you felt down, depressed or hopeless?no Q2: Over the past two weeks, have you felt little interest or pleasure in doing things? no   The following portions of the patient's history were reviewed and updated as appropriate:  She  has a past medical history of Hepatitis B (1978); Anemia; Chicken pox; Environmental allergies; and Hyperlipidemia. She  does not have any pertinent problems on file. She  has past surgical history that includes Abdominal hysterectomy; Evisceration (01/07/2012); TEE without cardioversion (01/10/2012); Lumbar laminectomy/decompression microdiscectomy (01/04/2012); and Appendectomy. Her family history includes Colon cancer in her father and mother; Coronary artery disease in her mother; Heart disease in an other family member; Hyperlipidemia in her paternal aunt and paternal uncle; Hypertension in an other family member; Parkinsonism in her mother. She was adopted. She  reports that she has never smoked. She has never used smokeless tobacco. She reports that she does not drink alcohol or use illicit drugs. She has a current medication list which includes the following prescription(s): acetaminophen, artificial tears, coenzyme q10, folic acid, and multivitamin with  minerals. Current Outpatient Prescriptions on File Prior to Visit  Medication Sig Dispense Refill  . acetaminophen (TYLENOL) 325 MG tablet Take 650 mg by mouth every 6 (six) hours as needed. For pain      . artificial tears (LACRILUBE) OINT ophthalmic ointment Apply to eye 4 (four) times daily.  1 Tube  0  . CO ENZYME Q-10 PO Take 1 capsule by mouth daily.      . folic acid (FOLVITE) 800 MCG tablet Take 400 mcg by mouth daily.      . Multiple Vitamin (MULITIVITAMIN WITH MINERALS) TABS Take 1 tablet by mouth daily.       No current facility-administered medications on file prior to visit.   She is allergic to sulfur.. Review of Systems  Review of Systems  Constitutional: Negative for activity change, appetite change and fatigue.  HENT: Negative for hearing loss, congestion, tinnitus and ear discharge.   Eyes: (see optho -- prosthetic L eye)  Respiratory: Negative for cough, chest tightness and shortness of breath.   Cardiovascular: Negative for chest pain, palpitations and leg swelling.  Gastrointestinal: Negative for abdominal pain, diarrhea, constipation and abdominal distention.  Genitourinary: Negative for urgency, frequency, decreased urine volume and difficulty urinating.  Musculoskeletal: Negative for back pain, arthralgias and gait problem.  Skin: Negative for color change, pallor and rash.  Neurological: Negative for dizziness, light-headedness, numbness and headaches.  Hematological: Negative for adenopathy. Does not bruise/bleed easily.  Psychiatric/Behavioral: Negative for suicidal ideas, confusion, sleep disturbance, self-injury, dysphoric mood, decreased concentration and agitation.  Pt is able to read and write and can do all ADLs No risk for falling No abuse/ violence in home  Objective:     Vision by Snellen chart: opth Blood pressure 118/80, pulse 79, temperature 98.5 F (36.9 C), temperature source Oral, height 5\' 8"  (1.727 m), weight 143 lb (64.864 kg),  SpO2 98.00%. Body mass index is 21.75 kg/(m^2). BP 118/80  Pulse 79  Temp(Src) 98.5 F (36.9 C) (Oral)  Ht 5\' 8"  (1.727 m)  Wt 143 lb (64.864 kg)  BMI 21.75 kg/m2  SpO2 98% General appearance: alert, cooperative, appears stated age and no distress Head: Normocephalic, without obvious abnormality, atraumatic Eyes: prosthetic L eye Ears: normal TM's and external ear canals both ears Nose: Nares normal. Septum midline. Mucosa normal. No drainage or sinus tenderness. Throat: lips, mucosa, and tongue normal; teeth and gums normal Neck: no adenopathy, no carotid bruit, no JVD, supple, symmetrical, trachea midline and thyroid not enlarged, symmetric, no tenderness/mass/nodules Back: symmetric, no curvature. ROM normal. No CVA tenderness. Lungs: clear to auscultation bilaterally Breasts: normal appearance, no masses or tenderness Heart: regular rate and rhythm, S1, S2 normal, no murmur, click, rub or gallop Abdomen: soft, non-tender; bowel sounds normal; no masses,  no organomegaly Pelvic: not indicated; post-menopausal, no abnormal Pap smears in past Extremities: extremities normal, atraumatic, no cyanosis or edema Pulses: 2+ and symmetric Skin: Skin color, texture, turgor normal. No rashes or lesions Lymph nodes: Cervical, supraclavicular, and axillary nodes normal. Neurologic: Alert and oriented X 3, normal strength and tone. Normal symmetric reflexes. Normal coordination and gait   psych--no depression, no a nxiety Assessment:     cpe     Plan:     During the course of the visit the patient was educated and counseled about appropriate screening and preventive services including:   Pneumococcal vaccine   Influenza vaccine  Td vaccine  Screening electrocardiogram  Screening mammography  Bone densitometry screening  Colorectal cancer screening  Diabetes screening  Glaucoma screening  Advanced directives: has an advanced directive - a copy HAS NOT been  provided.  Patient Instructions (the written plan) was given to the patient.  1. Welcome to Medicare preventive visit  - EKG 12-Lead - zoster vaccine live, PF, (ZOSTAVAX) [id number] UNT/0.65ML injection; Inject 19,400 Units into the skin once.  Dispense: 1 vial; Refill: 0  2. Cataract  - Ambulatory referral to Ophthalmology  3. Other screening mammogram  - MM DIGITAL SCREENING BILATERAL; Future  4. Estrogen deficiency  - DG Bone Density; Future  5. Ischemic heart disease screen  - Basic metabolic panel; Future - CBC with Differential; Future - Hepatic function panel; Future - Lipid panel; Future - POCT urinalysis dipstick; Future  6. Depression with anxiety  - sertraline (ZOLOFT) 50 MG tablet; Take 1 tablet (50 mg total) by mouth daily.  Dispense: 90 tablet; Refill: 3  7. Need for pneumococcal vaccination  - Pneumococcal conjugate vaccine 13-valent

## 2014-04-17 ENCOUNTER — Other Ambulatory Visit: Payer: Commercial Managed Care - HMO

## 2014-04-18 ENCOUNTER — Other Ambulatory Visit (INDEPENDENT_AMBULATORY_CARE_PROVIDER_SITE_OTHER): Payer: Commercial Managed Care - HMO

## 2014-04-18 DIAGNOSIS — Z136 Encounter for screening for cardiovascular disorders: Secondary | ICD-10-CM

## 2014-04-23 LAB — POCT URINALYSIS DIPSTICK
BILIRUBIN UA: NEGATIVE
GLUCOSE UA: NEGATIVE
KETONES UA: NEGATIVE
NITRITE UA: NEGATIVE
Spec Grav, UA: 1.02
Urobilinogen, UA: 0.2
pH, UA: 5

## 2014-04-23 NOTE — Addendum Note (Signed)
Addended by: BAYNES, ANGELA M on: 04/23/2014 03:09 PM   Modules accepted: Orders  

## 2014-04-23 NOTE — Addendum Note (Signed)
Addended by: Verdie ShireBAYNES, ANGELA M on: 04/23/2014 03:09 PM   Modules accepted: Orders

## 2014-04-26 LAB — URINE CULTURE: Colony Count: >=100000

## 2014-04-29 ENCOUNTER — Other Ambulatory Visit: Payer: Self-pay

## 2014-04-29 MED ORDER — NITROFURANTOIN MONOHYD MACRO 100 MG PO CAPS: 100.0000 mg | ORAL_CAPSULE | Freq: Two times a day (BID) | ORAL | Status: DC

## 2014-05-02 ENCOUNTER — Encounter: Payer: Self-pay | Admitting: Family Medicine

## 2015-03-19 ENCOUNTER — Telehealth: Payer: Self-pay

## 2015-03-19 NOTE — Telephone Encounter (Signed)
Attempted to contact patient for MMG update. Per last annual exam, patient deferred. No answer on call.

## 2015-04-09 ENCOUNTER — Telehealth: Payer: Self-pay | Admitting: Family Medicine

## 2015-04-09 NOTE — Telephone Encounter (Signed)
Pre Visit letter sent  °

## 2015-04-28 ENCOUNTER — Telehealth: Payer: Self-pay | Admitting: *Deleted

## 2015-04-28 ENCOUNTER — Encounter: Payer: Self-pay | Admitting: *Deleted

## 2015-04-28 NOTE — Telephone Encounter (Signed)
Unable to reach patient at time of Pre-Visit Call.  Left message for patient to return call when available.    

## 2015-04-28 NOTE — Addendum Note (Signed)
Addended by: Noreene LarssonLARSON, Guinn Delarosa A on: 04/28/2015 03:18 PM   Modules accepted: Orders, Medications

## 2015-04-28 NOTE — Telephone Encounter (Signed)
Pre-Visit Call completed with patient and chart updated.   Pre-Visit Info documented in Specialty Comments under [employer].    

## 2015-04-29 ENCOUNTER — Ambulatory Visit (INDEPENDENT_AMBULATORY_CARE_PROVIDER_SITE_OTHER): Payer: PPO | Admitting: Family Medicine

## 2015-04-29 ENCOUNTER — Encounter: Payer: Self-pay | Admitting: Family Medicine

## 2015-04-29 VITALS — BP 100/64 | HR 61 | Temp 98.4°F | Resp 16 | Ht 68.5 in | Wt 139.3 lb

## 2015-04-29 DIAGNOSIS — F418 Other specified anxiety disorders: Secondary | ICD-10-CM

## 2015-04-29 DIAGNOSIS — Z0000 Encounter for general adult medical examination without abnormal findings: Secondary | ICD-10-CM | POA: Diagnosis not present

## 2015-04-29 DIAGNOSIS — Z136 Encounter for screening for cardiovascular disorders: Secondary | ICD-10-CM

## 2015-04-29 DIAGNOSIS — Z780 Asymptomatic menopausal state: Secondary | ICD-10-CM | POA: Diagnosis not present

## 2015-04-29 DIAGNOSIS — Z87448 Personal history of other diseases of urinary system: Secondary | ICD-10-CM | POA: Diagnosis not present

## 2015-04-29 DIAGNOSIS — F32A Depression, unspecified: Secondary | ICD-10-CM

## 2015-04-29 DIAGNOSIS — Z1239 Encounter for other screening for malignant neoplasm of breast: Secondary | ICD-10-CM

## 2015-04-29 DIAGNOSIS — Z23 Encounter for immunization: Secondary | ICD-10-CM

## 2015-04-29 DIAGNOSIS — F329 Major depressive disorder, single episode, unspecified: Secondary | ICD-10-CM

## 2015-04-29 LAB — LIPID PANEL
Cholesterol: 219 mg/dL — ABNORMAL HIGH (ref 0–200)
HDL: 45.4 mg/dL (ref >39.00)
LDL CALC: 137 mg/dL — AB (ref 0–99)
NonHDL: 173.6
TRIGLYCERIDES: 185 mg/dL — AB (ref 0.0–149.0)
Total CHOL/HDL Ratio: 5
VLDL: 37 mg/dL (ref 0.0–40.0)

## 2015-04-29 LAB — BASIC METABOLIC PANEL
BUN: 17 mg/dL (ref 6–23)
CO2: 29 meq/L (ref 19–32)
CREATININE: 0.84 mg/dL (ref 0.40–1.20)
Calcium: 9.3 mg/dL (ref 8.4–10.5)
Chloride: 105 mEq/L (ref 96–112)
GFR: 72.05 mL/min (ref >60.00)
Glucose, Bld: 90 mg/dL (ref 70–99)
Potassium: 3.9 mEq/L (ref 3.5–5.1)
SODIUM: 140 meq/L (ref 135–145)

## 2015-04-29 LAB — HEPATIC FUNCTION PANEL
ALT: 22 U/L (ref 0–35)
AST: 22 U/L (ref 0–37)
Albumin: 4.4 g/dL (ref 3.5–5.2)
Alkaline Phosphatase: 85 U/L (ref 39–117)
BILIRUBIN TOTAL: 0.5 mg/dL (ref 0.2–1.2)
Bilirubin, Direct: 0 mg/dL (ref 0.0–0.3)
Total Protein: 7.2 g/dL (ref 6.0–8.3)

## 2015-04-29 LAB — CBC WITH DIFFERENTIAL/PLATELET
Basophils Absolute: 0 10*3/uL (ref 0.0–0.1)
Basophils Relative: 0.4 % (ref 0.0–3.0)
EOS PCT: 3.1 % (ref 0.0–5.0)
Eosinophils Absolute: 0.2 10*3/uL (ref 0.0–0.7)
HEMATOCRIT: 38.6 % (ref 36.0–46.0)
Hemoglobin: 13.1 g/dL (ref 12.0–15.0)
Lymphocytes Relative: 31.8 % (ref 12.0–46.0)
Lymphs Abs: 2.4 10*3/uL (ref 0.7–4.0)
MCHC: 34 g/dL (ref 30.0–36.0)
MCV: 94.5 fl (ref 78.0–100.0)
MONO ABS: 0.5 10*3/uL (ref 0.1–1.0)
MONOS PCT: 6.3 % (ref 3.0–12.0)
NEUTROS PCT: 58.4 % (ref 43.0–77.0)
Neutro Abs: 4.4 10*3/uL (ref 1.4–7.7)
Platelets: 275 10*3/uL (ref 150.0–400.0)
RBC: 4.09 Mil/uL (ref 3.87–5.11)
RDW: 13.1 % (ref 11.5–15.5)
WBC: 7.4 10*3/uL (ref 4.0–10.5)

## 2015-04-29 LAB — TSH: TSH: 3.05 u[IU]/mL (ref 0.35–4.50)

## 2015-04-29 MED ORDER — ZOSTER VACCINE LIVE 19400 UNT/0.65ML ~~LOC~~ SOLR: 0.6500 mL | Freq: Once | SUBCUTANEOUS | Status: DC

## 2015-04-29 MED ORDER — SERTRALINE HCL 50 MG PO TABS: 50.0000 mg | ORAL_TABLET | Freq: Every day | ORAL | Status: DC

## 2015-04-29 MED ORDER — TETANUS-DIPHTHERIA TOXOIDS TD 2-2 LF/0.5ML IM SUSP: 0.5000 mL | Freq: Once | INTRAMUSCULAR | Status: DC

## 2015-04-29 NOTE — Patient Instructions (Signed)
Preventive Care for Adults A healthy lifestyle and preventive care can promote health and wellness. Preventive health guidelines for women include the following key practices.  A routine yearly physical is a good way to check with your health care provider about your health and preventive screening. It is a chance to share any concerns and updates on your health and to receive a thorough exam.  Visit your dentist for a routine exam and preventive care every 6 months. Brush your teeth twice a day and floss once a day. Good oral hygiene prevents tooth decay and gum disease.  The frequency of eye exams is based on your age, health, family medical history, use of contact lenses, and other factors. Follow your health care provider's recommendations for frequency of eye exams.  Eat a healthy diet. Foods like vegetables, fruits, whole grains, low-fat dairy products, and lean protein foods contain the nutrients you need without too many calories. Decrease your intake of foods high in solid fats, added sugars, and salt. Eat the right amount of calories for you.Get information about a proper diet from your health care provider, if necessary.  Regular physical exercise is one of the most important things you can do for your health. Most adults should get at least 150 minutes of moderate-intensity exercise (any activity that increases your heart rate and causes you to sweat) each week. In addition, most adults need muscle-strengthening exercises on 2 or more days a week.  Maintain a healthy weight. The body mass index (BMI) is a screening tool to identify possible weight problems. It provides an estimate of body fat based on height and weight. Your health care provider can find your BMI and can help you achieve or maintain a healthy weight.For adults 20 years and older:  A BMI below 18.5 is considered underweight.  A BMI of 18.5 to 24.9 is normal.  A BMI of 25 to 29.9 is considered overweight.  A BMI of  30 and above is considered obese.  Maintain normal blood lipids and cholesterol levels by exercising and minimizing your intake of saturated fat. Eat a balanced diet with plenty of fruit and vegetables. Blood tests for lipids and cholesterol should begin at age 76 and be repeated every 5 years. If your lipid or cholesterol levels are high, you are over 50, or you are at high risk for heart disease, you may need your cholesterol levels checked more frequently.Ongoing high lipid and cholesterol levels should be treated with medicines if diet and exercise are not working.  If you smoke, find out from your health care provider how to quit. If you do not use tobacco, do not start.  Lung cancer screening is recommended for adults aged 22-80 years who are at high risk for developing lung cancer because of a history of smoking. A yearly low-dose CT scan of the lungs is recommended for people who have at least a 30-pack-year history of smoking and are a current smoker or have quit within the past 15 years. A pack year of smoking is smoking an average of 1 pack of cigarettes a day for 1 year (for example: 1 pack a day for 30 years or 2 packs a day for 15 years). Yearly screening should continue until the smoker has stopped smoking for at least 15 years. Yearly screening should be stopped for people who develop a health problem that would prevent them from having lung cancer treatment.  If you are pregnant, do not drink alcohol. If you are breastfeeding,  be very cautious about drinking alcohol. If you are not pregnant and choose to drink alcohol, do not have more than 1 drink per day. One drink is considered to be 12 ounces (355 mL) of beer, 5 ounces (148 mL) of wine, or 1.5 ounces (44 mL) of liquor.  Avoid use of street drugs. Do not share needles with anyone. Ask for help if you need support or instructions about stopping the use of drugs.  High blood pressure causes heart disease and increases the risk of  stroke. Your blood pressure should be checked at least every 1 to 2 years. Ongoing high blood pressure should be treated with medicines if weight loss and exercise do not work.  If you are 75-52 years old, ask your health care provider if you should take aspirin to prevent strokes.  Diabetes screening involves taking a blood sample to check your fasting blood sugar level. This should be done once every 3 years, after age 15, if you are within normal weight and without risk factors for diabetes. Testing should be considered at a younger age or be carried out more frequently if you are overweight and have at least 1 risk factor for diabetes.  Breast cancer screening is essential preventive care for women. You should practice "breast self-awareness." This means understanding the normal appearance and feel of your breasts and may include breast self-examination. Any changes detected, no matter how small, should be reported to a health care provider. Women in their 58s and 30s should have a clinical breast exam (CBE) by a health care provider as part of a regular health exam every 1 to 3 years. After age 16, women should have a CBE every year. Starting at age 53, women should consider having a mammogram (breast X-ray test) every year. Women who have a family history of breast cancer should talk to their health care provider about genetic screening. Women at a high risk of breast cancer should talk to their health care providers about having an MRI and a mammogram every year.  Breast cancer gene (BRCA)-related cancer risk assessment is recommended for women who have family members with BRCA-related cancers. BRCA-related cancers include breast, ovarian, tubal, and peritoneal cancers. Having family members with these cancers may be associated with an increased risk for harmful changes (mutations) in the breast cancer genes BRCA1 and BRCA2. Results of the assessment will determine the need for genetic counseling and  BRCA1 and BRCA2 testing.  Routine pelvic exams to screen for cancer are no longer recommended for nonpregnant women who are considered low risk for cancer of the pelvic organs (ovaries, uterus, and vagina) and who do not have symptoms. Ask your health care provider if a screening pelvic exam is right for you.  If you have had past treatment for cervical cancer or a condition that could lead to cancer, you need Pap tests and screening for cancer for at least 20 years after your treatment. If Pap tests have been discontinued, your risk factors (such as having a new sexual partner) need to be reassessed to determine if screening should be resumed. Some women have medical problems that increase the chance of getting cervical cancer. In these cases, your health care provider may recommend more frequent screening and Pap tests.  The HPV test is an additional test that may be used for cervical cancer screening. The HPV test looks for the virus that can cause the cell changes on the cervix. The cells collected during the Pap test can be  tested for HPV. The HPV test could be used to screen women aged 30 years and older, and should be used in women of any age who have unclear Pap test results. After the age of 30, women should have HPV testing at the same frequency as a Pap test.  Colorectal cancer can be detected and often prevented. Most routine colorectal cancer screening begins at the age of 50 years and continues through age 75 years. However, your health care provider may recommend screening at an earlier age if you have risk factors for colon cancer. On a yearly basis, your health care provider may provide home test kits to check for hidden blood in the stool. Use of a small camera at the end of a tube, to directly examine the colon (sigmoidoscopy or colonoscopy), can detect the earliest forms of colorectal cancer. Talk to your health care provider about this at age 50, when routine screening begins. Direct  exam of the colon should be repeated every 5-10 years through age 75 years, unless early forms of pre-cancerous polyps or small growths are found.  People who are at an increased risk for hepatitis B should be screened for this virus. You are considered at high risk for hepatitis B if:  You were born in a country where hepatitis B occurs often. Talk with your health care provider about which countries are considered high risk.  Your parents were born in a high-risk country and you have not received a shot to protect against hepatitis B (hepatitis B vaccine).  You have HIV or AIDS.  You use needles to inject street drugs.  You live with, or have sex with, someone who has hepatitis B.  You get hemodialysis treatment.  You take certain medicines for conditions like cancer, organ transplantation, and autoimmune conditions.  Hepatitis C blood testing is recommended for all people born from 1945 through 1965 and any individual with known risks for hepatitis C.  Practice safe sex. Use condoms and avoid high-risk sexual practices to reduce the spread of sexually transmitted infections (STIs). STIs include gonorrhea, chlamydia, syphilis, trichomonas, herpes, HPV, and human immunodeficiency virus (HIV). Herpes, HIV, and HPV are viral illnesses that have no cure. They can result in disability, cancer, and death.  You should be screened for sexually transmitted illnesses (STIs) including gonorrhea and chlamydia if:  You are sexually active and are younger than 24 years.  You are older than 24 years and your health care provider tells you that you are at risk for this type of infection.  Your sexual activity has changed since you were last screened and you are at an increased risk for chlamydia or gonorrhea. Ask your health care provider if you are at risk.  If you are at risk of being infected with HIV, it is recommended that you take a prescription medicine daily to prevent HIV infection. This is  called preexposure prophylaxis (PrEP). You are considered at risk if:  You are a heterosexual woman, are sexually active, and are at increased risk for HIV infection.  You take drugs by injection.  You are sexually active with a partner who has HIV.  Talk with your health care provider about whether you are at high risk of being infected with HIV. If you choose to begin PrEP, you should first be tested for HIV. You should then be tested every 3 months for as long as you are taking PrEP.  Osteoporosis is a disease in which the bones lose minerals and strength   with aging. This can result in serious bone fractures or breaks. The risk of osteoporosis can be identified using a bone density scan. Women ages 65 years and over and women at risk for fractures or osteoporosis should discuss screening with their health care providers. Ask your health care provider whether you should take a calcium supplement or vitamin D to reduce the rate of osteoporosis.  Menopause can be associated with physical symptoms and risks. Hormone replacement therapy is available to decrease symptoms and risks. You should talk to your health care provider about whether hormone replacement therapy is right for you.  Use sunscreen. Apply sunscreen liberally and repeatedly throughout the day. You should seek shade when your shadow is shorter than you. Protect yourself by wearing long sleeves, pants, a wide-brimmed hat, and sunglasses year round, whenever you are outdoors.  Once a month, do a whole body skin exam, using a mirror to look at the skin on your back. Tell your health care provider of new moles, moles that have irregular borders, moles that are larger than a pencil eraser, or moles that have changed in shape or color.  Stay current with required vaccines (immunizations).  Influenza vaccine. All adults should be immunized every year.  Tetanus, diphtheria, and acellular pertussis (Td, Tdap) vaccine. Pregnant women should  receive 1 dose of Tdap vaccine during each pregnancy. The dose should be obtained regardless of the length of time since the last dose. Immunization is preferred during the 27th-36th week of gestation. An adult who has not previously received Tdap or who does not know her vaccine status should receive 1 dose of Tdap. This initial dose should be followed by tetanus and diphtheria toxoids (Td) booster doses every 10 years. Adults with an unknown or incomplete history of completing a 3-dose immunization series with Td-containing vaccines should begin or complete a primary immunization series including a Tdap dose. Adults should receive a Td booster every 10 years.  Varicella vaccine. An adult without evidence of immunity to varicella should receive 2 doses or a second dose if she has previously received 1 dose. Pregnant females who do not have evidence of immunity should receive the first dose after pregnancy. This first dose should be obtained before leaving the health care facility. The second dose should be obtained 4-8 weeks after the first dose.  Human papillomavirus (HPV) vaccine. Females aged 13-26 years who have not received the vaccine previously should obtain the 3-dose series. The vaccine is not recommended for use in pregnant females. However, pregnancy testing is not needed before receiving a dose. If a female is found to be pregnant after receiving a dose, no treatment is needed. In that case, the remaining doses should be delayed until after the pregnancy. Immunization is recommended for any person with an immunocompromised condition through the age of 26 years if she did not get any or all doses earlier. During the 3-dose series, the second dose should be obtained 4-8 weeks after the first dose. The third dose should be obtained 24 weeks after the first dose and 16 weeks after the second dose.  Zoster vaccine. One dose is recommended for adults aged 60 years or older unless certain conditions are  present.  Measles, mumps, and rubella (MMR) vaccine. Adults born before 1957 generally are considered immune to measles and mumps. Adults born in 1957 or later should have 1 or more doses of MMR vaccine unless there is a contraindication to the vaccine or there is laboratory evidence of immunity to   each of the three diseases. A routine second dose of MMR vaccine should be obtained at least 28 days after the first dose for students attending postsecondary schools, health care workers, or international travelers. People who received inactivated measles vaccine or an unknown type of measles vaccine during 1963-1967 should receive 2 doses of MMR vaccine. People who received inactivated mumps vaccine or an unknown type of mumps vaccine before 1979 and are at high risk for mumps infection should consider immunization with 2 doses of MMR vaccine. For females of childbearing age, rubella immunity should be determined. If there is no evidence of immunity, females who are not pregnant should be vaccinated. If there is no evidence of immunity, females who are pregnant should delay immunization until after pregnancy. Unvaccinated health care workers born before 1957 who lack laboratory evidence of measles, mumps, or rubella immunity or laboratory confirmation of disease should consider measles and mumps immunization with 2 doses of MMR vaccine or rubella immunization with 1 dose of MMR vaccine.  Pneumococcal 13-valent conjugate (PCV13) vaccine. When indicated, a person who is uncertain of her immunization history and has no record of immunization should receive the PCV13 vaccine. An adult aged 19 years or older who has certain medical conditions and has not been previously immunized should receive 1 dose of PCV13 vaccine. This PCV13 should be followed with a dose of pneumococcal polysaccharide (PPSV23) vaccine. The PPSV23 vaccine dose should be obtained at least 8 weeks after the dose of PCV13 vaccine. An adult aged 19  years or older who has certain medical conditions and previously received 1 or more doses of PPSV23 vaccine should receive 1 dose of PCV13. The PCV13 vaccine dose should be obtained 1 or more years after the last PPSV23 vaccine dose.  Pneumococcal polysaccharide (PPSV23) vaccine. When PCV13 is also indicated, PCV13 should be obtained first. All adults aged 65 years and older should be immunized. An adult younger than age 65 years who has certain medical conditions should be immunized. Any person who resides in a nursing home or long-term care facility should be immunized. An adult smoker should be immunized. People with an immunocompromised condition and certain other conditions should receive both PCV13 and PPSV23 vaccines. People with human immunodeficiency virus (HIV) infection should be immunized as soon as possible after diagnosis. Immunization during chemotherapy or radiation therapy should be avoided. Routine use of PPSV23 vaccine is not recommended for American Indians, [place] Natives, or people younger than 65 years unless there are medical conditions that require PPSV23 vaccine. When indicated, people who have unknown immunization and have no record of immunization should receive PPSV23 vaccine. One-time revaccination 5 years after the first dose of PPSV23 is recommended for people aged 19-64 years who have chronic kidney failure, nephrotic syndrome, asplenia, or immunocompromised conditions. People who received 1-2 doses of PPSV23 before age 65 years should receive another dose of PPSV23 vaccine at age 65 years or later if at least 5 years have passed since the previous dose. Doses of PPSV23 are not needed for people immunized with PPSV23 at or after age 65 years.  Meningococcal vaccine. Adults with asplenia or persistent complement component deficiencies should receive 2 doses of quadrivalent meningococcal conjugate (MenACWY-D) vaccine. The doses should be obtained at least 2 months apart.  Microbiologists working with certain meningococcal bacteria, military recruits, people at risk during an outbreak, and people who travel to or live in countries with a high rate of meningitis should be immunized. A first-year college student up through age   21 years who is living in a residence hall should receive a dose if she did not receive a dose on or after her 16th birthday. Adults who have certain high-risk conditions should receive one or more doses of vaccine.  Hepatitis A vaccine. Adults who wish to be protected from this disease, have certain high-risk conditions, work with hepatitis A-infected animals, work in hepatitis A research labs, or travel to or work in countries with a high rate of hepatitis A should be immunized. Adults who were previously unvaccinated and who anticipate close contact with an international adoptee during the first 60 days after arrival in the [place] from a country with a high rate of hepatitis A should be immunized.  Hepatitis B vaccine. Adults who wish to be protected from this disease, have certain high-risk conditions, may be exposed to blood or other infectious body fluids, are household contacts or sex partners of hepatitis B positive people, are clients or workers in certain care facilities, or travel to or work in countries with a high rate of hepatitis B should be immunized.  Haemophilus influenzae type b (Hib) vaccine. A previously unvaccinated person with asplenia or sickle cell disease or having a scheduled splenectomy should receive 1 dose of Hib vaccine. Regardless of previous immunization, a recipient of a hematopoietic stem cell transplant should receive a 3-dose series 6-12 months after her successful transplant. Hib vaccine is not recommended for adults with HIV infection. Preventive Services / Frequency Ages 64 to 68 years  Blood pressure check.** / Every 1 to 2 years.  Lipid and cholesterol check.** / Every 5 years beginning at age  22.  Clinical breast exam.** / Every 3 years for women in their 88s and 53s.  BRCA-related cancer risk assessment.** / For women who have family members with a BRCA-related cancer (breast, ovarian, tubal, or peritoneal cancers).  Pap test.** / Every 2 years from ages 90 through 51. Every 3 years starting at age 21 through age 56 or 3 with a history of 3 consecutive normal Pap tests.  HPV screening.** / Every 3 years from ages 24 through ages 1 to 46 with a history of 3 consecutive normal Pap tests.  Hepatitis C blood test.** / For any individual with known risks for hepatitis C.  Skin self-exam. / Monthly.  Influenza vaccine. / Every year.  Tetanus, diphtheria, and acellular pertussis (Tdap, Td) vaccine.** / Consult your health care provider. Pregnant women should receive 1 dose of Tdap vaccine during each pregnancy. 1 dose of Td every 10 years.  Varicella vaccine.** / Consult your health care provider. Pregnant females who do not have evidence of immunity should receive the first dose after pregnancy.  HPV vaccine. / 3 doses over 6 months, if 72 and younger. The vaccine is not recommended for use in pregnant females. However, pregnancy testing is not needed before receiving a dose.  Measles, mumps, rubella (MMR) vaccine.** / You need at least 1 dose of MMR if you were born in 1957 or later. You may also need a 2nd dose. For females of childbearing age, rubella immunity should be determined. If there is no evidence of immunity, females who are not pregnant should be vaccinated. If there is no evidence of immunity, females who are pregnant should delay immunization until after pregnancy.  Pneumococcal 13-valent conjugate (PCV13) vaccine.** / Consult your health care provider.  Pneumococcal polysaccharide (PPSV23) vaccine.** / 1 to 2 doses if you smoke cigarettes or if you have certain conditions.  Meningococcal vaccine.** /  1 dose if you are age 19 to 21 years and a first-year college  student living in a residence hall, or have one of several medical conditions, you need to get vaccinated against meningococcal disease. You may also need additional booster doses.  Hepatitis A vaccine.** / Consult your health care provider.  Hepatitis B vaccine.** / Consult your health care provider.  Haemophilus influenzae type b (Hib) vaccine.** / Consult your health care provider. Ages 40 to 64 years  Blood pressure check.** / Every 1 to 2 years.  Lipid and cholesterol check.** / Every 5 years beginning at age 20 years.  Lung cancer screening. / Every year if you are aged 55-80 years and have a 30-pack-year history of smoking and currently smoke or have quit within the past 15 years. Yearly screening is stopped once you have quit smoking for at least 15 years or develop a health problem that would prevent you from having lung cancer treatment.  Clinical breast exam.** / Every year after age 40 years.  BRCA-related cancer risk assessment.** / For women who have family members with a BRCA-related cancer (breast, ovarian, tubal, or peritoneal cancers).  Mammogram.** / Every year beginning at age 40 years and continuing for as long as you are in good health. Consult with your health care provider.  Pap test.** / Every 3 years starting at age 30 years through age 65 or 70 years with a history of 3 consecutive normal Pap tests.  HPV screening.** / Every 3 years from ages 30 years through ages 65 to 70 years with a history of 3 consecutive normal Pap tests.  Fecal occult blood test (FOBT) of stool. / Every year beginning at age 50 years and continuing until age 75 years. You may not need to do this test if you get a colonoscopy every 10 years.  Flexible sigmoidoscopy or colonoscopy.** / Every 5 years for a flexible sigmoidoscopy or every 10 years for a colonoscopy beginning at age 50 years and continuing until age 75 years.  Hepatitis C blood test.** / For all people born from 1945 through  1965 and any individual with known risks for hepatitis C.  Skin self-exam. / Monthly.  Influenza vaccine. / Every year.  Tetanus, diphtheria, and acellular pertussis (Tdap/Td) vaccine.** / Consult your health care provider. Pregnant women should receive 1 dose of Tdap vaccine during each pregnancy. 1 dose of Td every 10 years.  Varicella vaccine.** / Consult your health care provider. Pregnant females who do not have evidence of immunity should receive the first dose after pregnancy.  Zoster vaccine.** / 1 dose for adults aged 60 years or older.  Measles, mumps, rubella (MMR) vaccine.** / You need at least 1 dose of MMR if you were born in 1957 or later. You may also need a 2nd dose. For females of childbearing age, rubella immunity should be determined. If there is no evidence of immunity, females who are not pregnant should be vaccinated. If there is no evidence of immunity, females who are pregnant should delay immunization until after pregnancy.  Pneumococcal 13-valent conjugate (PCV13) vaccine.** / Consult your health care provider.  Pneumococcal polysaccharide (PPSV23) vaccine.** / 1 to 2 doses if you smoke cigarettes or if you have certain conditions.  Meningococcal vaccine.** / Consult your health care provider.  Hepatitis A vaccine.** / Consult your health care provider.  Hepatitis B vaccine.** / Consult your health care provider.  Haemophilus influenzae type b (Hib) vaccine.** / Consult your health care provider. Ages 65   years and over  Blood pressure check.** / Every 1 to 2 years.  Lipid and cholesterol check.** / Every 5 years beginning at age 22 years.  Lung cancer screening. / Every year if you are aged 73-80 years and have a 30-pack-year history of smoking and currently smoke or have quit within the past 15 years. Yearly screening is stopped once you have quit smoking for at least 15 years or develop a health problem that would prevent you from having lung cancer  treatment.  Clinical breast exam.** / Every year after age 4 years.  BRCA-related cancer risk assessment.** / For women who have family members with a BRCA-related cancer (breast, ovarian, tubal, or peritoneal cancers).  Mammogram.** / Every year beginning at age 40 years and continuing for as long as you are in good health. Consult with your health care provider.  Pap test.** / Every 3 years starting at age 9 years through age 34 or 91 years with 3 consecutive normal Pap tests. Testing can be stopped between 65 and 70 years with 3 consecutive normal Pap tests and no abnormal Pap or HPV tests in the past 10 years.  HPV screening.** / Every 3 years from ages 57 years through ages 64 or 45 years with a history of 3 consecutive normal Pap tests. Testing can be stopped between 65 and 70 years with 3 consecutive normal Pap tests and no abnormal Pap or HPV tests in the past 10 years.  Fecal occult blood test (FOBT) of stool. / Every year beginning at age 15 years and continuing until age 17 years. You may not need to do this test if you get a colonoscopy every 10 years.  Flexible sigmoidoscopy or colonoscopy.** / Every 5 years for a flexible sigmoidoscopy or every 10 years for a colonoscopy beginning at age 86 years and continuing until age 71 years.  Hepatitis C blood test.** / For all people born from [birth year] through 1967 and any individual with known risks for hepatitis C.  Osteoporosis screening.** / A one-time screening for women ages 83 years and over and women at risk for fractures or osteoporosis.  Skin self-exam. / Monthly.  Influenza vaccine. / Every year.  Tetanus, diphtheria, and acellular pertussis (Tdap/Td) vaccine.** / 1 dose of Td every 10 years.  Varicella vaccine.** / Consult your health care provider.  Zoster vaccine.** / 1 dose for adults aged 61 years or older.  Pneumococcal 13-valent conjugate (PCV13) vaccine.** / Consult your health care provider.  Pneumococcal  polysaccharide (PPSV23) vaccine.** / 1 dose for all adults aged 28 years and older.  Meningococcal vaccine.** / Consult your health care provider.  Hepatitis A vaccine.** / Consult your health care provider.  Hepatitis B vaccine.** / Consult your health care provider.  Haemophilus influenzae type b (Hib) vaccine.** / Consult your health care provider. ** Family history and personal history of risk and conditions may change your health care provider's recommendations. Document Released: 12/14/2001 Document Revised: 03/04/2014 Document Reviewed: 03/15/2011 [facility] Patient Information 2015 [place], [place]. This information is not intended to replace advice given to you by your health care provider. Make sure you discuss any questions you have with your health care provider.

## 2015-04-29 NOTE — Progress Notes (Signed)
Pre visit review using our clinic review tool, if applicable. No additional management support is needed unless otherwise documented below in the visit note. 

## 2015-04-29 NOTE — Progress Notes (Signed)
Subjective:   Donna Tapia is a 64 y.o. female who presents for an Initial Medicare Annual Wellness Visit.  Review of Systems     Review of Systems  Constitutional: Negative for activity change, appetite change and fatigue.  HENT: Negative for hearing loss, congestion, tinnitus and ear discharge.   Eyes: Negative for visual disturbance (see optho q1y -- vision corrected to 20/20 with glasses).  Respiratory: Negative for cough, chest tightness and shortness of breath.   Cardiovascular: Negative for chest pain, palpitations and leg swelling.  Gastrointestinal: Negative for abdominal pain, diarrhea, constipation and abdominal distention.  Genitourinary: Negative for urgency, frequency, decreased urine volume and difficulty urinating.  Musculoskeletal: Negative for back pain, arthralgias and gait problem.  Skin: Negative for color change, pallor and rash.  Neurological: Negative for dizziness, light-headedness, numbness and headaches.  Hematological: Negative for adenopathy. Does not bruise/bleed easily.  Psychiatric/Behavioral: Negative for suicidal ideas, confusion, sleep disturbance, self-injury, dysphoric mood, decreased concentration and agitation.  Pt is able to read and write and can do all ADLs No risk for falling No abuse/ violence in home           Objective:    Today's Vitals   04/29/15 1022  BP: 90/62  Pulse: 61  Temp: 98.4 F (36.9 C)  TempSrc: Oral  Resp: 16  Height: 5' 8.5" (1.74 m)  Weight: 139 lb 4.8 oz (63.186 kg)  SpO2: 98%   BP 90/62 mmHg  Pulse 61  Temp(Src) 98.4 F (36.9 C) (Oral)  Resp 16  Ht 5' 8.5" (1.74 m)  Wt 139 lb 4.8 oz (63.186 kg)  BMI 20.87 kg/m2  SpO2 98% General appearance: alert, cooperative, appears stated age and no distress Head: Normocephalic, without obvious abnormality, atraumatic Eyes: L eye-prothethsis Ears: normal TM's and external ear canals both ears Nose: Nares normal. Septum midline. Mucosa normal. No drainage or  sinus tenderness. Throat: lips, mucosa, and tongue normal; teeth and gums normal Neck: no adenopathy, no carotid bruit, no JVD, supple, symmetrical, trachea midline and thyroid not enlarged, symmetric, no tenderness/mass/nodules Back: symmetric, no curvature. ROM normal. No CVA tenderness. Lungs: clear to auscultation bilaterally Breasts: normal appearance, no masses or tenderness Heart: S1, S2 normal Abdomen: soft, non-tender; bowel sounds normal; no masses,  no organomegaly Pelvic: not indicated; status post hysterectomy, negative ROS Extremities: extremities normal, atraumatic, no cyanosis or edema Pulses: 2+ and symmetric Skin: Skin color, texture, turgor normal. No rashes or lesions Lymph nodes: Cervical, supraclavicular, and axillary nodes normal. Neurologic: Alert and oriented X 3, normal strength and tone. Normal symmetric reflexes. Normal coordination and gait Psych- no depression, no anxiety Current Medications (verified) Outpatient Encounter Prescriptions as of 04/29/2015  Medication Sig  . acetaminophen (TYLENOL) 325 MG tablet Take 650 mg by mouth every 6 (six) hours as needed. For pain  . artificial tears (LACRILUBE) OINT ophthalmic ointment Apply to eye 4 (four) times daily.  . CO ENZYME Q-10 PO Take 1 capsule by mouth daily.  Marland Kitchen diptheria-tetanus toxoids (DECAVAC) 2-2 LF/0.5ML injection Inject 0.5 mLs into the muscle once.  . folic acid (FOLVITE) 800 MCG tablet Take 400 mcg by mouth daily.  . Multiple Vitamin (MULITIVITAMIN WITH MINERALS) TABS Take 1 tablet by mouth daily.  . Omega-3 Fatty Acids (FISH OIL) 1000 MG CAPS Take 1 capsule by mouth daily.  . sertraline (ZOLOFT) 50 MG tablet Take 1 tablet (50 mg total) by mouth daily.  Marland Kitchen zoster vaccine live, PF, (ZOSTAVAX) 84132 UNT/0.65ML injection Inject 19,400 Units into the  skin once. (Patient not taking: Reported on 04/29/2015)  . zoster vaccine live, PF, (ZOSTAVAX) 16109 UNT/0.65ML injection Inject 19,400 Units into the skin  once.  . [DISCONTINUED] diptheria-tetanus toxoids (DECAVAC) 2-2 LF/0.5ML injection Inject 0.5 mLs into the muscle once.   No facility-administered encounter medications on file as of 04/29/2015.    Allergies (verified) Sulfur   History: Past Medical History  Diagnosis Date  . Hepatitis B 1978  . Anemia   . Chicken pox   . Environmental allergies   . Hyperlipidemia    Past Surgical History  Procedure Laterality Date  . Abdominal hysterectomy    . Evisceration  01/07/2012    Procedure: EVISCERATION REPAIR;  Surgeon: Antony Contras, MD;  Location: [facility] OR;  Service: Ophthalmology;  Laterality: Left;  Evisceration and removal of contents left eye  . Tee without cardioversion  01/10/2012    Procedure: TRANSESOPHAGEAL ECHOCARDIOGRAM (TEE);  Surgeon: Pricilla Riffle, MD;  Location: [facility] ENDOSCOPY;  Service: Cardiovascular;  Laterality: N/A;  . Lumbar laminectomy/decompression microdiscectomy  01/04/2012    Procedure: LUMBAR LAMINECTOMY/DECOMPRESSION MICRODISCECTOMY 1 LEVEL;  Surgeon: Reinaldo Meeker, MD;  Location: [facility] NEURO ORS;  Service: Neurosurgery;  Laterality: N/A;  Lumbar Laminectomy for Epidural Abscess  . Appendectomy     Family History  Problem Relation Age of Onset  . Adopted: Yes  . Coronary artery disease Mother     Adopted mother  . Parkinsonism Mother     Adopted Mother  . Colon cancer Mother   . Colon cancer Father   . Hyperlipidemia Paternal Aunt   . Hyperlipidemia Paternal Uncle   . Heart disease      Both families  . Hypertension     Social History   Occupational History  . Not on file.   Social History Main Topics  . Smoking status: Never Smoker   . Smokeless tobacco: Never Used  . Alcohol Use: No  . Drug Use: No  . Sexual Activity: No    Tobacco Counseling Counseling given: Not Answered   Activities of Daily Living In your present state of health, do you have any difficulty performing the following activities: 04/29/2015  Hearing? N  Vision? Y  Difficulty  concentrating or making decisions? N  Walking or climbing stairs? N  Dressing or bathing? N  Doing errands, shopping? N    Immunizations and Health Maintenance Immunization History  Administered Date(s) Administered  . PPD Test 12/30/2011  . Pneumococcal Conjugate-13 04/15/2014  . Pneumococcal Polysaccharide-23 11/02/2011   Health Maintenance Due  Topic Date Due  . TETANUS/TDAP  02/13/1968  . ZOSTAVAX  02/12/2009  . DEXA SCAN  02/12/2014    Patient Care Team: Lelon Perla, DO as PCP - General (Family Medicine) Antony Contras, MD as Consulting Physician (Ophthalmology)  Indicate any recent Medical Services you may have received from other than [facility] providers in the past year (date may be approximate).     Assessment:   This is a routine wellness examination for Donna Tapia.   Hearing/Vision screen No exam data present  Dietary issues and exercise activities discussed:  Exercise--walking     Goals    None     Depression Screen PHQ 2/9 Scores 04/29/2015 04/15/2014 03/16/2012 02/15/2012  PHQ - 2 Score 0 1 0 2    Fall Risk Fall Risk  04/29/2015 04/15/2014  Falls in the past year? No No    Cognitive Function: aaox3  MMSE 30/30  Screening Tests Health Maintenance  Topic Date Due  . TETANUS/TDAP  02/13/1968  . ZOSTAVAX  02/12/2009  . DEXA SCAN  02/12/2014  . MAMMOGRAM  04/30/2015 (Originally 02/13/1999)  . INFLUENZA VACCINE  06/02/2015  . PNA vac Low Risk Adult (2 of 2 - PPSV23) 11/01/2016  . COLONOSCOPY  11/02/2019      Plan:    see AVS  During the course of the visit, Donna Tapia was educated and counseled about the following appropriate screening and preventive services:   Vaccines to include Pneumoccal, Influenza, Hepatitis B, Td, Zostavax, HCV  Electrocardiogram  Cardiovascular disease screening  Colorectal cancer screening  Bone density screening  Diabetes screening  Glaucoma screening  Mammography/PAP  Nutrition counseling  Smoking cessation  counseling  Patient Instructions (the written plan) were given to the patient.   1. Postmenopausal estrogen deficiency - DG Bone Density; Future  2. Breast cancer screening  - MM Digital Screening; Future  3. Need for prophylactic vaccination with combined diphtheria-tetanus-pertussis (DTP) vaccine  - diptheria-tetanus toxoids (DECAVAC) 2-2 LF/0.5ML injection; Inject 0.5 mLs into the muscle once.  Dispense: 0.5 mL; Refill: 0  4. Preventative health care  - Ambulatory referral to Gastroenterology  5. Need for shingles vaccine  - zoster vaccine live, PF, (ZOSTAVAX) [id number] UNT/0.65ML injection; Inject 19,400 Units into the skin once.  Dispense: 1 vial; Refill: 0  6. Depression con't zoloft - Basic metabolic panel - CBC with Differential/Platelet - Hepatic function panel - Lipid panel - POCT urinalysis dipstick - TSH  7. History of kidney disease  - Basic metabolic panel - CBC with Differential/Platelet - Hepatic function panel - Lipid panel - POCT urinalysis dipstick - TSH  Loreen FreudYvonne Lowne, DO   04/29/2015

## 2015-04-30 LAB — POCT URINALYSIS DIPSTICK
Bilirubin, UA: NEGATIVE
Blood, UA: NEGATIVE
Glucose, UA: NEGATIVE
Ketones, UA: NEGATIVE
Leukocytes, UA: NEGATIVE
Nitrite, UA: NEGATIVE
PH UA: 6
Protein, UA: NEGATIVE
Spec Grav, UA: 1.025
Urobilinogen, UA: 4

## 2015-05-01 ENCOUNTER — Telehealth: Payer: Self-pay | Admitting: Family Medicine

## 2015-05-01 DIAGNOSIS — Z23 Encounter for immunization: Secondary | ICD-10-CM

## 2015-05-01 NOTE — Telephone Encounter (Addendum)
[facility]  Call back number: [phone]   Reason for call:  Pharmacy called regarding diptheria-tetanus toxoids [facility](DECAVAC) 2-2 LF/0.5ML injection in need of clarification should it be without pertussis just TD or TDAP

## 2015-05-02 MED ORDER — TETANUS-DIPHTH-ACELL PERTUSSIS 5-2.5-18.5 LF-MCG/0.5 IM SUSP: 0.5000 mL | Freq: Once | INTRAMUSCULAR | Status: DC

## 2015-05-02 NOTE — Telephone Encounter (Signed)
Rx for Tdap faxed. The incorrect Rx was given.    KP

## 2015-05-20 ENCOUNTER — Ambulatory Visit (HOSPITAL_BASED_OUTPATIENT_CLINIC_OR_DEPARTMENT_OTHER)
Admission: RE | Admit: 2015-05-20 | Discharge: 2015-05-20 | Disposition: A | Discharge status: Home / Self Care | Payer: PPO | Source: Ambulatory Visit | Attending: Family Medicine | Admitting: Family Medicine

## 2015-05-20 DIAGNOSIS — R928 Other abnormal and inconclusive findings on diagnostic imaging of breast: Secondary | ICD-10-CM | POA: Insufficient documentation

## 2015-05-20 DIAGNOSIS — M810 Age-related osteoporosis without current pathological fracture: Secondary | ICD-10-CM | POA: Insufficient documentation

## 2015-05-20 DIAGNOSIS — Z1231 Encounter for screening mammogram for malignant neoplasm of breast: Secondary | ICD-10-CM | POA: Diagnosis not present

## 2015-05-20 DIAGNOSIS — M8580 Other specified disorders of bone density and structure, unspecified site: Secondary | ICD-10-CM | POA: Diagnosis not present

## 2015-05-20 DIAGNOSIS — Z1239 Encounter for other screening for malignant neoplasm of breast: Secondary | ICD-10-CM

## 2015-05-20 DIAGNOSIS — Z780 Asymptomatic menopausal state: Secondary | ICD-10-CM | POA: Diagnosis present

## 2015-05-20 LAB — HM MAMMOGRAPHY

## 2015-05-22 ENCOUNTER — Other Ambulatory Visit: Payer: Self-pay

## 2015-05-22 MED ORDER — ALENDRONATE SODIUM 70 MG PO TABS
70.0000 mg | ORAL_TABLET | ORAL | Status: DC
Start: 2015-05-22 — End: 2016-05-11

## 2015-05-23 ENCOUNTER — Encounter: Payer: Self-pay | Admitting: *Deleted

## 2015-06-02 ENCOUNTER — Telehealth: Payer: Self-pay | Admitting: Family Medicine

## 2015-06-02 NOTE — Telephone Encounter (Signed)
No results in computer--- call radiology for results---thanks

## 2015-06-02 NOTE — Telephone Encounter (Signed)
Can be reached: [phone]  Reason for call: Pt calling for results of mammogram 05/20/15. No report in so she is worried.

## 2015-06-02 NOTE — Telephone Encounter (Signed)
To MD to review. I don't see anything?    KP

## 2015-06-03 NOTE — Telephone Encounter (Signed)
Additional imaging needed-- radiology said they were going to contact the patient and set it up.  Make sure she talked to them.

## 2015-06-03 NOTE — Telephone Encounter (Signed)
The radiologist read it today.     KP

## 2015-06-04 ENCOUNTER — Other Ambulatory Visit: Payer: Self-pay | Admitting: Family Medicine

## 2015-06-04 DIAGNOSIS — R928 Other abnormal and inconclusive findings on diagnostic imaging of breast: Secondary | ICD-10-CM

## 2015-06-04 NOTE — Telephone Encounter (Signed)
Patient has been made and she verbalized understanding, she said [facility].      KP

## 2015-06-10 ENCOUNTER — Ambulatory Visit
Admission: RE | Admit: 2015-06-10 | Discharge: 2015-06-10 | Disposition: A | Discharge status: Home / Self Care | Payer: PPO | Source: Ambulatory Visit | Attending: Family Medicine | Admitting: Family Medicine

## 2015-06-10 DIAGNOSIS — R928 Other abnormal and inconclusive findings on diagnostic imaging of breast: Secondary | ICD-10-CM

## 2016-05-11 ENCOUNTER — Telehealth: Payer: Self-pay | Admitting: Family Medicine

## 2016-05-11 ENCOUNTER — Encounter: Payer: Self-pay | Admitting: Family Medicine

## 2016-05-11 ENCOUNTER — Ambulatory Visit (INDEPENDENT_AMBULATORY_CARE_PROVIDER_SITE_OTHER): Payer: PPO | Admitting: Family Medicine

## 2016-05-11 VITALS — BP 118/70 | HR 67 | Temp 98.0°F | Ht 68.0 in | Wt 136.4 lb

## 2016-05-11 DIAGNOSIS — R002 Palpitations: Secondary | ICD-10-CM | POA: Diagnosis not present

## 2016-05-11 DIAGNOSIS — Z79899 Other long term (current) drug therapy: Secondary | ICD-10-CM

## 2016-05-11 DIAGNOSIS — F418 Other specified anxiety disorders: Secondary | ICD-10-CM | POA: Diagnosis not present

## 2016-05-11 DIAGNOSIS — Z0000 Encounter for general adult medical examination without abnormal findings: Secondary | ICD-10-CM

## 2016-05-11 DIAGNOSIS — Z1239 Encounter for other screening for malignant neoplasm of breast: Secondary | ICD-10-CM | POA: Diagnosis not present

## 2016-05-11 DIAGNOSIS — Z0001 Encounter for general adult medical examination with abnormal findings: Secondary | ICD-10-CM | POA: Diagnosis not present

## 2016-05-11 DIAGNOSIS — M810 Age-related osteoporosis without current pathological fracture: Secondary | ICD-10-CM

## 2016-05-11 DIAGNOSIS — E785 Hyperlipidemia, unspecified: Secondary | ICD-10-CM | POA: Diagnosis not present

## 2016-05-11 LAB — LIPID PANEL
CHOLESTEROL: 257 mg/dL — AB (ref 0–200)
HDL: 49.1 mg/dL (ref >39.00)
NonHDL: 208.26
Total CHOL/HDL Ratio: 5
Triglycerides: 233 mg/dL — ABNORMAL HIGH (ref 0.0–149.0)
VLDL: 46.6 mg/dL — ABNORMAL HIGH (ref 0.0–40.0)

## 2016-05-11 LAB — COMPREHENSIVE METABOLIC PANEL
ALT: 31 U/L (ref 0–35)
AST: 28 U/L (ref 0–37)
Albumin: 4.4 g/dL (ref 3.5–5.2)
Alkaline Phosphatase: 87 U/L (ref 39–117)
BILIRUBIN TOTAL: 0.6 mg/dL (ref 0.2–1.2)
BUN: 17 mg/dL (ref 6–23)
CALCIUM: 9.6 mg/dL (ref 8.4–10.5)
CHLORIDE: 104 meq/L (ref 96–112)
CO2: 30 mEq/L (ref 19–32)
Creatinine, Ser: 0.85 mg/dL (ref 0.40–1.20)
GFR: 70.85 mL/min (ref >60.00)
Glucose, Bld: 89 mg/dL (ref 70–99)
Potassium: 4.2 mEq/L (ref 3.5–5.1)
Sodium: 138 mEq/L (ref 135–145)
Total Protein: 7.2 g/dL (ref 6.0–8.3)

## 2016-05-11 LAB — CBC WITH DIFFERENTIAL/PLATELET
BASOS PCT: 0.3 % (ref 0.0–3.0)
Basophils Absolute: 0 10*3/uL (ref 0.0–0.1)
Eosinophils Absolute: 0.2 10*3/uL (ref 0.0–0.7)
Eosinophils Relative: 2.1 % (ref 0.0–5.0)
HEMATOCRIT: 37.7 % (ref 36.0–46.0)
Hemoglobin: 13 g/dL (ref 12.0–15.0)
LYMPHS PCT: 31.4 % (ref 12.0–46.0)
Lymphs Abs: 2.7 10*3/uL (ref 0.7–4.0)
MCHC: 34.5 g/dL (ref 30.0–36.0)
MCV: 92.9 fl (ref 78.0–100.0)
MONOS PCT: 6.6 % (ref 3.0–12.0)
Monocytes Absolute: 0.6 10*3/uL (ref 0.1–1.0)
NEUTROS ABS: 5.2 10*3/uL (ref 1.4–7.7)
Neutrophils Relative %: 59.6 % (ref 43.0–77.0)
Platelets: 275 10*3/uL (ref 150.0–400.0)
RBC: 4.06 Mil/uL (ref 3.87–5.11)
RDW: 13.6 % (ref 11.5–15.5)
WBC: 8.7 10*3/uL (ref 4.0–10.5)

## 2016-05-11 LAB — LDL CHOLESTEROL, DIRECT: Direct LDL: 177 mg/dL

## 2016-05-11 LAB — POCT URINALYSIS DIPSTICK
Bilirubin, UA: NEGATIVE
Glucose, UA: 100
Ketones, UA: NEGATIVE
Leukocytes, UA: NEGATIVE
Nitrite, UA: NEGATIVE
PROTEIN UA: NEGATIVE
RBC UA: NEGATIVE
UROBILINOGEN UA: 0.2
pH, UA: 6

## 2016-05-11 LAB — VITAMIN D 25 HYDROXY (VIT D DEFICIENCY, FRACTURES): VITD: 35.41 ng/mL (ref 30.00–100.00)

## 2016-05-11 MED ORDER — SERTRALINE HCL 50 MG PO TABS: 50.0000 mg | ORAL_TABLET | Freq: Every day | ORAL | Status: DC

## 2016-05-11 NOTE — Telephone Encounter (Signed)
Noted  KP 

## 2016-05-11 NOTE — Progress Notes (Signed)
Subjective:   Donna Tapia is a 65 y.o. female who presents for Medicare Annual (Subsequent) preventive examination.  Review of Systems:   Review of Systems  Constitutional: Negative for activity change, appetite change and fatigue.  HENT: Negative for hearing loss, congestion, tinnitus and ear discharge.   Eyes: Negative for visual disturbance (see optho q1y --+cataracts) Respiratory: Negative for cough, chest tightness and shortness of breath.   Cardiovascular: Negative for chest pain, palpitations and leg swelling.  Gastrointestinal: Negative for abdominal pain, diarrhea, constipation and abdominal distention.  Genitourinary: Negative for urgency, frequency, decreased urine volume and difficulty urinating.  Musculoskeletal: Negative for back pain, arthralgias and gait problem.  Skin: Negative for color change, pallor and rash.  Neurological: Negative for dizziness, light-headedness, numbness and headaches.  Hematological: Negative for adenopathy. Does not bruise/bleed easily.  Psychiatric/Behavioral: Negative for suicidal ideas, confusion, sleep disturbance, self-injury, dysphoric mood, decreased concentration and agitation.  Pt is able to read and write and can do all ADLs No risk for falling No abuse/ violence in home           Objective:     Vitals: BP 118/70 mmHg  Pulse 67  Temp(Src) 98 F (36.7 C) (Oral)  Ht 5\' 8"  (1.727 m)  Wt 136 lb 6.4 oz (61.871 kg)  BMI 20.74 kg/m2  SpO2 98%  Body mass index is 20.74 kg/(m^2). BP 118/70 mmHg  Pulse 67  Temp(Src) 98 F (36.7 C) (Oral)  Ht 5\' 8"  (1.727 m)  Wt 136 lb 6.4 oz (61.871 kg)  BMI 20.74 kg/m2  SpO2 98% General appearance: alert, cooperative, appears stated age and no distress Head: Normocephalic, without obvious abnormality, atraumatic Eyes: negative findings: prothetic L eye, positive findings: +cataract R eye Ears: normal TM's and external ear canals both ears Nose: Nares normal. Septum midline. Mucosa  normal. No drainage or sinus tenderness. Throat: lips, mucosa, and tongue normal; teeth and gums normal Neck: no adenopathy, no carotid bruit, no JVD, supple, symmetrical, trachea midline and thyroid not enlarged, symmetric, no tenderness/mass/nodules Back: scoliosis Lungs: clear to auscultation bilaterally Breasts: normal appearance, no masses or tenderness Heart: S1, S2 normal Abdomen: soft, non-tender; bowel sounds normal; no masses,  no organomegaly Pelvic: not indicated; status post hysterectomy, negative ROS Extremities: extremities normal, atraumatic, no cyanosis or edema Pulses: 2+ and symmetric Skin: Skin color, texture, turgor normal. No rashes or lesions Lymph nodes: Cervical, supraclavicular, and axillary nodes normal. Neurologic: Alert and oriented X 3, normal strength and tone. Normal symmetric reflexes. Normal coordination and gait   Tobacco History  Smoking status  . Never Smoker   Smokeless tobacco  . Never Used     Counseling given: Not Answered   Past Medical History  Diagnosis Date  . Hepatitis B 1978  . Anemia   . Chicken pox   . Environmental allergies   . Hyperlipidemia    Past Surgical History  Procedure Laterality Date  . Abdominal hysterectomy    . Evisceration  01/07/2012    Procedure: EVISCERATION REPAIR;  Surgeon: Antony Contras, MD;  Location: [facility] OR;  Service: Ophthalmology;  Laterality: Left;  Evisceration and removal of contents left eye  . Tee without cardioversion  01/10/2012    Procedure: TRANSESOPHAGEAL ECHOCARDIOGRAM (TEE);  Surgeon: Pricilla Riffle, MD;  Location: [facility] ENDOSCOPY;  Service: Cardiovascular;  Laterality: N/A;  . Lumbar laminectomy/decompression microdiscectomy  01/04/2012    Procedure: LUMBAR LAMINECTOMY/DECOMPRESSION MICRODISCECTOMY 1 LEVEL;  Surgeon: Reinaldo Meeker, MD;  Location: [facility] NEURO ORS;  Service:  Neurosurgery;  Laterality: N/A;  Lumbar Laminectomy for Epidural Abscess  . Appendectomy     Family History  Problem Relation  Age of Onset  . Adopted: Yes  . Coronary artery disease Mother     Adopted mother  . Parkinsonism Mother     Adopted Mother  . Colon cancer Mother   . Colon cancer Father   . Hyperlipidemia Paternal Aunt   . Hyperlipidemia Paternal Uncle   . Heart disease      Both families  . Hypertension     History  Sexual Activity  . Sexual Activity: No    Outpatient Encounter Prescriptions as of 05/11/2016  Medication Sig  . acetaminophen (TYLENOL) 325 MG tablet Take 650 mg by mouth every 6 (six) hours as needed. For pain  . artificial tears (LACRILUBE) OINT ophthalmic ointment Apply to eye 4 (four) times daily.  . calcium carbonate (OS-CAL - DOSED IN MG OF ELEMENTAL CALCIUM) 1250 (500 Ca) MG tablet Take 1 tablet by mouth daily with breakfast.  . CO ENZYME Q-10 PO Take 1 capsule by mouth daily.  . folic acid (FOLVITE) 400 MCG tablet Take 400 mcg by mouth daily.  . Multiple Vitamin (MULITIVITAMIN WITH MINERALS) TABS Take 1 tablet by mouth daily.  . Omega-3 Fatty Acids (FISH OIL) 1000 MG CAPS Take 1 capsule by mouth daily.  . sertraline (ZOLOFT) 50 MG tablet Take 1 tablet (50 mg total) by mouth daily.  . [DISCONTINUED] folic acid (FOLVITE) 800 MCG tablet Take 400 mcg by mouth daily.  . [DISCONTINUED] sertraline (ZOLOFT) 50 MG tablet Take 1 tablet (50 mg total) by mouth daily.  . [DISCONTINUED] alendronate (FOSAMAX) 70 MG tablet Take 1 tablet (70 mg total) by mouth every 7 (seven) days. Take with a full glass of water on an empty stomach.  . [DISCONTINUED] diptheria-tetanus toxoids (DECAVAC) 2-2 LF/0.5ML injection Inject 0.5 mLs into the muscle once.  . [DISCONTINUED] Tdap (BOOSTRIX) 5-2.5-18.5 LF-MCG/0.5 injection Inject 0.5 mLs into the muscle once.  . [DISCONTINUED] zoster vaccine live, PF, (ZOSTAVAX) 16109 UNT/0.65ML injection Inject 19,400 Units into the skin once. (Patient not taking: Reported on 04/29/2015)  . [DISCONTINUED] zoster vaccine live, PF, (ZOSTAVAX) 60454 UNT/0.65ML injection  Inject 19,400 Units into the skin once.   No facility-administered encounter medications on file as of 05/11/2016.    Activities of Daily Living In your present state of health, do you have any difficulty performing the following activities: 05/11/2016  Hearing? N  Vision? N  Difficulty concentrating or making decisions? N  Walking or climbing stairs? N  Dressing or bathing? N  Doing errands, shopping? N    Patient Care Team: [name], DO as PCP - General (Family Medicine) Antony Contras, MD as Consulting Physician (Ophthalmology)    Assessment:    cpe Exercise Activities and Dietary recommendations Current Exercise Habits: Home exercise routine, Type of exercise: walking, Time (Minutes): 15, Frequency (Times/Week): 3, Weekly Exercise (Minutes/Week): 45, Intensity: Mild, Exercise limited by: None identified  Goals    None     Fall Risk Fall Risk  05/11/2016 04/29/2015 04/29/2015 04/15/2014  Falls in the past year? No No No No   Depression Screen PHQ 2/9 Scores 05/11/2016 04/29/2015 04/29/2015 04/15/2014  PHQ - 2 Score 0 0 0 1     Cognitive Testing mmse 30/30  Immunization History  Administered Date(s) Administered  . PPD Test 12/30/2011  . Pneumococcal Conjugate-13 04/15/2014  . Pneumococcal Polysaccharide-23 11/02/2011   Screening Tests Health Maintenance  Topic Date Due  .  TETANUS/TDAP  02/13/1968  . ZOSTAVAX  02/12/2009  . INFLUENZA VACCINE  06/01/2016  . PNA vac Low Risk Adult (2 of 2 - PPSV23) 11/01/2016  . MAMMOGRAM  05/19/2017  . COLONOSCOPY  06/30/2025  . DEXA SCAN  Completed  . Hepatitis C Screening  Completed      Plan:    cpe During the course of the visit the patient was educated and counseled about the following appropriate screening and preventive services:   Vaccines to include Pneumoccal, Influenza, Hepatitis B, Td, Zostavax, HCV  Electrocardiogram  Cardiovascular Disease  Colorectal cancer screening  Bone density  screening  Diabetes screening  Glaucoma screening  Mammography/PAP  Nutrition counseling   Patient Instructions (the written plan) was given to the patient.  1. Osteoporosis  - DG Bone Density; Future - POCT urinalysis dipstick - Lipid panel - CBC with Differential/Platelet - Comprehensive metabolic panel - Vitamin D (25 hydroxy)  2. Breast cancer screening  - MM DIGITAL SCREENING BILATERAL; Future  3. Drug therapy discontinued  - DG Bone Density; Future  4. Depression with anxiety  - sertraline (ZOLOFT) 50 MG tablet; Take 1 tablet (50 mg total) by mouth daily.  Dispense: 90 tablet; Refill: 3  5. Hyperlipidemia  - POCT urinalysis dipstick - Lipid panel - CBC with Differential/Platelet - Comprehensive metabolic panel  6. Preventative health care   7. Palpitations  - Thyroid Panel With TSH; Future - EKG 12-Lead  8. Routine history and physical examination of adult   Donato SchultzYvonne R Lowne Chase, DO  05/13/2016

## 2016-05-11 NOTE — Patient Instructions (Signed)
Preventive Care for Adults, Female A healthy lifestyle and preventive care can promote health and wellness. Preventive health guidelines for women include the following key practices.  A routine yearly physical is a good way to check with your health care provider about your health and preventive screening. It is a chance to share any concerns and updates on your health and to receive a thorough exam.  Visit your dentist for a routine exam and preventive care every 6 months. Brush your teeth twice a day and floss once a day. Good oral hygiene prevents tooth decay and gum disease.  The frequency of eye exams is based on your age, health, family medical history, use of contact lenses, and other factors. Follow your health care provider's recommendations for frequency of eye exams.  Eat a healthy diet. Foods like vegetables, fruits, whole grains, low-fat dairy products, and lean protein foods contain the nutrients you need without too many calories. Decrease your intake of foods high in solid fats, added sugars, and salt. Eat the right amount of calories for you.Get information about a proper diet from your health care provider, if necessary.  Regular physical exercise is one of the most important things you can do for your health. Most adults should get at least 150 minutes of moderate-intensity exercise (any activity that increases your heart rate and causes you to sweat) each week. In addition, most adults need muscle-strengthening exercises on 2 or more days a week.  Maintain a healthy weight. The body mass index (BMI) is a screening tool to identify possible weight problems. It provides an estimate of body fat based on height and weight. Your health care provider can find your BMI and can help you achieve or maintain a healthy weight.For adults 20 years and older:  A BMI below 18.5 is considered underweight.  A BMI of 18.5 to 24.9 is normal.  A BMI of 25 to 29.9 is considered overweight.  A  BMI of 30 and above is considered obese.  Maintain normal blood lipids and cholesterol levels by exercising and minimizing your intake of saturated fat. Eat a balanced diet with plenty of fruit and vegetables. Blood tests for lipids and cholesterol should begin at age 45 and be repeated every 5 years. If your lipid or cholesterol levels are high, you are over 50, or you are at high risk for heart disease, you may need your cholesterol levels checked more frequently.Ongoing high lipid and cholesterol levels should be treated with medicines if diet and exercise are not working.  If you smoke, find out from your health care provider how to quit. If you do not use tobacco, do not start.  Lung cancer screening is recommended for adults aged 45-80 years who are at high risk for developing lung cancer because of a history of smoking. A yearly low-dose CT scan of the lungs is recommended for people who have at least a 30-pack-year history of smoking and are a current smoker or have quit within the past 15 years. A pack year of smoking is smoking an average of 1 pack of cigarettes a day for 1 year (for example: 1 pack a day for 30 years or 2 packs a day for 15 years). Yearly screening should continue until the smoker has stopped smoking for at least 15 years. Yearly screening should be stopped for people who develop a health problem that would prevent them from having lung cancer treatment.  If you are pregnant, do not drink alcohol. If you are  breastfeeding, be very cautious about drinking alcohol. If you are not pregnant and choose to drink alcohol, do not have more than 1 drink per day. One drink is considered to be 12 ounces (355 mL) of beer, 5 ounces (148 mL) of wine, or 1.5 ounces (44 mL) of liquor.  Avoid use of street drugs. Do not share needles with anyone. Ask for help if you need support or instructions about stopping the use of drugs.  High blood pressure causes heart disease and increases the risk  of stroke. Your blood pressure should be checked at least every 1 to 2 years. Ongoing high blood pressure should be treated with medicines if weight loss and exercise do not work.  If you are 55-79 years old, ask your health care provider if you should take aspirin to prevent strokes.  Diabetes screening is done by taking a blood sample to check your blood glucose level after you have not eaten for a certain period of time (fasting). If you are not overweight and you do not have risk factors for diabetes, you should be screened once every 3 years starting at age 45. If you are overweight or obese and you are 40-70 years of age, you should be screened for diabetes every year as part of your cardiovascular risk assessment.  Breast cancer screening is essential preventive care for women. You should practice "breast self-awareness." This means understanding the normal appearance and feel of your breasts and may include breast self-examination. Any changes detected, no matter how small, should be reported to a health care provider. Women in their 20s and 30s should have a clinical breast exam (CBE) by a health care provider as part of a regular health exam every 1 to 3 years. After age 40, women should have a CBE every year. Starting at age 40, women should consider having a mammogram (breast X-ray test) every year. Women who have a family history of breast cancer should talk to their health care provider about genetic screening. Women at a high risk of breast cancer should talk to their health care providers about having an MRI and a mammogram every year.  Breast cancer gene (BRCA)-related cancer risk assessment is recommended for women who have family members with BRCA-related cancers. BRCA-related cancers include breast, ovarian, tubal, and peritoneal cancers. Having family members with these cancers may be associated with an increased risk for harmful changes (mutations) in the breast cancer genes BRCA1 and  BRCA2. Results of the assessment will determine the need for genetic counseling and BRCA1 and BRCA2 testing.  Your health care provider may recommend that you be screened regularly for cancer of the pelvic organs (ovaries, uterus, and vagina). This screening involves a pelvic examination, including checking for microscopic changes to the surface of your cervix (Pap test). You may be encouraged to have this screening done every 3 years, beginning at age 21.  For women ages 30-65, health care providers may recommend pelvic exams and Pap testing every 3 years, or they may recommend the Pap and pelvic exam, combined with testing for human papilloma virus (HPV), every 5 years. Some types of HPV increase your risk of cervical cancer. Testing for HPV may also be done on women of any age with unclear Pap test results.  Other health care providers may not recommend any screening for nonpregnant women who are considered low risk for pelvic cancer and who do not have symptoms. Ask your health care provider if a screening pelvic exam is right for   you.  If you have had past treatment for cervical cancer or a condition that could lead to cancer, you need Pap tests and screening for cancer for at least 20 years after your treatment. If Pap tests have been discontinued, your risk factors (such as having a new sexual partner) need to be reassessed to determine if screening should resume. Some women have medical problems that increase the chance of getting cervical cancer. In these cases, your health care provider may recommend more frequent screening and Pap tests.  Colorectal cancer can be detected and often prevented. Most routine colorectal cancer screening begins at the age of 50 years and continues through age 75 years. However, your health care provider may recommend screening at an earlier age if you have risk factors for colon cancer. On a yearly basis, your health care provider may provide home test kits to check  for hidden blood in the stool. Use of a small camera at the end of a tube, to directly examine the colon (sigmoidoscopy or colonoscopy), can detect the earliest forms of colorectal cancer. Talk to your health care provider about this at age 50, when routine screening begins. Direct exam of the colon should be repeated every 5-10 years through age 75 years, unless early forms of precancerous polyps or small growths are found.  People who are at an increased risk for hepatitis B should be screened for this virus. You are considered at high risk for hepatitis B if:  You were born in a country where hepatitis B occurs often. Talk with your health care provider about which countries are considered high risk.  Your parents were born in a high-risk country and you have not received a shot to protect against hepatitis B (hepatitis B vaccine).  You have HIV or AIDS.  You use needles to inject street drugs.  You live with, or have sex with, someone who has hepatitis B.  You get hemodialysis treatment.  You take certain medicines for conditions like cancer, organ transplantation, and autoimmune conditions.  Hepatitis C blood testing is recommended for all people born from 1945 through 1965 and any individual with known risks for hepatitis C.  Practice safe sex. Use condoms and avoid high-risk sexual practices to reduce the spread of sexually transmitted infections (STIs). STIs include gonorrhea, chlamydia, syphilis, trichomonas, herpes, HPV, and human immunodeficiency virus (HIV). Herpes, HIV, and HPV are viral illnesses that have no cure. They can result in disability, cancer, and death.  You should be screened for sexually transmitted illnesses (STIs) including gonorrhea and chlamydia if:  You are sexually active and are younger than 24 years.  You are older than 24 years and your health care provider tells you that you are at risk for this type of infection.  Your sexual activity has changed  since you were last screened and you are at an increased risk for chlamydia or gonorrhea. Ask your health care provider if you are at risk.  If you are at risk of being infected with HIV, it is recommended that you take a prescription medicine daily to prevent HIV infection. This is called preexposure prophylaxis (PrEP). You are considered at risk if:  You are sexually active and do not regularly use condoms or know the HIV status of your partner(s).  You take drugs by injection.  You are sexually active with a partner who has HIV.  Talk with your health care provider about whether you are at high risk of being infected with HIV. If   you choose to begin PrEP, you should first be tested for HIV. You should then be tested every 3 months for as long as you are taking PrEP.  Osteoporosis is a disease in which the bones lose minerals and strength with aging. This can result in serious bone fractures or breaks. The risk of osteoporosis can be identified using a bone density scan. Women ages 67 years and over and women at risk for fractures or osteoporosis should discuss screening with their health care providers. Ask your health care provider whether you should take a calcium supplement or vitamin D to reduce the rate of osteoporosis.  Menopause can be associated with physical symptoms and risks. Hormone replacement therapy is available to decrease symptoms and risks. You should talk to your health care provider about whether hormone replacement therapy is right for you.  Use sunscreen. Apply sunscreen liberally and repeatedly throughout the day. You should seek shade when your shadow is shorter than you. Protect yourself by wearing long sleeves, pants, a wide-brimmed hat, and sunglasses year round, whenever you are outdoors.  Once a month, do a whole body skin exam, using a mirror to look at the skin on your back. Tell your health care provider of new moles, moles that have irregular borders, moles that  are larger than a pencil eraser, or moles that have changed in shape or color.  Stay current with required vaccines (immunizations).  Influenza vaccine. All adults should be immunized every year.  Tetanus, diphtheria, and acellular pertussis (Td, Tdap) vaccine. Pregnant women should receive 1 dose of Tdap vaccine during each pregnancy. The dose should be obtained regardless of the length of time since the last dose. Immunization is preferred during the 27th-36th week of gestation. An adult who has not previously received Tdap or who does not know her vaccine status should receive 1 dose of Tdap. This initial dose should be followed by tetanus and diphtheria toxoids (Td) booster doses every 10 years. Adults with an unknown or incomplete history of completing a 3-dose immunization series with Td-containing vaccines should begin or complete a primary immunization series including a Tdap dose. Adults should receive a Td booster every 10 years.  Varicella vaccine. An adult without evidence of immunity to varicella should receive 2 doses or a second dose if she has previously received 1 dose. Pregnant females who do not have evidence of immunity should receive the first dose after pregnancy. This first dose should be obtained before leaving the health care facility. The second dose should be obtained 4-8 weeks after the first dose.  Human papillomavirus (HPV) vaccine. Females aged 13-26 years who have not received the vaccine previously should obtain the 3-dose series. The vaccine is not recommended for use in pregnant females. However, pregnancy testing is not needed before receiving a dose. If a female is found to be pregnant after receiving a dose, no treatment is needed. In that case, the remaining doses should be delayed until after the pregnancy. Immunization is recommended for any person with an immunocompromised condition through the age of 61 years if she did not get any or all doses earlier. During the  3-dose series, the second dose should be obtained 4-8 weeks after the first dose. The third dose should be obtained 24 weeks after the first dose and 16 weeks after the second dose.  Zoster vaccine. One dose is recommended for adults aged 30 years or older unless certain conditions are present.  Measles, mumps, and rubella (MMR) vaccine. Adults born  before 1957 generally are considered immune to measles and mumps. Adults born in 1957 or later should have 1 or more doses of MMR vaccine unless there is a contraindication to the vaccine or there is laboratory evidence of immunity to each of the three diseases. A routine second dose of MMR vaccine should be obtained at least 28 days after the first dose for students attending postsecondary schools, health care workers, or international travelers. People who received inactivated measles vaccine or an unknown type of measles vaccine during 1963-1967 should receive 2 doses of MMR vaccine. People who received inactivated mumps vaccine or an unknown type of mumps vaccine before 1979 and are at high risk for mumps infection should consider immunization with 2 doses of MMR vaccine. For females of childbearing age, rubella immunity should be determined. If there is no evidence of immunity, females who are not pregnant should be vaccinated. If there is no evidence of immunity, females who are pregnant should delay immunization until after pregnancy. Unvaccinated health care workers born before 1957 who lack laboratory evidence of measles, mumps, or rubella immunity or laboratory confirmation of disease should consider measles and mumps immunization with 2 doses of MMR vaccine or rubella immunization with 1 dose of MMR vaccine.  Pneumococcal 13-valent conjugate (PCV13) vaccine. When indicated, a person who is uncertain of his immunization history and has no record of immunization should receive the PCV13 vaccine. All adults 65 years of age and older should receive this  vaccine. An adult aged 19 years or older who has certain medical conditions and has not been previously immunized should receive 1 dose of PCV13 vaccine. This PCV13 should be followed with a dose of pneumococcal polysaccharide (PPSV23) vaccine. Adults who are at high risk for pneumococcal disease should obtain the PPSV23 vaccine at least 8 weeks after the dose of PCV13 vaccine. Adults older than 67 years of age who have normal immune system function should obtain the PPSV23 vaccine dose at least 1 year after the dose of PCV13 vaccine.  Pneumococcal polysaccharide (PPSV23) vaccine. When PCV13 is also indicated, PCV13 should be obtained first. All adults aged 65 years and older should be immunized. An adult younger than age 65 years who has certain medical conditions should be immunized. Any person who resides in a nursing home or long-term care facility should be immunized. An adult smoker should be immunized. People with an immunocompromised condition and certain other conditions should receive both PCV13 and PPSV23 vaccines. People with human immunodeficiency virus (HIV) infection should be immunized as soon as possible after diagnosis. Immunization during chemotherapy or radiation therapy should be avoided. Routine use of PPSV23 vaccine is not recommended for American Indians, [place] Natives, or people younger than 65 years unless there are medical conditions that require PPSV23 vaccine. When indicated, people who have unknown immunization and have no record of immunization should receive PPSV23 vaccine. One-time revaccination 5 years after the first dose of PPSV23 is recommended for people aged 19-64 years who have chronic kidney failure, nephrotic syndrome, asplenia, or immunocompromised conditions. People who received 1-2 doses of PPSV23 before age 65 years should receive another dose of PPSV23 vaccine at age 65 years or later if at least 5 years have passed since the previous dose. Doses of PPSV23 are not  needed for people immunized with PPSV23 at or after age 65 years.  Meningococcal vaccine. Adults with asplenia or persistent complement component deficiencies should receive 2 doses of quadrivalent meningococcal conjugate (MenACWY-D) vaccine. The doses should be obtained   at least 2 months apart. Microbiologists working with certain meningococcal bacteria, [place] recruits, people at risk during an outbreak, and people who travel to or live in countries with a high rate of meningitis should be immunized. A first-year college student up through age 34 years who is living in a residence hall should receive a dose if she did not receive a dose on or after her 16th birthday. Adults who have certain high-risk conditions should receive one or more doses of vaccine.  Hepatitis A vaccine. Adults who wish to be protected from this disease, have certain high-risk conditions, work with hepatitis A-infected animals, work in hepatitis A research labs, or travel to or work in countries with a high rate of hepatitis A should be immunized. Adults who were previously unvaccinated and who anticipate close contact with an international adoptee during the first 60 days after arrival in the [place] from a country with a high rate of hepatitis A should be immunized.  Hepatitis B vaccine. Adults who wish to be protected from this disease, have certain high-risk conditions, may be exposed to blood or other infectious body fluids, are household contacts or sex partners of hepatitis B positive people, are clients or workers in certain care facilities, or travel to or work in countries with a high rate of hepatitis B should be immunized.  Haemophilus influenzae type b (Hib) vaccine. A previously unvaccinated person with asplenia or sickle cell disease or having a scheduled splenectomy should receive 1 dose of Hib vaccine. Regardless of previous immunization, a recipient of a hematopoietic stem cell transplant should receive a  3-dose series 6-12 months after her successful transplant. Hib vaccine is not recommended for adults with HIV infection. Preventive Services / Frequency Ages 35 to 4 years  Blood pressure check.** / Every 3-5 years.  Lipid and cholesterol check.** / Every 5 years beginning at age 60.  Clinical breast exam.** / Every 3 years for women in their 71s and 10s.  BRCA-related cancer risk assessment.** / For women who have family members with a BRCA-related cancer (breast, ovarian, tubal, or peritoneal cancers).  Pap test.** / Every 2 years from ages 76 through 26. Every 3 years starting at age 61 through age 76 or 93 with a history of 3 consecutive normal Pap tests.  HPV screening.** / Every 3 years from ages 37 through ages 60 to 51 with a history of 3 consecutive normal Pap tests.  Hepatitis C blood test.** / For any individual with known risks for hepatitis C.  Skin self-exam. / Monthly.  Influenza vaccine. / Every year.  Tetanus, diphtheria, and acellular pertussis (Tdap, Td) vaccine.** / Consult your health care provider. Pregnant women should receive 1 dose of Tdap vaccine during each pregnancy. 1 dose of Td every 10 years.  Varicella vaccine.** / Consult your health care provider. Pregnant females who do not have evidence of immunity should receive the first dose after pregnancy.  HPV vaccine. / 3 doses over 6 months, if 93 and younger. The vaccine is not recommended for use in pregnant females. However, pregnancy testing is not needed before receiving a dose.  Measles, mumps, rubella (MMR) vaccine.** / You need at least 1 dose of MMR if you were born in 1957 or later. You may also need a 2nd dose. For females of childbearing age, rubella immunity should be determined. If there is no evidence of immunity, females who are not pregnant should be vaccinated. If there is no evidence of immunity, females who are  pregnant should delay immunization until after pregnancy.  Pneumococcal  13-valent conjugate (PCV13) vaccine.** / Consult your health care provider.  Pneumococcal polysaccharide (PPSV23) vaccine.** / 1 to 2 doses if you smoke cigarettes or if you have certain conditions.  Meningococcal vaccine.** / 1 dose if you are age 68 to 8 years and a Market researcher living in a residence hall, or have one of several medical conditions, you need to get vaccinated against meningococcal disease. You may also need additional booster doses.  Hepatitis A vaccine.** / Consult your health care provider.  Hepatitis B vaccine.** / Consult your health care provider.  Haemophilus influenzae type b (Hib) vaccine.** / Consult your health care provider. Ages 7 to 53 years  Blood pressure check.** / Every year.  Lipid and cholesterol check.** / Every 5 years beginning at age 25 years.  Lung cancer screening. / Every year if you are aged 11-80 years and have a 30-pack-year history of smoking and currently smoke or have quit within the past 15 years. Yearly screening is stopped once you have quit smoking for at least 15 years or develop a health problem that would prevent you from having lung cancer treatment.  Clinical breast exam.** / Every year after age 48 years.  BRCA-related cancer risk assessment.** / For women who have family members with a BRCA-related cancer (breast, ovarian, tubal, or peritoneal cancers).  Mammogram.** / Every year beginning at age 41 years and continuing for as long as you are in good health. Consult with your health care provider.  Pap test.** / Every 3 years starting at age 65 years through age 37 or 70 years with a history of 3 consecutive normal Pap tests.  HPV screening.** / Every 3 years from ages 72 years through ages 60 to 40 years with a history of 3 consecutive normal Pap tests.  Fecal occult blood test (FOBT) of stool. / Every year beginning at age 21 years and continuing until age 5 years. You may not need to do this test if you get  a colonoscopy every 10 years.  Flexible sigmoidoscopy or colonoscopy.** / Every 5 years for a flexible sigmoidoscopy or every 10 years for a colonoscopy beginning at age 35 years and continuing until age 48 years.  Hepatitis C blood test.** / For all people born from [birth year] through 1965 and any individual with known risks for hepatitis C.  Skin self-exam. / Monthly.  Influenza vaccine. / Every year.  Tetanus, diphtheria, and acellular pertussis (Tdap/Td) vaccine.** / Consult your health care provider. Pregnant women should receive 1 dose of Tdap vaccine during each pregnancy. 1 dose of Td every 10 years.  Varicella vaccine.** / Consult your health care provider. Pregnant females who do not have evidence of immunity should receive the first dose after pregnancy.  Zoster vaccine.** / 1 dose for adults aged 30 years or older.  Measles, mumps, rubella (MMR) vaccine.** / You need at least 1 dose of MMR if you were born in 1957 or later. You may also need a second dose. For females of childbearing age, rubella immunity should be determined. If there is no evidence of immunity, females who are not pregnant should be vaccinated. If there is no evidence of immunity, females who are pregnant should delay immunization until after pregnancy.  Pneumococcal 13-valent conjugate (PCV13) vaccine.** / Consult your health care provider.  Pneumococcal polysaccharide (PPSV23) vaccine.** / 1 to 2 doses if you smoke cigarettes or if you have certain conditions.  Meningococcal vaccine.** /  Consult your health care provider.  Hepatitis A vaccine.** / Consult your health care provider.  Hepatitis B vaccine.** / Consult your health care provider.  Haemophilus influenzae type b (Hib) vaccine.** / Consult your health care provider. Ages 64 years and over  Blood pressure check.** / Every year.  Lipid and cholesterol check.** / Every 5 years beginning at age 23 years.  Lung cancer screening. / Every year if you  are aged 16-80 years and have a 30-pack-year history of smoking and currently smoke or have quit within the past 15 years. Yearly screening is stopped once you have quit smoking for at least 15 years or develop a health problem that would prevent you from having lung cancer treatment.  Clinical breast exam.** / Every year after age 74 years.  BRCA-related cancer risk assessment.** / For women who have family members with a BRCA-related cancer (breast, ovarian, tubal, or peritoneal cancers).  Mammogram.** / Every year beginning at age 44 years and continuing for as long as you are in good health. Consult with your health care provider.  Pap test.** / Every 3 years starting at age 58 years through age 22 or 39 years with 3 consecutive normal Pap tests. Testing can be stopped between 65 and 70 years with 3 consecutive normal Pap tests and no abnormal Pap or HPV tests in the past 10 years.  HPV screening.** / Every 3 years from ages 64 years through ages 70 or 61 years with a history of 3 consecutive normal Pap tests. Testing can be stopped between 65 and 70 years with 3 consecutive normal Pap tests and no abnormal Pap or HPV tests in the past 10 years.  Fecal occult blood test (FOBT) of stool. / Every year beginning at age 40 years and continuing until age 27 years. You may not need to do this test if you get a colonoscopy every 10 years.  Flexible sigmoidoscopy or colonoscopy.** / Every 5 years for a flexible sigmoidoscopy or every 10 years for a colonoscopy beginning at age 7 years and continuing until age 32 years.  Hepatitis C blood test.** / For all people born from [birth year] through 1965 and any individual with known risks for hepatitis C.  Osteoporosis screening.** / A one-time screening for women ages 30 years and over and women at risk for fractures or osteoporosis.  Skin self-exam. / Monthly.  Influenza vaccine. / Every year.  Tetanus, diphtheria, and acellular pertussis (Tdap/Td)  vaccine.** / 1 dose of Td every 10 years.  Varicella vaccine.** / Consult your health care provider.  Zoster vaccine.** / 1 dose for adults aged 35 years or older.  Pneumococcal 13-valent conjugate (PCV13) vaccine.** / Consult your health care provider.  Pneumococcal polysaccharide (PPSV23) vaccine.** / 1 dose for all adults aged 46 years and older.  Meningococcal vaccine.** / Consult your health care provider.  Hepatitis A vaccine.** / Consult your health care provider.  Hepatitis B vaccine.** / Consult your health care provider.  Haemophilus influenzae type b (Hib) vaccine.** / Consult your health care provider. ** Family history and personal history of risk and conditions may change your health care provider's recommendations.   This information is not intended to replace advice given to you by your health care provider. Make sure you discuss any questions you have with your health care provider.   Document Released: 12/14/2001 Document Revised: 11/08/2014 Document Reviewed: 03/15/2011 Elsevier Interactive Patient Education [employer].

## 2016-05-11 NOTE — Telephone Encounter (Signed)
Relation to ZO:XWRUpt:self Call back [phone] Pharmacy:  Reason for call:  Patient requesting lab results mailed to home after resulted to her. Please note

## 2016-05-11 NOTE — Progress Notes (Signed)
Pre visit review using our clinic review tool, if applicable. No additional management support is needed unless otherwise documented below in the visit note. 

## 2016-06-07 ENCOUNTER — Other Ambulatory Visit: Payer: Self-pay | Admitting: Family Medicine

## 2016-06-07 DIAGNOSIS — Z1231 Encounter for screening mammogram for malignant neoplasm of breast: Secondary | ICD-10-CM

## 2016-06-29 ENCOUNTER — Ambulatory Visit: Payer: PPO

## 2016-07-13 ENCOUNTER — Ambulatory Visit: Payer: PPO

## 2016-07-28 ENCOUNTER — Ambulatory Visit
Admission: RE | Admit: 2016-07-28 | Discharge: 2016-07-28 | Disposition: A | Discharge status: Home / Self Care | Payer: PPO | Source: Ambulatory Visit | Attending: Family Medicine | Admitting: Family Medicine

## 2016-07-28 DIAGNOSIS — Z1231 Encounter for screening mammogram for malignant neoplasm of breast: Secondary | ICD-10-CM

## 2016-08-17 ENCOUNTER — Other Ambulatory Visit: Payer: PPO

## 2017-03-03 ENCOUNTER — Telehealth: Payer: Self-pay | Admitting: Family Medicine

## 2017-03-03 DIAGNOSIS — M810 Age-related osteoporosis without current pathological fracture: Secondary | ICD-10-CM

## 2017-03-03 NOTE — Telephone Encounter (Signed)
Caller name: Debbe OdeaLatisha with [facility] Relationship to patient: Can be reached: [phone] Pharmacy:  Reason for call: Please enter new order for bone density. Pt not due until after 05/19/2017 and the order in the system will expire before pt is due.

## 2017-03-04 NOTE — Telephone Encounter (Signed)
Done

## 2017-03-22 ENCOUNTER — Telehealth: Payer: Self-pay | Admitting: Family Medicine

## 2017-03-22 NOTE — Telephone Encounter (Signed)
Called patient to schedule awv. Lvm for patient to call office to schedule appt.  °

## 2017-05-17 ENCOUNTER — Encounter: Payer: Self-pay | Admitting: Family Medicine

## 2017-05-17 ENCOUNTER — Telehealth: Payer: Self-pay | Admitting: Family Medicine

## 2017-05-17 ENCOUNTER — Ambulatory Visit (INDEPENDENT_AMBULATORY_CARE_PROVIDER_SITE_OTHER): Payer: 59 | Admitting: Family Medicine

## 2017-05-17 VITALS — BP 106/60 | HR 68 | Temp 98.2°F | Resp 16 | Ht 68.0 in | Wt 134.0 lb

## 2017-05-17 DIAGNOSIS — Z87448 Personal history of other diseases of urinary system: Secondary | ICD-10-CM

## 2017-05-17 DIAGNOSIS — Z862 Personal history of diseases of the blood and blood-forming organs and certain disorders involving the immune mechanism: Secondary | ICD-10-CM | POA: Diagnosis not present

## 2017-05-17 DIAGNOSIS — R6889 Other general symptoms and signs: Secondary | ICD-10-CM

## 2017-05-17 DIAGNOSIS — F418 Other specified anxiety disorders: Secondary | ICD-10-CM | POA: Diagnosis not present

## 2017-05-17 LAB — POC URINALSYSI DIPSTICK (AUTOMATED)
Blood, UA: NEGATIVE
LEUKOCYTES UA: NEGATIVE
Nitrite, UA: NEGATIVE
PH UA: 6 (ref 5.0–8.0)
Protein, UA: NEGATIVE
Spec Grav, UA: 1.025 (ref 1.010–1.025)
Urobilinogen, UA: 0.2 E.U./dL

## 2017-05-17 LAB — COMPREHENSIVE METABOLIC PANEL
ALT: 17 U/L (ref 0–35)
AST: 20 U/L (ref 0–37)
Albumin: 4.3 g/dL (ref 3.5–5.2)
Alkaline Phosphatase: 86 U/L (ref 39–117)
BUN: 16 mg/dL (ref 6–23)
CO2: 26 meq/L (ref 19–32)
Calcium: 9.6 mg/dL (ref 8.4–10.5)
Chloride: 106 mEq/L (ref 96–112)
Creatinine, Ser: 0.79 mg/dL (ref 0.40–1.20)
GFR: 76.86 mL/min (ref >60.00)
Glucose, Bld: 94 mg/dL (ref 70–99)
POTASSIUM: 4.8 meq/L (ref 3.5–5.1)
Sodium: 142 mEq/L (ref 135–145)
Total Bilirubin: 0.4 mg/dL (ref 0.2–1.2)
Total Protein: 7 g/dL (ref 6.0–8.3)

## 2017-05-17 LAB — LIPID PANEL
Cholesterol: 229 mg/dL — ABNORMAL HIGH (ref 0–200)
HDL: 49.9 mg/dL (ref >39.00)
LDL Cholesterol: 143 mg/dL — ABNORMAL HIGH (ref 0–99)
NonHDL: 178.68
Total CHOL/HDL Ratio: 5
Triglycerides: 177 mg/dL — ABNORMAL HIGH (ref 0.0–149.0)
VLDL: 35.4 mg/dL (ref 0.0–40.0)

## 2017-05-17 LAB — CBC WITH DIFFERENTIAL/PLATELET
Basophils Absolute: 0.1 10*3/uL (ref 0.0–0.1)
Basophils Relative: 0.6 % (ref 0.0–3.0)
EOS PCT: 2.1 % (ref 0.0–5.0)
Eosinophils Absolute: 0.2 10*3/uL (ref 0.0–0.7)
HCT: 39.1 % (ref 36.0–46.0)
Hemoglobin: 13.1 g/dL (ref 12.0–15.0)
LYMPHS ABS: 2.6 10*3/uL (ref 0.7–4.0)
Lymphocytes Relative: 28.5 % (ref 12.0–46.0)
MCHC: 33.6 g/dL (ref 30.0–36.0)
MCV: 94.8 fl (ref 78.0–100.0)
MONO ABS: 0.7 10*3/uL (ref 0.1–1.0)
Monocytes Relative: 7.2 % (ref 3.0–12.0)
NEUTROS ABS: 5.6 10*3/uL (ref 1.4–7.7)
NEUTROS PCT: 61.6 % (ref 43.0–77.0)
PLATELETS: 293 10*3/uL (ref 150.0–400.0)
RBC: 4.13 Mil/uL (ref 3.87–5.11)
RDW: 13.8 % (ref 11.5–15.5)
WBC: 9.2 10*3/uL (ref 4.0–10.5)

## 2017-05-17 LAB — TSH: TSH: 3.84 u[IU]/mL (ref 0.35–4.50)

## 2017-05-17 MED ORDER — SERTRALINE HCL 50 MG PO TABS: 50.0000 mg | ORAL_TABLET | Freq: Every day | ORAL | 3 refills | Status: DC

## 2017-05-17 NOTE — Telephone Encounter (Signed)
Per AVS instructions for checkout -Return in about 6 months (around 11/17/2017  Pt declined scheduling FU she said that she's not sure if insurance will cover. She would like to only schedule CPE.

## 2017-05-17 NOTE — Progress Notes (Signed)
Subjective:   I acted as a Neurosurgeon for Dr. Zola Button.  Apolonio Schneiders, CMA   Donna Tapia is a 66 y.o. female and is here for a comprehensive physical exam. The patient reports having a cold sensitivity-- no other complaints.    Social History   Social History  . Marital status: Single    Spouse name: N/A  . Number of children: N/A  . Years of education: N/A   Occupational History  . retired    Social History Main Topics  . Smoking status: Never Smoker  . Smokeless tobacco: Never Used  . Alcohol use No  . Drug use: No  . Sexual activity: No   Other Topics Concern  . Not on file   Social History Narrative   Exercise- walks      Health Maintenance  Topic Date Due  . [name]  02/13/1970  . PNA vac Low Risk Adult (2 of 2 - PPSV23) 11/01/2016  . DEXA SCAN  05/19/2017  . INFLUENZA VACCINE  06/01/2017  . MAMMOGRAM  07/28/2017  . COLONOSCOPY  06/30/2025  . Hepatitis C Screening  Completed    The following portions of the patient's history were reviewed and updated as appropriate:  She  has a past medical history of Anemia; Chicken pox; Environmental allergies; Hepatitis B (1978); and Hyperlipidemia. She  does not have any pertinent problems on file. She  has a past surgical history that includes Abdominal hysterectomy; Evisceration (01/07/2012); TEE without cardioversion (01/10/2012); Lumbar laminectomy/decompression microdiscectomy (01/04/2012); and Appendectomy. Her family history includes Colon cancer in her father and mother; Coronary artery disease in her mother; Heart disease in her unknown relative; Hyperlipidemia in her paternal aunt and paternal uncle; Hypertension in her unknown relative; Parkinsonism in her mother. She was adopted. She  reports that she has never smoked. She has never used smokeless tobacco. She reports that she does not drink alcohol or use drugs. She has a current medication list which includes the following prescription(s): acetaminophen, artificial  tears, calcium carbonate, vitamin d3, coenzyme q10, folic acid, multivitamin with minerals, fish oil, and sertraline. Current Outpatient Prescriptions on File Prior to Visit  Medication Sig Dispense Refill  . acetaminophen (TYLENOL) 325 MG tablet Take 325 mg by mouth every 4 (four) hours as needed. For pain     . artificial tears (LACRILUBE) OINT ophthalmic ointment Apply to eye 4 (four) times daily. 1 Tube 0  . calcium carbonate (OS-CAL - DOSED IN MG OF ELEMENTAL CALCIUM) 1250 (500 Ca) MG tablet Take 1 tablet by mouth daily with breakfast.    . CO ENZYME Q-10 PO Take 1 capsule by mouth daily.    . folic acid (FOLVITE) 400 MCG tablet Take 400 mcg by mouth 3 (three) times a week.     . Multiple Vitamin (MULITIVITAMIN WITH MINERALS) TABS Take 1 tablet by mouth daily.    . Omega-3 Fatty Acids (FISH OIL) 1000 MG CAPS Take 1 capsule by mouth daily.     No current facility-administered medications on file prior to visit.    She is allergic to sulfur..  Review of Systems Review of Systems  Constitutional: Negative for activity change, appetite change and fatigue.  HENT: Negative for hearing loss, congestion, tinnitus and ear discharge.  dentist q51m Eyes: Negative for visual disturbance (see optho q1y -- vision corrected to 20/20 with glasses).  Respiratory: Negative for cough, chest tightness and shortness of breath.   Cardiovascular: Negative for chest pain, palpitations and leg swelling.  Gastrointestinal: Negative for abdominal  pain, diarrhea, constipation and abdominal distention.  Genitourinary: Negative for urgency, frequency, decreased urine volume and difficulty urinating.  Musculoskeletal: Negative for back pain, arthralgias and gait problem.  Skin: Negative for color change, pallor and rash.  Neurological: Negative for dizziness, light-headedness, numbness and headaches.  Hematological: Negative for adenopathy. Does not bruise/bleed easily.  Psychiatric/Behavioral: Negative for  suicidal ideas, confusion, sleep disturbance, self-injury, dysphoric mood, decreased concentration and agitation.       Objective:    BP 106/60 (BP Location: Right Arm, Cuff Size: Normal)   Pulse 68   Temp 98.2 F (36.8 C) (Oral)   Resp 16   Ht 5\' 8"  (1.727 m)   Wt 134 lb (60.8 kg)   SpO2 97%   BMI 20.37 kg/m  General appearance: alert, cooperative, appears stated age and no distress Head: Normocephalic, without obvious abnormality, atraumatic Eyes: conjunctivae/corneas clear. PERRL, EOM's intact. Fundi benign. Ears: normal TM's and external ear canals both ears Nose: Nares normal. Septum midline. Mucosa normal. No drainage or sinus tenderness. Throat: lips, mucosa, and tongue normal; teeth and gums normal Neck: no adenopathy, no carotid bruit, no JVD, supple, symmetrical, trachea midline and thyroid not enlarged, symmetric, no tenderness/mass/nodules Back: + scoliosis Lungs: clear to auscultation bilaterally Breasts: normal appearance, no masses or tenderness Heart: regular rate and rhythm, S1, S2 normal, no murmur, click, rub or gallop Abdomen: soft, non-tender; bowel sounds normal; no masses,  no organomegaly Pelvic: not indicated; status post hysterectomy, negative ROS Extremities: extremities normal, atraumatic, no cyanosis or edema Pulses: 2+ and symmetric Skin: Skin color, texture, turgor normal. No rashes or lesions Lymph nodes: Cervical, supraclavicular, and axillary nodes normal. Neurologic: Alert and oriented X 3, normal strength and tone. Normal symmetric reflexes. Normal coordination and gait    Assessment:    Healthy female exam.      Plan:    ghm utd Check labs  See After Visit Summary for Counseling Recommendations   Pt will discuss Tdap and shingrix with pharmacy  1. Depression with anxiety stable - sertraline (ZOLOFT) 50 MG tablet; Take 1 tablet (50 mg total) by mouth daily.  Dispense: 90 tablet; Refill: 3  2. History of anemia Check labs  - CBC  with Differential/Platelet - POCT Urinalysis Dipstick (Automated) - sertraline (ZOLOFT) 50 MG tablet; Take 1 tablet (50 mg total) by mouth daily.  Dispense: 90 tablet; Refill: 3  3. History of kidney problems Check labs - CBC with Differential/Platelet - Comprehensive metabolic panel - Lipid panel - POCT Urinalysis Dipstick (Automated)

## 2017-05-17 NOTE — Patient Instructions (Addendum)
Preventive Care 65 Years and Older, Female Preventive care refers to lifestyle choices and visits with your health care provider that can promote health and wellness. What does preventive care include?  A yearly physical exam. This is also called an annual well check.  Dental exams once or twice a year.  Routine eye exams. Ask your health care provider how often you should have your eyes checked.  Personal lifestyle choices, including: ? Daily care of your teeth and gums. ? Regular physical activity. ? Eating a healthy diet. ? Avoiding tobacco and drug use. ? Limiting alcohol use. ? Practicing safe sex. ? Taking low-dose aspirin every day. ? Taking vitamin and mineral supplements as recommended by your health care provider. What happens during an annual well check? The services and screenings done by your health care provider during your annual well check will depend on your age, overall health, lifestyle risk factors, and family history of disease. Counseling Your health care provider may ask you questions about your:  Alcohol use.  Tobacco use.  Drug use.  Emotional well-being.  Home and relationship well-being.  Sexual activity.  Eating habits.  History of falls.  Memory and ability to understand (cognition).  Work and work environment.  Reproductive health.  Screening You may have the following tests or measurements:  Height, weight, and BMI.  Blood pressure.  Lipid and cholesterol levels. These may be checked every 5 years, or more frequently if you are over 50 years old.  Skin check.  Lung cancer screening. You may have this screening every year starting at age 55 if you have a 30-pack-year history of smoking and currently smoke or have quit within the past 15 years.  Fecal occult blood test (FOBT) of the stool. You may have this test every year starting at age 50.  Flexible sigmoidoscopy or colonoscopy. You may have a sigmoidoscopy every 5 years or  a colonoscopy every 10 years starting at age 50.  Hepatitis C blood test.  Hepatitis B blood test.  Sexually transmitted disease (STD) testing.  Diabetes screening. This is done by checking your blood sugar (glucose) after you have not eaten for a while (fasting). You may have this done every 1-3 years.  Bone density scan. This is done to screen for osteoporosis. You may have this done starting at age 65.  Mammogram. This may be done every 1-2 years. Talk to your health care provider about how often you should have regular mammograms.  Talk with your health care provider about your test results, treatment options, and if necessary, the need for more tests. Vaccines Your health care provider may recommend certain vaccines, such as:  Influenza vaccine. This is recommended every year.  Tetanus, diphtheria, and acellular pertussis (Tdap, Td) vaccine. You may need a Td booster every 10 years.  Varicella vaccine. You may need this if you have not been vaccinated.  Zoster vaccine. You may need this after age 60.  Measles, mumps, and rubella (MMR) vaccine. You may need at least one dose of MMR if you were born in 1957 or later. You may also need a second dose.  Pneumococcal 13-valent conjugate (PCV13) vaccine. One dose is recommended after age 65.  Pneumococcal polysaccharide (PPSV23) vaccine. One dose is recommended after age 65.  Meningococcal vaccine. You may need this if you have certain conditions.  Hepatitis A vaccine. You may need this if you have certain conditions or if you travel or work in places where you may be exposed to hepatitis   A.  Hepatitis B vaccine. You may need this if you have certain conditions or if you travel or work in places where you may be exposed to hepatitis B.  Haemophilus influenzae type b (Hib) vaccine. You may need this if you have certain conditions.  Talk to your health care provider about which screenings and vaccines you need and how often you  need them. This information is not intended to replace advice given to you by your health care provider. Make sure you discuss any questions you have with your health care provider. Document Released: 11/14/2015 Document Revised: 07/07/2016 Document Reviewed: 08/19/2015 Elsevier Interactive Patient Education  2017 [employer].

## 2017-05-24 ENCOUNTER — Telehealth: Payer: Self-pay | Admitting: Family Medicine

## 2017-05-24 NOTE — Telephone Encounter (Signed)
Pt called in to request results for her labs and UA be mailed to her at the address on file.

## 2017-05-25 ENCOUNTER — Encounter: Payer: Self-pay | Admitting: *Deleted

## 2017-05-25 NOTE — Telephone Encounter (Signed)
Patient stated that she has taken a statin in the past and did not want to try Pravachol.  She will work on diet and exercise and try some otc fish oil, red yeast rice, and flaxseed oil.  Copy mailed to patient.

## 2017-06-14 ENCOUNTER — Other Ambulatory Visit: Payer: Self-pay | Admitting: Family Medicine

## 2017-06-14 ENCOUNTER — Encounter: Payer: Self-pay | Admitting: Family Medicine

## 2017-06-14 ENCOUNTER — Ambulatory Visit (HOSPITAL_BASED_OUTPATIENT_CLINIC_OR_DEPARTMENT_OTHER)
Admission: RE | Admit: 2017-06-14 | Discharge: 2017-06-14 | Disposition: A | Discharge status: Home / Self Care | Payer: 59 | Source: Ambulatory Visit | Attending: Family Medicine | Admitting: Family Medicine

## 2017-06-14 ENCOUNTER — Ambulatory Visit (INDEPENDENT_AMBULATORY_CARE_PROVIDER_SITE_OTHER): Payer: 59 | Admitting: Family Medicine

## 2017-06-14 ENCOUNTER — Telehealth: Payer: Self-pay | Admitting: Emergency Medicine

## 2017-06-14 VITALS — BP 104/64 | HR 83 | Temp 99.3°F | Ht 68.0 in | Wt 132.5 lb

## 2017-06-14 DIAGNOSIS — R509 Fever, unspecified: Secondary | ICD-10-CM | POA: Diagnosis not present

## 2017-06-14 DIAGNOSIS — D72829 Elevated white blood cell count, unspecified: Secondary | ICD-10-CM

## 2017-06-14 DIAGNOSIS — R918 Other nonspecific abnormal finding of lung field: Secondary | ICD-10-CM | POA: Diagnosis not present

## 2017-06-14 DIAGNOSIS — J189 Pneumonia, unspecified organism: Secondary | ICD-10-CM

## 2017-06-14 DIAGNOSIS — R05 Cough: Secondary | ICD-10-CM | POA: Insufficient documentation

## 2017-06-14 DIAGNOSIS — R059 Cough, unspecified: Secondary | ICD-10-CM

## 2017-06-14 LAB — CBC WITH DIFFERENTIAL/PLATELET
Basophils Absolute: 0.1 10*3/uL (ref 0.0–0.1)
Basophils Relative: 0.3 % (ref 0.0–3.0)
EOS ABS: 0 10*3/uL (ref 0.0–0.7)
Eosinophils Relative: 0.2 % (ref 0.0–5.0)
HEMATOCRIT: 29.5 % — AB (ref 36.0–46.0)
HEMOGLOBIN: 9.7 g/dL — AB (ref 12.0–15.0)
Lymphocytes Relative: 5.5 % — ABNORMAL LOW (ref 12.0–46.0)
Lymphs Abs: 1.1 10*3/uL (ref 0.7–4.0)
MCHC: 33.1 g/dL (ref 30.0–36.0)
MCV: 96 fl (ref 78.0–100.0)
Monocytes Absolute: 1 10*3/uL (ref 0.1–1.0)
Monocytes Relative: 5 % (ref 3.0–12.0)
NEUTROS ABS: 17.9 10*3/uL — AB (ref 1.4–7.7)
Neutrophils Relative %: 89 % — ABNORMAL HIGH (ref 43.0–77.0)
Platelets: 410 10*3/uL — ABNORMAL HIGH (ref 150.0–400.0)
RBC: 3.07 Mil/uL — AB (ref 3.87–5.11)
RDW: 13.2 % (ref 11.5–15.5)
WBC: 20.1 10*3/uL (ref 4.0–10.5)

## 2017-06-14 LAB — POCT INFLUENZA A/B
INFLUENZA A, POC: NEGATIVE
INFLUENZA B, POC: NEGATIVE

## 2017-06-14 LAB — COMPREHENSIVE METABOLIC PANEL
ALBUMIN: 3.4 g/dL — AB (ref 3.5–5.2)
ALT: 74 U/L — AB (ref 0–35)
AST: 37 U/L (ref 0–37)
Alkaline Phosphatase: 207 U/L — ABNORMAL HIGH (ref 39–117)
BUN: 15 mg/dL (ref 6–23)
CALCIUM: 9 mg/dL (ref 8.4–10.5)
CO2: 27 mEq/L (ref 19–32)
CREATININE: 0.83 mg/dL (ref 0.40–1.20)
Chloride: 100 mEq/L (ref 96–112)
GFR: 72.59 mL/min (ref >60.00)
Glucose, Bld: 98 mg/dL (ref 70–99)
Potassium: 3.8 mEq/L (ref 3.5–5.1)
Sodium: 134 mEq/L — ABNORMAL LOW (ref 135–145)
TOTAL PROTEIN: 6.5 g/dL (ref 6.0–8.3)
Total Bilirubin: 0.7 mg/dL (ref 0.2–1.2)

## 2017-06-14 MED ORDER — PROMETHAZINE-DM 6.25-15 MG/5ML PO SYRP: 5.0000 mL | ORAL_SOLUTION | Freq: Four times a day (QID) | ORAL | 0 refills | Status: DC | PRN

## 2017-06-14 MED ORDER — LEVOFLOXACIN 500 MG PO TABS: 500.0000 mg | ORAL_TABLET | Freq: Every day | ORAL | 0 refills | Status: DC

## 2017-06-14 NOTE — Patient Instructions (Signed)

## 2017-06-14 NOTE — Telephone Encounter (Signed)
Pt started on abx today-- please have her repeat tomorrow cbcd stat  in am preferably

## 2017-06-14 NOTE — Telephone Encounter (Signed)
"  CRITICAL VALUE STICKER  CRITICAL VALUE:White count 20.1  RECEIVER (on-site recipient of call):Sabre Romberger P.  DATE & TIME NOTIFIED: 1700 06-14-17  MESSENGER (representative from lab):Hope  MD NOTIFIED:Lowne   TIME OF NOTIFICATION:1702  RESPONSE:

## 2017-06-14 NOTE — Progress Notes (Signed)
Patient ID: Donna Tapia, female    DOB: Oct 21, 1951  Age: 66 y.o. MRN: [id number]    Subjective:  Subjective  HPI Donna Tapia presents for cough, congestion and fever 103 since last Thursday.  She has been taking mucinex and tylenol with little relief.  No NVD + sinus ha   Review of Systems  Constitutional: Positive for chills and fever.  HENT: Positive for congestion, postnasal drip, rhinorrhea and sinus pressure.   Respiratory: Positive for cough, chest tightness, shortness of breath and wheezing.   Cardiovascular: Negative for chest pain, palpitations and leg swelling.  Allergic/Immunologic: Negative for environmental allergies.    History Past Medical History:  Diagnosis Date  . Anemia   . Chicken pox   . Environmental allergies   . Hepatitis B 1978  . Hyperlipidemia     She has a past surgical history that includes Abdominal hysterectomy; Evisceration (01/07/2012); TEE without cardioversion (01/10/2012); Lumbar laminectomy/decompression microdiscectomy (01/04/2012); and Appendectomy.   Her family history includes Colon cancer in her father and mother; Coronary artery disease in her mother; Heart disease in her unknown relative; Hyperlipidemia in her paternal aunt and paternal uncle; Hypertension in her unknown relative; Parkinsonism in her mother. She was adopted.She reports that she has never smoked. She has never used smokeless tobacco. She reports that she does not drink alcohol or use drugs.  Current Outpatient Prescriptions on File Prior to Visit  Medication Sig Dispense Refill  . acetaminophen (TYLENOL) 325 MG tablet Take 325 mg by mouth every 4 (four) hours as needed. For pain     . artificial tears (LACRILUBE) OINT ophthalmic ointment Apply to eye 4 (four) times daily. 1 Tube 0  . calcium carbonate (OS-CAL - DOSED IN MG OF ELEMENTAL CALCIUM) 1250 (500 Ca) MG tablet Take 1 tablet by mouth daily with breakfast.    . Cholecalciferol (VITAMIN D3) 2000 units TABS Take 1  tablet by mouth daily.    . CO ENZYME Q-10 PO Take 1 capsule by mouth daily.    . folic acid (FOLVITE) 400 MCG tablet Take 400 mcg by mouth 3 (three) times a week.     . Multiple Vitamin (MULITIVITAMIN WITH MINERALS) TABS Take 1 tablet by mouth daily.    . Omega-3 Fatty Acids (FISH OIL) 1000 MG CAPS Take 1 capsule by mouth daily.    . sertraline (ZOLOFT) 50 MG tablet Take 1 tablet (50 mg total) by mouth daily. 90 tablet 3   No current facility-administered medications on file prior to visit.      Objective:  Objective  Physical Exam  Constitutional: She is oriented to person, place, and time. She appears well-developed and well-nourished.  HENT:  Head: Normocephalic and atraumatic.  Eyes: Conjunctivae and EOM are normal.  Neck: Normal range of motion. Neck supple. No JVD present. Carotid bruit is not present. No thyromegaly present.  Cardiovascular: Normal rate, regular rhythm and normal heart sounds.   No murmur heard. Pulmonary/Chest: Effort normal. No respiratory distress. She has decreased breath sounds. She has no wheezes. She has no rales. She exhibits no tenderness.  Musculoskeletal: She exhibits no edema.  Neurological: She is alert and oriented to person, place, and time.  Psychiatric: She has a normal mood and affect. Her behavior is normal. Judgment and thought content normal.  Nursing note and vitals reviewed.  BP 104/64 (BP Location: Left Arm, Patient Position: Sitting, Cuff Size: Normal)   Pulse 83   Temp 99.3 F (37.4 C) (Oral)   Ht 5'  8" (1.727 m)   Wt 132 lb 8 oz (60.1 kg)   SpO2 95%   BMI 20.15 kg/m  Wt Readings from Last 3 Encounters:  06/14/17 132 lb 8 oz (60.1 kg)  05/17/17 134 lb (60.8 kg)  05/11/16 136 lb 6.4 oz (61.9 kg)     Lab Results  Component Value Date   WBC 9.2 05/17/2017   HGB 13.1 05/17/2017   HCT 39.1 05/17/2017   PLT 293.0 05/17/2017   GLUCOSE 94 05/17/2017   CHOL 229 (H) 05/17/2017   TRIG 177.0 (H) 05/17/2017   HDL 49.90  05/17/2017   LDLDIRECT 177.0 05/11/2016   LDLCALC 143 (H) 05/17/2017   ALT 17 05/17/2017   AST 20 05/17/2017   NA 142 05/17/2017   K 4.8 05/17/2017   CL 106 05/17/2017   CREATININE 0.79 05/17/2017   BUN 16 05/17/2017   CO2 26 05/17/2017   TSH 3.84 05/17/2017   INR 1.19 01/06/2012    Mm Screening Breast Tomo Bilateral  Result Date: 07/28/2016 CLINICAL DATA:  Screening. EXAM: 2D DIGITAL SCREENING BILATERAL MAMMOGRAM WITH CAD AND ADJUNCT TOMO COMPARISON:  Previous exam(s). ACR Breast Density Category b: There are scattered areas of fibroglandular density. FINDINGS: There are no findings suspicious for malignancy. Images were processed with CAD. IMPRESSION: No mammographic evidence of malignancy. A result letter of this screening mammogram will be mailed directly to the patient. RECOMMENDATION: Screening mammogram in one year. (Code:SM-B-01Y) BI-RADS CATEGORY  1: Negative. Electronically Signed   By: Frederico HammanMichelle  Collins M.D.   On: 07/30/2016 12:54     Assessment & Plan:  Plan  I am having Donna Tapia start on levofloxacin and promethazine-dextromethorphan. I am also having her maintain her multivitamin with minerals, CO ENZYME Q-10 PO, acetaminophen, artificial tears, Fish Oil, folic acid, calcium carbonate, Vitamin D3, and sertraline.  Meds ordered this encounter  Medications  . levofloxacin (LEVAQUIN) 500 MG tablet    Sig: Take 1 tablet (500 mg total) by mouth daily.    Dispense:  7 tablet    Refill:  0  . promethazine-dextromethorphan (PROMETHAZINE-DM) 6.25-15 MG/5ML syrup    Sig: Take 5 mLs by mouth 4 (four) times daily as needed.    Dispense:  118 mL    Refill:  0    Problem List Items Addressed This Visit    None    Visit Diagnoses    Cough    -  Primary   Relevant Medications   levofloxacin (LEVAQUIN) 500 MG tablet   promethazine-dextromethorphan (PROMETHAZINE-DM) 6.25-15 MG/5ML syrup   Other Relevant Orders   DG Chest 2 View (Completed)   POCT Influenza A/B (Completed)     Pneumonia due to infectious organism, unspecified laterality, unspecified part of lung       Relevant Medications   levofloxacin (LEVAQUIN) 500 MG tablet   promethazine-dextromethorphan (PROMETHAZINE-DM) 6.25-15 MG/5ML syrup   Other Relevant Orders   CBC with Differential/Platelet   Comprehensive metabolic panel   POCT Influenza A/B (Completed)   Fever and chills       Relevant Medications   levofloxacin (LEVAQUIN) 500 MG tablet   Other Relevant Orders   CBC with Differential/Platelet   Comprehensive metabolic panel   POCT Influenza A/B (Completed)      Follow-up: Return if symptoms worsen or fail to improve.  Donato SchultzYvonne R Lowne Chase, DO

## 2017-06-14 NOTE — Telephone Encounter (Signed)
Patient notified and put her on the lab schedule/put in order for STAT lab. She verbalized understanding.

## 2017-06-14 NOTE — Progress Notes (Signed)
Pre visit review using our clinic review tool, if applicable. No additional management support is needed unless otherwise documented below in the visit note. 

## 2017-06-15 ENCOUNTER — Other Ambulatory Visit (INDEPENDENT_AMBULATORY_CARE_PROVIDER_SITE_OTHER): Payer: 59

## 2017-06-15 DIAGNOSIS — D72829 Elevated white blood cell count, unspecified: Secondary | ICD-10-CM

## 2017-06-15 LAB — CBC WITH DIFFERENTIAL/PLATELET
Basophils Absolute: 0 10*3/uL (ref 0.0–0.1)
Basophils Relative: 0.3 % (ref 0.0–3.0)
EOS ABS: 0 10*3/uL (ref 0.0–0.7)
Eosinophils Relative: 0.2 % (ref 0.0–5.0)
HCT: 29.2 % — ABNORMAL LOW (ref 36.0–46.0)
Hemoglobin: 9.8 g/dL — ABNORMAL LOW (ref 12.0–15.0)
LYMPHS ABS: 0.8 10*3/uL (ref 0.7–4.0)
Lymphocytes Relative: 4.8 % — ABNORMAL LOW (ref 12.0–46.0)
MCHC: 33.5 g/dL (ref 30.0–36.0)
MCV: 96.3 fl (ref 78.0–100.0)
Monocytes Absolute: 0.9 10*3/uL (ref 0.1–1.0)
Monocytes Relative: 5.2 % (ref 3.0–12.0)
NEUTROS ABS: 15.7 10*3/uL — AB (ref 1.4–7.7)
Neutrophils Relative %: 89.5 % — ABNORMAL HIGH (ref 43.0–77.0)
PLATELETS: 428 10*3/uL — AB (ref 150.0–400.0)
RBC: 3.04 Mil/uL — ABNORMAL LOW (ref 3.87–5.11)
RDW: 13.3 % (ref 11.5–15.5)
WBC: 17.5 10*3/uL — ABNORMAL HIGH (ref 4.0–10.5)

## 2017-06-16 ENCOUNTER — Other Ambulatory Visit: Payer: Self-pay | Admitting: Family Medicine

## 2017-06-16 DIAGNOSIS — K625 Hemorrhage of anus and rectum: Secondary | ICD-10-CM

## 2017-06-16 DIAGNOSIS — J189 Pneumonia, unspecified organism: Secondary | ICD-10-CM

## 2017-06-16 DIAGNOSIS — D649 Anemia, unspecified: Secondary | ICD-10-CM

## 2017-06-16 NOTE — Progress Notes (Signed)
Possible but it is low enough that I want to be sure

## 2017-06-21 ENCOUNTER — Telehealth: Payer: Self-pay | Admitting: Family Medicine

## 2017-06-21 ENCOUNTER — Other Ambulatory Visit (INDEPENDENT_AMBULATORY_CARE_PROVIDER_SITE_OTHER): Payer: 59

## 2017-06-21 DIAGNOSIS — D649 Anemia, unspecified: Secondary | ICD-10-CM

## 2017-06-21 DIAGNOSIS — J189 Pneumonia, unspecified organism: Secondary | ICD-10-CM

## 2017-06-21 LAB — CBC WITH DIFFERENTIAL/PLATELET
BASOS ABS: 0 10*3/uL (ref 0.0–0.1)
Basophils Relative: 0.5 % (ref 0.0–3.0)
EOS ABS: 0.2 10*3/uL (ref 0.0–0.7)
Eosinophils Relative: 2 % (ref 0.0–5.0)
HCT: 30.4 % — ABNORMAL LOW (ref 36.0–46.0)
Hemoglobin: 10.2 g/dL — ABNORMAL LOW (ref 12.0–15.0)
LYMPHS ABS: 1.9 10*3/uL (ref 0.7–4.0)
LYMPHS PCT: 21.1 % (ref 12.0–46.0)
MCHC: 33.5 g/dL (ref 30.0–36.0)
MCV: 96.6 fl (ref 78.0–100.0)
Monocytes Absolute: 0.5 10*3/uL (ref 0.1–1.0)
Monocytes Relative: 5.9 % (ref 3.0–12.0)
NEUTROS ABS: 6.2 10*3/uL (ref 1.4–7.7)
NEUTROS PCT: 70.5 % (ref 43.0–77.0)
Platelets: 675 10*3/uL — ABNORMAL HIGH (ref 150.0–400.0)
RBC: 3.15 Mil/uL — AB (ref 3.87–5.11)
RDW: 13.2 % (ref 11.5–15.5)
WBC: 8.8 10*3/uL (ref 4.0–10.5)

## 2017-06-21 LAB — IBC PANEL
Iron: 48 ug/dL (ref 42–145)
SATURATION RATIOS: 17.2 % — AB (ref 20.0–50.0)
Transferrin: 199 mg/dL — ABNORMAL LOW (ref 212.0–360.0)

## 2017-06-21 LAB — FERRITIN: FERRITIN: 369.9 ng/mL — AB (ref 10.0–291.0)

## 2017-06-21 NOTE — Telephone Encounter (Signed)
Caller name: Relation to WS:FKCL Call back number:[phone] Pharmacy:  Reason for call: pt wanted to make Dr. Laury Axon aware that she is better, not 100%, still very fatigue and temp is running a little sub 95.5, pt also stated she did her labs today as well, pt thinks her hemoglobin may be low

## 2017-06-23 ENCOUNTER — Telehealth: Payer: Self-pay | Admitting: Family Medicine

## 2017-06-23 ENCOUNTER — Other Ambulatory Visit: Payer: Self-pay | Admitting: Family Medicine

## 2017-06-23 MED ORDER — CLARITHROMYCIN ER 500 MG PO TB24: 1000.0000 mg | ORAL_TABLET | Freq: Two times a day (BID) | ORAL | 0 refills | Status: DC

## 2017-06-23 NOTE — Telephone Encounter (Signed)
Should have been 2 a day not 2 bid

## 2017-06-23 NOTE — Telephone Encounter (Signed)
noted 

## 2017-06-23 NOTE — Telephone Encounter (Signed)
Pt called in to follow up on medication. I advised that a note has been sent to provider, PCP has left for the day. Made pt aware   She would like a call back when has been taken care of if possible, please.

## 2017-06-23 NOTE — Telephone Encounter (Signed)
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 Take for 7 days. The pharmacy received only 14 tablets, they wanted to verify if pt is needing rx for 7 days that the amount would be 28 tablets. Pt's provider is Dr Laury Axon but provider gone for today (sending it to Doc of the Day). Please advise ASAP.

## 2017-06-23 NOTE — Telephone Encounter (Signed)
Pt states that's Pharmacy  told her medication would be hard on her stomach pt wants to know if she could get something that would not be hard on her stomach

## 2017-06-24 NOTE — Telephone Encounter (Signed)
Doxycycline 100mg bid x 7 days.  

## 2017-06-24 NOTE — Telephone Encounter (Signed)
Pt called wanting to know if she is getting something different  for rx so she can get better. Also stated that pharmacy does not have rx in stock. Please advise.

## 2017-06-27 MED ORDER — DOXYCYCLINE HYCLATE 100 MG PO TABS: 100.0000 mg | ORAL_TABLET | Freq: Two times a day (BID) | ORAL | 0 refills | Status: DC

## 2017-06-27 NOTE — Telephone Encounter (Signed)
Rx sent to pharmacy on 06/27/17. LB

## 2017-06-29 ENCOUNTER — Other Ambulatory Visit (HOSPITAL_BASED_OUTPATIENT_CLINIC_OR_DEPARTMENT_OTHER): Payer: 59

## 2017-07-01 ENCOUNTER — Telehealth: Payer: Self-pay | Admitting: Family Medicine

## 2017-07-01 NOTE — Telephone Encounter (Signed)
°  Relation to ZO:XWRUpt:self Call back number:[phone] Pharmacy:  Reason for call:  Patient stats her labs were abnormal and would like to repeat labs on 07/13/17 please advise

## 2017-07-05 ENCOUNTER — Other Ambulatory Visit: Payer: Self-pay | Admitting: Family Medicine

## 2017-07-05 DIAGNOSIS — D649 Anemia, unspecified: Secondary | ICD-10-CM

## 2017-07-05 NOTE — Telephone Encounter (Signed)
Patient is coming in tomorrow 07/06/17 to repeat chest xray form her recent pneumonia. She would like to repeat the labs that we done cbc diff, ferritin and ibc to make sure numbers all normal now.

## 2017-07-05 NOTE — Telephone Encounter (Addendum)
Patient calling back checking on the status of message below,patient states she's coming in for her Bone Density and would like to have repeat labs conducted at this time, please advise

## 2017-07-05 NOTE — Telephone Encounter (Signed)
Yes-- I requested that be done on her last labs

## 2017-07-05 NOTE — Telephone Encounter (Signed)
See last labs 

## 2017-07-05 NOTE — Telephone Encounter (Signed)
Put in the order/scheduled her lab appointment to have done. Notified the patient of all information.

## 2017-07-06 ENCOUNTER — Other Ambulatory Visit (INDEPENDENT_AMBULATORY_CARE_PROVIDER_SITE_OTHER): Payer: Medicare Other

## 2017-07-06 ENCOUNTER — Ambulatory Visit (HOSPITAL_BASED_OUTPATIENT_CLINIC_OR_DEPARTMENT_OTHER)
Admission: RE | Admit: 2017-07-06 | Discharge: 2017-07-06 | Disposition: A | Discharge status: Home / Self Care | Payer: Medicare Other | Source: Ambulatory Visit | Attending: Family Medicine | Admitting: Family Medicine

## 2017-07-06 DIAGNOSIS — J189 Pneumonia, unspecified organism: Secondary | ICD-10-CM | POA: Diagnosis present

## 2017-07-06 DIAGNOSIS — R918 Other nonspecific abnormal finding of lung field: Secondary | ICD-10-CM | POA: Insufficient documentation

## 2017-07-06 DIAGNOSIS — D649 Anemia, unspecified: Secondary | ICD-10-CM

## 2017-07-06 LAB — CBC WITH DIFFERENTIAL/PLATELET
BASOS ABS: 0 10*3/uL (ref 0.0–0.1)
Basophils Relative: 0.5 % (ref 0.0–3.0)
EOS ABS: 0.4 10*3/uL (ref 0.0–0.7)
Eosinophils Relative: 5.9 % — ABNORMAL HIGH (ref 0.0–5.0)
HEMATOCRIT: 36 % (ref 36.0–46.0)
Hemoglobin: 11.8 g/dL — ABNORMAL LOW (ref 12.0–15.0)
LYMPHS PCT: 25.9 % (ref 12.0–46.0)
Lymphs Abs: 1.8 10*3/uL (ref 0.7–4.0)
MCHC: 32.9 g/dL (ref 30.0–36.0)
MCV: 97.5 fl (ref 78.0–100.0)
MONOS PCT: 8.3 % (ref 3.0–12.0)
Monocytes Absolute: 0.6 10*3/uL (ref 0.1–1.0)
NEUTROS ABS: 4.2 10*3/uL (ref 1.4–7.7)
NEUTROS PCT: 59.4 % (ref 43.0–77.0)
PLATELETS: 290 10*3/uL (ref 150.0–400.0)
RBC: 3.69 Mil/uL — AB (ref 3.87–5.11)
RDW: 14.7 % (ref 11.5–15.5)
WBC: 7.1 10*3/uL (ref 4.0–10.5)

## 2017-07-06 LAB — IBC PANEL
Iron: 56 ug/dL (ref 42–145)
SATURATION RATIOS: 15.4 % — AB (ref 20.0–50.0)
TRANSFERRIN: 259 mg/dL (ref 212.0–360.0)

## 2017-07-06 LAB — FERRITIN: FERRITIN: 189.8 ng/mL (ref 10.0–291.0)

## 2017-07-07 ENCOUNTER — Other Ambulatory Visit: Payer: Self-pay | Admitting: Family Medicine

## 2017-07-07 MED ORDER — LEVOFLOXACIN 500 MG PO TABS: 500.0000 mg | ORAL_TABLET | Freq: Every day | ORAL | 0 refills | Status: DC

## 2017-07-08 ENCOUNTER — Encounter: Payer: Self-pay | Admitting: Family Medicine

## 2017-07-08 ENCOUNTER — Ambulatory Visit (INDEPENDENT_AMBULATORY_CARE_PROVIDER_SITE_OTHER): Payer: Medicare Other | Admitting: Family Medicine

## 2017-07-08 ENCOUNTER — Ambulatory Visit (HOSPITAL_BASED_OUTPATIENT_CLINIC_OR_DEPARTMENT_OTHER)
Admission: RE | Admit: 2017-07-08 | Discharge: 2017-07-08 | Disposition: A | Discharge status: Home / Self Care | Payer: Medicare Other | Source: Ambulatory Visit

## 2017-07-08 ENCOUNTER — Ambulatory Visit (HOSPITAL_BASED_OUTPATIENT_CLINIC_OR_DEPARTMENT_OTHER)
Admission: RE | Admit: 2017-07-08 | Discharge: 2017-07-08 | Disposition: A | Discharge status: Home / Self Care | Payer: Medicare Other | Source: Ambulatory Visit | Attending: Family Medicine | Admitting: Family Medicine

## 2017-07-08 VITALS — BP 108/62 | HR 76 | Temp 97.9°F | Ht 68.0 in | Wt 129.4 lb

## 2017-07-08 DIAGNOSIS — R938 Abnormal findings on diagnostic imaging of other specified body structures: Secondary | ICD-10-CM | POA: Diagnosis not present

## 2017-07-08 DIAGNOSIS — J181 Lobar pneumonia, unspecified organism: Secondary | ICD-10-CM

## 2017-07-08 DIAGNOSIS — M810 Age-related osteoporosis without current pathological fracture: Secondary | ICD-10-CM

## 2017-07-08 DIAGNOSIS — I700 Atherosclerosis of aorta: Secondary | ICD-10-CM | POA: Diagnosis not present

## 2017-07-08 DIAGNOSIS — R9389 Abnormal findings on diagnostic imaging of other specified body structures: Secondary | ICD-10-CM

## 2017-07-08 DIAGNOSIS — J479 Bronchiectasis, uncomplicated: Secondary | ICD-10-CM | POA: Insufficient documentation

## 2017-07-08 DIAGNOSIS — I2510 Atherosclerotic heart disease of native coronary artery without angina pectoris: Secondary | ICD-10-CM | POA: Insufficient documentation

## 2017-07-08 MED ORDER — IOPAMIDOL (ISOVUE-300) INJECTION 61%
100.0000 mL | Freq: Once | INTRAVENOUS | Status: AC | PRN
  Administered 2017-07-08: 100 mL via INTRAVENOUS

## 2017-07-08 NOTE — Patient Instructions (Signed)

## 2017-07-08 NOTE — Assessment & Plan Note (Signed)
con't levaquin Check ct chest per radiology recommendation

## 2017-07-08 NOTE — Progress Notes (Signed)
Patient ID: Donna Desanctisina E Cicio, female    DOB: [birth year]  Age: 66 y.o. MRN: [id number]    Subjective:  Subjective  HPI Donna Tapia presents for f/u pneumonia-- she is feeling better.  She still has a cough and is fatigued.    Review of Systems  Constitutional: Negative for activity change, appetite change, fatigue and unexpected weight change.  Respiratory: Negative for cough and shortness of breath.   Cardiovascular: Negative for chest pain and palpitations.  Psychiatric/Behavioral: Negative for behavioral problems and dysphoric mood. The patient is not nervous/anxious.     History Past Medical History:  Diagnosis Date  . Anemia   . Chicken pox   . Environmental allergies   . Hepatitis B 1978  . Hyperlipidemia     She has a past surgical history that includes Abdominal hysterectomy; Evisceration (01/07/2012); TEE without cardioversion (01/10/2012); Lumbar laminectomy/decompression microdiscectomy (01/04/2012); and Appendectomy.   Her family history includes Colon cancer in her father and mother; Coronary artery disease in her mother; Heart disease in her unknown relative; Hyperlipidemia in her paternal aunt and paternal uncle; Hypertension in her unknown relative; Parkinsonism in her mother. She was adopted.She reports that she has never smoked. She has never used smokeless tobacco. She reports that she does not drink alcohol or use drugs.  Current Outpatient Prescriptions on File Prior to Visit  Medication Sig Dispense Refill  . acetaminophen (TYLENOL) 325 MG tablet Take 325 mg by mouth every 4 (four) hours as needed. For pain     . artificial tears (LACRILUBE) OINT ophthalmic ointment Apply to eye 4 (four) times daily. 1 Tube 0  . calcium carbonate (OS-CAL - DOSED IN MG OF ELEMENTAL CALCIUM) 1250 (500 Ca) MG tablet Take 1 tablet by mouth daily with breakfast.    . Cholecalciferol (VITAMIN D3) 2000 units TABS Take 1 tablet by mouth daily.    . CO ENZYME Q-10 PO Take 1 capsule by mouth  daily.    . folic acid (FOLVITE) 400 MCG tablet Take 400 mcg by mouth 3 (three) times a week.     Marland Kitchen. levofloxacin (LEVAQUIN) 500 MG tablet Take 1 tablet (500 mg total) by mouth daily. Take for 7 days 7 tablet 0  . Multiple Vitamin (MULITIVITAMIN WITH MINERALS) TABS Take 1 tablet by mouth daily.    . Omega-3 Fatty Acids (FISH OIL) 1000 MG CAPS Take 1 capsule by mouth daily.    . promethazine-dextromethorphan (PROMETHAZINE-DM) 6.25-15 MG/5ML syrup Take 5 mLs by mouth 4 (four) times daily as needed. 118 mL 0  . sertraline (ZOLOFT) 50 MG tablet Take 1 tablet (50 mg total) by mouth daily. 90 tablet 3   No current facility-administered medications on file prior to visit.      Objective:  Objective  Physical Exam  Constitutional: She is oriented to person, place, and time. She appears well-developed and well-nourished.  HENT:  Head: Normocephalic and atraumatic.  Eyes: Conjunctivae and EOM are normal.  Neck: Normal range of motion. Neck supple. No JVD present. Carotid bruit is not present. No thyromegaly present.  Cardiovascular: Normal rate, regular rhythm and normal heart sounds.   No murmur heard. Pulmonary/Chest: Effort normal and breath sounds normal. No respiratory distress. She has no wheezes. She has no rales. She exhibits no tenderness.  Musculoskeletal: She exhibits no edema.  Neurological: She is alert and oriented to person, place, and time.  Psychiatric: She has a normal mood and affect.   BP 108/62 (BP Location: Left Arm, Patient Position: Sitting, Cuff  Size: Normal)   Pulse 76   Temp 97.9 F (36.6 C) (Oral)   Ht  (1.727 m)   Wt 129 lb 6.4 oz (58.7 kg)   SpO2 97%   BMI 19.68 kg/m  Wt Readings from Last 3 Encounters:  07/08/17 129 lb 6.4 oz (58.7 kg)  06/14/17 132 lb 8 oz (60.1 kg)  05/17/17 134 lb (60.8 kg)     Lab Results  Component Value Date   WBC 7.1 07/06/2017   HGB 11.8 (L) 07/06/2017   HCT 36.0 07/06/2017   PLT 290.0 07/06/2017   GLUCOSE 98 06/14/2017    CHOL 229 (H) 05/17/2017   TRIG 177.0 (H) 05/17/2017   HDL 49.90 05/17/2017   LDLDIRECT 177.0 05/11/2016   LDLCALC 143 (H) 05/17/2017   ALT 74 (H) 06/14/2017   AST 37 06/14/2017   NA 134 (L) 06/14/2017   K 3.8 06/14/2017   CL 100 06/14/2017   CREATININE 0.83 06/14/2017   BUN 15 06/14/2017   CO2 27 06/14/2017   TSH 3.84 05/17/2017   INR 1.19 01/06/2012    Dg Chest 2 View  Result Date: 07/06/2017 CLINICAL DATA:  Followup pneumonia, still has some cough and congestion EXAM: CHEST  2 VIEW COMPARISON:  06/14/2017 FINDINGS: Stable heart size and pulmonary vascularity Fullness at AP window again identified with mild superior retraction of the LEFT hilum and LEFT upper lobe volume loss. Biapical scarring. Persistent opacity in the LEFT upper lobe, less in the lingula and in the lower RIGHT chest, question pneumonia, not significantly. More focal area of nodular opacity is seen at the LEFT base 13 mm diameter, question atelectasis versus consolidation though requiring radiographic followup until resolution to exclude nodule. Additional small nodular density in the RIGHT upper lobe 7 mm diameter versus cyst. No pleural effusion or pneumothorax. No acute osseous findings. IMPRESSION: Persistent BILATERAL pulmonary opacities which may represent persistent pneumonia though underlying nodular foci are not excluded. As these have failed to significantly improve over a three-week period, recommend CT chest with contrast to further assess and exclude underlying abnormalities. Electronically Signed   By: Ulyses Southward M.D.   On: 07/06/2017 12:34     Assessment & Plan:  Plan  I have discontinued Ms. Govan's clarithromycin and doxycycline. I am also having her maintain her multivitamin with minerals, CO ENZYME Q-10 PO, acetaminophen, artificial tears, Fish Oil, folic acid, calcium carbonate, Vitamin D3, sertraline, promethazine-dextromethorphan, and levofloxacin.  No orders of the defined types were placed in  this encounter.   Problem List Items Addressed This Visit      Unprioritized   Lobar pneumonia (HCC)    con't levaquin Check ct chest per radiology recommendation        Other Visit Diagnoses    Abnormal chest x-ray    -  Primary   Relevant Orders   CT Chest W Contrast (Completed)      Follow-up: No Follow-up on file.  [name], DO

## 2017-07-11 ENCOUNTER — Telehealth: Payer: Self-pay | Admitting: Family Medicine

## 2017-07-11 ENCOUNTER — Ambulatory Visit (HOSPITAL_BASED_OUTPATIENT_CLINIC_OR_DEPARTMENT_OTHER): Payer: Medicare Other

## 2017-07-11 NOTE — Telephone Encounter (Signed)
Looks like someone spoke with the pt, will call her back to verify if she needed anything further   Notes recorded by Ewing, Robin B, CMA on 07/07/2017 at 2:17 PM EDT Patient notified of results ------  Notes recorded by Donato SchultzLowne Chase, Yvonne R, DO on 07/07/2017 at 2:07 PM EDT Much better

## 2017-07-11 NOTE — Telephone Encounter (Signed)
Pt returned missed call for lab results. Call pt [phone]

## 2017-07-12 NOTE — Telephone Encounter (Signed)
Left message on machine to call back to assure pt had no additional questions since someone has already spoken with her about her lab results

## 2017-07-12 NOTE — Telephone Encounter (Signed)
Spoke with pt and she had additional question and since Dr. Laury AxonLowne was in the office I was able to get her question answered. She wanted to know if she needed any additional levaquin called in since she had two days left. Per Dr. Laury AxonLowne she can just finish what she has and just left us know if if her symptoms get worse. She had no further questions. Nothing further is needed    Notes recorded by Zola ButtonLowne Chase, Grayling CongressYvonne R, DO on 07/08/2017 at 5:21 PM EDT + pneumonia---- take levaquin  F/u 10-14 days

## 2017-07-13 ENCOUNTER — Other Ambulatory Visit (HOSPITAL_BASED_OUTPATIENT_CLINIC_OR_DEPARTMENT_OTHER): Payer: 59

## 2017-07-21 ENCOUNTER — Encounter: Payer: Self-pay | Admitting: Family Medicine

## 2017-07-21 ENCOUNTER — Ambulatory Visit (INDEPENDENT_AMBULATORY_CARE_PROVIDER_SITE_OTHER): Payer: Medicare Other | Admitting: Family Medicine

## 2017-07-21 VITALS — BP 114/56 | HR 64 | Temp 98.0°F | Ht 68.0 in | Wt 130.2 lb

## 2017-07-21 DIAGNOSIS — J189 Pneumonia, unspecified organism: Secondary | ICD-10-CM

## 2017-07-21 DIAGNOSIS — Z23 Encounter for immunization: Secondary | ICD-10-CM

## 2017-07-21 NOTE — Progress Notes (Signed)
Patient ID: Donna Tapia, female    DOB: 18-Dec-1950  Age: 66 y.o. MRN: [id number]    Subjective:  Subjective  HPI HESSIE VARONE presents for f/u pneumonia  She is still tired but is feeling better.    Review of Systems  Constitutional: Positive for fatigue. Negative for appetite change, diaphoresis and unexpected weight change.  Eyes: Negative for pain, redness and visual disturbance.  Respiratory: Negative for cough, chest tightness, shortness of breath and wheezing.   Cardiovascular: Negative for chest pain, palpitations and leg swelling.  Endocrine: Negative for cold intolerance, heat intolerance, polydipsia, polyphagia and polyuria.  Genitourinary: Negative for difficulty urinating, dysuria and frequency.  Neurological: Negative for dizziness, light-headedness, numbness and headaches.    History Past Medical History:  Diagnosis Date  . Anemia   . Chicken pox   . Environmental allergies   . Hepatitis B 1978  . Hyperlipidemia     She has a past surgical history that includes Abdominal hysterectomy; Evisceration (01/07/2012); TEE without cardioversion (01/10/2012); Lumbar laminectomy/decompression microdiscectomy (01/04/2012); and Appendectomy.   Her family history includes Colon cancer in her father and mother; Coronary artery disease in her mother; Heart disease in her unknown relative; Hyperlipidemia in her paternal aunt and paternal uncle; Hypertension in her unknown relative; Parkinsonism in her mother. She was adopted.She reports that she has never smoked. She has never used smokeless tobacco. She reports that she does not drink alcohol or use drugs.  Current Outpatient Prescriptions on File Prior to Visit  Medication Sig Dispense Refill  . acetaminophen (TYLENOL) 325 MG tablet Take 325 mg by mouth every 4 (four) hours as needed. For pain     . artificial tears (LACRILUBE) OINT ophthalmic ointment Apply to eye 4 (four) times daily. 1 Tube 0  . calcium carbonate (OS-CAL - DOSED  IN MG OF ELEMENTAL CALCIUM) 1250 (500 Ca) MG tablet Take 1 tablet by mouth daily with breakfast.    . Cholecalciferol (VITAMIN D3) 2000 units TABS Take 1 tablet by mouth daily.    . CO ENZYME Q-10 PO Take 1 capsule by mouth daily.    . folic acid (FOLVITE) 400 MCG tablet Take 400 mcg by mouth 3 (three) times a week.     . Multiple Vitamin (MULITIVITAMIN WITH MINERALS) TABS Take 1 tablet by mouth daily.    . Omega-3 Fatty Acids (FISH OIL) 1000 MG CAPS Take 1 capsule by mouth daily.    . sertraline (ZOLOFT) 50 MG tablet Take 1 tablet (50 mg total) by mouth daily. 90 tablet 3  . promethazine-dextromethorphan (PROMETHAZINE-DM) 6.25-15 MG/5ML syrup Take 5 mLs by mouth 4 (four) times daily as needed. (Patient not taking: Reported on 07/21/2017) 118 mL 0   No current facility-administered medications on file prior to visit.      Objective:  Objective  Physical Exam  Constitutional: She is oriented to person, place, and time. She appears well-developed and well-nourished.  HENT:  Head: Normocephalic and atraumatic.  Eyes: Conjunctivae and EOM are normal.  Neck: Normal range of motion. Neck supple. No JVD present. Carotid bruit is not present. No thyromegaly present.  Cardiovascular: Normal rate, regular rhythm and normal heart sounds.   No murmur heard. Pulmonary/Chest: Effort normal and breath sounds normal. No respiratory distress. She has no wheezes. She has no rales. She exhibits no tenderness.  Musculoskeletal: She exhibits no edema.  Neurological: She is alert and oriented to person, place, and time.  Psychiatric: She has a normal mood and affect.  Nursing note  and vitals reviewed.  BP (!) 114/56 (BP Location: Right Arm, Patient Position: Sitting, Cuff Size: Normal)   Pulse 64   Temp 98 F (36.7 C) (Oral)   Ht  (1.727 m)   Wt 130 lb 3.2 oz (59.1 kg)   SpO2 97%   BMI 19.80 kg/m  Wt Readings from Last 3 Encounters:  07/21/17 130 lb 3.2 oz (59.1 kg)  07/08/17 129 lb 6.4 oz (58.7  kg)  06/14/17 132 lb 8 oz (60.1 kg)     Lab Results  Component Value Date   WBC 7.1 07/06/2017   HGB 11.8 (L) 07/06/2017   HCT 36.0 07/06/2017   PLT 290.0 07/06/2017   GLUCOSE 98 06/14/2017   CHOL 229 (H) 05/17/2017   TRIG 177.0 (H) 05/17/2017   HDL 49.90 05/17/2017   LDLDIRECT 177.0 05/11/2016   LDLCALC 143 (H) 05/17/2017   ALT 74 (H) 06/14/2017   AST 37 06/14/2017   NA 134 (L) 06/14/2017   K 3.8 06/14/2017   CL 100 06/14/2017   CREATININE 0.83 06/14/2017   BUN 15 06/14/2017   CO2 27 06/14/2017   TSH 3.84 05/17/2017   INR 1.19 01/06/2012    Ct Chest W Contrast  Result Date: 07/08/2017 CLINICAL DATA:  66 year old female with history of pneumonia for the past 3 weeks. Cough and fever. On antibiotics for the past 2 weeks. EXAM: CT CHEST WITH CONTRAST TECHNIQUE: Multidetector CT imaging of the chest was performed during intravenous contrast administration. CONTRAST:  ISOVUE-300 IOPAMIDOL (ISOVUE-300) INJECTION 61% COMPARISON:  Chest CT 12/31/2011. FINDINGS: Cardiovascular: Heart size is normal. There is no significant pericardial fluid, thickening or pericardial calcification. There is aortic atherosclerosis, as well as atherosclerosis of the great vessels of the mediastinum and the coronary arteries, including calcified atherosclerotic plaque in the right coronary artery. Mediastinum/Nodes: Multiple borderline enlarged and mildly enlarged mediastinal and bilateral hilar lymph nodes are noted, largest of which is a prevascular lymph node measuring 13 mm in short axis. Esophagus is unremarkable in appearance. No axillary lymphadenopathy. Lungs/Pleura: Widespread but patchy areas of cylindrical and varicose bronchiectasis are noted throughout the lungs bilaterally, most severe in the left upper lobe where there is profound thickening of the peribronchovascular interstitium and a regional area of volume loss and architectural distortion which is most compatible with chronic post  infectious scarring secondary to pneumonia in this left upper lobe on prior CT 12/31/2011. Widespread areas of peribronchovascular micro and macronodularity as well as peribronchovascular ground-glass attenuation scattered throughout both lungs. No pleural effusions. Upper Abdomen: Aortic atherosclerosis. Musculoskeletal: There are no aggressive appearing lytic or blastic lesions noted in the visualized portions of the skeleton. IMPRESSION: 1. Widespread areas of bronchiectasis with extensive post infectious scarring in the left upper lobe and scattered areas of what appear to be areas of mucoid impaction an active inflammation/infection, as discussed above. The diffuse findings may suggest a chronic indolent atypical infections such is MAI (mycobacterium avium intracellulare). 2. Aortic atherosclerosis, in addition to right coronary artery disease. Please note that although the presence of coronary artery calcium documents the presence of coronary artery disease, the severity of this disease and any potential stenosis cannot be assessed on this non-gated CT examination. Assessment for potential risk factor modification, dietary therapy or pharmacologic therapy may be warranted, if clinically indicated. Aortic Atherosclerosis (ICD10-I70.0). Electronically Signed   By: Trudie Reed M.D.   On: 07/08/2017 16:11   Dg Bone Density  Result Date: 07/08/2017 EXAM: DUAL X-RAY ABSORPTIOMETRY (DXA) FOR BONE MINERAL  DENSITY IMPRESSION: Referring Physician:  Lelon Perla CHASE PATIENT: Name: Mendi, Constable Patient ID: [id number] Birth Date: [birth year] Height: 68.0 in. Sex: Female Measured: 07/08/2017 Weight: 128.0 lbs. Indications: Caucasian, Early Menopause, Estrogen Deficiency, Family Hist. (Parent hip fracture), Family Hx of Osteoporosis, History of Osteoporosis, Hysterectomy, Oophorectomy ( Bilateral), Post Menopausal Fractures: Treatments: Calcium, Vitamin D ASSESSMENT: The BMD measured at Forearm Radius 33% is 0.580  g/cm2 with a T-score of -3.4. This patient is considered osteoporotic according to World Health Organization [facility]) criteria. Lumbar spine was not utilized due to advanced degenerative changes. Site Region Measured Date Measured Age WHO YA BMD Classification T-score DualFemur Total Left 5/08/[age] 68.3 [age] Osteoporosis -3.0 0.626 g/cm2 Right Forearm Radius 33% 07/08/2017 68.3 Osteoporosis -3.4 0.580 g/cm2 World Health Organization [facility]) criteria for post-menopausal, Caucasian Women: Normal       T-score at or above -1 SD Osteopenia   T-score between -1 and -2.5 SD Osteoporosis T-score at or below -2.5 SD RECOMMENDATION: National Osteoporosis Foundation recommends that FDA-approved medical therapies be considered in postmenopausal women and men age 53 or older with a: 1. Hip or vertebral (clinical or morphometric) fracture. 2. T-score of < -2.5 at the spine or hip. 3. Ten-year fracture probability by FRAX of 3% or greater for hip fracture or 20% or greater for major osteoporotic fracture. All treatment decisions require clinical judgment and consideration of individual patient factors, including patient preferences, co-morbidities, previous drug use, risk factors not captured in the FRAX model (e.g. falls, vitamin D deficiency, increased bone turnover, interval significant decline in bone density) and possible under - or over-estimation of fracture risk by FRAX. All patients should ensure an adequate intake of dietary calcium (1200 mg/d) and vitamin D (800 IU daily) unless contraindicated. FOLLOW-UP: People with diagnosed cases of osteoporosis or at high risk for fracture should have regular bone mineral density tests. For patients eligible for Medicare, routine testing is allowed once every 2 years. The testing frequency can be increased to one year for patients who have rapidly progressing disease, those who are receiving or discontinuing medical therapy to restore bone mass, or have additional risk factors. I have  reviewed this report and agree with the above findings. [facility] Electronically Signed   By: Bretta Bang III M.D.   On: 07/08/2017 14:34     Assessment & Plan:  Plan  I have discontinued Ms. Helser's levofloxacin. I am also having her maintain her multivitamin with minerals, CO ENZYME Q-10 PO, acetaminophen, artificial tears, Fish Oil, folic acid, calcium carbonate, Vitamin D3, sertraline, and promethazine-dextromethorphan.  No orders of the defined types were placed in this encounter.   Problem List Items Addressed This Visit    None    Visit Diagnoses    Community acquired pneumonia of left lung, unspecified part of lung    -  Primary   Relevant Orders   DG Chest 2 View   Need for prophylactic vaccination and inoculation against influenza       Relevant Orders   Flu vaccine HIGH DOSE PF (Fluzone High dose) (Completed)    rto prn  Follow-up: Return if symptoms worsen or fail to improve.  [name], DO

## 2017-07-21 NOTE — Patient Instructions (Signed)

## 2017-07-29 ENCOUNTER — Ambulatory Visit: Payer: Medicaid Other

## 2017-07-29 ENCOUNTER — Ambulatory Visit (HOSPITAL_BASED_OUTPATIENT_CLINIC_OR_DEPARTMENT_OTHER)
Admission: RE | Admit: 2017-07-29 | Discharge: 2017-07-29 | Disposition: A | Discharge status: Home / Self Care | Payer: Medicare Other | Source: Ambulatory Visit | Attending: Family Medicine | Admitting: Family Medicine

## 2017-07-29 ENCOUNTER — Other Ambulatory Visit: Payer: Self-pay | Admitting: Family Medicine

## 2017-07-29 DIAGNOSIS — J189 Pneumonia, unspecified organism: Secondary | ICD-10-CM

## 2017-07-29 DIAGNOSIS — R918 Other nonspecific abnormal finding of lung field: Secondary | ICD-10-CM | POA: Diagnosis not present

## 2017-07-29 DIAGNOSIS — Z8701 Personal history of pneumonia (recurrent): Secondary | ICD-10-CM | POA: Diagnosis not present

## 2017-08-01 ENCOUNTER — Other Ambulatory Visit: Payer: Self-pay | Admitting: Family Medicine

## 2017-08-01 DIAGNOSIS — J189 Pneumonia, unspecified organism: Secondary | ICD-10-CM

## 2017-08-04 ENCOUNTER — Telehealth: Payer: Self-pay | Admitting: Family Medicine

## 2017-08-04 NOTE — Telephone Encounter (Signed)
Completed.

## 2017-08-12 ENCOUNTER — Telehealth: Payer: Self-pay | Admitting: Family Medicine

## 2017-08-12 NOTE — Telephone Encounter (Signed)
[employer] Benefits verified NO PA required $183 deductible has been met Coverage 100%  Patient may owe approximately $0 OOP

## 2017-08-16 NOTE — Telephone Encounter (Signed)
Donna Tapia returned your call  [phone]

## 2017-08-16 NOTE — Telephone Encounter (Signed)
Called left message to call back 

## 2017-08-16 NOTE — Telephone Encounter (Signed)
Patient informed of cost/she verbalized agreement to go ahead and try the shot. Scheduled her a nurse visit for 08/31/2017 at 10:45 am. Let Nicki Guadalajara know to order her a shot as well.

## 2017-08-19 ENCOUNTER — Encounter: Payer: Self-pay | Admitting: Emergency Medicine

## 2017-08-19 ENCOUNTER — Ambulatory Visit (INDEPENDENT_AMBULATORY_CARE_PROVIDER_SITE_OTHER): Payer: 59 | Admitting: Emergency Medicine

## 2017-08-19 DIAGNOSIS — R053 Chronic cough: Secondary | ICD-10-CM

## 2017-08-19 DIAGNOSIS — R05 Cough: Secondary | ICD-10-CM

## 2017-08-19 DIAGNOSIS — J181 Lobar pneumonia, unspecified organism: Secondary | ICD-10-CM

## 2017-08-19 DIAGNOSIS — J479 Bronchiectasis, uncomplicated: Secondary | ICD-10-CM

## 2017-08-19 NOTE — Assessment & Plan Note (Signed)
Appears to be clinically improved following antibiotics, Levaquin.

## 2017-08-19 NOTE — Assessment & Plan Note (Addendum)
Residual area of left upper lobe scarring with some associated bronchiectasis. She also has an area of bronchiectasis in the mid right lung. He is some mild but evident micronodular disease in the left upper lobe that could reflect resolving pneumonia versus opportunistic infection. We discussed the signs and symptoms to watch for that would be consistent with an acute exacerbation. Need to follow her imaging, if the micronodular disease persists or progresses then sputum or BAL cultures should be performed. I will perform a CT chest in 6 months. If she has progressive clinical symptoms and we will do the CT scan sooner. Try starting guaifenesin to see if this helps with mucus clearance

## 2017-08-19 NOTE — Assessment & Plan Note (Signed)
I believe that much of her chronic cough is upper airway in nature. She has some mild LPR, mild allergic rhinitis. At usual baseline these don't seem to bother her very much. If her cough increases I made some recommendations for omeprazole, antihistamines.

## 2017-08-19 NOTE — Progress Notes (Signed)
Subjective:    Patient ID: Donna Tapia, female    DOB: [birth year], 66 y.o.   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 During that hospitalization she had a left-sided multi lobar HCAP. She is referred now for an episode of cough, moderately productive, aches + low grade fever, that started in mid August. CXR at that time showed LUL infiltrate and she was treated with levaquin. She clinically improved, still had some chest congestion. Underwent a CT chest 07/08/17 that showed bronchiectasis with focality LUL. She was treated with another round abx. Feels even now than mid September. She has occasional LPR / GERD. No significant aspiration sx. She does have some rhinitis. She takes an OTC decongestant, benadryl.               Review of Systems  Constitutional: Negative for fever and unexpected weight change.  HENT: Negative for congestion, dental problem, ear pain, nosebleeds, postnasal drip, rhinorrhea, sinus pressure, sneezing, sore throat and trouble swallowing.   Eyes: Negative for redness and itching.  Respiratory: Positive for cough and chest tightness. Negative for shortness of breath and wheezing.   Cardiovascular: Negative for palpitations and leg swelling.  Gastrointestinal: Negative for nausea and vomiting.  Genitourinary: Negative for dysuria.  Musculoskeletal: Negative for joint swelling.  Skin: Negative for rash.  Neurological: Negative for headaches.  Hematological: Does not bruise/bleed easily.  Psychiatric/Behavioral: Negative for dysphoric mood. The patient is not nervous/anxious.     Past Medical History:  Diagnosis Date  . Anemia   . Chicken pox   . Environmental allergies   . Hepatitis B 1978  . Hyperlipidemia      Family History  Problem Relation Age of Onset  . Adopted: Yes    . Coronary artery disease Mother        Adopted mother  . Parkinsonism Mother        Adopted Mother  . Colon cancer Mother   . Colon cancer Father   . Hyperlipidemia Paternal Aunt   . Hyperlipidemia Paternal Uncle   . Heart disease Unknown        Both families  . Hypertension Unknown      Social History   Social History  . Marital status: Single    Spouse name: N/A  . Number of children: N/A  . Years of education: N/A   Occupational History  . retired    Social History Main Topics  . Smoking status: Never Smoker  . Smokeless tobacco: Never Used  . Alcohol use No  . Drug use: No  . Sexual activity: No   Other Topics Concern  . Not on file   Social History Narrative   Exercise- walks        Allergies  Allergen Reactions  . Sulfur Anaphylaxis and Rash     Outpatient Medications Prior to Visit  Medication Sig Dispense Refill  . acetaminophen (TYLENOL) 325 MG tablet Take 325 mg by mouth every 4 (four) hours as needed. For pain     . artificial tears (LACRILUBE) OINT ophthalmic ointment Apply to eye 4 (four) times daily. 1 Tube 0  . calcium carbonate (OS-CAL - DOSED IN MG OF ELEMENTAL CALCIUM) 1250 (500 Ca) MG tablet Take 1 tablet by mouth daily with breakfast.    . Cholecalciferol (VITAMIN D3) 2000 units TABS Take 1 tablet by mouth  daily.    . CO ENZYME Q-10 PO Take 1 capsule by mouth daily.    . folic acid (FOLVITE) 400 MCG tablet Take 400 mcg by mouth 3 (three) times a week.     . Multiple Vitamin (MULITIVITAMIN WITH MINERALS) TABS Take 1 tablet by mouth daily.    . Omega-3 Fatty Acids (FISH OIL) 1000 MG CAPS Take 1 capsule by mouth daily.    . sertraline (ZOLOFT) 50 MG tablet Take 1 tablet (50 mg total) by mouth daily. 90 tablet 3  . promethazine-dextromethorphan (PROMETHAZINE-DM) 6.25-15 MG/5ML syrup Take 5 mLs by mouth 4 (four) times daily as needed. (Patient not taking: Reported on 07/21/2017) 118 mL 0   No facility-administered medications prior to visit.          Objective:   Physical Exam Vitals:   08/19/17 1026  BP: 102/62  Pulse: 76  SpO2: 99%  Weight: 132 lb (59.9 kg)  Height: 5\' 8"  (1.727 m)   Gen: Pleasant, thin woman, in no distress,  normal affect  ENT: No lesions,  mouth clear,  oropharynx clear, no postnasal drip  Neck: No JVD, no stridor  Lungs: No use of accessory muscles, no wheezing, no crackles. Good air movement in the left upper lobe region  Cardiovascular: RRR, heart sounds normal, no murmur or gallops, no peripheral edema  Musculoskeletal: No deformities, no cyanosis or clubbing  Neuro: alert, non focal  Skin: Warm, no lesions or rashes   CT chest 07/08/17 --  COMPARISON:  Chest CT 12/31/2011.  FINDINGS: Cardiovascular: Heart size is normal. There is no significant pericardial fluid, thickening or pericardial calcification. There is aortic atherosclerosis, as well as atherosclerosis of the great vessels of the mediastinum and the coronary arteries, including calcified atherosclerotic plaque in the right coronary artery.  Mediastinum/Nodes: Multiple borderline enlarged and mildly enlarged mediastinal and bilateral hilar lymph nodes are noted, largest of which is a prevascular lymph node measuring 13 mm in short axis. Esophagus is unremarkable in appearance. No axillary lymphadenopathy.  Lungs/Pleura: Widespread but patchy areas of cylindrical and varicose bronchiectasis are noted throughout the lungs bilaterally, most severe in the left upper lobe where there is profound thickening of the peribronchovascular interstitium and a regional area of volume loss and architectural distortion which is most compatible with chronic post infectious scarring secondary to pneumonia in this left upper lobe on prior CT 12/31/2011. Widespread areas of peribronchovascular micro and macronodularity as well as peribronchovascular ground-glass attenuation scattered throughout both lungs. No pleural  effusions.  Upper Abdomen: Aortic atherosclerosis.  Musculoskeletal: There are no aggressive appearing lytic or blastic lesions noted in the visualized portions of the skeleton.  IMPRESSION: 1. Widespread areas of bronchiectasis with extensive post infectious scarring in the left upper lobe and scattered areas of what appear to be areas of mucoid impaction an active inflammation/infection, as discussed above. The diffuse findings may suggest a chronic indolent atypical infections such is MAI (mycobacterium avium intracellulare). 2. Aortic atherosclerosis, in addition to right coronary artery disease. Please note that although the presence of coronary artery calcium documents the presence of coronary artery disease, the severity of this disease and any potential stenosis cannot be assessed on this non-gated CT examination. Assessment for potential risk factor modification, dietary therapy or pharmacologic therapy may be warranted, if clinically indicated.       Assessment & Plan:  Lobar pneumonia (HCC) Appears to be clinically improved following antibiotics, Levaquin.  Bronchiectasis without complication (HCC) Residual area of left upper lobe scarring with  some associated bronchiectasis. She also has an area of bronchiectasis in the mid right lung. He is some mild but evident micronodular disease in the left upper lobe that could reflect resolving pneumonia versus opportunistic infection. We discussed the signs and symptoms to watch for that would be consistent with an acute exacerbation. Need to follow her imaging, if the micronodular disease persists or progresses then sputum or BAL cultures should be performed. I will perform a CT chest in 6 months. If she has progressive clinical symptoms and we will do the CT scan sooner. Try starting guaifenesin to see if this helps with mucus clearance  Chronic cough I believe that much of her chronic cough is upper airway in nature. She has  some mild LPR, mild allergic rhinitis. At usual baseline these don't seem to bother her very much. If her cough increases I made some recommendations for omeprazole, antihistamines.  Levy Pupa, MD, PhD 08/19/2017, 11:13 AM [facility] [phone] or if no answer [phone]

## 2017-08-19 NOTE — Telephone Encounter (Signed)
Patient nurse visit scheduled.

## 2017-08-19 NOTE — Telephone Encounter (Signed)
Prolia is here. 

## 2017-08-19 NOTE — Patient Instructions (Signed)
Try using mucinex 600 mg twice a day for one month to see if this helps up clear mucous from your chest You may want to consider trying omeprazole 20 mg once a day to see if this helps reflux and associated coughing. Remember to take this medication 1 hour prior to eating Continue your Benadryl. You may want to consider starting an everyday antihistamine such as loratadine 10 mg daily. This may help your dry cough. We will perform a CT scan of the chest in March 2019, no contrast, to follow bronchiectasis Follow with Dr. Delton CoombesByrum following the CT scan to review the results or sooner if you have any increasing cough or mucous production.

## 2017-08-31 ENCOUNTER — Ambulatory Visit (INDEPENDENT_AMBULATORY_CARE_PROVIDER_SITE_OTHER): Payer: Medicaid Other

## 2017-08-31 DIAGNOSIS — M810 Age-related osteoporosis without current pathological fracture: Secondary | ICD-10-CM

## 2017-08-31 MED ORDER — DENOSUMAB 60 MG/ML ~~LOC~~ SOLN
60.0000 mg | Freq: Once | SUBCUTANEOUS | Status: AC
  Administered 2017-08-31: 60 mg via SUBCUTANEOUS

## 2017-08-31 NOTE — Progress Notes (Addendum)
Pre visit review using our clinic tool,if applicable. No additional management support is needed unless otherwise documented below in the visit note.   Patient in for Prolia injection per order from Dr. Zola ButtonLowne-Chase dated 08/19/17.  Given 60 mg SQ left arm. Patient tolerated first injection well. Advised of and given 6 month reminder card.  Reviewed and ok with administration of prolia  Saguier, Ramon DredgeEdward, PA-C

## 2017-11-14 ENCOUNTER — Ambulatory Visit: Payer: Self-pay

## 2017-11-14 NOTE — Telephone Encounter (Signed)
Pt. called with c/o 3 day hx. Of fever, hacking cough, lack of energy, and intermittent dizziness.  Reported sinus drainage, but stated she usually has sinus drainage due to allergies.  Reported fever of 102 degrees this morning at 9:00 AM.  Reported her cough is "mostly dry and hacking", and has coughed up very small amts. of milky mucus.  Reported she has been drinking a lot of fluids; drinking decaffeinated beverages, juice and water. Per protocol, advised that she should be seen within 4 hours.  Stated she preferred to see her PCP, and no one else.  Her PCP didn't have avail. Appts. today.  stated she preferred to wait until tomorrow.  Appt. Given for 2:15 PM, 1/15.  Encouraged to go to UC this eve. If her symptoms worsen.  Care Advice given per protocol.  Verb. Understanding.        Reason for Disposition . [1] Fever > 101 F (38.3 C) AND [2] age > 6360  Answer Assessment - Initial Assessment Questions 1. ONSET: "When did the nasal discharge start?"      Sinus drainage  2. AMOUNT: "How much discharge is there?"      About same as typical allergy symptoms, and having clear with blood tinge  3. COUGH: "Do you have a cough?" If yes, ask: "Describe the color of your sputum" (clear, white, yellow, green)     A little mucus that looks milky 4. RESPIRATORY DISTRESS: "Describe your breathing."      Breathing is okay 5. FEVER: "Do you have a fever?" If so, ask: "What is your temperature, how was it measured, and when did it start?"     100-102 degrees 6. SEVERITY: "Overall, how bad are you feeling right now?" (e.g., doesn't interfere with normal activities, staying home from school/work, staying in bed)     I don't feel good, but I think I could wait until tomorrow for an appt.  7. OTHER SYMPTOMS: "Do you have any other symptoms?" (e.g., sore throat, earache, wheezing, vomiting)     Intermittent dizziness when 1st gets up; and lack of energy; cough is "hacking, choking type cough"; noticed urine more  concentrated, and has a little odor to it; denies any dysuria   8. PREGNANCY: "Is there any chance you are pregnant?" "When was your last menstrual period?"     no  Protocols used: COMMON COLD-A-AH

## 2017-11-15 ENCOUNTER — Encounter: Payer: Self-pay | Admitting: Family Medicine

## 2017-11-15 ENCOUNTER — Ambulatory Visit (HOSPITAL_BASED_OUTPATIENT_CLINIC_OR_DEPARTMENT_OTHER)
Admission: RE | Admit: 2017-11-15 | Discharge: 2017-11-15 | Disposition: A | Discharge status: Home / Self Care | Payer: 59 | Source: Ambulatory Visit | Attending: Family Medicine | Admitting: Family Medicine

## 2017-11-15 ENCOUNTER — Ambulatory Visit (INDEPENDENT_AMBULATORY_CARE_PROVIDER_SITE_OTHER): Payer: 59 | Admitting: Family Medicine

## 2017-11-15 VITALS — BP 99/52 | HR 95 | Temp 101.0°F | Ht 68.0 in | Wt 134.0 lb

## 2017-11-15 DIAGNOSIS — R5383 Other fatigue: Secondary | ICD-10-CM

## 2017-11-15 DIAGNOSIS — R918 Other nonspecific abnormal finding of lung field: Secondary | ICD-10-CM | POA: Insufficient documentation

## 2017-11-15 DIAGNOSIS — R05 Cough: Secondary | ICD-10-CM | POA: Insufficient documentation

## 2017-11-15 DIAGNOSIS — R232 Flushing: Secondary | ICD-10-CM

## 2017-11-15 DIAGNOSIS — R059 Cough, unspecified: Secondary | ICD-10-CM

## 2017-11-15 DIAGNOSIS — R509 Fever, unspecified: Secondary | ICD-10-CM

## 2017-11-15 DIAGNOSIS — Z8619 Personal history of other infectious and parasitic diseases: Secondary | ICD-10-CM

## 2017-11-15 DIAGNOSIS — J324 Chronic pansinusitis: Secondary | ICD-10-CM

## 2017-11-15 DIAGNOSIS — D509 Iron deficiency anemia, unspecified: Secondary | ICD-10-CM

## 2017-11-15 LAB — POCT INFLUENZA A/B
INFLUENZA B, POC: NEGATIVE
Influenza A, POC: NEGATIVE

## 2017-11-15 MED ORDER — AMOXICILLIN-POT CLAVULANATE 875-125 MG PO TABS: 1.0000 | ORAL_TABLET | Freq: Two times a day (BID) | ORAL | 0 refills | Status: DC

## 2017-11-15 MED ORDER — VALACYCLOVIR HCL 1 G PO TABS: 1000.0000 mg | ORAL_TABLET | Freq: Two times a day (BID) | ORAL | 2 refills | Status: DC

## 2017-11-15 MED FILL — valACYclovir HCL 1 GM TABS: 1 | 15 days supply | Qty: 30 | Fill #0

## 2017-11-15 MED FILL — AMOX-CLAV 875-125 MG TABLET: 875-125 | 10 days supply | Qty: 20 | Fill #0

## 2017-11-15 NOTE — Patient Instructions (Addendum)
Cold sores Valtrex 1 g  2 po at first sign of blister and repeat 12 h later  If you are late and cold sore does come take 1 tab 3x a day fodr 7 - 10 days             Sinusitis, Adult Sinusitis is soreness and inflammation of your sinuses. Sinuses are hollow spaces in the bones around your face. Your sinuses are located:  Around your eyes.  In the middle of your forehead.  Behind your nose.  In your cheekbones.  Your sinuses and nasal passages are lined with a stringy fluid (mucus). Mucus normally drains out of your sinuses. When your nasal tissues become inflamed or swollen, the mucus can become trapped or blocked so air cannot flow through your sinuses. This allows bacteria, viruses, and funguses to grow, which leads to infection. Sinusitis can develop quickly and last for 7?10 days (acute) or for more than 12 weeks (chronic). Sinusitis often develops after a cold. What are the causes? This condition is caused by anything that creates swelling in the sinuses or stops mucus from draining, including:  Allergies.  Asthma.  Bacterial or viral infection.  Abnormally shaped bones between the nasal passages.  Nasal growths that contain mucus (nasal polyps).  Narrow sinus openings.  Pollutants, such as chemicals or irritants in the air.  A foreign object stuck in the nose.  A fungal infection. This is rare.  What increases the risk? The following factors may make you more likely to develop this condition:  Having allergies or asthma.  Having had a recent cold or respiratory tract infection.  Having structural deformities or blockages in your nose or sinuses.  Having a weak immune system.  Doing a lot of swimming or diving.  Overusing nasal sprays.  Smoking.  What are the signs or symptoms? The main symptoms of this condition are pain and a feeling of pressure around the affected sinuses. Other symptoms include:  Upper  toothache.  Earache.  Headache.  Bad breath.  Decreased sense of smell and taste.  A cough that may get worse at night.  Fatigue.  Fever.  Thick drainage from your nose. The drainage is often green and it may contain pus (purulent).  Stuffy nose or congestion.  Postnasal drip. This is when extra mucus collects in the throat or back of the nose.  Swelling and warmth over the affected sinuses.  Sore throat.  Sensitivity to light.  How is this diagnosed? This condition is diagnosed based on symptoms, a medical history, and a physical exam. To find out if your condition is acute or chronic, your health care provider may:  Look in your nose for signs of nasal polyps.  Tap over the affected sinus to check for signs of infection.  View the inside of your sinuses using an imaging device that has a light attached (endoscope).  If your health care provider suspects that you have chronic sinusitis, you may also:  Be tested for allergies.  Have a sample of mucus taken from your nose (nasal culture) and checked for bacteria.  Have a mucus sample examined to see if your sinusitis is related to an allergy.  If your sinusitis does not respond to treatment and it lasts longer than 8 weeks, you may have an MRI or CT scan to check your sinuses. These scans also help to determine how severe your infection is. In rare cases, a bone biopsy may be done to rule out more serious  types of fungal sinus disease. How is this treated? Treatment for sinusitis depends on the cause and whether your condition is chronic or acute. If a virus is causing your sinusitis, your symptoms will go away on their own within 10 days. You may be given medicines to relieve your symptoms, including:  Topical nasal decongestants. They shrink swollen nasal passages and let mucus drain from your sinuses.  Antihistamines. These drugs block inflammation that is triggered by allergies. This can help to ease swelling in  your nose and sinuses.  Topical nasal corticosteroids. These are nasal sprays that ease inflammation and swelling in your nose and sinuses.  Nasal saline washes. These rinses can help to get rid of thick mucus in your nose.  If your condition is caused by bacteria, you will be given an antibiotic medicine. If your condition is caused by a fungus, you will be given an antifungal medicine. Surgery may be needed to correct underlying conditions, such as narrow nasal passages. Surgery may also be needed to remove polyps. Follow these instructions at home: Medicines  Take, use, or apply over-the-counter and prescription medicines only as told by your health care provider. These may include nasal sprays.  If you were prescribed an antibiotic medicine, take it as told by your health care provider. Do not stop taking the antibiotic even if you start to feel better. Hydrate and Humidify  Drink enough water to keep your urine clear or pale yellow. Staying hydrated will help to thin your mucus.  Use a cool mist humidifier to keep the humidity level in your home above 50%.  Inhale steam for 10-15 minutes, 3-4 times a day or as told by your health care provider. You can do this in the bathroom while a hot shower is running.  Limit your exposure to cool or dry air. Rest  Rest as much as possible.  Sleep with your head raised (elevated).  Make sure to get enough sleep each night. General instructions  Apply a warm, moist washcloth to your face 3-4 times a day or as told by your health care provider. This will help with discomfort.  Wash your hands often with soap and water to reduce your exposure to viruses and other germs. If soap and water are not available, use hand sanitizer.  Do not smoke. Avoid being around people who are smoking (secondhand smoke).  Keep all follow-up visits as told by your health care provider. This is important. Contact a health care provider if:  You have a  fever.  Your symptoms get worse.  Your symptoms do not improve within 10 days. Get help right away if:  You have a severe headache.  You have persistent vomiting.  You have pain or swelling around your face or eyes.  You have vision problems.  You develop confusion.  Your neck is stiff.  You have trouble breathing. This information is not intended to replace advice given to you by your health care provider. Make sure you discuss any questions you have with your health care provider. Document Released: 10/18/2005 Document Revised: 06/13/2016 Document Reviewed: 08/13/2015 Elsevier Interactive Patient Education  [employer]2018 [employer]

## 2017-11-15 NOTE — Progress Notes (Signed)
Subjective:  I acted as a Neurosurgeon for [employer], CMA   Patient ID: Donna Tapia, female    DOB: [birth year], 67 y.o.   MRN: [id number]  Chief Complaint  Patient presents with  . Fever  . Chills    HPI  Patient is in today for fever and chills --since Thursday   + cough and congestion   Pt has been exhausted even before fevers started.   Pt is wanting labs today.  She is concerned about her thyroid, liver and kidneys.    Patient Care Team: Zola Button, Grayling Congress, DO as PCP - General (Family Medicine) Antony Contras, MD as Consulting Physician (Ophthalmology)   Past Medical History:  Diagnosis Date  . Anemia   . Chicken pox   . Environmental allergies   . Hepatitis B 1978  . Hyperlipidemia     Past Surgical History:  Procedure Laterality Date  . ABDOMINAL HYSTERECTOMY    . APPENDECTOMY    . EVISCERATION  01/07/2012   Procedure: EVISCERATION REPAIR;  Surgeon: Antony Contras, MD;  Location[facility] OR;  Service: Ophthalmology;  Laterality: Left;  Evisceration and removal of contents left eye  . LUMBAR LAMINECTOMY/DECOMPRESSION MICRODISCECTOMY  01/04/2012   Procedure: LUMBAR LAMINECTOMY/DECOMPRESSION MICRODISCECTOMY 1 LEVEL;  Surgeon: Reinaldo Meeker, MD;  Location: [facility] NEURO ORS;  Service: Neurosurgery;  Laterality: N/A;  Lumbar Laminectomy for Epidural Abscess  . TEE WITHOUT CARDIOVERSION  01/10/2012   Procedure: TRANSESOPHAGEAL ECHOCARDIOGRAM (TEE);  Surgeon: Pricilla Riffle, MD;  Location: [facility] ENDOSCOPY;  Service: Cardiovascular;  Laterality: N/A;    Family History  Adopted: Yes  Problem Relation Age of Onset  . Coronary artery disease Mother        Adopted mother  . Parkinsonism Mother        Adopted Mother  . Colon cancer Mother   . Colon cancer Father   . Hyperlipidemia Paternal Aunt   . Hyperlipidemia Paternal Uncle   . Heart disease Unknown        Both families  . Hypertension Unknown     Social History   Socioeconomic History  . Marital status: Single   Spouse name: Not on file  . Number of children: Not on file  . Years of education: Not on file  . Highest education level: Not on file  Social Needs  . Financial resource strain: Not on file  . Food insecurity - worry: Not on file  . Food insecurity - inability: Not on file  . Transportation needs - medical: Not on file  . Transportation needs - non-medical: Not on file  Occupational History  . Occupation: retired  Tobacco Use  . Smoking status: Never Smoker  . Smokeless tobacco: Never Used  Substance and Sexual Activity  . Alcohol use: No  . Drug use: No  . Sexual activity: No  Other Topics Concern  . Not on file  Social History Narrative   Exercise- walks    Outpatient Medications Prior to Visit  Medication Sig Dispense Refill  . acetaminophen (TYLENOL) 325 MG tablet Take 325 mg by mouth every 4 (four) hours as needed. For pain     . artificial tears (LACRILUBE) OINT ophthalmic ointment Apply to eye 4 (four) times daily. 1 Tube 0  . calcium carbonate (OS-CAL - DOSED IN MG OF ELEMENTAL CALCIUM) 1250 (500 Ca) MG tablet Take 1 tablet by mouth daily with breakfast.    . Cholecalciferol (VITAMIN D3) 2000 units TABS Take 1 tablet by mouth daily.    Marland Kitchen  CO ENZYME Q-10 PO Take 1 capsule by mouth daily.    . folic acid (FOLVITE) 400 MCG tablet Take 400 mcg by mouth 3 (three) times a week.     . Multiple Vitamin (MULITIVITAMIN WITH MINERALS) TABS Take 1 tablet by mouth daily.    . Omega-3 Fatty Acids (FISH OIL) 1000 MG CAPS Take 1 capsule by mouth daily.    . sertraline (ZOLOFT) 50 MG tablet Take 1 tablet (50 mg total) by mouth daily. 90 tablet 3  . promethazine-dextromethorphan (PROMETHAZINE-DM) 6.25-15 MG/5ML syrup Take 5 mLs by mouth 4 (four) times daily as needed. 118 mL 0   No facility-administered medications prior to visit.     Allergies  Allergen Reactions  . Sulfur Anaphylaxis and Rash    Review of Systems  Constitutional: Positive for chills and fever. Negative for  malaise/fatigue.  HENT: Negative for congestion and hearing loss.   Eyes: Negative for discharge.  Respiratory: Positive for cough. Negative for sputum production and shortness of breath.   Cardiovascular: Negative for chest pain, palpitations and leg swelling.  Gastrointestinal: Negative for abdominal pain, blood in stool, constipation, diarrhea, heartburn, nausea and vomiting.  Genitourinary: Negative for dysuria, frequency, hematuria and urgency.  Musculoskeletal: Negative for back pain, falls and myalgias.  Skin: Negative for rash.  Neurological: Negative for dizziness, sensory change, loss of consciousness, weakness and headaches.  Endo/Heme/Allergies: Negative for environmental allergies. Does not bruise/bleed easily.  Psychiatric/Behavioral: Negative for depression and suicidal ideas. The patient is not nervous/anxious and does not have insomnia.        Objective:    Physical Exam  Constitutional: She is oriented to person, place, and time. She appears well-developed and well-nourished.  HENT:  Right Ear: External ear normal.  Left Ear: External ear normal.  Nose: Right sinus exhibits maxillary sinus tenderness and frontal sinus tenderness. Left sinus exhibits maxillary sinus tenderness and frontal sinus tenderness.  + PND + errythema  Eyes: Conjunctivae are normal. Right eye exhibits no discharge. Left eye exhibits no discharge.  Cardiovascular: Normal rate, regular rhythm and normal heart sounds.  No murmur heard. Pulmonary/Chest: Effort normal. No respiratory distress. She has decreased breath sounds. She has no wheezes. She has no rales. She exhibits no tenderness.  Musculoskeletal: She exhibits no edema.  Lymphadenopathy:    She has cervical adenopathy.  Neurological: She is alert and oriented to person, place, and time.  Nursing note and vitals reviewed.   BP (!) 99/52   Pulse 95   Temp (!) 101 F (38.3 C) (Oral)   Ht 5\' 8"  (1.727 m)   Wt 134 lb (60.8 kg)   SpO2  99%   BMI 20.37 kg/m  Wt Readings from Last 3 Encounters:  11/15/17 134 lb (60.8 kg)  08/19/17 132 lb (59.9 kg)  07/21/17 130 lb 3.2 oz (59.1 kg)   BP Readings from Last 3 Encounters:  11/15/17 (!) 99/52  08/19/17 102/62  07/21/17 (!) 114/56     Immunization History  Administered Date(s) Administered  . Influenza, High Dose Seasonal PF 07/21/2017  . PPD Test 12/30/2011  . Pneumococcal Conjugate-13 04/15/2014  . Pneumococcal Polysaccharide-23 11/02/2011    Health Maintenance  Topic Date Due  . [name]  02/13/1968  . PNA vac Low Risk Adult (2 of 2 - PPSV23) 11/01/2016  . MAMMOGRAM  07/28/2017  . DEXA SCAN  07/09/2019  . COLONOSCOPY  06/30/2025  . INFLUENZA VACCINE  Completed  . Hepatitis C Screening  Completed    Lab Results  Component Value Date   WBC 7.1 07/06/2017   HGB 11.8 (L) 07/06/2017   HCT 36.0 07/06/2017   PLT 290.0 07/06/2017   GLUCOSE 98 06/14/2017   CHOL 229 (H) 05/17/2017   TRIG 177.0 (H) 05/17/2017   HDL 49.90 05/17/2017   LDLDIRECT 177.0 05/11/2016   LDLCALC 143 (H) 05/17/2017   ALT 74 (H) 06/14/2017   AST 37 06/14/2017   NA 134 (L) 06/14/2017   K 3.8 06/14/2017   CL 100 06/14/2017   CREATININE 0.83 06/14/2017   BUN 15 06/14/2017   CO2 27 06/14/2017   TSH 3.84 05/17/2017   INR 1.19 01/06/2012    Lab Results  Component Value Date   TSH 3.84 05/17/2017   Lab Results  Component Value Date   WBC 7.1 07/06/2017   HGB 11.8 (L) 07/06/2017   HCT 36.0 07/06/2017   MCV 97.5 07/06/2017   PLT 290.0 07/06/2017   Lab Results  Component Value Date   NA 134 (L) 06/14/2017   K 3.8 06/14/2017   CO2 27 06/14/2017   GLUCOSE 98 06/14/2017   BUN 15 06/14/2017   CREATININE 0.83 06/14/2017   BILITOT 0.7 06/14/2017   ALKPHOS 207 (H) 06/14/2017   AST 37 06/14/2017   ALT 74 (H) 06/14/2017   PROT 6.5 06/14/2017   ALBUMIN 3.4 (L) 06/14/2017   CALCIUM 9.0 06/14/2017   GFR 72.59 06/14/2017   Lab Results  Component Value Date   CHOL 229 (H)  05/17/2017   Lab Results  Component Value Date   HDL 49.90 05/17/2017   Lab Results  Component Value Date   LDLCALC 143 (H) 05/17/2017   Lab Results  Component Value Date   TRIG 177.0 (H) 05/17/2017   Lab Results  Component Value Date   CHOLHDL 5 05/17/2017   No results found for: HGBA1C       Assessment & Plan:   Problem List Items Addressed This Visit      Unprioritized   Anemia - Primary   Relevant Orders   CBC with Differential/Platelet   IBC panel    Other Visit Diagnoses    Cough       Relevant Orders   CBC with Differential/Platelet   Comprehensive metabolic panel   TSH   T3, free   T4, free   IBC panel   DG Chest 2 View   POCT Influenza A/B (Completed)   Hot flashes       Relevant Orders   CBC with Differential/Platelet   Comprehensive metabolic panel   TSH   T3, free   T4, free   IBC panel   Fatigue, unspecified type       Relevant Orders   CBC with Differential/Platelet   Comprehensive metabolic panel   TSH   T3, free   T4, free   IBC panel   H/O cold sores       Relevant Medications   valACYclovir (VALTREX) 1000 MG tablet   Fever, unspecified fever cause       Relevant Orders   POCT Influenza A/B (Completed)   Pansinusitis, unspecified chronicity       Relevant Medications   valACYclovir (VALTREX) 1000 MG tablet   amoxicillin-clavulanate (AUGMENTIN) 875-125 MG tablet      I have discontinued Fara Chute promethazine-dextromethorphan. I am also having her start on valACYclovir and amoxicillin-clavulanate. Additionally, I am having her maintain her multivitamin with minerals, CO ENZYME Q-10 PO, acetaminophen, artificial tears, Fish Oil, folic acid, calcium carbonate, Vitamin D3,  and sertraline.  Meds ordered this encounter  Medications  . valACYclovir (VALTREX) 1000 MG tablet    Sig: Take 1 tablet (1,000 mg total) by mouth 2 (two) times daily.    Dispense:  30 tablet    Refill:  2  . amoxicillin-clavulanate (AUGMENTIN)  875-125 MG tablet    Sig: Take 1 tablet by mouth 2 (two) times daily.    Dispense:  20 tablet    Refill:  0    CMA served as scribe during this visit. History, Physical and Plan performed by medical provider. Documentation and orders reviewed and attested to.  Donato SchultzYvonne R Lowne Chase, DO

## 2017-11-16 ENCOUNTER — Other Ambulatory Visit: Payer: Self-pay | Admitting: Family Medicine

## 2017-11-16 ENCOUNTER — Telehealth: Payer: Self-pay | Admitting: Family Medicine

## 2017-11-16 DIAGNOSIS — R9389 Abnormal findings on diagnostic imaging of other specified body structures: Secondary | ICD-10-CM

## 2017-11-16 LAB — CBC WITH DIFFERENTIAL/PLATELET
Basophils Absolute: 0.1 10*3/uL (ref 0.0–0.1)
Basophils Relative: 0.5 % (ref 0.0–3.0)
EOS ABS: 0 10*3/uL (ref 0.0–0.7)
Eosinophils Relative: 0.1 % (ref 0.0–5.0)
HEMATOCRIT: 31 % — AB (ref 36.0–46.0)
HEMOGLOBIN: 10.2 g/dL — AB (ref 12.0–15.0)
LYMPHS PCT: 6.8 % — AB (ref 12.0–46.0)
Lymphs Abs: 1.1 10*3/uL (ref 0.7–4.0)
MCHC: 32.9 g/dL (ref 30.0–36.0)
MCV: 96.2 fl (ref 78.0–100.0)
Monocytes Absolute: 0.9 10*3/uL (ref 0.1–1.0)
Monocytes Relative: 5.8 % (ref 3.0–12.0)
NEUTROS ABS: 14.1 10*3/uL — AB (ref 1.4–7.7)
Neutrophils Relative %: 86.8 % — ABNORMAL HIGH (ref 43.0–77.0)
PLATELETS: 324 10*3/uL (ref 150.0–400.0)
RBC: 3.23 Mil/uL — AB (ref 3.87–5.11)
RDW: 14.2 % (ref 11.5–15.5)
WBC: 16.2 10*3/uL — AB (ref 4.0–10.5)

## 2017-11-16 LAB — COMPREHENSIVE METABOLIC PANEL
ALBUMIN: 3.6 g/dL (ref 3.5–5.2)
ALK PHOS: 158 U/L — AB (ref 39–117)
ALT: 39 U/L — AB (ref 0–35)
AST: 44 U/L — AB (ref 0–37)
BUN: 21 mg/dL (ref 6–23)
CHLORIDE: 103 meq/L (ref 96–112)
CO2: 24 mEq/L (ref 19–32)
Calcium: 8.7 mg/dL (ref 8.4–10.5)
Creatinine, Ser: 0.9 mg/dL (ref 0.40–1.20)
GFR: 66.03 mL/min (ref >60.00)
GLUCOSE: 96 mg/dL (ref 70–99)
POTASSIUM: 4.8 meq/L (ref 3.5–5.1)
Sodium: 138 mEq/L (ref 135–145)
TOTAL PROTEIN: 7.4 g/dL (ref 6.0–8.3)
Total Bilirubin: 0.7 mg/dL (ref 0.2–1.2)

## 2017-11-16 LAB — T3, FREE: T3, Free: 3.6 pg/mL (ref 2.3–4.2)

## 2017-11-16 LAB — IBC PANEL
IRON: 9 ug/dL — AB (ref 42–145)
Saturation Ratios: 3.6 % — ABNORMAL LOW (ref 20.0–50.0)
TRANSFERRIN: 177 mg/dL — AB (ref 212.0–360.0)

## 2017-11-16 LAB — TSH: TSH: 2.21 u[IU]/mL (ref 0.35–4.50)

## 2017-11-16 LAB — T4, FREE: FREE T4: 1.16 ng/dL (ref 0.60–1.60)

## 2017-11-16 NOTE — Telephone Encounter (Signed)
Please inform pt of her abn cxr --- worse than previous one We need to get a ct

## 2017-11-16 NOTE — Telephone Encounter (Signed)
[facility]  Markedly Abnormal Appearance of R lung has developed since previous study- this is worrisome for a progressive infectious process including TB- though malignancy is not excluded. Chest CT is recommended.

## 2017-11-17 NOTE — Telephone Encounter (Signed)
Patient notified.  See xray notes.

## 2017-11-18 ENCOUNTER — Telehealth: Payer: Self-pay | Admitting: Family Medicine

## 2017-11-18 DIAGNOSIS — D72829 Elevated white blood cell count, unspecified: Secondary | ICD-10-CM

## 2017-11-18 NOTE — Telephone Encounter (Signed)
Doxycycline 100 mg bid x 10 days  

## 2017-11-18 NOTE — Telephone Encounter (Signed)
Copied from CRM [phone]. Topic: Quick Communication - See Telephone Encounter >> Nov 18, 2017  8:30 AM Jolayne Hainesaylor, Brittany L wrote: CRM for notification. See Telephone encounter for:   11/18/17.  Patient states she is into her 3rd day of penicillin & she is having soreness in her mouth & diarrhea. She wants to know if the dr can change it to something else.   Best call back is [phone]. [facility] - , [place] - [address]

## 2017-11-19 ENCOUNTER — Other Ambulatory Visit: Payer: Self-pay | Admitting: Family Medicine

## 2017-11-19 MED ORDER — DOXYCYCLINE HYCLATE 100 MG PO TABS: 100.0000 mg | ORAL_TABLET | Freq: Two times a day (BID) | ORAL | 0 refills | Status: DC

## 2017-11-21 NOTE — Telephone Encounter (Signed)
Patient doing a little better she picked up antibiotic.  She will be in on Thursday to recheck cbc.

## 2017-11-23 ENCOUNTER — Telehealth: Payer: Self-pay | Admitting: Family Medicine

## 2017-11-23 NOTE — Telephone Encounter (Signed)
Pt is requesting a copy of her last lab report to be sent to her home address. Would I send this or do you send this to her?

## 2017-11-24 ENCOUNTER — Other Ambulatory Visit (INDEPENDENT_AMBULATORY_CARE_PROVIDER_SITE_OTHER): Payer: 59

## 2017-11-24 ENCOUNTER — Encounter: Payer: Self-pay | Admitting: *Deleted

## 2017-11-24 ENCOUNTER — Telehealth: Payer: Self-pay | Admitting: *Deleted

## 2017-11-24 ENCOUNTER — Ambulatory Visit (HOSPITAL_BASED_OUTPATIENT_CLINIC_OR_DEPARTMENT_OTHER)
Admission: RE | Admit: 2017-11-24 | Discharge: 2017-11-24 | Disposition: A | Discharge status: Home / Self Care | Payer: 59 | Source: Ambulatory Visit | Attending: Family Medicine | Admitting: Family Medicine

## 2017-11-24 DIAGNOSIS — I700 Atherosclerosis of aorta: Secondary | ICD-10-CM | POA: Insufficient documentation

## 2017-11-24 DIAGNOSIS — J470 Bronchiectasis with acute lower respiratory infection: Secondary | ICD-10-CM | POA: Insufficient documentation

## 2017-11-24 DIAGNOSIS — J189 Pneumonia, unspecified organism: Secondary | ICD-10-CM | POA: Diagnosis not present

## 2017-11-24 DIAGNOSIS — D72829 Elevated white blood cell count, unspecified: Secondary | ICD-10-CM

## 2017-11-24 DIAGNOSIS — R9389 Abnormal findings on diagnostic imaging of other specified body structures: Secondary | ICD-10-CM | POA: Diagnosis present

## 2017-11-24 LAB — CBC WITH DIFFERENTIAL/PLATELET
BASOS ABS: 0.1 10*3/uL (ref 0.0–0.1)
Basophils Relative: 1.1 % (ref 0.0–3.0)
EOS PCT: 1.6 % (ref 0.0–5.0)
Eosinophils Absolute: 0.1 10*3/uL (ref 0.0–0.7)
HCT: 32.3 % — ABNORMAL LOW (ref 36.0–46.0)
Hemoglobin: 11 g/dL — ABNORMAL LOW (ref 12.0–15.0)
LYMPHS ABS: 1.8 10*3/uL (ref 0.7–4.0)
Lymphocytes Relative: 27.6 % (ref 12.0–46.0)
MCHC: 34.2 g/dL (ref 30.0–36.0)
MCV: 94.6 fl (ref 78.0–100.0)
MONOS PCT: 5.2 % (ref 3.0–12.0)
Monocytes Absolute: 0.3 10*3/uL (ref 0.1–1.0)
NEUTROS ABS: 4.1 10*3/uL (ref 1.4–7.7)
NEUTROS PCT: 64.5 % (ref 43.0–77.0)
PLATELETS: 713 10*3/uL — AB (ref 150.0–400.0)
RBC: 3.41 Mil/uL — ABNORMAL LOW (ref 3.87–5.11)
RDW: 13.9 % (ref 11.5–15.5)
WBC: 6.4 10*3/uL (ref 4.0–10.5)

## 2017-11-24 NOTE — Telephone Encounter (Signed)
Take the levaquin She should have f/u 2 weeks from last visit

## 2017-11-24 NOTE — Telephone Encounter (Signed)
Patient notified.  She wants to know if she needs to take the levaquin and doxy together or just levaquin.   Also she wanted know if she can come in (ov) and talk to you about how to prevent this from happening again.

## 2017-11-24 NOTE — Telephone Encounter (Signed)
-----   Message from Donato SchultzYvonne R Lowne Chase, DO sent at 11/24/2017  5:28 PM EST ----- + pneumonia---  levaquin 500 mg 1 po qd x 7 days  Recheck cxr in 3-4 weeks

## 2017-11-24 NOTE — Telephone Encounter (Signed)
Labs mailed to patient and patient notified.

## 2017-11-25 ENCOUNTER — Telehealth: Payer: Self-pay | Admitting: Family Medicine

## 2017-11-25 MED ORDER — LEVOFLOXACIN 500 MG PO TABS: 500.0000 mg | ORAL_TABLET | Freq: Every day | ORAL | 0 refills | Status: DC

## 2017-11-25 NOTE — Telephone Encounter (Signed)
Copied from CRM [phone]. Topic: Quick Communication - See Telephone Encounter >> Nov 25, 2017 10:57 AM Cipriano BunkerLambe, Annette S wrote: CRM for notification. See Telephone encounter for:   Patient is calling to see what medication should she be taking.Marland Kitchen. Spoke to nurse last night.. And have not heard anything back.  Asking if called into pharmacy  Please call patient and let her know.   [facility] [address] - [place] - [address] [place] [id number] Phone: [phone] Fax: [phone]   11/25/17.

## 2017-11-25 NOTE — Telephone Encounter (Signed)
Patient notified. See other phone note.  

## 2017-11-25 NOTE — Telephone Encounter (Signed)
Patient notified.  [facility]. Patient has appt scheduled for next tuesday

## 2017-11-29 ENCOUNTER — Ambulatory Visit (INDEPENDENT_AMBULATORY_CARE_PROVIDER_SITE_OTHER): Payer: 59 | Admitting: Family Medicine

## 2017-11-29 ENCOUNTER — Encounter: Payer: Self-pay | Admitting: Family Medicine

## 2017-11-29 VITALS — BP 102/56 | HR 81 | Temp 98.0°F | Resp 16 | Ht 68.0 in | Wt 130.2 lb

## 2017-11-29 DIAGNOSIS — D509 Iron deficiency anemia, unspecified: Secondary | ICD-10-CM

## 2017-11-29 DIAGNOSIS — E785 Hyperlipidemia, unspecified: Secondary | ICD-10-CM | POA: Diagnosis not present

## 2017-11-29 DIAGNOSIS — J189 Pneumonia, unspecified organism: Secondary | ICD-10-CM | POA: Diagnosis not present

## 2017-11-29 DIAGNOSIS — R748 Abnormal levels of other serum enzymes: Secondary | ICD-10-CM

## 2017-11-29 DIAGNOSIS — J479 Bronchiectasis, uncomplicated: Secondary | ICD-10-CM

## 2017-11-29 DIAGNOSIS — D72829 Elevated white blood cell count, unspecified: Secondary | ICD-10-CM

## 2017-11-29 LAB — CBC WITH DIFFERENTIAL/PLATELET
Basophils Absolute: 0.1 10*3/uL (ref 0.0–0.1)
Basophils Relative: 1.1 % (ref 0.0–3.0)
EOS PCT: 1.5 % (ref 0.0–5.0)
Eosinophils Absolute: 0.1 10*3/uL (ref 0.0–0.7)
HEMATOCRIT: 36.9 % (ref 36.0–46.0)
HEMOGLOBIN: 12.3 g/dL (ref 12.0–15.0)
LYMPHS PCT: 32.7 % (ref 12.0–46.0)
Lymphs Abs: 2.7 10*3/uL (ref 0.7–4.0)
MCHC: 33.4 g/dL (ref 30.0–36.0)
MCV: 94.1 fl (ref 78.0–100.0)
MONOS PCT: 6.1 % (ref 3.0–12.0)
Monocytes Absolute: 0.5 10*3/uL (ref 0.1–1.0)
Neutro Abs: 4.9 10*3/uL (ref 1.4–7.7)
Neutrophils Relative %: 58.6 % (ref 43.0–77.0)
Platelets: 565 10*3/uL — ABNORMAL HIGH (ref 150.0–400.0)
RBC: 3.92 Mil/uL (ref 3.87–5.11)
RDW: 14 % (ref 11.5–15.5)
WBC: 8.3 10*3/uL (ref 4.0–10.5)

## 2017-11-29 LAB — COMPREHENSIVE METABOLIC PANEL
ALBUMIN: 4 g/dL (ref 3.5–5.2)
ALK PHOS: 82 U/L (ref 39–117)
ALT: 15 U/L (ref 0–35)
AST: 21 U/L (ref 0–37)
BUN: 20 mg/dL (ref 6–23)
CALCIUM: 9.3 mg/dL (ref 8.4–10.5)
CHLORIDE: 104 meq/L (ref 96–112)
CO2: 27 mEq/L (ref 19–32)
CREATININE: 0.91 mg/dL (ref 0.40–1.20)
GFR: 65.19 mL/min (ref >60.00)
Glucose, Bld: 86 mg/dL (ref 70–99)
POTASSIUM: 5 meq/L (ref 3.5–5.1)
Sodium: 139 mEq/L (ref 135–145)
TOTAL PROTEIN: 7.5 g/dL (ref 6.0–8.3)
Total Bilirubin: 0.4 mg/dL (ref 0.2–1.2)

## 2017-11-29 LAB — FERRITIN: Ferritin: 302 ng/mL — ABNORMAL HIGH (ref 10.0–291.0)

## 2017-11-29 LAB — LIPID PANEL
CHOLESTEROL: 211 mg/dL — AB (ref 0–200)
HDL: 51.5 mg/dL (ref >39.00)
LDL CALC: 127 mg/dL — AB (ref 0–99)
NONHDL: 159.66
Total CHOL/HDL Ratio: 4
Triglycerides: 162 mg/dL — ABNORMAL HIGH (ref 0.0–149.0)
VLDL: 32.4 mg/dL (ref 0.0–40.0)

## 2017-11-29 LAB — IBC PANEL
IRON: 86 ug/dL (ref 42–145)
Saturation Ratios: 22.8 % (ref 20.0–50.0)
TRANSFERRIN: 269 mg/dL (ref 212.0–360.0)

## 2017-11-29 NOTE — Assessment & Plan Note (Signed)
Recheck today. 

## 2017-11-29 NOTE — Assessment & Plan Note (Signed)
Encouraged heart healthy diet, increase exercise, avoid trans fats, consider a krill oil cap daily 

## 2017-11-29 NOTE — Patient Instructions (Signed)

## 2017-11-29 NOTE — Progress Notes (Signed)
Patient ID: Jim Desanctisina E Jakob, female   DOB: 06/18/1951, 67 y.o.   MRN: [id number]    Subjective:  I acted as a Neurosurgeonscribe for Dr. Zola ButtonLowne-Chase.  Apolonio SchneidersSheketia, CMA   Patient ID: Jim Desanctisina E Luedke, female    DOB: 06/18/1951, 67 y.o.   MRN: [id number]  Chief Complaint  Patient presents with  . Follow-up    HPI  Patient is in today for follow up pneumonia.  She is tired a lot.  She is here to go over labs   She has a lot of questions about the labs.   No other problems except fatigue from pneumonia.    Patient Care Team: Zola ButtonLowne Chase, Grayling CongressYvonne R, DO as PCP - General (Family Medicine) Antony ContrasLyles, Graham, MD as Consulting Physician (Ophthalmology)   Past Medical History:  Diagnosis Date  . Anemia   . Chicken pox   . Environmental allergies   . Hepatitis B 1978  . Hyperlipidemia     Past Surgical History:  Procedure Laterality Date  . ABDOMINAL HYSTERECTOMY    . APPENDECTOMY    . EVISCERATION  01/07/2012   Procedure: EVISCERATION REPAIR;  Surgeon: Antony ContrasGraham Lyles, MD;  [facility];  Service: Ophthalmology;  Laterality: Left;  Evisceration and removal of contents left eye  . LUMBAR LAMINECTOMY/DECOMPRESSION MICRODISCECTOMY  01/04/2012   Procedure: LUMBAR LAMINECTOMY/DECOMPRESSION MICRODISCECTOMY 1 LEVEL;  Surgeon: Reinaldo Meekerandy O Kritzer, MD;  Location: [facility]UR[facility] ORS;  Service: Neurosurgery;  Laterality: N/A;  Lumbar Laminectomy for Epidural Abscess  . TEE WITHOUT CARDIOVERSION  01/10/2012   Procedure: TRANSESOPHAGEAL ECHOCARDIOGRAM (TEE);  Surgeon: Pricilla RifflePaula V Ross, MD;  [facility];  Service: Cardiovascular;  Laterality: N/A;    Family History  Adopted: Yes  Problem Relation Age of Onset  . Coronary artery disease Mother        Adopted mother  . Parkinsonism Mother        Adopted Mother  . Colon cancer Mother   . Colon cancer Father   . Hyperlipidemia Paternal Aunt   . Hyperlipidemia Paternal Uncle   . Heart disease Unknown        Both families  . Hypertension Unknown     Social History    Socioeconomic History  . Marital status: Single    Spouse name: Not on file  . Number of children: Not on file  . Years of education: Not on file  . Highest education level: Not on file  Social Needs  . Financial resource strain: Not on file  . Food insecurity - worry: Not on file  . Food insecurity - inability: Not on file  . Transportation needs - medical: Not on file  . Transportation needs - non-medical: Not on file  Occupational History  . Occupation: retired  Tobacco Use  . Smoking status: Never Smoker  . Smokeless tobacco: Never Used  Substance and Sexual Activity  . Alcohol use: No  . Drug use: No  . Sexual activity: No  Other Topics Concern  . Not on file  Social History Narrative   Exercise- walks    Outpatient Medications Prior to Visit  Medication Sig Dispense Refill  . acetaminophen (TYLENOL) 325 MG tablet Take 325 mg by mouth every 4 (four) hours as needed. For pain     . artificial tears (LACRILUBE) OINT ophthalmic ointment Apply to eye 4 (four) times daily. 1 Tube 0  . calcium carbonate (OS-CAL - DOSED IN MG OF ELEMENTAL CALCIUM) 1250 (500 Ca) MG tablet Take 1 tablet by mouth daily with  breakfast.    . Cholecalciferol (VITAMIN D3) 2000 units TABS Take 1 tablet by mouth daily.    . CO ENZYME Q-10 PO Take 1 capsule by mouth daily.    . folic acid (FOLVITE) 400 MCG tablet Take 400 mcg by mouth 3 (three) times a week.     Marland Kitchen levofloxacin (LEVAQUIN) 500 MG tablet Take 1 tablet (500 mg total) by mouth daily. 7 tablet 0  . Multiple Vitamin (MULITIVITAMIN WITH MINERALS) TABS Take 1 tablet by mouth daily.    . Omega-3 Fatty Acids (FISH OIL) 1000 MG CAPS Take 1 capsule by mouth daily.    . sertraline (ZOLOFT) 50 MG tablet Take 1 tablet (50 mg total) by mouth daily. 90 tablet 3  . valACYclovir (VALTREX) 1000 MG tablet Take 1 tablet (1,000 mg total) by mouth 2 (two) times daily. 30 tablet 2  . doxycycline (VIBRA-TABS) 100 MG tablet Take 1 tablet (100 mg total) by mouth  2 (two) times daily. 20 tablet 0   No facility-administered medications prior to visit.     Allergies  Allergen Reactions  . Sulfur Anaphylaxis and Rash    Review of Systems  Constitutional: Negative for fever and malaise/fatigue.  HENT: Negative for congestion.   Eyes: Negative for blurred vision.  Respiratory: Negative for cough and shortness of breath.   Cardiovascular: Negative for chest pain, palpitations and leg swelling.  Gastrointestinal: Negative for vomiting.  Musculoskeletal: Negative for back pain.  Skin: Negative for rash.  Neurological: Negative for loss of consciousness and headaches.       Objective:    Physical Exam  Constitutional: She is oriented to person, place, and time. She appears well-developed and well-nourished.  HENT:  Head: Normocephalic and atraumatic.  Eyes: Conjunctivae and EOM are normal.  Neck: Normal range of motion. Neck supple. No JVD present. Carotid bruit is not present. No thyromegaly present.  Cardiovascular: Normal rate, regular rhythm and normal heart sounds.  No murmur heard. Pulmonary/Chest: Effort normal and breath sounds normal. No respiratory distress. She has no wheezes. She has no rales. She exhibits no tenderness.  Musculoskeletal: She exhibits no edema.  Neurological: She is alert and oriented to person, place, and time.  Psychiatric: She has a normal mood and affect.  Nursing note and vitals reviewed.   BP (!) 102/56 (BP Location: Left Arm, Cuff Size: Normal)   Pulse 81   Temp 98 F (36.7 C) (Oral)   Resp 16   Ht 5\' 8"  (1.727 m)   Wt 130 lb 3.2 oz (59.1 kg)   SpO2 96%   BMI 19.80 kg/m  Wt Readings from Last 3 Encounters:  11/29/17 130 lb 3.2 oz (59.1 kg)  11/15/17 134 lb (60.8 kg)  08/19/17 132 lb (59.9 kg)   BP Readings from Last 3 Encounters:  11/29/17 (!) 102/56  11/15/17 (!) 99/52  08/19/17 102/62     Immunization History  Administered Date(s) Administered  . Influenza, High Dose Seasonal PF  07/21/2017  . PPD Test 12/30/2011  . Pneumococcal Conjugate-13 04/15/2014  . Pneumococcal Polysaccharide-23 11/02/2011    Health Maintenance  Topic Date Due  . [name]  02/13/1968  . PNA vac Low Risk Adult (2 of 2 - PPSV23) 11/01/2016  . MAMMOGRAM  07/28/2017  . DEXA SCAN  07/09/2019  . COLONOSCOPY  06/30/2025  . INFLUENZA VACCINE  Completed  . Hepatitis C Screening  Completed    Lab Results  Component Value Date   WBC 8.3 11/29/2017   HGB 12.3 11/29/2017  HCT 36.9 11/29/2017   PLT 565.0 (H) 11/29/2017   GLUCOSE 86 11/29/2017   CHOL 211 (H) 11/29/2017   TRIG 162.0 (H) 11/29/2017   HDL 51.50 11/29/2017   LDLDIRECT 177.0 05/11/2016   LDLCALC 127 (H) 11/29/2017   ALT 15 11/29/2017   AST 21 11/29/2017   NA 139 11/29/2017   K 5.0 11/29/2017   CL 104 11/29/2017   CREATININE 0.91 11/29/2017   BUN 20 11/29/2017   CO2 27 11/29/2017   TSH 2.21 11/15/2017   INR 1.19 01/06/2012    Lab Results  Component Value Date   TSH 2.21 11/15/2017   Lab Results  Component Value Date   WBC 8.3 11/29/2017   HGB 12.3 11/29/2017   HCT 36.9 11/29/2017   MCV 94.1 11/29/2017   PLT 565.0 (H) 11/29/2017   Lab Results  Component Value Date   NA 139 11/29/2017   K 5.0 11/29/2017   CO2 27 11/29/2017   GLUCOSE 86 11/29/2017   BUN 20 11/29/2017   CREATININE 0.91 11/29/2017   BILITOT 0.4 11/29/2017   ALKPHOS 82 11/29/2017   AST 21 11/29/2017   ALT 15 11/29/2017   PROT 7.5 11/29/2017   ALBUMIN 4.0 11/29/2017   CALCIUM 9.3 11/29/2017   GFR 65.19 11/29/2017   Lab Results  Component Value Date   CHOL 211 (H) 11/29/2017   Lab Results  Component Value Date   HDL 51.50 11/29/2017   Lab Results  Component Value Date   LDLCALC 127 (H) 11/29/2017   Lab Results  Component Value Date   TRIG 162.0 (H) 11/29/2017   Lab Results  Component Value Date   CHOLHDL 4 11/29/2017   No results found for: HGBA1C       Assessment & Plan:   Problem List Items Addressed This Visit        Unprioritized   Anemia    Recheck today      Relevant Orders   IBC panel (Completed)   Ferritin (Completed)   Bronchiectasis without complication (HCC)    Per pulmonary        Hyperlipidemia LDL goal <100    Encouraged heart healthy diet, increase exercise, avoid trans fats, consider a krill oil cap daily       Relevant Orders   Lipid panel (Completed)    Other Visit Diagnoses    Elevated liver enzymes    -  Primary   Relevant Orders   Comprehensive metabolic panel (Completed)   Leukocytosis, unspecified type       Relevant Orders   CBC with Differential/Platelet (Completed)   Pneumonia due to infectious organism, unspecified laterality, unspecified part of lung       Relevant Orders   DG Chest 2 View    pt had mult questions about   I have discontinued Madaleine E. Manuele's doxycycline. I am also having her maintain her multivitamin with minerals, CO ENZYME Q-10 PO, acetaminophen, artificial tears, Fish Oil, folic acid, calcium carbonate, Vitamin D3, sertraline, valACYclovir, and levofloxacin.  No orders of the defined types were placed in this encounter.   CMA served as Neurosurgeon during this visit. History, Physical and Plan performed by medical provider. Documentation and orders reviewed and attested to.  [name], DO

## 2017-11-29 NOTE — Assessment & Plan Note (Signed)
Per pulmonary 

## 2017-12-15 ENCOUNTER — Ambulatory Visit (HOSPITAL_BASED_OUTPATIENT_CLINIC_OR_DEPARTMENT_OTHER)
Admission: RE | Admit: 2017-12-15 | Discharge: 2017-12-15 | Disposition: A | Discharge status: Home / Self Care | Payer: 59 | Source: Ambulatory Visit | Attending: Family Medicine | Admitting: Family Medicine

## 2017-12-15 DIAGNOSIS — J189 Pneumonia, unspecified organism: Secondary | ICD-10-CM | POA: Diagnosis not present

## 2017-12-15 DIAGNOSIS — J440 Chronic obstructive pulmonary disease with acute lower respiratory infection: Secondary | ICD-10-CM | POA: Diagnosis not present

## 2017-12-16 ENCOUNTER — Telehealth: Payer: Self-pay | Admitting: *Deleted

## 2017-12-16 DIAGNOSIS — Z8701 Personal history of pneumonia (recurrent): Secondary | ICD-10-CM

## 2017-12-16 NOTE — Telephone Encounter (Signed)
-----   Message from Donato SchultzYvonne R Lowne Chase, DO sent at 12/15/2017  4:18 PM EST ----- Almost completely resolved  Recheck 2-3 weeks

## 2017-12-16 NOTE — Telephone Encounter (Signed)
IT CAN JUST BE OV UNLESS SHE HAS SYMPTOMS

## 2017-12-16 NOTE — Telephone Encounter (Signed)
Did you want patient to come back for CXR or an OV?

## 2017-12-19 NOTE — Telephone Encounter (Signed)
Patient notified order placed for cxr

## 2018-01-11 ENCOUNTER — Ambulatory Visit (HOSPITAL_BASED_OUTPATIENT_CLINIC_OR_DEPARTMENT_OTHER)
Admission: RE | Admit: 2018-01-11 | Discharge: 2018-01-11 | Disposition: A | Discharge status: Home / Self Care | Payer: 59 | Source: Ambulatory Visit | Attending: Family Medicine | Admitting: Family Medicine

## 2018-01-11 DIAGNOSIS — J439 Emphysema, unspecified: Secondary | ICD-10-CM | POA: Insufficient documentation

## 2018-01-11 DIAGNOSIS — Z8701 Personal history of pneumonia (recurrent): Secondary | ICD-10-CM | POA: Diagnosis not present

## 2018-01-11 DIAGNOSIS — J984 Other disorders of lung: Secondary | ICD-10-CM | POA: Insufficient documentation

## 2018-02-07 ENCOUNTER — Telehealth: Payer: Self-pay | Admitting: Family Medicine

## 2018-02-07 NOTE — Telephone Encounter (Signed)
Prolia benefits received PA not required Medicare/Medicaid secondary should cover  **Prolia needs to be called to pharmacy and administered in our office for insurance benefits   Patient may owe approximately $0 OOP  Patient due after 02/27/18  Letter mailed to inform patient of benefits and to schedule

## 2018-02-22 MED ORDER — DENOSUMAB 60 MG/ML ~~LOC~~ SOSY: 60.0000 mg | PREFILLED_SYRINGE | Freq: Once | SUBCUTANEOUS | 0 refills | Status: AC

## 2018-02-22 NOTE — Telephone Encounter (Signed)
prolia sent to [facility]

## 2018-02-22 NOTE — Telephone Encounter (Signed)
Pt scheduled 03/08/2018. Donna Tapia- Prolia has to be sent to her pharmacy and brought in with her to visit.

## 2018-03-07 ENCOUNTER — Telehealth: Payer: Self-pay | Admitting: Family Medicine

## 2018-03-07 NOTE — Telephone Encounter (Signed)
Called pt to confirm her nurse visit appt. Pt asked that I put in a note to request a form for parking at her job be looked over and signed. I informed that generally speaking, paperwork takes 5-7 business days to complete.  Pt stated that the form is already filled out, it just needs to be reviewed and signed.

## 2018-03-07 NOTE — Telephone Encounter (Signed)
Where is the form?

## 2018-03-07 NOTE — Telephone Encounter (Signed)
She is planning to bring it to her nurse visit tomorrow.

## 2018-03-07 NOTE — Telephone Encounter (Signed)
Ok thank you 

## 2018-03-08 ENCOUNTER — Ambulatory Visit (INDEPENDENT_AMBULATORY_CARE_PROVIDER_SITE_OTHER): Payer: 59 | Admitting: *Deleted

## 2018-03-08 DIAGNOSIS — M810 Age-related osteoporosis without current pathological fracture: Secondary | ICD-10-CM

## 2018-03-08 MED ORDER — DENOSUMAB 60 MG/ML ~~LOC~~ SOSY
60.0000 mg | PREFILLED_SYRINGE | Freq: Once | SUBCUTANEOUS | Status: AC
  Administered 2018-03-08: 60 mg via SUBCUTANEOUS

## 2018-03-08 NOTE — Progress Notes (Signed)
Noted. Agree with above.  Nicholas Paul Wendling, DO 03/08/18 5:05 PM   

## 2018-03-08 NOTE — Progress Notes (Signed)
Pt here for Prolia injection per Dr Zola Button. Pt brings medication from pharmacy with her for injection.  Prolia given SQ, right arm and pt tolerated injection well.   Pt will be contacted when it is time for next injection around 6 months.  Note will be routed to Dr Carmelia Roller in PCP's absence.

## 2018-05-21 ENCOUNTER — Other Ambulatory Visit: Payer: Self-pay | Admitting: Family Medicine

## 2018-05-21 DIAGNOSIS — F418 Other specified anxiety disorders: Secondary | ICD-10-CM

## 2018-05-21 DIAGNOSIS — Z862 Personal history of diseases of the blood and blood-forming organs and certain disorders involving the immune mechanism: Secondary | ICD-10-CM

## 2018-05-23 ENCOUNTER — Ambulatory Visit (INDEPENDENT_AMBULATORY_CARE_PROVIDER_SITE_OTHER): Payer: 59 | Admitting: Family Medicine

## 2018-05-23 ENCOUNTER — Encounter: Payer: Self-pay | Admitting: Family Medicine

## 2018-05-23 VITALS — BP 120/57 | HR 70 | Temp 98.5°F | Resp 16 | Ht 68.0 in | Wt 136.4 lb

## 2018-05-23 DIAGNOSIS — E785 Hyperlipidemia, unspecified: Secondary | ICD-10-CM

## 2018-05-23 DIAGNOSIS — F418 Other specified anxiety disorders: Secondary | ICD-10-CM | POA: Diagnosis not present

## 2018-05-23 DIAGNOSIS — Z0001 Encounter for general adult medical examination with abnormal findings: Secondary | ICD-10-CM | POA: Diagnosis not present

## 2018-05-23 DIAGNOSIS — R8290 Unspecified abnormal findings in urine: Secondary | ICD-10-CM

## 2018-05-23 DIAGNOSIS — Z23 Encounter for immunization: Secondary | ICD-10-CM | POA: Diagnosis not present

## 2018-05-23 DIAGNOSIS — Z862 Personal history of diseases of the blood and blood-forming organs and certain disorders involving the immune mechanism: Secondary | ICD-10-CM

## 2018-05-23 DIAGNOSIS — I700 Atherosclerosis of aorta: Secondary | ICD-10-CM | POA: Diagnosis not present

## 2018-05-23 DIAGNOSIS — M810 Age-related osteoporosis without current pathological fracture: Secondary | ICD-10-CM | POA: Diagnosis not present

## 2018-05-23 LAB — POC URINALSYSI DIPSTICK (AUTOMATED)
BILIRUBIN UA: NEGATIVE
GLUCOSE UA: NEGATIVE
KETONES UA: NEGATIVE
LEUKOCYTES UA: NEGATIVE
Nitrite, UA: NEGATIVE
Protein, UA: NEGATIVE
RBC UA: NEGATIVE
SPEC GRAV UA: 1.025 (ref 1.010–1.025)
UROBILINOGEN UA: 0.2 U/dL
pH, UA: 6 (ref 5.0–8.0)

## 2018-05-23 LAB — COMPREHENSIVE METABOLIC PANEL
ALT: 24 U/L (ref 0–35)
AST: 25 U/L (ref 0–37)
Albumin: 4.4 g/dL (ref 3.5–5.2)
Alkaline Phosphatase: 53 U/L (ref 39–117)
BUN: 25 mg/dL — ABNORMAL HIGH (ref 6–23)
CALCIUM: 9.3 mg/dL (ref 8.4–10.5)
CHLORIDE: 104 meq/L (ref 96–112)
CO2: 30 meq/L (ref 19–32)
CREATININE: 0.89 mg/dL (ref 0.40–1.20)
GFR: 66.79 mL/min (ref >60.00)
Glucose, Bld: 87 mg/dL (ref 70–99)
POTASSIUM: 4.6 meq/L (ref 3.5–5.1)
SODIUM: 141 meq/L (ref 135–145)
Total Bilirubin: 0.5 mg/dL (ref 0.2–1.2)
Total Protein: 7 g/dL (ref 6.0–8.3)

## 2018-05-23 LAB — CBC WITH DIFFERENTIAL/PLATELET
BASOS PCT: 0.6 % (ref 0.0–3.0)
Basophils Absolute: 0 10*3/uL (ref 0.0–0.1)
EOS ABS: 0.1 10*3/uL (ref 0.0–0.7)
Eosinophils Relative: 1.9 % (ref 0.0–5.0)
HEMATOCRIT: 37.6 % (ref 36.0–46.0)
Hemoglobin: 13 g/dL (ref 12.0–15.0)
Lymphocytes Relative: 35.9 % (ref 12.0–46.0)
Lymphs Abs: 2.6 10*3/uL (ref 0.7–4.0)
MCHC: 34.6 g/dL (ref 30.0–36.0)
MCV: 95.4 fl (ref 78.0–100.0)
MONO ABS: 0.5 10*3/uL (ref 0.1–1.0)
Monocytes Relative: 7.1 % (ref 3.0–12.0)
NEUTROS ABS: 3.9 10*3/uL (ref 1.4–7.7)
Neutrophils Relative %: 54.5 % (ref 43.0–77.0)
PLATELETS: 256 10*3/uL (ref 150.0–400.0)
RBC: 3.94 Mil/uL (ref 3.87–5.11)
RDW: 13.1 % (ref 11.5–15.5)
WBC: 7.3 10*3/uL (ref 4.0–10.5)

## 2018-05-23 LAB — LIPID PANEL
CHOLESTEROL: 246 mg/dL — AB (ref 0–200)
HDL: 49.6 mg/dL (ref >39.00)
NonHDL: 196.26
Total CHOL/HDL Ratio: 5
Triglycerides: 203 mg/dL — ABNORMAL HIGH (ref 0.0–149.0)
VLDL: 40.6 mg/dL — AB (ref 0.0–40.0)

## 2018-05-23 LAB — LDL CHOLESTEROL, DIRECT: Direct LDL: 168 mg/dL

## 2018-05-23 MED ORDER — SERTRALINE HCL 50 MG PO TABS: ORAL_TABLET | ORAL | 3 refills | Status: DC

## 2018-05-23 NOTE — Patient Instructions (Signed)
Preventive Care 69 Years and Older, Female Preventive care refers to lifestyle choices and visits with your health care provider that can promote health and wellness. What does preventive care include?  A yearly physical exam. This is also called an annual well check.  Dental exams once or twice a year.  Routine eye exams. Ask your health care provider how often you should have your eyes checked.  Personal lifestyle choices, including: ? Daily care of your teeth and gums. ? Regular physical activity. ? Eating a healthy diet. ? Avoiding tobacco and drug use. ? Limiting alcohol use. ? Practicing safe sex. ? Taking low-dose aspirin every day. ? Taking vitamin and mineral supplements as recommended by your health care provider. What happens during an annual well check? The services and screenings done by your health care provider during your annual well check will depend on your age, overall health, lifestyle risk factors, and family history of disease. Counseling Your health care provider may ask you questions about your:  Alcohol use.  Tobacco use.  Drug use.  Emotional well-being.  Home and relationship well-being.  Sexual activity.  Eating habits.  History of falls.  Memory and ability to understand (cognition).  Work and work environment.  Reproductive health.  Screening You may have the following tests or measurements:  Height, weight, and BMI.  Blood pressure.  Lipid and cholesterol levels. These may be checked every 5 years, or more frequently if you are over 50 years old.  Skin check.  Lung cancer screening. You may have this screening every year starting at age 55 if you have a 30-pack-year history of smoking and currently smoke or have quit within the past 15 years.  Fecal occult blood test (FOBT) of the stool. You may have this test every year starting at age 50.  Flexible sigmoidoscopy or colonoscopy. You may have a sigmoidoscopy every 5 years or  a colonoscopy every 10 years starting at age 50.  Hepatitis C blood test.  Hepatitis B blood test.  Sexually transmitted disease (STD) testing.  Diabetes screening. This is done by checking your blood sugar (glucose) after you have not eaten for a while (fasting). You may have this done every 1-3 years.  Bone density scan. This is done to screen for osteoporosis. You may have this done starting at age 69.  Mammogram. This may be done every 1-2 years. Talk to your health care provider about how often you should have regular mammograms.  Talk with your health care provider about your test results, treatment options, and if necessary, the need for more tests. Vaccines Your health care provider may recommend certain vaccines, such as:  Influenza vaccine. This is recommended every year.  Tetanus, diphtheria, and acellular pertussis (Tdap, Td) vaccine. You may need a Td booster every 10 years.  Varicella vaccine. You may need this if you have not been vaccinated.  Zoster vaccine. You may need this after age 60.  Measles, mumps, and rubella (MMR) vaccine. You may need at least one dose of MMR if you were born in 1957 or later. You may also need a second dose.  Pneumococcal 13-valent conjugate (PCV13) vaccine. One dose is recommended after age 69.  Pneumococcal polysaccharide (PPSV23) vaccine. One dose is recommended after age 69.  Meningococcal vaccine. You may need this if you have certain conditions.  Hepatitis A vaccine. You may need this if you have certain conditions or if you travel or work in places where you may be exposed to hepatitis   A.  Hepatitis B vaccine. You may need this if you have certain conditions or if you travel or work in places where you may be exposed to hepatitis B.  Haemophilus influenzae type b (Hib) vaccine. You may need this if you have certain conditions.  Talk to your health care provider about which screenings and vaccines you need and how often you  need them. This information is not intended to replace advice given to you by your health care provider. Make sure you discuss any questions you have with your health care provider. Document Released: 11/14/2015 Document Revised: 07/07/2016 Document Reviewed: 08/19/2015 Elsevier Interactive Patient Education  2018 Elsevier Inc.  

## 2018-05-23 NOTE — Progress Notes (Signed)
Subjective:     Donna Tapia is a 67 y.o. female and is here for a comprehensive physical exam. The patient reports no problems. We will also check labs for hyperlipidemia, htn  Social History   Socioeconomic History  . Marital status: Single    Spouse name: Not on file  . Number of children: Not on file  . Years of education: Not on file  . Highest education level: Not on file  Occupational History  . Occupation: retired  Engineer, production  . Financial resource strain: Not on file  . Food insecurity:    Worry: Not on file    Inability: Not on file  . Transportation needs:    Medical: Not on file    Non-medical: Not on file  Tobacco Use  . Smoking status: Never Smoker  . Smokeless tobacco: Never Used  Substance and Sexual Activity  . Alcohol use: No  . Drug use: No  . Sexual activity: Never  Lifestyle  . Physical activity:    Days per week: Not on file    Minutes per session: Not on file  . Stress: Not on file  Relationships  . Social connections:    Talks on phone: Not on file    Gets together: Not on file    Attends religious service: Not on file    Active member of club or organization: Not on file    Attends meetings of clubs or organizations: Not on file    Relationship status: Not on file  . Intimate partner violence:    Fear of current or ex partner: Not on file    Emotionally abused: Not on file    Physically abused: Not on file    Forced sexual activity: Not on file  Other Topics Concern  . Not on file  Social History Narrative   Exercise- walks   Health Maintenance  Topic Date Due  . [name]  02/13/1970  . PNA vac Low Risk Adult (2 of 2 - PPSV23) 11/01/2016  . MAMMOGRAM  07/28/2017  . INFLUENZA VACCINE  06/01/2018  . DEXA SCAN  07/09/2019  . COLONOSCOPY  06/30/2025  . Hepatitis C Screening  Completed    The following portions of the patient's history were reviewed and updated as appropriate:  She  has a past medical history of Anemia, Chicken  pox, Environmental allergies, Hepatitis B (1978), and Hyperlipidemia. She does not have any pertinent problems on file. She  has a past surgical history that includes Abdominal hysterectomy; Evisceration (01/07/2012); TEE without cardioversion (01/10/2012); Lumbar laminectomy/decompression microdiscectomy (01/04/2012); and Appendectomy. Her family history includes Colon cancer in her father and mother; Coronary artery disease in her mother; Heart disease in her unknown relative; Hyperlipidemia in her paternal aunt and paternal uncle; Hypertension in her unknown relative; Parkinsonism in her mother. She was adopted. She  reports that she has never smoked. She has never used smokeless tobacco. She reports that she does not drink alcohol or use drugs. She has a current medication list which includes the following prescription(s): acetaminophen, artificial tears, calcium carbonate, vitamin d3, coenzyme q10, folic acid, levofloxacin, multivitamin with minerals, fish oil, prolia, sertraline, and valacyclovir. Current Outpatient Medications on File Prior to Visit  Medication Sig Dispense Refill  . acetaminophen (TYLENOL) 325 MG tablet Take 325 mg by mouth every 4 (four) hours as needed. For pain     . artificial tears (LACRILUBE) OINT ophthalmic ointment Apply to eye 4 (four) times daily. 1 Tube 0  . calcium  carbonate (OS-CAL - DOSED IN MG OF ELEMENTAL CALCIUM) 1250 (500 Ca) MG tablet Take 1 tablet by mouth daily with breakfast.    . Cholecalciferol (VITAMIN D3) 2000 units TABS Take 1 tablet by mouth daily.    . CO ENZYME Q-10 PO Take 1 capsule by mouth daily.    . folic acid (FOLVITE) 400 MCG tablet Take 400 mcg by mouth 3 (three) times a week.     Marland Kitchen. levofloxacin (LEVAQUIN) 500 MG tablet Take 1 tablet (500 mg total) by mouth daily. 7 tablet 0  . Multiple Vitamin (MULITIVITAMIN WITH MINERALS) TABS Take 1 tablet by mouth daily.    . Omega-3 Fatty Acids (FISH OIL) 1000 MG CAPS Take 1 capsule by mouth daily.    Marland Kitchen.  PROLIA 60 MG/ML SOSY injection INJ 60 MG [place] ONCE FOR 1 DOSE  0  . valACYclovir (VALTREX) 1000 MG tablet Take 1 tablet (1,000 mg total) by mouth 2 (two) times daily. 30 tablet 2   No current facility-administered medications on file prior to visit.    She is allergic to sulfur..  Review of Systems Review of Systems  Constitutional: Negative for activity change, appetite change and fatigue.  HENT: Negative for hearing loss, congestion, tinnitus and ear discharge.  dentist [id number] Eyes: Negative for visual disturbance (see optho q1y -- L eye prosthesis, cataract r   Respiratory: Negative for cough, chest tightness and shortness of breath.   Cardiovascular: Negative for chest pain, palpitations and leg swelling.  Gastrointestinal: Negative for abdominal pain, diarrhea, constipation and abdominal distention.  Genitourinary: Negative for urgency, frequency, decreased urine volume and difficulty urinating.  Musculoskeletal: Negative for back pain, arthralgias and gait problem.  Skin: Negative for color change, pallor and rash.  Neurological: Negative for dizziness, light-headedness, numbness and headaches.  Hematological: Negative for adenopathy. Does not bruise/bleed easily.  Psychiatric/Behavioral: Negative for suicidal ideas, confusion, sleep disturbance, self-injury, dysphoric mood, decreased concentration and agitation.      Objective:    BP (!) 120/57 (BP Location: Right Arm, Patient Position: Sitting, Cuff Size: Small)   Pulse 70   Temp 98.5 F (36.9 C) (Oral)   Resp 16   Ht 5\' 8"  (1.727 m)   Wt 136 lb 6.4 oz (61.9 kg)   SpO2 100%   BMI 20.74 kg/m  General appearance: alert, cooperative, appears stated age and no distress Head: Normocephalic, without obvious abnormality, atraumatic Eyes: negative findings: lids and lashes normal, conjunctivae and sclerae normal and pupils equal, round, reactive to light and accomodation Ears: normal TM's and external ear canals both ears Nose:  Nares normal. Septum midline. Mucosa normal. No drainage or sinus tenderness. Throat: lips, mucosa, and tongue normal; teeth and gums normal Neck: no adenopathy, no carotid bruit, no JVD, supple, symmetrical, trachea midline and thyroid not enlarged, symmetric, no tenderness/mass/nodules Back: symmetric, no curvature. ROM normal. No CVA tenderness. Lungs: clear to auscultation bilaterally Breasts: normal appearance, no masses or tenderness Heart: regular rate and rhythm, S1, S2 normal, no murmur, click, rub or gallop Abdomen: soft, non-tender; bowel sounds normal; no masses,  no organomegaly Pelvic: not indicated; status post hysterectomy, negative ROS Extremities: extremities normal, atraumatic, no cyanosis or edema Pulses: 2+ and symmetric Skin: Skin color, texture, turgor normal. No rashes or lesions Lymph nodes: Cervical, supraclavicular, and axillary nodes normal. Neurologic: Alert and oriented X 3, normal strength and tone. Normal symmetric reflexes. Normal coordination and gait    Assessment:    Healthy female exam.      Plan:  ghm utd Check labs  See After Visit Summary for Counseling Recommendations    1. Depression with anxiety stable - sertraline (ZOLOFT) 50 MG tablet; 1 1/2 po qd  Dispense: 135 tablet; Refill: 3  2. History of anemia Check labs  - sertraline (ZOLOFT) 50 MG tablet; 1 1/2 po qd  Dispense: 135 tablet; Refill: 3  3. Abnormal urine odor   - POCT Urinalysis Dipstick (Automated)  4. Atherosclerosis of aorta (HCC) Baby aspirin daily  - Lipid panel - Comprehensive metabolic panel  5. Hyperlipidemia, unspecified hyperlipidemia type Encouraged heart healthy diet, increase exercise, avoid trans fats, consider a krill oil cap daily - Lipid panel - CBC with Differential/Platelet - Comprehensive metabolic panel  6. Age-related osteoporosis without current pathological fracture bmd next year - Comprehensive metabolic panel  7. Need for pneumococcal  vaccine - Pneumococcal polysaccharide vaccine 23-valent greater than or equal to 2yo subcutaneous/IM

## 2018-05-29 ENCOUNTER — Telehealth: Payer: Self-pay

## 2018-05-29 ENCOUNTER — Telehealth: Payer: Self-pay | Admitting: Family Medicine

## 2018-05-29 DIAGNOSIS — E785 Hyperlipidemia, unspecified: Secondary | ICD-10-CM

## 2018-05-29 DIAGNOSIS — R748 Abnormal levels of other serum enzymes: Secondary | ICD-10-CM

## 2018-05-29 DIAGNOSIS — R799 Abnormal finding of blood chemistry, unspecified: Secondary | ICD-10-CM

## 2018-05-29 NOTE — Telephone Encounter (Signed)
Copied from CRM [id number]. Topic: Quick Communication - Lab Results >> May 29, 2018  6:49 PM Stephannie LiSimmons, Brek Reece L, NT wrote: Patient called and would like return call with her labs please advise

## 2018-05-29 NOTE — Telephone Encounter (Signed)
Author phoned pt. To relay results, and set up a lab appointment in 3 mo for lipid, CMP per Dr. Laury AxonLowne. No answer, author left VM to call back #[phone]

## 2018-05-30 NOTE — Telephone Encounter (Signed)
Patient notified of results and verbalized understanding.  

## 2018-06-16 NOTE — Telephone Encounter (Signed)
Pt. with lab appointment 10/30. Lab orders per Dr. Laury AxonLowne from 7/28 placed.

## 2018-08-11 ENCOUNTER — Telehealth: Payer: Self-pay

## 2018-08-11 NOTE — Telephone Encounter (Signed)
PA benefits initiated, awaiting determination.

## 2018-08-14 NOTE — Telephone Encounter (Signed)
Summary of benefits received, will speak to prolia reps 10/28 for final determination.

## 2018-08-30 ENCOUNTER — Other Ambulatory Visit: Payer: 59

## 2018-08-30 NOTE — Telephone Encounter (Signed)
Benefits verified, no PA required. Pt. expected to owe approximately $0 OOP. Pt. due for next injection after 09/09/18. Author phoned pt. to notify and schedule NV. Pt. states she does not want to do prolia at this time as she has noticed joint pain with last injection. Routed to Dr. Laury Axon  As Lorain Childes. Pt. Stated she will discuss with Dr. Laury Axon at next appointment 11/23/18.

## 2018-11-23 ENCOUNTER — Ambulatory Visit (INDEPENDENT_AMBULATORY_CARE_PROVIDER_SITE_OTHER): Payer: 59 | Admitting: Family Medicine

## 2018-11-23 ENCOUNTER — Encounter: Payer: Self-pay | Admitting: Family Medicine

## 2018-11-23 ENCOUNTER — Ambulatory Visit (HOSPITAL_BASED_OUTPATIENT_CLINIC_OR_DEPARTMENT_OTHER)
Admission: RE | Admit: 2018-11-23 | Discharge: 2018-11-23 | Disposition: A | Discharge status: Home / Self Care | Payer: 59 | Source: Ambulatory Visit | Attending: Family Medicine | Admitting: Family Medicine

## 2018-11-23 VITALS — BP 106/60 | HR 78 | Temp 98.6°F | Resp 16 | Ht 68.0 in | Wt 135.4 lb

## 2018-11-23 DIAGNOSIS — E785 Hyperlipidemia, unspecified: Secondary | ICD-10-CM

## 2018-11-23 DIAGNOSIS — M25511 Pain in right shoulder: Secondary | ICD-10-CM

## 2018-11-23 LAB — COMPREHENSIVE METABOLIC PANEL
ALBUMIN: 4.3 g/dL (ref 3.5–5.2)
ALK PHOS: 57 U/L (ref 39–117)
ALT: 21 U/L (ref 0–35)
AST: 24 U/L (ref 0–37)
BUN: 23 mg/dL (ref 6–23)
CALCIUM: 9.2 mg/dL (ref 8.4–10.5)
CO2: 28 mEq/L (ref 19–32)
Chloride: 105 mEq/L (ref 96–112)
Creatinine, Ser: 0.87 mg/dL (ref 0.40–1.20)
GFR: 64.41 mL/min (ref >60.00)
Glucose, Bld: 79 mg/dL (ref 70–99)
POTASSIUM: 4.4 meq/L (ref 3.5–5.1)
Sodium: 140 mEq/L (ref 135–145)
TOTAL PROTEIN: 6.7 g/dL (ref 6.0–8.3)
Total Bilirubin: 0.5 mg/dL (ref 0.2–1.2)

## 2018-11-23 LAB — LIPID PANEL
CHOLESTEROL: 264 mg/dL — AB (ref 0–200)
HDL: 45.5 mg/dL (ref >39.00)
LDL Cholesterol: 180 mg/dL — ABNORMAL HIGH (ref 0–99)
NonHDL: 218.16
Total CHOL/HDL Ratio: 6
Triglycerides: 193 mg/dL — ABNORMAL HIGH (ref 0.0–149.0)
VLDL: 38.6 mg/dL (ref 0.0–40.0)

## 2018-11-23 NOTE — Patient Instructions (Signed)

## 2018-11-23 NOTE — Assessment & Plan Note (Signed)
Check xray Tylenol arthritis,  aspercreme with lidocaine Sport med/ ortho if no improvement

## 2018-11-23 NOTE — Progress Notes (Signed)
Patient ID: Donna Tapia, female    DOB: 1951-07-13  Age: 68 y.o. MRN: [id number]    Subjective:  Subjective  HPI GERALDYNE HUNSINGER presents for f/u chol and  She c/o R shoulder pain x few weeks.  No known injury  Review of Systems  Constitutional: Negative for appetite change, diaphoresis, fatigue and unexpected weight change.  Eyes: Negative for pain, redness and visual disturbance.  Respiratory: Negative for cough, chest tightness, shortness of breath and wheezing.   Cardiovascular: Negative for chest pain, palpitations and leg swelling.  Endocrine: Negative for cold intolerance, heat intolerance, polydipsia, polyphagia and polyuria.  Genitourinary: Negative for difficulty urinating, dysuria and frequency.  Musculoskeletal: Positive for arthralgias. Negative for joint swelling.  Neurological: Negative for dizziness, light-headedness, numbness and headaches.    History Past Medical History:  Diagnosis Date  . Anemia   . Chicken pox   . Environmental allergies   . Hepatitis B 1978  . Hyperlipidemia     She has a past surgical history that includes Abdominal hysterectomy; Evisceration (01/07/2012); TEE without cardioversion (01/10/2012); Lumbar laminectomy/decompression microdiscectomy (01/04/2012); and Appendectomy.   Her family history includes Colon cancer in her father and mother; Coronary artery disease in her mother; Heart disease in her unknown relative; Hyperlipidemia in her paternal aunt and paternal uncle; Hypertension in her unknown relative; Parkinsonism in her mother. She was adopted.She reports that she has never smoked. She has never used smokeless tobacco. She reports that she does not drink alcohol or use drugs.  Current Outpatient Medications on File Prior to Visit  Medication Sig Dispense Refill  . acetaminophen (TYLENOL) 325 MG tablet Take 325 mg by mouth every 4 (four) hours as needed. For pain     . artificial tears (LACRILUBE) OINT ophthalmic ointment Apply to eye 4  (four) times daily. 1 Tube 0  . calcium carbonate (OS-CAL - DOSED IN MG OF ELEMENTAL CALCIUM) 1250 (500 Ca) MG tablet Take 1 tablet by mouth daily with breakfast.    . Cholecalciferol (VITAMIN D3) 2000 units TABS Take 1 tablet by mouth daily.    . CO ENZYME Q-10 PO Take 1 capsule by mouth daily.    . folic acid (FOLVITE) 400 MCG tablet Take 400 mcg by mouth 3 (three) times a week.     . Multiple Vitamin (MULITIVITAMIN WITH MINERALS) TABS Take 1 tablet by mouth daily.    . Omega-3 Fatty Acids (FISH OIL) 1000 MG CAPS Take 1 capsule by mouth daily.    . sertraline (ZOLOFT) 50 MG tablet 1 1/2 po qd 135 tablet 3   No current facility-administered medications on file prior to visit.      Objective:  Objective  Physical Exam Vitals signs and nursing note reviewed.  Constitutional:      Appearance: She is well-developed.  HENT:     Head: Normocephalic and atraumatic.  Eyes:     Conjunctiva/sclera: Conjunctivae normal.  Neck:     Musculoskeletal: Normal range of motion and neck supple.     Thyroid: No thyromegaly.     Vascular: No carotid bruit or JVD.  Cardiovascular:     Rate and Rhythm: Normal rate and regular rhythm.     Heart sounds: Normal heart sounds. No murmur.  Pulmonary:     Effort: Pulmonary effort is normal. No respiratory distress.     Breath sounds: Normal breath sounds. No wheezing or rales.  Chest:     Chest wall: No tenderness.  Musculoskeletal:  General: Tenderness present.     Right shoulder: She exhibits tenderness and pain. She exhibits normal range of motion, no swelling, no effusion and no crepitus.  Neurological:     Mental Status: She is alert and oriented to person, place, and time.    BP 106/60 (BP Location: Right Arm, Cuff Size: Normal)   Pulse 78   Temp 98.6 F (37 C) (Oral)   Resp 16   Ht 5\' 8"  (1.727 m)   Wt 135 lb 6.4 oz (61.4 kg)   SpO2 98%   BMI 20.59 kg/m  Wt Readings from Last 3 Encounters:  11/23/18 135 lb 6.4 oz (61.4 kg)    05/23/18 136 lb 6.4 oz (61.9 kg)  11/29/17 130 lb 3.2 oz (59.1 kg)     Lab Results  Component Value Date   WBC 7.3 05/23/2018   HGB 13.0 05/23/2018   HCT 37.6 05/23/2018   PLT 256.0 05/23/2018   GLUCOSE 87 05/23/2018   CHOL 246 (H) 05/23/2018   TRIG 203.0 (H) 05/23/2018   HDL 49.60 05/23/2018   LDLDIRECT 168.0 05/23/2018   LDLCALC 127 (H) 11/29/2017   ALT 24 05/23/2018   AST 25 05/23/2018   NA 141 05/23/2018   K 4.6 05/23/2018   CL 104 05/23/2018   CREATININE 0.89 05/23/2018   BUN 25 (H) 05/23/2018   CO2 30 05/23/2018   TSH 2.21 11/15/2017   INR 1.19 01/06/2012    Dg Chest 2 View  Result Date: 01/11/2018 CLINICAL DATA:  68 year old female with a history of pneumonia EXAM: CHEST - 2 VIEW COMPARISON:  12/15/2017, 11/15/2017, CT chest 11/24/2017 FINDINGS: Cardiomediastinal silhouette unchanged in size and contour. Significantly improved airspace disease in the right upper lobe bordering the minor fissure, which demonstrates mild persistent thickening. Architectural distortion in the left hilar and suprahilar region is again noted. Pleuroparenchymal scarring at the apex bilateral lungs. Stigmata of emphysema, with increased retrosternal airspace, flattened hemidiaphragms, increased AP diameter, and hyperinflation on the AP view. No pneumothorax or pleural effusion. IMPRESSION: Near complete resolution of right upper lobe airspace disease. Similar appearance of changes of emphysema and scarring at the left hilar/suprahilar region. Electronically Signed   By: Gilmer Mor D.O.   On: 01/11/2018 16:30     Assessment & Plan:  Plan  I have discontinued Heavenleigh Elefante. Graul's valACYclovir, levofloxacin, and PROLIA. I am also having her maintain her multivitamin with minerals, CO ENZYME Q-10 PO, acetaminophen, artificial tears, Fish Oil, folic acid, calcium carbonate, Vitamin D3, and sertraline.  No orders of the defined types were placed in this encounter.   Problem List Items Addressed  This Visit      Unprioritized   Acute pain of right shoulder    Check xray Tylenol arthritis,  aspercreme with lidocaine Sport med/ ortho if no improvement       Relevant Orders   DG Shoulder Right   Hyperlipidemia - Primary    Encouraged heart healthy diet, increase exercise, avoid trans fats, consider a krill oil cap daily      Relevant Orders   Lipid panel   Comprehensive metabolic panel      Follow-up: No follow-ups on file.  [name], DO

## 2018-11-23 NOTE — Assessment & Plan Note (Signed)
Encouraged heart healthy diet, increase exercise, avoid trans fats, consider a krill oil cap daily 

## 2018-11-29 ENCOUNTER — Other Ambulatory Visit: Payer: Self-pay | Admitting: *Deleted

## 2018-11-29 DIAGNOSIS — E785 Hyperlipidemia, unspecified: Secondary | ICD-10-CM

## 2018-11-29 MED ORDER — ROSUVASTATIN CALCIUM 5 MG PO TABS: 5.0000 mg | ORAL_TABLET | Freq: Every day | ORAL | 1 refills | Status: DC

## 2019-02-28 ENCOUNTER — Other Ambulatory Visit: Payer: 59

## 2019-05-18 ENCOUNTER — Other Ambulatory Visit: Payer: Self-pay | Admitting: Family Medicine

## 2019-05-18 DIAGNOSIS — F418 Other specified anxiety disorders: Secondary | ICD-10-CM

## 2019-05-18 DIAGNOSIS — Z862 Personal history of diseases of the blood and blood-forming organs and certain disorders involving the immune mechanism: Secondary | ICD-10-CM

## 2019-05-29 ENCOUNTER — Ambulatory Visit (INDEPENDENT_AMBULATORY_CARE_PROVIDER_SITE_OTHER): Payer: 59 | Admitting: Family Medicine

## 2019-05-29 ENCOUNTER — Encounter: Payer: Self-pay | Admitting: Family Medicine

## 2019-05-29 ENCOUNTER — Ambulatory Visit (HOSPITAL_BASED_OUTPATIENT_CLINIC_OR_DEPARTMENT_OTHER)
Admission: RE | Admit: 2019-05-29 | Discharge: 2019-05-29 | Disposition: A | Discharge status: Home / Self Care | Payer: 59 | Source: Ambulatory Visit | Attending: Family Medicine | Admitting: Family Medicine

## 2019-05-29 ENCOUNTER — Other Ambulatory Visit: Payer: Self-pay

## 2019-05-29 ENCOUNTER — Other Ambulatory Visit: Payer: Self-pay | Admitting: Family Medicine

## 2019-05-29 VITALS — BP 106/57 | HR 68 | Temp 98.5°F | Resp 18 | Ht 68.0 in | Wt 137.4 lb

## 2019-05-29 DIAGNOSIS — R05 Cough: Secondary | ICD-10-CM

## 2019-05-29 DIAGNOSIS — E785 Hyperlipidemia, unspecified: Secondary | ICD-10-CM

## 2019-05-29 DIAGNOSIS — R059 Cough, unspecified: Secondary | ICD-10-CM

## 2019-05-29 DIAGNOSIS — E2839 Other primary ovarian failure: Secondary | ICD-10-CM | POA: Diagnosis not present

## 2019-05-29 DIAGNOSIS — R300 Dysuria: Secondary | ICD-10-CM

## 2019-05-29 DIAGNOSIS — R9389 Abnormal findings on diagnostic imaging of other specified body structures: Secondary | ICD-10-CM

## 2019-05-29 LAB — CBC WITH DIFFERENTIAL/PLATELET
Basophils Absolute: 0.1 10*3/uL (ref 0.0–0.1)
Basophils Relative: 1.3 % (ref 0.0–3.0)
Eosinophils Absolute: 0.2 10*3/uL (ref 0.0–0.7)
Eosinophils Relative: 2 % (ref 0.0–5.0)
HCT: 40.4 % (ref 36.0–46.0)
Hemoglobin: 13.5 g/dL (ref 12.0–15.0)
Lymphocytes Relative: 26.1 % (ref 12.0–46.0)
Lymphs Abs: 2.1 10*3/uL (ref 0.7–4.0)
MCHC: 33.3 g/dL (ref 30.0–36.0)
MCV: 95.2 fl (ref 78.0–100.0)
Monocytes Absolute: 0.5 10*3/uL (ref 0.1–1.0)
Monocytes Relative: 6.2 % (ref 3.0–12.0)
Neutro Abs: 5.2 10*3/uL (ref 1.4–7.7)
Neutrophils Relative %: 64.4 % (ref 43.0–77.0)
Platelets: 295 10*3/uL (ref 150.0–400.0)
RBC: 4.24 Mil/uL (ref 3.87–5.11)
RDW: 13.4 % (ref 11.5–15.5)
WBC: 8.1 10*3/uL (ref 4.0–10.5)

## 2019-05-29 LAB — POC URINALSYSI DIPSTICK (AUTOMATED)
Bilirubin, UA: NEGATIVE
Blood, UA: NEGATIVE
Glucose, UA: NEGATIVE
Ketones, UA: NEGATIVE
Leukocytes, UA: NEGATIVE
Nitrite, UA: NEGATIVE
Protein, UA: POSITIVE — AB
Spec Grav, UA: 1.02 (ref 1.010–1.025)
Urobilinogen, UA: 0.2 E.U./dL
pH, UA: 6 (ref 5.0–8.0)

## 2019-05-29 LAB — COMPREHENSIVE METABOLIC PANEL
ALT: 16 U/L (ref 0–35)
AST: 19 U/L (ref 0–37)
Albumin: 4.4 g/dL (ref 3.5–5.2)
Alkaline Phosphatase: 89 U/L (ref 39–117)
BUN: 20 mg/dL (ref 6–23)
CO2: 30 mEq/L (ref 19–32)
Calcium: 9.6 mg/dL (ref 8.4–10.5)
Chloride: 105 mEq/L (ref 96–112)
Creatinine, Ser: 0.89 mg/dL (ref 0.40–1.20)
GFR: 62.65 mL/min (ref >60.00)
Glucose, Bld: 84 mg/dL (ref 70–99)
Potassium: 4.6 mEq/L (ref 3.5–5.1)
Sodium: 141 mEq/L (ref 135–145)
Total Bilirubin: 0.5 mg/dL (ref 0.2–1.2)
Total Protein: 6.8 g/dL (ref 6.0–8.3)

## 2019-05-29 LAB — LIPID PANEL
Cholesterol: 247 mg/dL — ABNORMAL HIGH (ref 0–200)
HDL: 43 mg/dL (ref >39.00)
NonHDL: 203.6
Total CHOL/HDL Ratio: 6
Triglycerides: 227 mg/dL — ABNORMAL HIGH (ref 0.0–149.0)
VLDL: 45.4 mg/dL — ABNORMAL HIGH (ref 0.0–40.0)

## 2019-05-29 LAB — LDL CHOLESTEROL, DIRECT: Direct LDL: 169 mg/dL

## 2019-05-29 NOTE — Progress Notes (Signed)
Subjective:     Donna Tapia is a 68 y.o. female and is here for a comprehensive physical exam. The patient reports no problems.  Social History   Socioeconomic History  . Marital status: Single    Spouse name: Not on file  . Number of children: Not on file  . Years of education: Not on file  . Highest education level: Not on file  Occupational History  . Occupation: retired  Engineer, productionocial Needs  . Financial resource strain: Not on file  . Food insecurity    Worry: Not on file    Inability: Not on file  . Transportation needs    Medical: Not on file    Non-medical: Not on file  Tobacco Use  . Smoking status: Never Smoker  . Smokeless tobacco: Never Used  Substance and Sexual Activity  . Alcohol use: No  . Drug use: No  . Sexual activity: Never  Lifestyle  . Physical activity    Days per week: Not on file    Minutes per session: Not on file  . Stress: Not on file  Relationships  . Social Musicianconnections    Talks on phone: Not on file    Gets together: Not on file    Attends religious service: Not on file    Active member of club or organization: Not on file    Attends meetings of clubs or organizations: Not on file    Relationship status: Not on file  . Intimate partner violence    Fear of current or ex partner: Not on file    Emotionally abused: Not on file    Physically abused: Not on file    Forced sexual activity: Not on file  Other Topics Concern  . Not on file  Social History Narrative   Exercise- walks   Health Maintenance  Topic Date Due  . Janet BerlinETANUS/TDAP  02/13/1968  . MAMMOGRAM  07/28/2017  . INFLUENZA VACCINE  06/02/2019  . DEXA SCAN  07/09/2019  . COLONOSCOPY  06/30/2025  . Hepatitis C Screening  Completed  . PNA vac Low Risk Adult  Completed    The following portions of the patient's history were reviewed and updated as appropriate:  She  has a past medical history of Anemia, Chicken pox, Environmental allergies, Hepatitis B (1978), and  Hyperlipidemia. She does not have any pertinent problems on file. She  has a past surgical history that includes Abdominal hysterectomy; Evisceration (01/07/2012); TEE without cardioversion (01/10/2012); Lumbar laminectomy/decompression microdiscectomy (01/04/2012); and Appendectomy. Her family history includes Colon cancer in her father and mother; Coronary artery disease in her mother; Heart disease in her unknown relative; Hyperlipidemia in her paternal aunt and paternal uncle; Hypertension in her unknown relative; Parkinsonism in her mother. She was adopted. She  reports that she has never smoked. She has never used smokeless tobacco. She reports that she does not drink alcohol or use drugs. She has a current medication list which includes the following prescription(s): acetaminophen, artificial tears, calcium carbonate, vitamin d3, coenzyme q10, folic acid, multivitamin with minerals, fish oil, rosuvastatin, and sertraline. Current Outpatient Medications on File Prior to Visit  Medication Sig Dispense Refill  . acetaminophen (TYLENOL) 325 MG tablet Take 325 mg by mouth every 4 (four) hours as needed. For pain     . artificial tears (LACRILUBE) OINT ophthalmic ointment Apply to eye 4 (four) times daily. 1 Tube 0  . calcium carbonate (OS-CAL - DOSED IN MG OF ELEMENTAL CALCIUM) 1250 (500 Ca) MG tablet  Take 1 tablet by mouth daily with breakfast.    . Cholecalciferol (VITAMIN D3) 2000 units TABS Take 1 tablet by mouth daily.    . CO ENZYME Q-10 PO Take 1 capsule by mouth daily.    . folic acid (FOLVITE) 161 MCG tablet Take 400 mcg by mouth 3 (three) times a week.     . Multiple Vitamin (MULITIVITAMIN WITH MINERALS) TABS Take 1 tablet by mouth daily.    . Omega-3 Fatty Acids (FISH OIL) 1000 MG CAPS Take 1 capsule by mouth daily.    . rosuvastatin (CRESTOR) 5 MG tablet Take 1 tablet (5 mg total) by mouth at bedtime. 30 tablet 1  . sertraline (ZOLOFT) 50 MG tablet TAKE 1.5 TABLETS BY MOUTH EVERY DAY 135  tablet 3   No current facility-administered medications on file prior to visit.    She is allergic to sulfur..  Review of Systems Review of Systems  Constitutional: Negative for activity change, appetite change and fatigue.  HENT: Negative for hearing loss, congestion, tinnitus and ear discharge.  dentist q35m Eyes: Negative for visual disturbance (see optho q1y -- vision corrected to 20/20 with glasses).  Respiratory: Negative for cough, chest tightness and shortness of breath.   Cardiovascular: Negative for chest pain, palpitations and leg swelling.  Gastrointestinal: Negative for abdominal pain, diarrhea, constipation and abdominal distention.  Genitourinary: Negative for urgency, frequency, decreased urine volume and difficulty urinating.  Musculoskeletal: Negative for back pain, arthralgias and gait problem.  Skin: Negative for color change, pallor and rash.  Neurological: Negative for dizziness, light-headedness, numbness and headaches.  Hematological: Negative for adenopathy. Does not bruise/bleed easily.  Psychiatric/Behavioral: Negative for suicidal ideas, confusion, sleep disturbance, self-injury, dysphoric mood, decreased concentration and agitation.       Objective:    BP (!) 106/57 (BP Location: Left Arm, Patient Position: Sitting, Cuff Size: Normal)   Pulse 68   Temp 98.5 F (36.9 C) (Oral)   Resp 18   Ht 5\' 8"  (1.727 m)   Wt 137 lb 6.4 oz (62.3 kg)   SpO2 99%   BMI 20.89 kg/m  General appearance: alert, cooperative, appears stated age and no distress Head: Normocephalic, without obvious abnormality, atraumatic Eyes: conjunctivae/corneas clear. PERRL, EOM's intact. Fundi benign. Ears: normal TM's and external ear canals both ears Nose: Nares normal. Septum midline. Mucosa normal. No drainage or sinus tenderness. Throat: lips, mucosa, and tongue normal; teeth and gums normal Neck: no adenopathy, no carotid bruit, no JVD, supple, symmetrical, trachea midline and  thyroid not enlarged, symmetric, no tenderness/mass/nodules Back: symmetric, no curvature. ROM normal. No CVA tenderness. Lungs: clear to auscultation bilaterally Breasts: normal appearance, no masses or tenderness Heart: regular rate and rhythm, S1, S2 normal, no murmur, click, rub or gallop Abdomen: soft, non-tender; bowel sounds normal; no masses,  no organomegaly Pelvic: cervix normal in appearance, external genitalia normal, no adnexal masses or tenderness, no cervical motion tenderness, rectovaginal septum normal, uterus normal size, shape, and consistency and vagina normal without discharge Extremities: extremities normal, atraumatic, no cyanosis or edema Pulses: 2+ and symmetric Skin: Skin color, texture, turgor normal. No rashes or lesions Lymph nodes: Cervical, supraclavicular, and axillary nodes normal. Neurologic: Alert and oriented X 3, normal strength and tone. Normal symmetric reflexes. Normal coordination and gait  Assessment:    Healthy female exam.      Plan:  ghm utd Check labs    See After Visit Summary for Counseling Recommendations

## 2019-05-29 NOTE — Patient Instructions (Signed)
Preventive Care 38 Years and Older, Female Preventive care refers to lifestyle choices and visits with your health care provider that can promote health and wellness. This includes:  A yearly physical exam. This is also called an annual well check.  Regular dental and eye exams.  Immunizations.  Screening for certain conditions.  Healthy lifestyle choices, such as diet and exercise. What can I expect for my preventive care visit? Physical exam Your health care provider will check:  Height and weight. These may be used to calculate body mass index (BMI), which is a measurement that tells if you are at a healthy weight.  Heart rate and blood pressure.  Your skin for abnormal spots. Counseling Your health care provider may ask you questions about:  Alcohol, tobacco, and drug use.  Emotional well-being.  Home and relationship well-being.  Sexual activity.  Eating habits.  History of falls.  Memory and ability to understand (cognition).  Work and work Statistician.  Pregnancy and menstrual history. What immunizations do I need?  Influenza (flu) vaccine  This is recommended every year. Tetanus, diphtheria, and pertussis (Tdap) vaccine  You may need a Td booster every 10 years. Varicella (chickenpox) vaccine  You may need this vaccine if you have not already been vaccinated. Zoster (shingles) vaccine  You may need this after age 33. Pneumococcal conjugate (PCV13) vaccine  One dose is recommended after age 33. Pneumococcal polysaccharide (PPSV23) vaccine  One dose is recommended after age 72. Measles, mumps, and rubella (MMR) vaccine  You may need at least one dose of MMR if you were born in 1957 or later. You may also need a second dose. Meningococcal conjugate (MenACWY) vaccine  You may need this if you have certain conditions. Hepatitis A vaccine  You may need this if you have certain conditions or if you travel or work in places where you may be exposed  to hepatitis A. Hepatitis B vaccine  You may need this if you have certain conditions or if you travel or work in places where you may be exposed to hepatitis B. Haemophilus influenzae type b (Hib) vaccine  You may need this if you have certain conditions. You may receive vaccines as individual doses or as more than one vaccine together in one shot (combination vaccines). Talk with your health care provider about the risks and benefits of combination vaccines. What tests do I need? Blood tests  Lipid and cholesterol levels. These may be checked every 5 years, or more frequently depending on your overall health.  Hepatitis C test.  Hepatitis B test. Screening  Lung cancer screening. You may have this screening every year starting at age 39 if you have a 30-pack-year history of smoking and currently smoke or have quit within the past 15 years.  Colorectal cancer screening. All adults should have this screening starting at age 36 and continuing until age 15. Your health care provider may recommend screening at age 23 if you are at increased risk. You will have tests every 1-10 years, depending on your results and the type of screening test.  Diabetes screening. This is done by checking your blood sugar (glucose) after you have not eaten for a while (fasting). You may have this done every 1-3 years.  Mammogram. This may be done every 1-2 years. Talk with your health care provider about how often you should have regular mammograms.  BRCA-related cancer screening. This may be done if you have a family history of breast, ovarian, tubal, or peritoneal cancers.  Other tests  Sexually transmitted disease (STD) testing.  Bone density scan. This is done to screen for osteoporosis. You may have this done starting at age 55. Follow these instructions at home: Eating and drinking  Eat a diet that includes fresh fruits and vegetables, whole grains, lean protein, and low-fat dairy products. Limit  your intake of foods with high amounts of sugar, saturated fats, and salt.  Take vitamin and mineral supplements as recommended by your health care provider.  Do not drink alcohol if your health care provider tells you not to drink.  If you drink alcohol: ? Limit how much you have to 0-1 drink a day. ? Be aware of how much alcohol is in your drink. In the [place] one drink equals one 12 oz bottle of beer (355 mL), one 5 oz glass of wine (148 mL), or one 1 oz glass of hard liquor (44 mL). Lifestyle  Take daily care of your teeth and gums.  Stay active. Exercise for at least 30 minutes on 5 or more days each week.  Do not use any products that contain nicotine or tobacco, such as cigarettes, e-cigarettes, and chewing tobacco. If you need help quitting, ask your health care provider.  If you are sexually active, practice safe sex. Use a condom or other form of protection in order to prevent STIs (sexually transmitted infections).  Talk with your health care provider about taking a low-dose aspirin or statin. What's next?  Go to your health care provider once a year for a well check visit.  Ask your health care provider how often you should have your eyes and teeth checked.  Stay up to date on all vaccines. This information is not intended to replace advice given to you by your health care provider. Make sure you discuss any questions you have with your health care provider. Document Released: 11/14/2015 Document Revised: 10/12/2018 Document Reviewed: 10/12/2018 Elsevier Patient Education  2020 [employer].

## 2019-05-30 ENCOUNTER — Other Ambulatory Visit: Payer: Self-pay

## 2019-05-30 ENCOUNTER — Other Ambulatory Visit: Payer: Self-pay | Admitting: Family Medicine

## 2019-05-30 DIAGNOSIS — E785 Hyperlipidemia, unspecified: Secondary | ICD-10-CM

## 2019-05-30 MED ORDER — ROSUVASTATIN CALCIUM 10 MG PO TABS: 10.0000 mg | ORAL_TABLET | Freq: Every day | ORAL | 2 refills | Status: DC

## 2019-06-06 ENCOUNTER — Other Ambulatory Visit: Payer: Self-pay | Admitting: Family Medicine

## 2019-06-06 DIAGNOSIS — Z1231 Encounter for screening mammogram for malignant neoplasm of breast: Secondary | ICD-10-CM

## 2019-06-07 ENCOUNTER — Ambulatory Visit (HOSPITAL_BASED_OUTPATIENT_CLINIC_OR_DEPARTMENT_OTHER)
Admission: RE | Admit: 2019-06-07 | Discharge: 2019-06-07 | Disposition: A | Discharge status: Home / Self Care | Payer: 59 | Source: Ambulatory Visit | Attending: Family Medicine | Admitting: Family Medicine

## 2019-06-07 ENCOUNTER — Other Ambulatory Visit: Payer: Self-pay

## 2019-06-07 ENCOUNTER — Other Ambulatory Visit: Payer: Self-pay | Admitting: Family Medicine

## 2019-06-07 DIAGNOSIS — R05 Cough: Secondary | ICD-10-CM | POA: Insufficient documentation

## 2019-06-07 DIAGNOSIS — A310 Pulmonary mycobacterial infection: Secondary | ICD-10-CM

## 2019-06-07 DIAGNOSIS — R9389 Abnormal findings on diagnostic imaging of other specified body structures: Secondary | ICD-10-CM | POA: Diagnosis present

## 2019-06-07 DIAGNOSIS — R059 Cough, unspecified: Secondary | ICD-10-CM

## 2019-06-07 MED ORDER — SULFAMETHOXAZOLE-TRIMETHOPRIM 800-160 MG PO TABS: 1.0000 | ORAL_TABLET | Freq: Two times a day (BID) | ORAL | 0 refills | Status: DC

## 2019-06-08 ENCOUNTER — Telehealth: Payer: Self-pay | Admitting: Family Medicine

## 2019-06-08 ENCOUNTER — Other Ambulatory Visit: Payer: Self-pay | Admitting: Family Medicine

## 2019-06-08 MED ORDER — CLARITHROMYCIN ER 500 MG PO TB24: 1000.0000 mg | ORAL_TABLET | Freq: Every day | ORAL | 0 refills | Status: DC

## 2019-06-08 NOTE — Telephone Encounter (Signed)
Changed to biaxin

## 2019-06-08 NOTE — Telephone Encounter (Signed)
Please advise 

## 2019-06-08 NOTE — Telephone Encounter (Signed)
Caller name: Roselyn Reef from  [id number] - [address], [place] [phone] (Phone) [phone] (Fax)      Reason for call: patient prescribed sulfamethoxazole-trimethoprim (BACTRIM DS) 800-160 MG tablet and as per patient she has a sulfa allergy, please advise

## 2019-08-20 ENCOUNTER — Ambulatory Visit
Admission: RE | Admit: 2019-08-20 | Discharge: 2019-08-20 | Disposition: A | Discharge status: Home / Self Care | Payer: 59 | Source: Ambulatory Visit | Attending: Family Medicine | Admitting: Family Medicine

## 2019-08-20 ENCOUNTER — Other Ambulatory Visit: Payer: Self-pay

## 2019-08-20 DIAGNOSIS — E2839 Other primary ovarian failure: Secondary | ICD-10-CM

## 2019-08-20 DIAGNOSIS — Z1231 Encounter for screening mammogram for malignant neoplasm of breast: Secondary | ICD-10-CM

## 2019-10-09 ENCOUNTER — Ambulatory Visit: Payer: 59 | Admitting: Emergency Medicine

## 2019-11-27 ENCOUNTER — Ambulatory Visit: Payer: 59 | Admitting: Family Medicine

## 2019-11-29 NOTE — Progress Notes (Signed)
Virtual Visit via Audio Note  I connected with patient on 11/30/19 at 11:45 AM EST by audio enabled telemedicine application and verified that I am speaking with the correct person using two identifiers.   THIS ENCOUNTER IS A VIRTUAL VISIT DUE TO COVID-19 - PATIENT WAS NOT SEEN IN THE OFFICE. PATIENT HAS CONSENTED TO VIRTUAL VISIT / TELEMEDICINE VISIT   Location of patient: home  Location of provider: office  I discussed the limitations of evaluation and management by telemedicine and the availability of in person appointments. The patient expressed understanding and agreed to proceed.   Subjective:   Donna Tapia is a 69 y.o. female who presents for Medicare Annual (Subsequent) preventive examination.  Review of Systems:  Home Safety/Smoke Alarms: Feels safe in home. Smoke alarms in place.  Lives alone w/ dog in senior community. 1 story. Has a good friend circle.    Female:        Mammo- 08/20/19      Dexa scan- 08/20/19 [facility]- 07/01/15.     Objective:     Vitals: Unable to assess. This visit is enabled though telemedicine due to Covid 19.   Advanced Directives 11/30/2019 05/11/2016 12/31/2011  Does Patient Have a Medical Advance Directive? No Yes Patient has advance directive, copy not in chart  Type of Advance Directive - Healthcare Power of [place];Living will Healthcare Power of [place];Living will  Does patient want to make changes to medical advance directive? - No - Patient declined -  Copy of Healthcare Power of Attorney in Chart? - No - copy requested -  Would patient like information on creating a medical advance directive? No - Patient declined - -    Tobacco Social History   Tobacco Use  Smoking Status Never Smoker  Smokeless Tobacco Never Used     Counseling given: Not Answered   Clinical Intake: Pain : No/denies pain     Past Medical History:  Diagnosis Date   Anemia    Chicken pox    Environmental allergies    Hepatitis B 1978    Hyperlipidemia    Past Surgical History:  Procedure Laterality Date   ABDOMINAL HYSTERECTOMY     APPENDECTOMY     EVISCERATION  01/07/2012   Procedure: EVISCERATION REPAIR;  Surgeon: Antony Contras, MD;  Location: [facility] OR;  Service: Ophthalmology;  Laterality: Left;  Evisceration and removal of contents left eye   LUMBAR LAMINECTOMY/DECOMPRESSION MICRODISCECTOMY  01/04/2012   Procedure: LUMBAR LAMINECTOMY/DECOMPRESSION MICRODISCECTOMY 1 LEVEL;  Surgeon: Reinaldo Meeker, MD;  Location: [facility] OR[facility];  Service: Neurosurgery;  Laterality: N/A;  Lumbar Laminectomy for Epidural Abscess   TEE WITHOUT CARDIOVERSION  01/10/2012   Procedure: TRANSESOPHAGEAL ECHOCARDIOGRAM (TEE);  Surgeon: Pricilla Riffle, MD;  Location: [facility] ENDOSCOPY;  Service: Cardiovascular;  Laterality: N/A;   Family History  Adopted: Yes  Problem Relation Age of Onset   Coronary artery disease Mother        Adopted mother   Parkinsonism Mother        Adopted Mother   Colon cancer Mother    Colon cancer Father    Hyperlipidemia Paternal Aunt    Hyperlipidemia Paternal Uncle    Heart disease Other        Both families   Hypertension Other    Social History   Socioeconomic History   Marital status: Single    Spouse name: Not on file   Number of children: Not on file   Years of education: Not on file  Highest education level: Not on file  Occupational History   Occupation: retired  Tobacco Use   Smoking status: Never Smoker   Smokeless tobacco: Never Used  Substance and Sexual Activity   Alcohol use: No   Drug use: No   Sexual activity: Never  Other Topics Concern   Not on file  Social History Narrative   Exercise- walks   Social Determinants of Health   Financial Resource Strain: Low Risk    Difficulty of Paying Living Expenses: Not hard at all  Food Insecurity: No Food Insecurity   Worried About Programme researcher, broadcasting/film/video in the Last Year: Never true   Ran Out of Food in the Last Year: Never  true  Transportation Needs: No Transportation Needs   Lack of Transportation (Medical): No   Lack of Transportation (Non-Medical): No  Physical Activity:    Days of Exercise per Week: Not on file   Minutes of Exercise per Session: Not on file  Stress:    Feeling of Stress : Not on file  Social Connections:    Frequency of Communication with Friends and Family: Not on file   Frequency of Social Gatherings with Friends and Family: Not on file   Attends Religious Services: Not on file   Active Member of Clubs or Organizations: Not on file   Attends Banker Meetings: Not on file   Marital Status: Not on file    Outpatient Encounter Medications as of 11/30/2019  Medication Sig   acetaminophen (TYLENOL) 325 MG tablet Take 325 mg by mouth every 4 (four) hours as needed. For pain    artificial tears (LACRILUBE) OINT ophthalmic ointment Apply to eye 4 (four) times daily.   calcium carbonate (OS-CAL - DOSED IN MG OF ELEMENTAL CALCIUM) 1250 (500 Ca) MG tablet Take 1 tablet by mouth daily with breakfast.   Cholecalciferol (VITAMIN D3) 2000 units TABS Take 1 tablet by mouth daily.   CO ENZYME Q-10 PO Take 1 capsule by mouth daily.   folic acid (FOLVITE) 400 MCG tablet Take 400 mcg by mouth 3 (three) times a week.    Multiple Vitamin (MULITIVITAMIN WITH MINERALS) TABS Take 1 tablet by mouth daily.   Omega-3 Fatty Acids (FISH OIL) 1000 MG CAPS Take 1 capsule by mouth daily.   sertraline (ZOLOFT) 50 MG tablet TAKE 1.5 TABLETS BY MOUTH EVERY DAY   rosuvastatin (CRESTOR) 10 MG tablet Take 1 tablet (10 mg total) by mouth daily. (Patient not taking: Reported on 11/30/2019)   [DISCONTINUED] clarithromycin (BIAXIN XL) 500 MG 24 hr tablet Take 2 tablets (1,000 mg total) by mouth daily.   No facility-administered encounter medications on file as of 11/30/2019.    Activities of Daily Living In your present state of health, do you have any difficulty performing the following  activities: 11/30/2019  Hearing? N  Vision? N  Difficulty concentrating or making decisions? N  Walking or climbing stairs? N  Dressing or bathing? N  Doing errands, shopping? N  Preparing Food and eating ? N  Using the Toilet? N  In the past six months, have you accidently leaked urine? N  Do you have problems with loss of bowel control? N  Managing your Medications? N  Managing your Finances? N  Housekeeping or managing your Housekeeping? N  Some recent data might be hidden    Patient Care Team: Zola Button, Grayling Congress, DO as PCP - General (Family Medicine) Antony Contras, MD as Consulting Physician (Ophthalmology) Leslye Peer, MD as  Consulting Physician (Pulmonary Disease)    Assessment:   This is a routine wellness examination for Renda. Physical assessment deferred to PCP.  Exercise Activities and Dietary recommendations Current Exercise Habits: Home exercise routine, Type of exercise: walking, Time (Minutes): 30, Frequency (Times/Week): 5, Weekly Exercise (Minutes/Week): 150, Intensity: Mild, Exercise limited by: None identified Diet (meal preparation, eat out, water intake, caffeinated beverages, dairy products, fruits and vegetables): in general, a "healthy" diet  , well balanced, on average, 3 meals per day    Goals     DIET - INCREASE WATER INTAKE     Increase physical activity     Continue to exercise        Fall Risk Fall Risk  11/30/2019 05/17/2017 05/11/2016 04/29/2015 04/29/2015  Falls in the past year? 0 No No No No  Number falls in past yr: 0 - - - -  Injury with Fall? 0 - - - -  Follow up Education provided;Falls prevention discussed - - - -   Depression Screen PHQ 2/9 Scores 11/30/2019 11/23/2018 11/15/2017 07/10/2017  PHQ - 2 Score 0 0 0 0  PHQ- 9 Score - 1 0 0     Cognitive Function Ad8 score reviewed for issues:  Issues making decisions:no  Less interest in hobbies / activities:no  Repeats questions, stories (family complaining):no  Trouble  using ordinary gadgets (microwave, computer, phone):no  Forgets the month or year: no  Mismanaging finances: no  Remembering appts:no  Daily problems with thinking and/or memory:no Ad8 score is=0       Immunization History  Administered Date(s) Administered   Influenza, High Dose Seasonal PF 07/21/2017   PPD Test 12/30/2011   Pneumococcal Conjugate-13 04/15/2014   Pneumococcal Polysaccharide-23 11/02/2011, 05/23/2018    Screening Tests Health Maintenance  Topic Date Due   TETANUS/TDAP  02/13/1968   INFLUENZA VACCINE  06/02/2019   MAMMOGRAM  08/19/2020   DEXA SCAN  08/19/2021   COLONOSCOPY  06/30/2025   Hepatitis C Screening  Completed   PNA vac Low Risk Adult  Completed      Plan:   See you next year!  Continue to eat heart healthy diet (full of fruits, vegetables, whole grains, lean protein, water--limit salt, fat, and sugar intake) and increase physical activity as tolerated.  Continue doing brain stimulating activities (puzzles, reading, adult coloring books, staying active) to keep memory sharp.     I have personally reviewed and noted the following in the patients chart:    Medical and social history  Use of alcohol, tobacco or illicit drugs   Current medications and supplements  Functional ability and status  Nutritional status  Physical activity  Advanced directives  List of other physicians  Hospitalizations, surgeries, and ER visits in previous 12 months  Vitals  Screenings to include cognitive, depression, and falls  Referrals and appointments  In addition, I have reviewed and discussed with patient certain preventive protocols, quality metrics, and best practice recommendations. A written personalized care plan for preventive services as well as general preventive health recommendations were provided to patient.     Shela Nevin, [place]  11/30/2019

## 2019-11-30 ENCOUNTER — Encounter: Payer: Self-pay | Admitting: *Deleted

## 2019-11-30 ENCOUNTER — Ambulatory Visit (INDEPENDENT_AMBULATORY_CARE_PROVIDER_SITE_OTHER): Payer: 59 | Admitting: *Deleted

## 2019-11-30 ENCOUNTER — Other Ambulatory Visit: Payer: Self-pay

## 2019-11-30 DIAGNOSIS — Z0000 Encounter for general adult medical examination without abnormal findings: Secondary | ICD-10-CM | POA: Diagnosis not present

## 2019-11-30 NOTE — Patient Instructions (Signed)
See you next year!  Continue to eat heart healthy diet (full of fruits, vegetables, whole grains, lean protein, water--limit salt, fat, and sugar intake) and increase physical activity as tolerated.  Continue doing brain stimulating activities (puzzles, reading, adult coloring books, staying active) to keep memory sharp.    Donna Tapia , Thank you for taking time to come for your Medicare Wellness Visit. I appreciate your ongoing commitment to your health goals. Please review the following plan we discussed and let me know if I can assist you in the future.   These are the goals we discussed: Goals    . DIET - INCREASE WATER INTAKE    . Increase physical activity     Continue to exercise        This is a list of the screening recommended for you and due dates:  Health Maintenance  Topic Date Due  . Tetanus Vaccine  02/13/1968  . Flu Shot  06/02/2019  . Mammogram  08/19/2020  . DEXA scan (bone density measurement)  08/19/2021  . Colon Cancer Screening  06/30/2025  .  Hepatitis C: One time screening is recommended by Center for Disease Control  (CDC) for  adults born from [birth year] through 1967.   Completed  . Pneumonia vaccines  Completed    Preventive Care 31 Years and Older, Female Preventive care refers to lifestyle choices and visits with your health care provider that can promote health and wellness. This includes:  A yearly physical exam. This is also called an annual well check.  Regular dental and eye exams.  Immunizations.  Screening for certain conditions.  Healthy lifestyle choices, such as diet and exercise. What can I expect for my preventive care visit? Physical exam Your health care provider will check:  Height and weight. These may be used to calculate body mass index (BMI), which is a measurement that tells if you are at a healthy weight.  Heart rate and blood pressure.  Your skin for abnormal spots. Counseling Your health care provider may ask you  questions about:  Alcohol, tobacco, and drug use.  Emotional well-being.  Home and relationship well-being.  Sexual activity.  Eating habits.  History of falls.  Memory and ability to understand (cognition).  Work and work Statistician.  Pregnancy and menstrual history. What immunizations do I need?  Influenza (flu) vaccine  This is recommended every year. Tetanus, diphtheria, and pertussis (Tdap) vaccine  You may need a Td booster every 10 years. Varicella (chickenpox) vaccine  You may need this vaccine if you have not already been vaccinated. Zoster (shingles) vaccine  You may need this after age 63. Pneumococcal conjugate (PCV13) vaccine  One dose is recommended after age 44. Pneumococcal polysaccharide (PPSV23) vaccine  One dose is recommended after age 54. Measles, mumps, and rubella (MMR) vaccine  You may need at least one dose of MMR if you were born in 1957 or later. You may also need a second dose. Meningococcal conjugate (MenACWY) vaccine  You may need this if you have certain conditions. Hepatitis A vaccine  You may need this if you have certain conditions or if you travel or work in places where you may be exposed to hepatitis A. Hepatitis B vaccine  You may need this if you have certain conditions or if you travel or work in places where you may be exposed to hepatitis B. Haemophilus influenzae type b (Hib) vaccine  You may need this if you have certain conditions. You may receive vaccines as  individual doses or as more than one vaccine together in one shot (combination vaccines). Talk with your health care provider about the risks and benefits of combination vaccines. What tests do I need? Blood tests  Lipid and cholesterol levels. These may be checked every 5 years, or more frequently depending on your overall health.  Hepatitis C test.  Hepatitis B test. Screening  Lung cancer screening. You may have this screening every year starting at  age 34 if you have a 30-pack-year history of smoking and currently smoke or have quit within the past 15 years.  Colorectal cancer screening. All adults should have this screening starting at age 39 and continuing until age 94. Your health care provider may recommend screening at age 63 if you are at increased risk. You will have tests every 1-10 years, depending on your results and the type of screening test.  Diabetes screening. This is done by checking your blood sugar (glucose) after you have not eaten for a while (fasting). You may have this done every 1-3 years.  Mammogram. This may be done every 1-2 years. Talk with your health care provider about how often you should have regular mammograms.  BRCA-related cancer screening. This may be done if you have a family history of breast, ovarian, tubal, or peritoneal cancers. Other tests  Sexually transmitted disease (STD) testing.  Bone density scan. This is done to screen for osteoporosis. You may have this done starting at age 69. Follow these instructions at home: Eating and drinking  Eat a diet that includes fresh fruits and vegetables, whole grains, lean protein, and low-fat dairy products. Limit your intake of foods with high amounts of sugar, saturated fats, and salt.  Take vitamin and mineral supplements as recommended by your health care provider.  Do not drink alcohol if your health care provider tells you not to drink.  If you drink alcohol: ? Limit how much you have to 0-1 drink a day. ? Be aware of how much alcohol is in your drink. In the [place] one drink equals one 12 oz bottle of beer (355 mL), one 5 oz glass of wine (148 mL), or one 1 oz glass of hard liquor (44 mL). Lifestyle  Take daily care of your teeth and gums.  Stay active. Exercise for at least 30 minutes on 5 or more days each week.  Do not use any products that contain nicotine or tobacco, such as cigarettes, e-cigarettes, and chewing tobacco. If you need  help quitting, ask your health care provider.  If you are sexually active, practice safe sex. Use a condom or other form of protection in order to prevent STIs (sexually transmitted infections).  Talk with your health care provider about taking a low-dose aspirin or statin. What's next?  Go to your health care provider once a year for a well check visit.  Ask your health care provider how often you should have your eyes and teeth checked.  Stay up to date on all vaccines. This information is not intended to replace advice given to you by your health care provider. Make sure you discuss any questions you have with your health care provider. Document Revised: 10/12/2018 Document Reviewed: 10/12/2018 Elsevier Patient Education  2020 [employer].

## 2019-12-10 ENCOUNTER — Ambulatory Visit: Payer: 59

## 2020-05-21 ENCOUNTER — Other Ambulatory Visit: Payer: Self-pay | Admitting: Family Medicine

## 2020-05-21 ENCOUNTER — Other Ambulatory Visit: Payer: Self-pay

## 2020-05-21 DIAGNOSIS — F418 Other specified anxiety disorders: Secondary | ICD-10-CM

## 2020-05-21 DIAGNOSIS — Z862 Personal history of diseases of the blood and blood-forming organs and certain disorders involving the immune mechanism: Secondary | ICD-10-CM

## 2020-05-21 MED ORDER — SERTRALINE HCL 50 MG PO TABS: ORAL_TABLET | ORAL | 3 refills | Status: DC

## 2020-06-03 ENCOUNTER — Ambulatory Visit (INDEPENDENT_AMBULATORY_CARE_PROVIDER_SITE_OTHER): Payer: 59 | Admitting: Family Medicine

## 2020-06-03 ENCOUNTER — Other Ambulatory Visit: Payer: Self-pay

## 2020-06-03 ENCOUNTER — Ambulatory Visit (HOSPITAL_BASED_OUTPATIENT_CLINIC_OR_DEPARTMENT_OTHER)
Admission: RE | Admit: 2020-06-03 | Discharge: 2020-06-03 | Disposition: A | Discharge status: Home / Self Care | Payer: 59 | Source: Ambulatory Visit | Attending: Family Medicine | Admitting: Family Medicine

## 2020-06-03 ENCOUNTER — Encounter: Payer: Self-pay | Admitting: Family Medicine

## 2020-06-03 VITALS — BP 104/70 | HR 69 | Temp 99.1°F | Resp 18 | Ht 68.0 in | Wt 138.4 lb

## 2020-06-03 DIAGNOSIS — Z0000 Encounter for general adult medical examination without abnormal findings: Secondary | ICD-10-CM

## 2020-06-03 DIAGNOSIS — R0600 Dyspnea, unspecified: Secondary | ICD-10-CM

## 2020-06-03 DIAGNOSIS — E785 Hyperlipidemia, unspecified: Secondary | ICD-10-CM | POA: Diagnosis not present

## 2020-06-03 DIAGNOSIS — Z8701 Personal history of pneumonia (recurrent): Secondary | ICD-10-CM | POA: Diagnosis not present

## 2020-06-03 DIAGNOSIS — R300 Dysuria: Secondary | ICD-10-CM

## 2020-06-03 DIAGNOSIS — Z1231 Encounter for screening mammogram for malignant neoplasm of breast: Secondary | ICD-10-CM

## 2020-06-03 DIAGNOSIS — G5602 Carpal tunnel syndrome, left upper limb: Secondary | ICD-10-CM | POA: Insufficient documentation

## 2020-06-03 LAB — POC URINALSYSI DIPSTICK (AUTOMATED)
Bilirubin, UA: NEGATIVE
Blood, UA: NEGATIVE
Glucose, UA: NEGATIVE
Ketones, UA: NEGATIVE
Leukocytes, UA: NEGATIVE
Nitrite, UA: NEGATIVE
Protein, UA: NEGATIVE
Spec Grav, UA: 1.025 (ref 1.010–1.025)
Urobilinogen, UA: 0.2 E.U./dL
pH, UA: 5.5 (ref 5.0–8.0)

## 2020-06-03 LAB — COMPREHENSIVE METABOLIC PANEL
ALT: 19 U/L (ref 0–35)
AST: 21 U/L (ref 0–37)
Albumin: 4.2 g/dL (ref 3.5–5.2)
Alkaline Phosphatase: 90 U/L (ref 39–117)
BUN: 21 mg/dL (ref 6–23)
CO2: 29 mEq/L (ref 19–32)
Calcium: 9.2 mg/dL (ref 8.4–10.5)
Chloride: 104 mEq/L (ref 96–112)
Creatinine, Ser: 0.82 mg/dL (ref 0.40–1.20)
GFR: 68.66 mL/min (ref >60.00)
Glucose, Bld: 83 mg/dL (ref 70–99)
Potassium: 4.6 mEq/L (ref 3.5–5.1)
Sodium: 139 mEq/L (ref 135–145)
Total Bilirubin: 0.5 mg/dL (ref 0.2–1.2)
Total Protein: 6.7 g/dL (ref 6.0–8.3)

## 2020-06-03 LAB — CBC WITH DIFFERENTIAL/PLATELET
Basophils Absolute: 0 10*3/uL (ref 0.0–0.1)
Basophils Relative: 0.6 % (ref 0.0–3.0)
Eosinophils Absolute: 0.2 10*3/uL (ref 0.0–0.7)
Eosinophils Relative: 3.1 % (ref 0.0–5.0)
HCT: 38.8 % (ref 36.0–46.0)
Hemoglobin: 13.1 g/dL (ref 12.0–15.0)
Lymphocytes Relative: 27.6 % (ref 12.0–46.0)
Lymphs Abs: 2.2 10*3/uL (ref 0.7–4.0)
MCHC: 33.7 g/dL (ref 30.0–36.0)
MCV: 93.7 fl (ref 78.0–100.0)
Monocytes Absolute: 0.6 10*3/uL (ref 0.1–1.0)
Monocytes Relative: 7.7 % (ref 3.0–12.0)
Neutro Abs: 4.8 10*3/uL (ref 1.4–7.7)
Neutrophils Relative %: 61 % (ref 43.0–77.0)
Platelets: 251 10*3/uL (ref 150.0–400.0)
RBC: 4.14 Mil/uL (ref 3.87–5.11)
RDW: 13.6 % (ref 11.5–15.5)
WBC: 7.8 10*3/uL (ref 4.0–10.5)

## 2020-06-03 LAB — LIPID PANEL
Cholesterol: 256 mg/dL — ABNORMAL HIGH (ref 0–200)
HDL: 42.1 mg/dL (ref >39.00)
LDL Cholesterol: 177 mg/dL — ABNORMAL HIGH (ref 0–99)
NonHDL: 213.97
Total CHOL/HDL Ratio: 6
Triglycerides: 185 mg/dL — ABNORMAL HIGH (ref 0.0–149.0)
VLDL: 37 mg/dL (ref 0.0–40.0)

## 2020-06-03 NOTE — Progress Notes (Signed)
Subjective:     Donna Tapia is a 69 y.o. female and is here for a comprehensive physical exam. The patient reports -- pain in L wrist / thumb at night -- splint helps   Social History   Socioeconomic History  . Marital status: Single    Spouse name: Not on file  . Number of children: Not on file  . Years of education: Not on file  . Highest education level: Not on file  Occupational History  . Occupation: retired  Tobacco Use  . Smoking status: Never Smoker  . Smokeless tobacco: Never Used  Substance and Sexual Activity  . Alcohol use: No  . Drug use: No  . Sexual activity: Never  Other Topics Concern  . Not on file  Social History Narrative   Exercise- walks   Social Determinants of Health   Financial Resource Strain: Low Risk   . Difficulty of Paying Living Expenses: Not hard at all  Food Insecurity: No Food Insecurity  . Worried About Programme researcher, broadcasting/film/video in the Last Year: Never true  . Ran Out of Food in the Last Year: Never true  Transportation Needs: No Transportation Needs  . Lack of Transportation (Medical): No  . Lack of Transportation (Non-Medical): No  Physical Activity:   . Days of Exercise per Week:   . Minutes of Exercise per Session:   Stress:   . Feeling of Stress :   Social Connections:   . Frequency of Communication with Friends and Family:   . Frequency of Social Gatherings with Friends and Family:   . Attends Religious Services:   . Active Member of Clubs or Organizations:   . Attends Banker Meetings:   Marland Kitchen Marital Status:   Intimate Partner Violence:   . Fear of Current or Ex-Partner:   . Emotionally Abused:   Marland Kitchen Physically Abused:   . Sexually Abused:    Health Maintenance  Topic Date Due  . COVID-19 Vaccine (1) Never done  . INFLUENZA VACCINE  06/01/2020  . TETANUS/TDAP  06/03/2021 (Originally [birth year]1968)  . MAMMOGRAM  08/19/2020  . DEXA SCAN  08/19/2021  . COLONOSCOPY  06/30/2025  . Hepatitis C Screening  Completed  .  PNA vac Low Risk Adult  Completed    The following portions of the patient's history were reviewed and updated as appropriate:  She  has a past medical history of Anemia, Chicken pox, Environmental allergies, Hepatitis B (1978), and Hyperlipidemia. She does not have any pertinent problems on file. She  has a past surgical history that includes Abdominal hysterectomy; Evisceration (01/07/2012); TEE without cardioversion (01/10/2012); Lumbar laminectomy/decompression microdiscectomy (01/04/2012); and Appendectomy. Her family history includes Colon cancer in her father and mother; Coronary artery disease in her mother; Heart disease in an other family member; Hyperlipidemia in her paternal aunt and paternal uncle; Hypertension in an other family member; Parkinsonism in her mother. She was adopted. She  reports that she has never smoked. She has never used smokeless tobacco. She reports that she does not drink alcohol and does not use drugs. She has a current medication list which includes the following prescription(s): acetaminophen, artificial tears, calcium carbonate, vitamin d3, coenzyme q10, folic acid, multivitamin with minerals, fish oil, rosuvastatin, and sertraline. Current Outpatient Medications on File Prior to Visit  Medication Sig Dispense Refill  . acetaminophen (TYLENOL) 325 MG tablet Take 325 mg by mouth every 4 (four) hours as needed. For pain     . artificial tears (LACRILUBE) OINT  ophthalmic ointment Apply to eye 4 (four) times daily. 1 Tube 0  . calcium carbonate (OS-CAL - DOSED IN MG OF ELEMENTAL CALCIUM) 1250 (500 Ca) MG tablet Take 1 tablet by mouth daily with breakfast.    . Cholecalciferol (VITAMIN D3) 2000 units TABS Take 1 tablet by mouth daily.    . CO ENZYME Q-10 PO Take 1 capsule by mouth daily.    . folic acid (FOLVITE) 400 MCG tablet Take 400 mcg by mouth 3 (three) times a week.     . Multiple Vitamin (MULITIVITAMIN WITH MINERALS) TABS Take 1 tablet by mouth daily.    .  Omega-3 Fatty Acids (FISH OIL) 1000 MG CAPS Take 1 capsule by mouth daily.    . rosuvastatin (CRESTOR) 10 MG tablet Take 1 tablet (10 mg total) by mouth daily. 30 tablet 2  . sertraline (ZOLOFT) 50 MG tablet TAKE 1.5 TABLETS BY MOUTH EVERY DAY 135 tablet 3   No current facility-administered medications on file prior to visit.   She is allergic to sulfur.. . Review of Systems Review of Systems  Constitutional: Negative for activity change, appetite change and fatigue.  HENT: Negative for hearing loss, congestion, tinnitus and ear discharge.  dentist q21m Eyes: Negative for visual disturbance (see optho q1y -- vision corrected to 20/20 with glasses).  Respiratory: Negative for cough, chest tightness and shortness of breath.   Cardiovascular: Negative for chest pain, palpitations and leg swelling.  Gastrointestinal: Negative for abdominal pain, diarrhea, constipation and abdominal distention.  Genitourinary: Negative for urgency, frequency, decreased urine volume and difficulty urinating.  Musculoskeletal: Negative for back pain, arthralgias and gait problem.  Skin: Negative for color change, pallor and rash.  Neurological: Negative for dizziness, light-headedness, numbness and headaches.  Hematological: Negative for adenopathy. Does not bruise/bleed easily.  Psychiatric/Behavioral: Negative for suicidal ideas, confusion, sleep disturbance, self-injury, dysphoric mood, decreased concentration and agitation.       Objective:    BP 104/70 (BP Location: Left Arm, Patient Position: Sitting, Cuff Size: Normal)   Pulse 69   Temp 99.1 F (37.3 C) (Oral)   Resp 18   Ht 5\' 8"  (1.727 m)   Wt 138 lb 6.4 oz (62.8 kg)   SpO2 97%   BMI 21.04 kg/m  General appearance: alert, cooperative and no distress Head:  Eyes: negative findings: lids and lashes normal and conjunctivae and sclerae normal Ears: b/l cerumen impaction Nose: Nares normal. Septum midline. Mucosa normal. No drainage or sinus  tenderness. Throat: lips, mucosa, and tongue normal; teeth and gums normal Neck: no adenopathy, no carotid bruit, no JVD, supple, symmetrical, trachea midline and thyroid not enlarged, symmetric, no tenderness/mass/nodules Back: symmetric, no curvature. ROM normal. No CVA tenderness. Lungs: clear to auscultation bilaterally Breasts: normal appearance, no masses or tenderness Heart: regular rate and rhythm, S1, S2 normal, no murmur, click, rub or gallop Abdomen: soft, non-tender; bowel sounds normal; no masses,  no organomegaly Pelvic: not indicated; status post hysterectomy, negative ROS Extremities: extremities normal, atraumatic, no cyanosis or edema Pulses: 2+ and symmetric Skin: Skin color, texture, turgor normal. No rashes or lesions Lymph nodes: Cervical, supraclavicular, and axillary nodes normal. Neurologic: Alert and oriented X 3, normal strength and tone. Normal symmetric reflexes. Normal coordination and gait    Assessment:    Healthy female exam.      Plan:    ghm utd Check labs  See After Visit Summary for Counseling Recommendations    1. Dyspnea, unspecified type F/u pulmonary  - CT Chest Wo  Contrast; Future - Lipid panel - Comprehensive metabolic panel - CBC with Differential/Platelet  2. History of bacterial pneumonia F/u pulmonary Pt still has cough - CT Chest Wo Contrast; Future - CBC with Differential/Platelet  3. Dyslipidemia Encouraged heart healthy diet, increase exercise, avoid trans fats, consider a krill oil cap daily - Lipid panel - Comprehensive metabolic panel - CBC with Differential/Platelet  4. Preventative health care See above   5. Dysuria Pt mentioned this in the lab--- so urine added  - POCT Urinalysis Dipstick (Automated) - Urine Culture  6. Encounter for screening mammogram for malignant neoplasm of breast   - MM DIAG BREAST TOMO BILATERAL; Future

## 2020-06-03 NOTE — Patient Instructions (Signed)
Preventive Care 71 Years and Older, Female Preventive care refers to lifestyle choices and visits with your health care provider that can promote health and wellness. This includes:  A yearly physical exam. This is also called an annual well check.  Regular dental and eye exams.  Immunizations.  Screening for certain conditions.  Healthy lifestyle choices, such as diet and exercise. What can I expect for my preventive care visit? Physical exam Your health care provider will check:  Height and weight. These may be used to calculate body mass index (BMI), which is a measurement that tells if you are at a healthy weight.  Heart rate and blood pressure.  Your skin for abnormal spots. Counseling Your health care provider may ask you questions about:  Alcohol, tobacco, and drug use.  Emotional well-being.  Home and relationship well-being.  Sexual activity.  Eating habits.  History of falls.  Memory and ability to understand (cognition).  Work and work Statistician.  Pregnancy and menstrual history. What immunizations do I need?  Influenza (flu) vaccine  This is recommended every year. Tetanus, diphtheria, and pertussis (Tdap) vaccine  You may need a Td booster every 10 years. Varicella (chickenpox) vaccine  You may need this vaccine if you have not already been vaccinated. Zoster (shingles) vaccine  You may need this after age 71. Pneumococcal conjugate (PCV13) vaccine  One dose is recommended after age 71. Pneumococcal polysaccharide (PPSV23) vaccine  One dose is recommended after age 71. Measles, mumps, and rubella (MMR) vaccine  You may need at least one dose of MMR if you were born in 1957 or later. You may also need a second dose. Meningococcal conjugate (MenACWY) vaccine  You may need this if you have certain conditions. Hepatitis A vaccine  You may need this if you have certain conditions or if you travel or work in places where you may be exposed  to hepatitis A. Hepatitis B vaccine  You may need this if you have certain conditions or if you travel or work in places where you may be exposed to hepatitis B. Haemophilus influenzae type b (Hib) vaccine  You may need this if you have certain conditions. You may receive vaccines as individual doses or as more than one vaccine together in one shot (combination vaccines). Talk with your health care provider about the risks and benefits of combination vaccines. What tests do I need? Blood tests  Lipid and cholesterol levels. These may be checked every 5 years, or more frequently depending on your overall health.  Hepatitis C test.  Hepatitis B test. Screening  Lung cancer screening. You may have this screening every year starting at age 71 if you have a 30-pack-year history of smoking and currently smoke or have quit within the past 15 years.  Colorectal cancer screening. All adults should have this screening starting at age 71 and continuing until age 15. Your health care provider may recommend screening at age 23 if you are at increased risk. You will have tests every 1-10 years, depending on your results and the type of screening test.  Diabetes screening. This is done by checking your blood sugar (glucose) after you have not eaten for a while (fasting). You may have this done every 1-3 years.  Mammogram. This may be done every 1-2 years. Talk with your health care provider about how often you should have regular mammograms.  BRCA-related cancer screening. This may be done if you have a family history of breast, ovarian, tubal, or peritoneal cancers.  Other tests  Sexually transmitted disease (STD) testing.  Bone density scan. This is done to screen for osteoporosis. You may have this done starting at age 71. Follow these instructions at home: Eating and drinking  Eat a diet that includes fresh fruits and vegetables, whole grains, lean protein, and low-fat dairy products. Limit  your intake of foods with high amounts of sugar, saturated fats, and salt.  Take vitamin and mineral supplements as recommended by your health care provider.  Do not drink alcohol if your health care provider tells you not to drink.  If you drink alcohol: ? Limit how much you have to 0-1 drink a day. ? Be aware of how much alcohol is in your drink. In the [place] one drink equals one 12 oz bottle of beer (355 mL), one 5 oz glass of wine (148 mL), or one 1 oz glass of hard liquor (44 mL). Lifestyle  Take daily care of your teeth and gums.  Stay active. Exercise for at least 30 minutes on 5 or more days each week.  Do not use any products that contain nicotine or tobacco, such as cigarettes, e-cigarettes, and chewing tobacco. If you need help quitting, ask your health care provider.  If you are sexually active, practice safe sex. Use a condom or other form of protection in order to prevent STIs (sexually transmitted infections).  Talk with your health care provider about taking a low-dose aspirin or statin. What's next?  Go to your health care provider once a year for a well check visit.  Ask your health care provider how often you should have your eyes and teeth checked.  Stay up to date on all vaccines. This information is not intended to replace advice given to you by your health care provider. Make sure you discuss any questions you have with your health care provider. Document Revised: 10/12/2018 Document Reviewed: 10/12/2018 Elsevier Patient Education  2020 [employer].

## 2020-06-03 NOTE — Assessment & Plan Note (Signed)
con't splint Can use voltaren gel otc for probable arthritis as well Consider ortho if no better

## 2020-06-05 LAB — URINE CULTURE
MICRO NUMBER:: 10781178
SPECIMEN QUALITY:: ADEQUATE

## 2020-06-11 ENCOUNTER — Other Ambulatory Visit: Payer: Self-pay | Admitting: Family Medicine

## 2020-06-11 DIAGNOSIS — Z1231 Encounter for screening mammogram for malignant neoplasm of breast: Secondary | ICD-10-CM

## 2020-08-21 ENCOUNTER — Ambulatory Visit: Payer: 59

## 2020-09-16 ENCOUNTER — Other Ambulatory Visit: Payer: Self-pay

## 2020-09-16 ENCOUNTER — Ambulatory Visit
Admission: RE | Admit: 2020-09-16 | Discharge: 2020-09-16 | Disposition: A | Discharge status: Home / Self Care | Payer: 59 | Source: Ambulatory Visit | Attending: Family Medicine | Admitting: Family Medicine

## 2020-09-16 DIAGNOSIS — Z1231 Encounter for screening mammogram for malignant neoplasm of breast: Secondary | ICD-10-CM

## 2020-12-02 ENCOUNTER — Ambulatory Visit: Payer: Self-pay | Admitting: *Deleted

## 2020-12-15 ENCOUNTER — Ambulatory Visit: Payer: 59 | Admitting: Family Medicine

## 2021-04-30 ENCOUNTER — Telehealth: Payer: Self-pay | Admitting: Family Medicine

## 2021-04-30 NOTE — Telephone Encounter (Signed)
Copied from CRM [id number]. Topic: Medicare AWV >> Apr 30, 2021  9:15 AM Harris-Coley, Avon Gully wrote: Reason for CRM: Left message for patient to schedule Annual Wellness Visit.  Please schedule with Health Nurse Advisor Clare Gandy. at [facility]

## 2021-05-22 ENCOUNTER — Other Ambulatory Visit: Payer: Self-pay | Admitting: Family Medicine

## 2021-05-22 DIAGNOSIS — Z862 Personal history of diseases of the blood and blood-forming organs and certain disorders involving the immune mechanism: Secondary | ICD-10-CM

## 2021-05-22 DIAGNOSIS — F418 Other specified anxiety disorders: Secondary | ICD-10-CM

## 2021-05-22 NOTE — Telephone Encounter (Signed)
Patient checking the status of medication 

## 2021-06-09 ENCOUNTER — Ambulatory Visit (INDEPENDENT_AMBULATORY_CARE_PROVIDER_SITE_OTHER): Payer: 59 | Admitting: Family Medicine

## 2021-06-09 ENCOUNTER — Encounter: Payer: Self-pay | Admitting: Family Medicine

## 2021-06-09 ENCOUNTER — Ambulatory Visit (HOSPITAL_BASED_OUTPATIENT_CLINIC_OR_DEPARTMENT_OTHER)
Admission: RE | Admit: 2021-06-09 | Discharge: 2021-06-09 | Disposition: A | Discharge status: Home / Self Care | Payer: 59 | Source: Ambulatory Visit | Attending: Family Medicine | Admitting: Family Medicine

## 2021-06-09 ENCOUNTER — Other Ambulatory Visit: Payer: Self-pay

## 2021-06-09 VITALS — BP 110/60 | HR 73 | Temp 98.8°F | Resp 18 | Ht 68.0 in | Wt 138.4 lb

## 2021-06-09 DIAGNOSIS — M79641 Pain in right hand: Secondary | ICD-10-CM | POA: Insufficient documentation

## 2021-06-09 DIAGNOSIS — F418 Other specified anxiety disorders: Secondary | ICD-10-CM

## 2021-06-09 DIAGNOSIS — Z862 Personal history of diseases of the blood and blood-forming organs and certain disorders involving the immune mechanism: Secondary | ICD-10-CM

## 2021-06-09 DIAGNOSIS — E785 Hyperlipidemia, unspecified: Secondary | ICD-10-CM

## 2021-06-09 DIAGNOSIS — Z0000 Encounter for general adult medical examination without abnormal findings: Secondary | ICD-10-CM | POA: Diagnosis not present

## 2021-06-09 LAB — CBC WITH DIFFERENTIAL/PLATELET
Basophils Absolute: 0 10*3/uL (ref 0.0–0.1)
Basophils Relative: 0.7 % (ref 0.0–3.0)
Eosinophils Absolute: 0.1 10*3/uL (ref 0.0–0.7)
Eosinophils Relative: 2 % (ref 0.0–5.0)
HCT: 38.4 % (ref 36.0–46.0)
Hemoglobin: 13 g/dL (ref 12.0–15.0)
Lymphocytes Relative: 31 % (ref 12.0–46.0)
Lymphs Abs: 2 10*3/uL (ref 0.7–4.0)
MCHC: 33.7 g/dL (ref 30.0–36.0)
MCV: 93.5 fl (ref 78.0–100.0)
Monocytes Absolute: 0.4 10*3/uL (ref 0.1–1.0)
Monocytes Relative: 5.7 % (ref 3.0–12.0)
Neutro Abs: 3.9 10*3/uL (ref 1.4–7.7)
Neutrophils Relative %: 60.6 % (ref 43.0–77.0)
Platelets: 268 10*3/uL (ref 150.0–400.0)
RBC: 4.1 Mil/uL (ref 3.87–5.11)
RDW: 13.4 % (ref 11.5–15.5)
WBC: 6.5 10*3/uL (ref 4.0–10.5)

## 2021-06-09 LAB — COMPREHENSIVE METABOLIC PANEL
ALT: 19 U/L (ref 0–35)
AST: 21 U/L (ref 0–37)
Albumin: 4.2 g/dL (ref 3.5–5.2)
Alkaline Phosphatase: 84 U/L (ref 39–117)
BUN: 18 mg/dL (ref 6–23)
CO2: 29 mEq/L (ref 19–32)
Calcium: 9.5 mg/dL (ref 8.4–10.5)
Chloride: 101 mEq/L (ref 96–112)
Creatinine, Ser: 0.9 mg/dL (ref 0.40–1.20)
GFR: 63.95 mL/min (ref >60.00)
Glucose, Bld: 83 mg/dL (ref 70–99)
Potassium: 4.4 mEq/L (ref 3.5–5.1)
Sodium: 137 mEq/L (ref 135–145)
Total Bilirubin: 0.5 mg/dL (ref 0.2–1.2)
Total Protein: 6.9 g/dL (ref 6.0–8.3)

## 2021-06-09 LAB — LIPID PANEL
Cholesterol: 281 mg/dL — ABNORMAL HIGH (ref 0–200)
HDL: 45.6 mg/dL (ref >39.00)
NonHDL: 235.56
Total CHOL/HDL Ratio: 6
Triglycerides: 253 mg/dL — ABNORMAL HIGH (ref 0.0–149.0)
VLDL: 50.6 mg/dL — ABNORMAL HIGH (ref 0.0–40.0)

## 2021-06-09 LAB — LDL CHOLESTEROL, DIRECT: Direct LDL: 204 mg/dL

## 2021-06-09 MED ORDER — SERTRALINE HCL 50 MG PO TABS: ORAL_TABLET | ORAL | 3 refills | Status: DC

## 2021-06-09 NOTE — Progress Notes (Signed)
Subjective:   By signing my name below, I, [name], attest that this documentation has been prepared under the direction and in the presence of Dr. Seabron Spates, DO. 06/09/2021     Patient ID: Donna Tapia, female    DOB: [birth year], 70 y.o.   MRN: [id number]  Chief Complaint  Patient presents with   Annual Exam    Pt states fasting     HPI Patient is in today for a comprehensive physical exam.  She reports having pain in her right 3rd finger. She mentions at night she struggles moving her finger up and down and hears a popping sound while moving it. Soon after she stiffness developed lower into her knuckles. She finds relief when running warm water on her hand. She also notes her 3rd finger gets occasionally stuck and she struggles moving it. It does not bother her most of the time. She is willing to see a hand specialist to further evaluate her symptoms.  She continues taking 75 mg Zoloft daily PO and reports no new issues while taking it. She is requesting for a yearly refill for her Zoloft prescription.  She reports having Covid-19 in 12/2020 and reports having mild symptoms.  She denies having any fever, ear pain, congestion, sinus pain, sore throat, eye pain, chest pain, palpations, cough, SOB, wheezing, n/v/d, constipation, blood in stool, dysuria, frequency, hematuria, or headaches at this time. She has 4 Covid-19 vaccines at this time. She is due for a tetanus vaccine and is willing to get it at her pharmacy at a later date. She is also due for the shingles vaccine and is willing to get it at her pharmacy.  She participates in exercise 2-3x day by walking her dog. She notes she has decreased the frequency of her walks since the weather outside has gotten hotter.   Past Medical History:  Diagnosis Date   Anemia    Chicken pox    Environmental allergies    Hepatitis B 1978   Hyperlipidemia     Past Surgical History:  Procedure Laterality Date   ABDOMINAL  HYSTERECTOMY     APPENDECTOMY     EVISCERATION  01/07/2012   Procedure: EVISCERATION REPAIR;  Surgeon: Antony Contras, MD;  Location: [facility] OR;  Service: Ophthalmology;  Laterality: Left;  Evisceration and removal of contents left eye   LUMBAR LAMINECTOMY/DECOMPRESSION MICRODISCECTOMY  01/04/2012   Procedure: LUMBAR LAMINECTOMY/DECOMPRESSION MICRODISCECTOMY 1 LEVEL;  Surgeon: Reinaldo Meeker, MD;  Location: [facility] NEURO ORS;  Service: Neurosurgery;  Laterality: N/A;  Lumbar Laminectomy for Epidural Abscess   TEE WITHOUT CARDIOVERSION  01/10/2012   Procedure: TRANSESOPHAGEAL ECHOCARDIOGRAM (TEE);  Surgeon: Pricilla Riffle, MD;  Location: [facility] ENDOSCOPY;  Service: Cardiovascular;  Laterality: N/A;    Family History  Adopted: Yes  Problem Relation Age of Onset   Coronary artery disease Mother        Adopted mother   Parkinsonism Mother        Adopted Mother   Colon cancer Mother    Colon cancer Father    Hyperlipidemia Paternal Aunt    Hyperlipidemia Paternal Uncle    Heart disease Other        Both families   Hypertension Other     Social History   Socioeconomic History   Marital status: Single    Spouse name: Not on file   Number of children: Not on file   Years of education: Not on file   Highest education level: Not on file  Occupational History   Occupation: retired  Tobacco Use   Smoking status: Never   Smokeless tobacco: Never  Substance and Sexual Activity   Alcohol use: No   Drug use: No   Sexual activity: Never  Other Topics Concern   Not on file  Social History Narrative   Exercise- walks   Social Determinants of Health   Financial Resource Strain: Not on file  Food Insecurity: Not on file  Transportation Needs: Not on file  Physical Activity: Not on file  Stress: Not on file  Social Connections: Not on file  Intimate Partner Violence: Not on file    Outpatient Medications Prior to Visit  Medication Sig Dispense Refill   acetaminophen (TYLENOL) 325 MG tablet Take 325  mg by mouth every 4 (four) hours as needed. For pain      artificial tears (LACRILUBE) OINT ophthalmic ointment Apply to eye 4 (four) times daily. 1 Tube 0   calcium carbonate (OS-CAL - DOSED IN MG OF ELEMENTAL CALCIUM) 1250 (500 Ca) MG tablet Take 1 tablet by mouth daily with breakfast.     Cholecalciferol (VITAMIN D3) 2000 units TABS Take 1 tablet by mouth daily.     CO ENZYME Q-10 PO Take 1 capsule by mouth daily.     folic acid (FOLVITE) 400 MCG tablet Take 400 mcg by mouth 3 (three) times a week.      Multiple Vitamin (MULITIVITAMIN WITH MINERALS) TABS Take 1 tablet by mouth daily.     Omega-3 Fatty Acids (FISH OIL) 1000 MG CAPS Take 1 capsule by mouth daily.     sertraline (ZOLOFT) 50 MG tablet TAKE 1 AND 1/2 TABLETS BY MOUTH EVERY DAY 60 tablet 0   rosuvastatin (CRESTOR) 10 MG tablet Take 1 tablet (10 mg total) by mouth daily. (Patient not taking: Reported on 06/09/2021) 30 tablet 2   No facility-administered medications prior to visit.    Allergies  Allergen Reactions   Elemental Sulfur Anaphylaxis and Rash    Review of Systems  Constitutional:  Negative for fever.  HENT:  Negative for congestion, ear pain, sinus pain and sore throat.   Eyes:  Negative for pain.  Respiratory:  Negative for cough, shortness of breath and wheezing.   Cardiovascular:  Negative for chest pain and palpitations.  Gastrointestinal:  Negative for blood in stool, constipation, diarrhea, nausea and vomiting.  Genitourinary:  Negative for dysuria, frequency and hematuria.  Musculoskeletal:  Positive for joint pain (3rd finger pain and stiffness).  Neurological:  Negative for headaches.  Psychiatric/Behavioral:  Negative for depression. The patient is not nervous/anxious.       Objective:    Physical Exam Constitutional:      General: She is not in acute distress.    Appearance: Normal appearance. She is not ill-appearing.  HENT:     Head: Normocephalic and atraumatic.     Right Ear: Tympanic  membrane, ear canal and external ear normal.     Left Ear: Tympanic membrane, ear canal and external ear normal.  Eyes:     Extraocular Movements: Extraocular movements intact.     Pupils: Pupils are equal, round, and reactive to light.  Cardiovascular:     Rate and Rhythm: Normal rate and regular rhythm.     Heart sounds: Normal heart sounds. No murmur heard.   No gallop.  Pulmonary:     Effort: Pulmonary effort is normal. No respiratory distress.     Breath sounds: Normal breath sounds. No wheezing or rales.  Abdominal:  General: Bowel sounds are normal. There is no distension.     Palpations: Abdomen is soft. There is no mass.     Tenderness: There is no abdominal tenderness. There is no guarding or rebound.  Musculoskeletal:     Right hand: Tenderness and bony tenderness present. Normal range of motion.  Skin:    General: Skin is warm and dry.  Neurological:     Mental Status: She is alert and oriented to person, place, and time.  Psychiatric:        Behavior: Behavior normal.    BP 110/60 (BP Location: Left Arm, Patient Position: Sitting, Cuff Size: Normal)   Pulse 73   Temp 98.8 F (37.1 C) (Oral)   Resp 18   Ht 5\' 8"  (1.727 m)   Wt 138 lb 6.4 oz (62.8 kg)   SpO2 98%   BMI 21.04 kg/m  Wt Readings from Last 3 Encounters:  06/09/21 138 lb 6.4 oz (62.8 kg)  06/03/20 138 lb 6.4 oz (62.8 kg)  05/29/19 137 lb 6.4 oz (62.3 kg)    Diabetic Foot Exam - Simple   No data filed    Lab Results  Component Value Date   WBC 7.8 06/03/2020   HGB 13.1 06/03/2020   HCT 38.8 06/03/2020   PLT 251.0 06/03/2020   GLUCOSE 83 06/03/2020   CHOL 256 (H) 06/03/2020   TRIG 185.0 (H) 06/03/2020   HDL 42.10 06/03/2020   LDLDIRECT 169.0 05/29/2019   LDLCALC 177 (H) 06/03/2020   ALT 19 06/03/2020   AST 21 06/03/2020   NA 139 06/03/2020   K 4.6 06/03/2020   CL 104 06/03/2020   CREATININE 0.82 06/03/2020   BUN 21 06/03/2020   CO2 29 06/03/2020   TSH 2.21 11/15/2017   INR 1.19  01/06/2012    Lab Results  Component Value Date   TSH 2.21 11/15/2017   Lab Results  Component Value Date   WBC 7.8 06/03/2020   HGB 13.1 06/03/2020   HCT 38.8 06/03/2020   MCV 93.7 06/03/2020   PLT 251.0 06/03/2020   Lab Results  Component Value Date   NA 139 06/03/2020   K 4.6 06/03/2020   CO2 29 06/03/2020   GLUCOSE 83 06/03/2020   BUN 21 06/03/2020   CREATININE 0.82 06/03/2020   BILITOT 0.5 06/03/2020   ALKPHOS 90 06/03/2020   AST 21 06/03/2020   ALT 19 06/03/2020   PROT 6.7 06/03/2020   ALBUMIN 4.2 06/03/2020   CALCIUM 9.2 06/03/2020   GFR 68.66 06/03/2020   Lab Results  Component Value Date   CHOL 256 (H) 06/03/2020   Lab Results  Component Value Date   HDL 42.10 06/03/2020   Lab Results  Component Value Date   LDLCALC 177 (H) 06/03/2020   Lab Results  Component Value Date   TRIG 185.0 (H) 06/03/2020   Lab Results  Component Value Date   CHOLHDL 6 06/03/2020   No results found for: HGBA1C  Mammogram- Last completed 09/19/2020. Results normal. Repeat in 1 year.  Dexa- Last completed 08/20/2019. Results showed she is osteoporotic. Repeat in 2 years.  Colonoscopy- Last completed 07/01/2015. Results showed external and internal hemorrhoids, diverticulosis in sigmoid colon, otherwise results are normal. Repeat in 6 years.      Assessment & Plan:   Problem List Items Addressed This Visit       Unprioritized   Hyperlipidemia   Relevant Orders   Lipid panel   Comprehensive metabolic panel   Depression with anxiety  Stable Refill zoloft        Relevant Medications   sertraline (ZOLOFT) 50 MG tablet   Other Relevant Orders   Lipid panel   CBC with Differential/Platelet   Comprehensive metabolic panel   Rheumatoid Factor   History of anemia   Relevant Medications   sertraline (ZOLOFT) 50 MG tablet   Other Relevant Orders   CBC with Differential/Platelet   Pain in right hand    ? Trigger finger  Pt is requesting an xray          Relevant Orders   DG Hand Complete Right   Comprehensive metabolic panel   Rheumatoid Factor   Preventative health care - Primary    ghm utd Check labs  See AVS         Meds ordered this encounter  Medications   sertraline (ZOLOFT) 50 MG tablet    Sig: TAKE 1 AND 1/2 TABLETS BY MOUTH EVERY DAY    Dispense:  135 tablet    Refill:  3    I, Dr. Seabron SpatesLowne-Chase, Britiny Defrain, DO, personally preformed the services described in this documentation.  All medical record entries made by the scribe were at my direction and in my presence.  I have reviewed the chart and discharge instructions (if applicable) and agree that the record reflects my personal performance and is accurate and complete. 06/09/2021   I,[name],acting as a scribe for Donato SchultzYvonne R Lowne Chase, DO.,have documented all relevant documentation on the behalf of Donato SchultzYvonne R Lowne Chase, DO,as directed by  Donato SchultzYvonne R Lowne Chase, DO while in the presence of Donato SchultzYvonne R Lowne Chase, DO.   Donato SchultzYvonne R Lowne Chase, DO

## 2021-06-09 NOTE — Patient Instructions (Signed)
Preventive Care 72 Years and Older, Female Preventive care refers to lifestyle choices and visits with your health care provider that can promote health and wellness. This includes: A yearly physical exam. This is also called an annual wellness visit. Regular dental and eye exams. Immunizations. Screening for certain conditions. Healthy lifestyle choices, such as: Eating a healthy diet. Getting regular exercise. Not using drugs or products that contain nicotine and tobacco. Limiting alcohol use. What can I expect for my preventive care visit? Physical exam Your health care provider will check your: Height and weight. These may be used to calculate your BMI (body mass index). BMI is a measurement that tells if you are at a healthy weight. Heart rate and blood pressure. Body temperature. Skin for abnormal spots. Counseling Your health care provider may ask you questions about your: Past medical problems. Family's medical history. Alcohol, tobacco, and drug use. Emotional well-being. Home life and relationship well-being. Sexual activity. Diet, exercise, and sleep habits. History of falls. Memory and ability to understand (cognition). Work and work Statistician. Pregnancy and menstrual history. Access to firearms. What immunizations do I need?  Vaccines are usually given at various ages, according to a schedule. Your health care provider will recommend vaccines for you based on your age, medicalhistory, and lifestyle or other factors, such as travel or where you work. What tests do I need? Blood tests Lipid and cholesterol levels. These may be checked every 5 years, or more often depending on your overall health. Hepatitis C test. Hepatitis B test. Screening Lung cancer screening. You may have this screening every year starting at age 44 if you have a 30-pack-year history of smoking and currently smoke or have quit within the past 15 years. Colorectal cancer screening. All  adults should have this screening starting at age 39 and continuing until age 65. Your health care provider may recommend screening at age 61 if you are at increased risk. You will have tests every 1-10 years, depending on your results and the type of screening test. Diabetes screening. This is done by checking your blood sugar (glucose) after you have not eaten for a while (fasting). You may have this done every 1-3 years. Mammogram. This may be done every 1-2 years. Talk with your health care provider about how often you should have regular mammograms. Abdominal aortic aneurysm (AAA) screening. You may need this if you are a current or former smoker. BRCA-related cancer screening. This may be done if you have a family history of breast, ovarian, tubal, or peritoneal cancers. Other tests STD (sexually transmitted disease) testing, if you are at risk. Bone density scan. This is done to screen for osteoporosis. You may have this done starting at age 54. Talk with your health care provider about your test results, treatment options,and if necessary, the need for more tests. Follow these instructions at home: Eating and drinking  Eat a diet that includes fresh fruits and vegetables, whole grains, lean protein, and low-fat dairy products. Limit your intake of foods with high amounts of sugar, saturated fats, and salt. Take vitamin and mineral supplements as recommended by your health care provider. Do not drink alcohol if your health care provider tells you not to drink. If you drink alcohol: Limit how much you have to 0-1 drink a day. Be aware of how much alcohol is in your drink. In the [place] one drink equals one 12 oz bottle of beer (355 mL), one 5 oz glass of wine (148 mL), or one 1  oz glass of hard liquor (44 mL).  Lifestyle Take daily care of your teeth and gums. Brush your teeth every morning and night with fluoride toothpaste. Floss one time each day. Stay active. Exercise for at  least 30 minutes 5 or more days each week. Do not use any products that contain nicotine or tobacco, such as cigarettes, e-cigarettes, and chewing tobacco. If you need help quitting, ask your health care provider. Do not use drugs. If you are sexually active, practice safe sex. Use a condom or other form of protection in order to prevent STIs (sexually transmitted infections). Talk with your health care provider about taking a low-dose aspirin or statin. Find healthy ways to cope with stress, such as: Meditation, yoga, or listening to music. Journaling. Talking to a trusted person. Spending time with friends and family. Safety Always wear your seat belt while driving or riding in a vehicle. Do not drive: If you have been drinking alcohol. Do not ride with someone who has been drinking. When you are tired or distracted. While texting. Wear a helmet and other protective equipment during sports activities. If you have firearms in your house, make sure you follow all gun safety procedures. What's next? Visit your health care provider once a year for an annual wellness visit. Ask your health care provider how often you should have your eyes and teeth checked. Stay up to date on all vaccines. This information is not intended to replace advice given to you by your health care provider. Make sure you discuss any questions you have with your healthcare provider. Document Revised: 10/08/2020 Document Reviewed: 10/12/2018 Elsevier Patient Education  2022 [employer].

## 2021-06-09 NOTE — Assessment & Plan Note (Signed)
?   Trigger finger  Pt is requesting an xray

## 2021-06-09 NOTE — Assessment & Plan Note (Signed)
Stable Refill zoloft  

## 2021-06-09 NOTE — Assessment & Plan Note (Signed)
ghm utd Check labs See AVS 

## 2021-06-10 LAB — RHEUMATOID FACTOR: Rheumatoid fact SerPl-aCnc: 27 IU/mL — ABNORMAL HIGH (ref <14)

## 2021-06-12 ENCOUNTER — Telehealth: Payer: Self-pay | Admitting: *Deleted

## 2021-06-12 ENCOUNTER — Encounter: Payer: Self-pay | Admitting: *Deleted

## 2021-06-12 DIAGNOSIS — R768 Other specified abnormal immunological findings in serum: Secondary | ICD-10-CM

## 2021-06-12 NOTE — Telephone Encounter (Signed)
-----   Message from [name], DO sent at 06/10/2021 12:35 PM EDT ----- Cholesterol--- LDL goal < 100,  HDL >40,  TG < 150.  Diet and exercise will increase HDL and decrease LDL and TG.  Fish,  Fish Oil, Flaxseed oil will also help increase the HDL and decrease Triglycerides.   Recheck labs in 3 months Should start meds------  crestor 10 mg #30  1po qhs, 2 refills  Rheum factor +----   refer to rheumatology .

## 2021-06-12 NOTE — Telephone Encounter (Signed)
Spoke with patient and she stated that she has not been taking Crestor (which is on her list still), because it makes her feel achy and feels hot.  She stated that her friends take red yeast rice and if that would be ok along working on diet?  Her diet has not been good.  She does take fish oil every day.

## 2021-06-15 NOTE — Telephone Encounter (Signed)
Pt called. LVM 

## 2021-09-17 ENCOUNTER — Telehealth: Payer: Self-pay | Admitting: Family Medicine

## 2021-09-17 NOTE — Telephone Encounter (Signed)
Left message for patient to call back and schedule Medicare Annual Wellness Visit (AWV) in office.  ? ?If not able to come in office, please offer to do virtually or by telephone.  Left office number and my jabber #[phone]. ? ?Last AWV:11/30/2019 ? ?Please schedule at anytime with Nurse Health Advisor. ?  ?

## 2021-10-20 ENCOUNTER — Telehealth: Payer: Self-pay | Admitting: Family Medicine

## 2021-10-20 NOTE — Telephone Encounter (Signed)
Left message for patient to call back and schedule Medicare Annual Wellness Visit (AWV) in office.  ? ?If not able to come in office, please offer to do virtually or by telephone.  Left office number and my jabber #[phone]. ? ?Last AWV:11/30/2019 ? ?Please schedule at anytime with Nurse Health Advisor. ?  ?

## 2021-12-15 ENCOUNTER — Ambulatory Visit: Payer: 59 | Admitting: Family Medicine

## 2022-01-01 ENCOUNTER — Telehealth: Payer: Self-pay | Admitting: Family Medicine

## 2022-01-01 NOTE — Telephone Encounter (Signed)
Left message for patient to call back and schedule Medicare Annual Wellness Visit (AWV) in office.  ° °If not able to come in office, please offer to do virtually or by telephone.  Left office number and my jabber #[phone]. ° °Due for AWVI ° °Please schedule at anytime with Nurse Health Advisor. °  °

## 2022-03-10 ENCOUNTER — Telehealth: Payer: Self-pay | Admitting: Family Medicine

## 2022-03-10 NOTE — Telephone Encounter (Signed)
Left message for patient to call back and schedule Medicare Annual Wellness Visit (AWV).   Please offer to do virtually or by telephone.  Left office number and my jabber #[phone].  Last AWV:11/30/2019   Please schedule at anytime with Nurse Health Advisor.  

## 2022-04-28 ENCOUNTER — Telehealth: Payer: Self-pay | Admitting: Family Medicine

## 2022-04-28 NOTE — Telephone Encounter (Signed)
Pt dropped off document to be filled out by provider (1 page [employer] disability form) Pt would like to be called when document ready at [phone]. Document put at front office tray under providers name.

## 2022-04-30 NOTE — Telephone Encounter (Signed)
Placed in folder for sig

## 2022-06-10 ENCOUNTER — Ambulatory Visit (INDEPENDENT_AMBULATORY_CARE_PROVIDER_SITE_OTHER): Payer: 59 | Admitting: Family Medicine

## 2022-06-10 ENCOUNTER — Ambulatory Visit (HOSPITAL_BASED_OUTPATIENT_CLINIC_OR_DEPARTMENT_OTHER)
Admission: RE | Admit: 2022-06-10 | Discharge: 2022-06-10 | Disposition: A | Discharge status: Home / Self Care | Payer: 59 | Source: Ambulatory Visit | Attending: Family Medicine | Admitting: Family Medicine

## 2022-06-10 ENCOUNTER — Encounter: Payer: Self-pay | Admitting: Family Medicine

## 2022-06-10 VITALS — BP 124/80 | HR 67 | Temp 98.1°F | Resp 18 | Ht 68.0 in | Wt 135.2 lb

## 2022-06-10 DIAGNOSIS — Z862 Personal history of diseases of the blood and blood-forming organs and certain disorders involving the immune mechanism: Secondary | ICD-10-CM | POA: Diagnosis not present

## 2022-06-10 DIAGNOSIS — J479 Bronchiectasis, uncomplicated: Secondary | ICD-10-CM | POA: Insufficient documentation

## 2022-06-10 DIAGNOSIS — Z0000 Encounter for general adult medical examination without abnormal findings: Secondary | ICD-10-CM | POA: Diagnosis not present

## 2022-06-10 DIAGNOSIS — E2839 Other primary ovarian failure: Secondary | ICD-10-CM

## 2022-06-10 DIAGNOSIS — R300 Dysuria: Secondary | ICD-10-CM | POA: Diagnosis not present

## 2022-06-10 DIAGNOSIS — E785 Hyperlipidemia, unspecified: Secondary | ICD-10-CM

## 2022-06-10 DIAGNOSIS — F418 Other specified anxiety disorders: Secondary | ICD-10-CM | POA: Diagnosis not present

## 2022-06-10 DIAGNOSIS — Z1231 Encounter for screening mammogram for malignant neoplasm of breast: Secondary | ICD-10-CM

## 2022-06-10 LAB — POC URINALSYSI DIPSTICK (AUTOMATED)
Bilirubin, UA: NEGATIVE
Blood, UA: NEGATIVE
Glucose, UA: NEGATIVE
Ketones, UA: NEGATIVE
Leukocytes, UA: NEGATIVE
Nitrite, UA: NEGATIVE
Protein, UA: NEGATIVE
Spec Grav, UA: 1.02 (ref 1.010–1.025)
Urobilinogen, UA: 0.2 E.U./dL
pH, UA: 5 (ref 5.0–8.0)

## 2022-06-10 LAB — LIPID PANEL
Cholesterol: 267 mg/dL — ABNORMAL HIGH (ref 0–200)
HDL: 43.3 mg/dL (ref >39.00)
NonHDL: 223.76
Total CHOL/HDL Ratio: 6
Triglycerides: 223 mg/dL — ABNORMAL HIGH (ref 0.0–149.0)
VLDL: 44.6 mg/dL — ABNORMAL HIGH (ref 0.0–40.0)

## 2022-06-10 LAB — CBC WITH DIFFERENTIAL/PLATELET
Basophils Absolute: 0 10*3/uL (ref 0.0–0.1)
Basophils Relative: 0.6 % (ref 0.0–3.0)
Eosinophils Absolute: 0.2 10*3/uL (ref 0.0–0.7)
Eosinophils Relative: 2 % (ref 0.0–5.0)
HCT: 38.8 % (ref 36.0–46.0)
Hemoglobin: 13 g/dL (ref 12.0–15.0)
Lymphocytes Relative: 27.2 % (ref 12.0–46.0)
Lymphs Abs: 2.2 10*3/uL (ref 0.7–4.0)
MCHC: 33.5 g/dL (ref 30.0–36.0)
MCV: 93.8 fl (ref 78.0–100.0)
Monocytes Absolute: 0.5 10*3/uL (ref 0.1–1.0)
Monocytes Relative: 6 % (ref 3.0–12.0)
Neutro Abs: 5.1 10*3/uL (ref 1.4–7.7)
Neutrophils Relative %: 64.2 % (ref 43.0–77.0)
Platelets: 307 10*3/uL (ref 150.0–400.0)
RBC: 4.13 Mil/uL (ref 3.87–5.11)
RDW: 13.9 % (ref 11.5–15.5)
WBC: 8 10*3/uL (ref 4.0–10.5)

## 2022-06-10 LAB — VITAMIN D 25 HYDROXY (VIT D DEFICIENCY, FRACTURES): VITD: 28.33 ng/mL — ABNORMAL LOW (ref 30.00–100.00)

## 2022-06-10 LAB — COMPREHENSIVE METABOLIC PANEL
ALT: 16 U/L (ref 0–35)
AST: 21 U/L (ref 0–37)
Albumin: 4.4 g/dL (ref 3.5–5.2)
Alkaline Phosphatase: 94 U/L (ref 39–117)
BUN: 18 mg/dL (ref 6–23)
CO2: 27 mEq/L (ref 19–32)
Calcium: 9.3 mg/dL (ref 8.4–10.5)
Chloride: 104 mEq/L (ref 96–112)
Creatinine, Ser: 0.87 mg/dL (ref 0.40–1.20)
GFR: 66.14 mL/min (ref >60.00)
Glucose, Bld: 83 mg/dL (ref 70–99)
Potassium: 4.5 mEq/L (ref 3.5–5.1)
Sodium: 139 mEq/L (ref 135–145)
Total Bilirubin: 0.5 mg/dL (ref 0.2–1.2)
Total Protein: 7.2 g/dL (ref 6.0–8.3)

## 2022-06-10 LAB — LDL CHOLESTEROL, DIRECT: Direct LDL: 187 mg/dL

## 2022-06-10 MED ORDER — SERTRALINE HCL 100 MG PO TABS: 100.0000 mg | ORAL_TABLET | Freq: Every day | ORAL | 3 refills | Status: DC

## 2022-06-10 NOTE — Assessment & Plan Note (Signed)
ghm utd Check labs  See avs  

## 2022-06-10 NOTE — Assessment & Plan Note (Signed)
Encourage heart healthy diet such as MIND or DASH diet, increase exercise, avoid trans fats, simple carbohydrates and processed foods, consider a krill or fish or flaxseed oil cap daily. ---- pt has not been able to tolerate statins

## 2022-06-10 NOTE — Patient Instructions (Signed)
Preventive Care 65 Years and Older, Female Preventive care refers to lifestyle choices and visits with your health care provider that can promote health and wellness. Preventive care visits are also called wellness exams. What can I expect for my preventive care visit? Counseling Your health care provider may ask you questions about your: Medical history, including: Past medical problems. Family medical history. Pregnancy and menstrual history. History of falls. Current health, including: Memory and ability to understand (cognition). Emotional well-being. Home life and relationship well-being. Sexual activity and sexual health. Lifestyle, including: Alcohol, nicotine or tobacco, and drug use. Access to firearms. Diet, exercise, and sleep habits. Work and work environment. Sunscreen use. Safety issues such as seatbelt and bike helmet use. Physical exam Your health care provider will check your: Height and weight. These may be used to calculate your BMI (body mass index). BMI is a measurement that tells if you are at a healthy weight. Waist circumference. This measures the distance around your waistline. This measurement also tells if you are at a healthy weight and may help predict your risk of certain diseases, such as type 2 diabetes and high blood pressure. Heart rate and blood pressure. Body temperature. Skin for abnormal spots. What immunizations do I need?  Vaccines are usually given at various ages, according to a schedule. Your health care provider will recommend vaccines for you based on your age, medical history, and lifestyle or other factors, such as travel or where you work. What tests do I need? Screening Your health care provider may recommend screening tests for certain conditions. This may include: Lipid and cholesterol levels. Hepatitis C test. Hepatitis B test. HIV (human immunodeficiency virus) test. STI (sexually transmitted infection) testing, if you are at  risk. Lung cancer screening. Colorectal cancer screening. Diabetes screening. This is done by checking your blood sugar (glucose) after you have not eaten for a while (fasting). Mammogram. Talk with your health care provider about how often you should have regular mammograms. BRCA-related cancer screening. This may be done if you have a family history of breast, ovarian, tubal, or peritoneal cancers. Bone density scan. This is done to screen for osteoporosis. Talk with your health care provider about your test results, treatment options, and if necessary, the need for more tests. Follow these instructions at home: Eating and drinking  Eat a diet that includes fresh fruits and vegetables, whole grains, lean protein, and low-fat dairy products. Limit your intake of foods with high amounts of sugar, saturated fats, and salt. Take vitamin and mineral supplements as recommended by your health care provider. Do not drink alcohol if your health care provider tells you not to drink. If you drink alcohol: Limit how much you have to 0-1 drink a day. Know how much alcohol is in your drink. In the [place] one drink equals one 12 oz bottle of beer (355 mL), one 5 oz glass of wine (148 mL), or one 1 oz glass of hard liquor (44 mL). Lifestyle Brush your teeth every morning and night with fluoride toothpaste. Floss one time each day. Exercise for at least 30 minutes 5 or more days each week. Do not use any products that contain nicotine or tobacco. These products include cigarettes, chewing tobacco, and vaping devices, such as e-cigarettes. If you need help quitting, ask your health care provider. Do not use drugs. If you are sexually active, practice safe sex. Use a condom or other form of protection in order to prevent STIs. Take aspirin only as told by   your health care provider. Make sure that you understand how much to take and what form to take. Work with your health care provider to find out whether it  is safe and beneficial for you to take aspirin daily. Ask your health care provider if you need to take a cholesterol-lowering medicine (statin). Find healthy ways to manage stress, such as: Meditation, yoga, or listening to music. Journaling. Talking to a trusted person. Spending time with friends and family. Minimize exposure to UV radiation to reduce your risk of skin cancer. Safety Always wear your seat belt while driving or riding in a vehicle. Do not drive: If you have been drinking alcohol. Do not ride with someone who has been drinking. When you are tired or distracted. While texting. If you have been using any mind-altering substances or drugs. Wear a helmet and other protective equipment during sports activities. If you have firearms in your house, make sure you follow all gun safety procedures. What's next? Visit your health care provider once a year for an annual wellness visit. Ask your health care provider how often you should have your eyes and teeth checked. Stay up to date on all vaccines. This information is not intended to replace advice given to you by your health care provider. Make sure you discuss any questions you have with your health care provider. Document Revised: 04/15/2021 Document Reviewed: 04/15/2021 Elsevier Patient Education  2023 Elsevier Inc.  

## 2022-06-10 NOTE — Assessment & Plan Note (Signed)
F/u pulmonary  

## 2022-06-10 NOTE — Progress Notes (Signed)
Subjective:   By signing my name below, I, Donna Tapia, attest that this documentation has been prepared under the direction and in the presence of Donna Schultz, DO  06/10/2022    Patient ID: Donna Tapia, female    DOB: December 23, 1950, 71 y.o.   MRN: [id number]  Chief Complaint  Patient presents with   Annual Exam    Pt states fasting. Pt states she would like to increase Zoloft to 100 MG because she doesn't want to cut the pills anymore.     HPI Patient is in today for a comprehensive physical exam.   She complains of mild pain while urinating. She denies any burning while urinating.  She is requesting to increase her Zoloft dosage to 100 mg.   She is not taking 10 mg Crestor daily PO due to having aches, developing rash, and feeling hot while taking it. She prefers to manage her cholesterol through her diet before starting another medication.  Lab Results  Component Value Date   CHOL 281 (H) 06/09/2021   HDL 45.60 06/09/2021   LDLCALC 177 (H) 06/03/2020   LDLDIRECT 204.0 06/09/2021   TRIG 253.0 (H) 06/09/2021   CHOLHDL 6 06/09/2021   She denies having any fever, new moles, congestion, sinus pain, sore throat, chest pain, palpitations, cough, shortness of breath, wheezing, nausea, vomiting, diarrhea, constipation, frequency, abdominal pain, hematuria, new muscle pain, new joint pain, headaches. She does not have the shingles vaccine. She does not have the tetanus vaccine. She is UTD on pneumonia vaccines.    Past Medical History:  Diagnosis Date   Anemia    Chicken pox    Environmental allergies    Hepatitis B 1978   Hyperlipidemia     Past Surgical History:  Procedure Laterality Date   ABDOMINAL HYSTERECTOMY     APPENDECTOMY     EVISCERATION  01/07/2012   Procedure: EVISCERATION REPAIR;  Surgeon: Antony Contras, MD;  Location: [facility] OR;  Service: Ophthalmology;  Laterality: Left;  Evisceration and removal of contents left eye   LUMBAR LAMINECTOMY/DECOMPRESSION  MICRODISCECTOMY  01/04/2012   Procedure: LUMBAR LAMINECTOMY/DECOMPRESSION MICRODISCECTOMY 1 LEVEL;  Surgeon: Reinaldo Meeker, MD;  Location: [facility]UR[facility] ORS;  Service: Neurosurgery;  Laterality: N/A;  Lumbar Laminectomy for Epidural Abscess   TEE WITHOUT CARDIOVERSION  01/10/2012   Procedure: TRANSESOPHAGEAL ECHOCARDIOGRAM (TEE);  Surgeon: Pricilla Riffle, MD;  Location: [facility] ENDOSCOPY;  Service: Cardiovascular;  Laterality: N/A;    Family History  Adopted: Yes  Problem Relation Age of Onset   Coronary artery disease Mother        Adopted mother   Parkinsonism Mother        Adopted Mother   Colon cancer Mother    Colon cancer Father    Hyperlipidemia Paternal Aunt    Hyperlipidemia Paternal Uncle    Heart disease Other        Both families   Hypertension Other     Social History   Socioeconomic History   Marital status: Single    Spouse name: Not on file   Number of children: Not on file   Years of education: Not on file   Highest education level: Not on file  Occupational History   Occupation: retired  Tobacco Use   Smoking status: Never   Smokeless tobacco: Never  Substance and Sexual Activity   Alcohol use: No   Drug use: No   Sexual activity: Never  Other Topics Concern   Not on file  Social History Narrative   Exercise- walks   Social Determinants of Health   Financial Resource Strain: Low Risk  (11/30/2019)   Overall Financial Resource Strain (CARDIA)    Difficulty of Paying Living Expenses: Not hard at all  Food Insecurity: No Food Insecurity (11/30/2019)   Hunger Vital Sign    Worried About Running Out of Food in the Last Year: Never true    Ran Out of Food in the Last Year: Never true  Transportation Needs: No Transportation Needs (11/30/2019)   PRAPARE - Administrator, Civil Service (Medical): No    Lack of Transportation (Non-Medical): No  Physical Activity: Not on file  Stress: Not on file  Social Connections: Not on file  Intimate Partner  Violence: Not on file    Outpatient Medications Prior to Visit  Medication Sig Dispense Refill   acetaminophen (TYLENOL) 325 MG tablet Take 325 mg by mouth every 4 (four) hours as needed. For pain      artificial tears (LACRILUBE) OINT ophthalmic ointment Apply to eye 4 (four) times daily. 1 Tube 0   calcium carbonate (OS-CAL - DOSED IN MG OF ELEMENTAL CALCIUM) 1250 (500 Ca) MG tablet Take 1 tablet by mouth daily with breakfast.     Cholecalciferol (VITAMIN D3) 2000 units TABS Take 1 tablet by mouth daily.     CO ENZYME Q-10 PO Take 1 capsule by mouth daily.     folic acid (FOLVITE) 400 MCG tablet Take 400 mcg by mouth 3 (three) times a week.      Multiple Vitamin (MULITIVITAMIN WITH MINERALS) TABS Take 1 tablet by mouth daily.     Omega-3 Fatty Acids (FISH OIL) 1000 MG CAPS Take 1 capsule by mouth daily.     sertraline (ZOLOFT) 50 MG tablet TAKE 1 AND 1/2 TABLETS BY MOUTH EVERY DAY 135 tablet 3   rosuvastatin (CRESTOR) 10 MG tablet Take 1 tablet (10 mg total) by mouth daily. (Patient not taking: Reported on 06/09/2021) 30 tablet 2   No facility-administered medications prior to visit.    Allergies  Allergen Reactions   Elemental Sulfur Anaphylaxis and Rash   Statins Other (See Comments)    myalgia    Review of Systems  Constitutional:  Negative for fever and malaise/fatigue.  HENT:  Negative for congestion, sinus pain and sore throat.   Eyes:  Negative for blurred vision.  Respiratory:  Negative for cough, shortness of breath and wheezing.   Cardiovascular:  Negative for chest pain, palpitations and leg swelling.  Gastrointestinal:  Negative for abdominal pain, blood in stool, constipation, diarrhea, nausea and vomiting.  Genitourinary:  Negative for dysuria, frequency and hematuria.       (+)pain while urinating  Musculoskeletal:  Negative for falls.       (-)new muscle pain (-)new joint pain  Skin:  Negative for rash.       (-)New moles  Neurological:  Negative for dizziness,  loss of consciousness and headaches.  Endo/Heme/Allergies:  Negative for environmental allergies.  Psychiatric/Behavioral:  Negative for depression. The patient is not nervous/anxious.        Objective:    Physical Exam Vitals and nursing note reviewed.  Constitutional:      General: She is not in acute distress.    Appearance: Normal appearance. She is not ill-appearing.  HENT:     Head: Normocephalic and atraumatic.     Right Ear: Tympanic membrane, ear canal and external ear normal.     Left Ear: Tympanic  membrane, ear canal and external ear normal.  Eyes:     Extraocular Movements: Extraocular movements intact.     Pupils: Pupils are equal, round, and reactive to light.  Cardiovascular:     Rate and Rhythm: Normal rate and regular rhythm.     Heart sounds: Normal heart sounds. No murmur heard.    No gallop.  Pulmonary:     Effort: Pulmonary effort is normal. No respiratory distress.     Breath sounds: Normal breath sounds. No wheezing or rales.  Abdominal:     General: Bowel sounds are normal. There is no distension.     Palpations: Abdomen is soft.     Tenderness: There is no abdominal tenderness. There is no guarding.  Skin:    General: Skin is warm and dry.  Neurological:     Mental Status: She is alert and oriented to person, place, and time.  Psychiatric:        Judgment: Judgment normal.     BP 124/80 (BP Location: Left Arm, Patient Position: Sitting, Cuff Size: Normal)   Pulse 67   Temp 98.1 F (36.7 C) (Oral)   Resp 18   Ht 5\' 8"  (1.727 m)   Wt 135 lb 3.2 oz (61.3 kg)   SpO2 97%   BMI 20.56 kg/m  Wt Readings from Last 3 Encounters:  06/10/22 135 lb 3.2 oz (61.3 kg)  06/09/21 138 lb 6.4 oz (62.8 kg)  06/03/20 138 lb 6.4 oz (62.8 kg)    Diabetic Foot Exam - Simple   No data filed    Lab Results  Component Value Date   WBC 6.5 06/09/2021   HGB 13.0 06/09/2021   HCT 38.4 06/09/2021   PLT 268.0 06/09/2021   GLUCOSE 83 06/09/2021   CHOL 281 (H)  06/09/2021   TRIG 253.0 (H) 06/09/2021   HDL 45.60 06/09/2021   LDLDIRECT 204.0 06/09/2021   LDLCALC 177 (H) 06/03/2020   ALT 19 06/09/2021   AST 21 06/09/2021   NA 137 06/09/2021   K 4.4 06/09/2021   CL 101 06/09/2021   CREATININE 0.90 06/09/2021   BUN 18 06/09/2021   CO2 29 06/09/2021   TSH 2.21 11/15/2017   INR 1.19 01/06/2012    Lab Results  Component Value Date   TSH 2.21 11/15/2017   Lab Results  Component Value Date   WBC 6.5 06/09/2021   HGB 13.0 06/09/2021   HCT 38.4 06/09/2021   MCV 93.5 06/09/2021   PLT 268.0 06/09/2021   Lab Results  Component Value Date   NA 137 06/09/2021   K 4.4 06/09/2021   CO2 29 06/09/2021   GLUCOSE 83 06/09/2021   BUN 18 06/09/2021   CREATININE 0.90 06/09/2021   BILITOT 0.5 06/09/2021   ALKPHOS 84 06/09/2021   AST 21 06/09/2021   ALT 19 06/09/2021   PROT 6.9 06/09/2021   ALBUMIN 4.2 06/09/2021   CALCIUM 9.5 06/09/2021   GFR 63.95 06/09/2021   Lab Results  Component Value Date   CHOL 281 (H) 06/09/2021   Lab Results  Component Value Date   HDL 45.60 06/09/2021   Lab Results  Component Value Date   LDLCALC 177 (H) 06/03/2020   Lab Results  Component Value Date   TRIG 253.0 (H) 06/09/2021   Lab Results  Component Value Date   CHOLHDL 6 06/09/2021   No results found for: "HGBA1C"  Mammogram: Last completed 09/16/2020. Results are normal. Repeat in 1 year.  Dexa: Last completed 08/20/2019. Results showed:  The BMD measured at Femur Total Left is 0.619 g/cm2 with a T-score of -3.1. This patient is considered osteoporotic according to [facility]) criteria. Repeat in 2 years. Colonoscopy: Last completed 07/01/2015. Results showed external and internal hemorrhoids, diverticulosis in the sigmoid colon, otherwise results are normal.      Assessment & Plan:   Problem List Items Addressed This Visit       Unprioritized   History of anemia   Depression with anxiety   Relevant Medications    sertraline (ZOLOFT) 100 MG tablet   Preventative health care - Primary    ghm utd Check labs  See avs       Relevant Orders   VITAMIN D 25 Hydroxy (Vit-D Deficiency, Fractures)   Lipid panel   Comprehensive metabolic panel   CBC with Differential/Platelet   Hyperlipidemia    Encourage heart healthy diet such as MIND or DASH diet, increase exercise, avoid trans fats, simple carbohydrates and processed foods, consider a krill or fish or flaxseed oil cap daily. ---- pt has not been able to tolerate statins       Relevant Orders   VITAMIN D 25 Hydroxy (Vit-D Deficiency, Fractures)   Lipid panel   Comprehensive metabolic panel   CBC with Differential/Platelet   Bronchiectasis without complication (HCC)    F/u pulmonary      Relevant Orders   Ambulatory referral to Pulmonology   DG Chest 2 View   Other Visit Diagnoses     Estrogen deficiency       Relevant Orders   DG Bone Density   Screening mammogram for breast cancer       Relevant Orders   MM 3D SCREEN BREAST BILATERAL   Encounter for screening mammogram for high-risk patient       Relevant Orders   MM 3D SCREEN BREAST BILATERAL   Dysuria       Relevant Orders   POCT Urinalysis Dipstick (Automated) (Completed)        Meds ordered this encounter  Medications   sertraline (ZOLOFT) 100 MG tablet    Sig: Take 1 tablet (100 mg total) by mouth daily.    Dispense:  90 tablet    Refill:  3    I, Ann Held, DO, personally preformed the services described in this documentation.  All medical record entries made by the scribe were at my direction and in my presence.  I have reviewed the chart and discharge instructions (if applicable) and agree that the record reflects my personal performance and is accurate and complete. 06/10/2022   I,Donna Tapia,acting as a scribe for Ann Held, DO.,have documented all relevant documentation on the behalf of Ann Held, DO,as directed by  Ann Held, DO while in the presence of Ann Held, DO.   Ann Held, DO

## 2022-06-15 ENCOUNTER — Other Ambulatory Visit: Payer: Self-pay | Admitting: Family Medicine

## 2022-06-15 DIAGNOSIS — Z1231 Encounter for screening mammogram for malignant neoplasm of breast: Secondary | ICD-10-CM

## 2022-06-16 ENCOUNTER — Other Ambulatory Visit: Payer: Self-pay

## 2022-06-16 DIAGNOSIS — E785 Hyperlipidemia, unspecified: Secondary | ICD-10-CM

## 2022-06-16 DIAGNOSIS — E559 Vitamin D deficiency, unspecified: Secondary | ICD-10-CM

## 2022-06-16 MED ORDER — VITAMIN D (ERGOCALCIFEROL) 1.25 MG (50000 UNIT) PO CAPS: 50000.0000 [IU] | ORAL_CAPSULE | ORAL | 4 refills | Status: DC

## 2022-06-16 MED ORDER — EZETIMIBE 10 MG PO TABS: 10.0000 mg | ORAL_TABLET | Freq: Every day | ORAL | 1 refills | Status: AC

## 2022-07-12 ENCOUNTER — Other Ambulatory Visit: Payer: Self-pay | Admitting: Family Medicine

## 2022-07-12 DIAGNOSIS — F418 Other specified anxiety disorders: Secondary | ICD-10-CM

## 2022-07-12 DIAGNOSIS — Z862 Personal history of diseases of the blood and blood-forming organs and certain disorders involving the immune mechanism: Secondary | ICD-10-CM

## 2022-07-15 ENCOUNTER — Ambulatory Visit (INDEPENDENT_AMBULATORY_CARE_PROVIDER_SITE_OTHER): Payer: 59 | Admitting: Emergency Medicine

## 2022-07-15 ENCOUNTER — Other Ambulatory Visit (HOSPITAL_COMMUNITY): Payer: Self-pay

## 2022-07-15 ENCOUNTER — Encounter: Payer: Self-pay | Admitting: Emergency Medicine

## 2022-07-15 VITALS — BP 116/64 | HR 66 | Temp 98.6°F | Ht 68.0 in | Wt 135.6 lb

## 2022-07-15 DIAGNOSIS — R053 Chronic cough: Secondary | ICD-10-CM

## 2022-07-15 DIAGNOSIS — Z23 Encounter for immunization: Secondary | ICD-10-CM | POA: Diagnosis not present

## 2022-07-15 DIAGNOSIS — J479 Bronchiectasis, uncomplicated: Secondary | ICD-10-CM

## 2022-07-15 MED ORDER — LORATADINE 10 MG PO TABS
10.0000 mg | ORAL_TABLET | Freq: Every day | ORAL | 5 refills | Status: AC
  Filled 2022-07-15: qty 30, 30d supply, fill #0

## 2022-07-15 NOTE — Assessment & Plan Note (Signed)
Overall appears to be stable.  She had a chest x-ray 06/10/2022 that I reviewed that showed stable left upper lobe scar and no other abnormalities.  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  If symptoms change then we will reimage.  No clear indication for flutter valve or secretion clearance techniques at this juncture

## 2022-07-15 NOTE — Patient Instructions (Signed)
We reviewed the chest x-rays today. We will hold off on a repeat CT scan of the chest for now. Continue Benadryl 12.5 mg each evening Try adding loratadine (generic Claritin) 10 mg once daily. Try to avoid throat clearing if possible.  It might help to use a sugar-free candy or not methylated cough drop.  When you have the urge to clear your throat, just swallow. Flu shot today Follow with Dr Delton Coombes in 6 months or sooner if you have any problems

## 2022-07-15 NOTE — Progress Notes (Signed)
Subjective:    Patient ID: Donna Tapia, female    DOB: 02-13-1951, 71 y.o.   MRN: [id number]  HPI 71 year old woman, never smoker, with history of hyperlipidemia, allergic rhinitis, anemia, lumbar epidural abscess, psoas abscess 2013.  I saw her back in 2018 after hospitalization that included left-sided multi lobar HCAP.  Subsequent imaging showed left upper lobe predominant bronchiectasis. She is here today to reestablish care.  She has been well, no more episodes overt PNA. She has a chronic cough w a globus sensation. Clears some mucous from her throat most mornings. She uses benadryl 12.5mg  at night. Has some throat clearing through the day. She has clear nasal drainage. Occasional GERD, not every day. She does not get SOB. Good functional capacity.    Review of Systems As per HPI  Past Medical History:  Diagnosis Date   Anemia    Chicken pox    Environmental allergies    Hepatitis B 1978   Hyperlipidemia      Family History  Adopted: Yes  Problem Relation Age of Onset   Coronary artery disease Mother        Adopted mother   Parkinsonism Mother        Adopted Mother   Colon cancer Mother    Colon cancer Father    Hyperlipidemia Paternal Aunt    Hyperlipidemia Paternal Uncle    Heart disease Other        Both families   Hypertension Other      Social History   Socioeconomic History   Marital status: Single    Spouse name: Not on file   Number of children: Not on file   Years of education: Not on file   Highest education level: Not on file  Occupational History   Occupation: retired  Tobacco Use   Smoking status: Never   Smokeless tobacco: Never  Substance and Sexual Activity   Alcohol use: No   Drug use: No   Sexual activity: Never  Other Topics Concern   Not on file  Social History Narrative   Exercise- walks   Social Determinants of Health   Financial Resource Strain: Low Risk  (11/30/2019)   Overall Financial Resource Strain (CARDIA)     Difficulty of Paying Living Expenses: Not hard at all  Food Insecurity: No Food Insecurity (11/30/2019)   Hunger Vital Sign    Worried About Running Out of Food in the Last Year: Never true    Ran Out of Food in the Last Year: Never true  Transportation Needs: No Transportation Needs (11/30/2019)   PRAPARE - Administrator, Civil Service (Medical): No    Lack of Transportation (Non-Medical): No  Physical Activity: Not on file  Stress: Not on file  Social Connections: Not on file  Intimate Partner Violence: Not on file     Allergies  Allergen Reactions   Elemental Sulfur Anaphylaxis and Rash   Statins Other (See Comments)    myalgia     Outpatient Medications Prior to Visit  Medication Sig Dispense Refill   acetaminophen (TYLENOL) 325 MG tablet Take 325 mg by mouth every 4 (four) hours as needed. For pain      artificial tears (LACRILUBE) OINT ophthalmic ointment Apply to eye 4 (four) times daily. 1 Tube 0   calcium carbonate (OS-CAL - DOSED IN MG OF ELEMENTAL CALCIUM) 1250 (500 Ca) MG tablet Take 1 tablet by mouth daily with breakfast.     Cholecalciferol (VITAMIN D3) 2000 units  TABS Take 1 tablet by mouth daily.     CO ENZYME Q-10 PO Take 1 capsule by mouth daily.     ezetimibe (ZETIA) 10 MG tablet Take 1 tablet (10 mg total) by mouth daily. 90 tablet 1   folic acid (FOLVITE) 400 MCG tablet Take 400 mcg by mouth 3 (three) times a week.      Multiple Vitamin (MULITIVITAMIN WITH MINERALS) TABS Take 1 tablet by mouth daily.     Omega-3 Fatty Acids (FISH OIL) 1000 MG CAPS Take 1 capsule by mouth daily.     sertraline (ZOLOFT) 100 MG tablet Take 1 tablet (100 mg total) by mouth daily. 90 tablet 3   Vitamin D, Ergocalciferol, (DRISDOL) 1.25 MG (50000 UNIT) CAPS capsule Take 1 capsule (50,000 Units total) by mouth every 7 (seven) days. 4 capsule 4   No facility-administered medications prior to visit.        Objective:   Physical Exam Vitals:   07/15/22 1127  BP:  116/64  Pulse: 66  Temp: 98.6 F (37 C)  TempSrc: Oral  SpO2: 97%  Weight: 135 lb 9.6 oz (61.5 kg)  Height: 5\' 8"  (1.727 m)   Gen: Pleasant, well-nourished, in no distress,  normal affect  ENT: No lesions,  mouth clear,  oropharynx clear, no postnasal drip  Neck: No JVD, no stridor  Lungs: No use of accessory muscles, no crackles or wheezing on normal respiration, no wheeze on forced expiration  Cardiovascular: RRR, heart sounds normal, no murmur or gallops, no peripheral edema  Musculoskeletal: No deformities, no cyanosis or clubbing  Neuro: alert, awake, non focal  Skin: Warm, no lesions or rash       Assessment & Plan:  Chronic cough Sounds principally upper airway in nature at this juncture.  She has a globus sensation, frequent throat clearing, chronic rhinitis.  She clears mucus in the mornings but not through the day.  I do not see any evidence for bronchiectasis and impaired secretion management.  She is not currently on a flutter valve.  She denies any GERD symptoms.  I think the rhinitis is the driver here.  She is using Benadryl at night but nothing else.  We will add loratadine.  Could consider nasal steroid as well at some point in the future.  If her cough continues, becomes productive and consistent with lower airways cause then we will plan to repeat her CT chest, consider culture data.  Bronchiectasis without complication (HCC) Overall appears to be stable.  She had a chest x-ray 06/10/2022 that I reviewed that showed stable left upper lobe scar and no other abnormalities.  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  If symptoms change then we will reimage.  No clear indication for flutter valve or secretion clearance techniques at this juncture  08/10/2022, MD, PhD 07/15/2022, 11:54 AM [facility] [phone] or if no answer before 7:00PM call  [phone] For any issues after 7:00PM please call eLink [phone]

## 2022-07-15 NOTE — Assessment & Plan Note (Signed)
Sounds principally upper airway in nature at this juncture.  She has a globus sensation, frequent throat clearing, chronic rhinitis.  She clears mucus in the mornings but not through the day.  I do not see any evidence for bronchiectasis and impaired secretion management.  She is not currently on a flutter valve.  She denies any GERD symptoms.  I think the rhinitis is the driver here.  She is using Benadryl at night but nothing else.  We will add loratadine.  Could consider nasal steroid as well at some point in the future.  If her cough continues, becomes productive and consistent with lower airways cause then we will plan to repeat her CT chest, consider culture data.

## 2022-07-15 NOTE — Addendum Note (Signed)
Addended by: Dorisann Frames R on: 07/15/2022 12:31 PM   Modules accepted: Orders

## 2022-09-14 ENCOUNTER — Other Ambulatory Visit (INDEPENDENT_AMBULATORY_CARE_PROVIDER_SITE_OTHER): Payer: 59

## 2022-09-14 DIAGNOSIS — E785 Hyperlipidemia, unspecified: Secondary | ICD-10-CM | POA: Diagnosis not present

## 2022-09-14 DIAGNOSIS — E559 Vitamin D deficiency, unspecified: Secondary | ICD-10-CM | POA: Diagnosis not present

## 2022-09-14 LAB — LIPID PANEL
Cholesterol: 223 mg/dL — ABNORMAL HIGH (ref 0–200)
HDL: 41.1 mg/dL (ref >39.00)
LDL Cholesterol: 149 mg/dL — ABNORMAL HIGH (ref 0–99)
NonHDL: 182.39
Total CHOL/HDL Ratio: 5
Triglycerides: 165 mg/dL — ABNORMAL HIGH (ref 0.0–149.0)
VLDL: 33 mg/dL (ref 0.0–40.0)

## 2022-09-19 LAB — VITAMIN D 1,25 DIHYDROXY
Vitamin D 1, 25 (OH)2 Total: 50 pg/mL (ref 18–72)
Vitamin D2 1, 25 (OH)2: 10 pg/mL
Vitamin D3 1, 25 (OH)2: 40 pg/mL

## 2022-12-02 ENCOUNTER — Ambulatory Visit: Payer: 59

## 2022-12-02 ENCOUNTER — Other Ambulatory Visit: Payer: 59

## 2022-12-14 ENCOUNTER — Ambulatory Visit: Payer: 59 | Admitting: Family Medicine

## 2022-12-16 ENCOUNTER — Ambulatory Visit (INDEPENDENT_AMBULATORY_CARE_PROVIDER_SITE_OTHER): Payer: 59 | Admitting: Family Medicine

## 2022-12-16 ENCOUNTER — Encounter: Payer: Self-pay | Admitting: Family Medicine

## 2022-12-16 VITALS — BP 100/72 | HR 72 | Temp 98.5°F | Resp 18 | Ht 68.0 in | Wt 133.8 lb

## 2022-12-16 DIAGNOSIS — R5383 Other fatigue: Secondary | ICD-10-CM

## 2022-12-16 DIAGNOSIS — F418 Other specified anxiety disorders: Secondary | ICD-10-CM

## 2022-12-16 DIAGNOSIS — E785 Hyperlipidemia, unspecified: Secondary | ICD-10-CM

## 2022-12-16 DIAGNOSIS — E559 Vitamin D deficiency, unspecified: Secondary | ICD-10-CM | POA: Diagnosis not present

## 2022-12-16 LAB — COMPREHENSIVE METABOLIC PANEL
ALT: 17 U/L (ref 0–35)
AST: 24 U/L (ref 0–37)
Albumin: 4.2 g/dL (ref 3.5–5.2)
Alkaline Phosphatase: 79 U/L (ref 39–117)
BUN: 21 mg/dL (ref 6–23)
CO2: 28 mEq/L (ref 19–32)
Calcium: 9.1 mg/dL (ref 8.4–10.5)
Chloride: 104 mEq/L (ref 96–112)
Creatinine, Ser: 0.85 mg/dL (ref 0.40–1.20)
GFR: 67.77 mL/min (ref >60.00)
Glucose, Bld: 76 mg/dL (ref 70–99)
Potassium: 4.2 mEq/L (ref 3.5–5.1)
Sodium: 140 mEq/L (ref 135–145)
Total Bilirubin: 0.6 mg/dL (ref 0.2–1.2)
Total Protein: 6.8 g/dL (ref 6.0–8.3)

## 2022-12-16 LAB — LIPID PANEL
Cholesterol: 235 mg/dL — ABNORMAL HIGH (ref 0–200)
HDL: 44.5 mg/dL (ref >39.00)
LDL Cholesterol: 156 mg/dL — ABNORMAL HIGH (ref 0–99)
NonHDL: 190.91
Total CHOL/HDL Ratio: 5
Triglycerides: 174 mg/dL — ABNORMAL HIGH (ref 0.0–149.0)
VLDL: 34.8 mg/dL (ref 0.0–40.0)

## 2022-12-16 LAB — CBC WITH DIFFERENTIAL/PLATELET
Basophils Absolute: 0.1 10*3/uL (ref 0.0–0.1)
Basophils Relative: 0.8 % (ref 0.0–3.0)
Eosinophils Absolute: 0.1 10*3/uL (ref 0.0–0.7)
Eosinophils Relative: 1.7 % (ref 0.0–5.0)
HCT: 38 % (ref 36.0–46.0)
Hemoglobin: 12.9 g/dL (ref 12.0–15.0)
Lymphocytes Relative: 25.5 % (ref 12.0–46.0)
Lymphs Abs: 2.1 10*3/uL (ref 0.7–4.0)
MCHC: 34 g/dL (ref 30.0–36.0)
MCV: 93.6 fl (ref 78.0–100.0)
Monocytes Absolute: 0.5 10*3/uL (ref 0.1–1.0)
Monocytes Relative: 6 % (ref 3.0–12.0)
Neutro Abs: 5.5 10*3/uL (ref 1.4–7.7)
Neutrophils Relative %: 66 % (ref 43.0–77.0)
Platelets: 279 10*3/uL (ref 150.0–400.0)
RBC: 4.06 Mil/uL (ref 3.87–5.11)
RDW: 14 % (ref 11.5–15.5)
WBC: 8.4 10*3/uL (ref 4.0–10.5)

## 2022-12-16 LAB — VITAMIN D 25 HYDROXY (VIT D DEFICIENCY, FRACTURES): VITD: 31.45 ng/mL (ref 30.00–100.00)

## 2022-12-16 LAB — VITAMIN B12: Vitamin B-12: 365 pg/mL (ref 211–911)

## 2022-12-16 LAB — TSH: TSH: 4.03 u[IU]/mL (ref 0.35–5.50)

## 2022-12-16 NOTE — Assessment & Plan Note (Signed)
Con't zoloft  Stable

## 2022-12-16 NOTE — Patient Instructions (Signed)

## 2022-12-16 NOTE — Assessment & Plan Note (Signed)
Tolerating statin, encouraged heart healthy diet, avoid trans fats, minimize simple carbs and saturated fats. Increase exercise as tolerated 

## 2022-12-16 NOTE — Progress Notes (Addendum)
Subjective:   By signing my name below, I, [name], attest that this documentation has been prepared under the direction and in the presence of Ann Held, DO. 12/16/2022   Patient ID: Donna Tapia, female    DOB: [birth year], 72 y.o.   MRN: [id number]  Chief Complaint  Patient presents with   Hyperlipidemia   Follow-up    Hyperlipidemia Pertinent negatives include no chest pain or shortness of breath.   Patient is in today for a follow up visit.   She complains of left hip aching pain. She has a history of arthritis. She is taking tylenol to mange her pain and finds relief.  She has not taken Claritin recently and is planning on taking it again for the upcomming season change.  She does not have the latest Covid-19 vaccine. She does not have the RSV vaccine and is interested in receiving it at her pharmacy.    Past Medical History:  Diagnosis Date   Anemia    Chicken pox    Environmental allergies    Hepatitis B 1978   Hyperlipidemia     Past Surgical History:  Procedure Laterality Date   ABDOMINAL HYSTERECTOMY     APPENDECTOMY     EVISCERATION  01/07/2012   Procedure: EVISCERATION REPAIR;  Surgeon: Katy Apo, MD;  Location: [place];  Service: Ophthalmology;  Laterality: Left;  Evisceration and removal of contents left eye   LUMBAR LAMINECTOMY/DECOMPRESSION MICRODISCECTOMY  01/04/2012   Procedure: LUMBAR LAMINECTOMY/DECOMPRESSION MICRODISCECTOMY 1 LEVEL;  Surgeon: Faythe Ghee, MD;  Location: [facility];  Service: Neurosurgery;  Laterality: N/A;  Lumbar Laminectomy for Epidural Abscess   TEE WITHOUT CARDIOVERSION  01/10/2012   Procedure: TRANSESOPHAGEAL ECHOCARDIOGRAM (TEE);  Surgeon: Fay Records, MD;  Location: [facility];  Service: Cardiovascular;  Laterality: N/A;    Family History  Adopted: Yes  Problem Relation Age of Onset   Coronary artery disease Mother        Adopted mother   Parkinsonism Mother        Adopted Mother   Colon cancer  Mother    Colon cancer Father    Hyperlipidemia Paternal Aunt    Hyperlipidemia Paternal Uncle    Heart disease Other        Both families   Hypertension Other     Social History   Socioeconomic History   Marital status: Single    Spouse name: Not on file   Number of children: Not on file   Years of education: Not on file   Highest education level: Not on file  Occupational History   Occupation: retired  Tobacco Use   Smoking status: Never   Smokeless tobacco: Never  Substance and Sexual Activity   Alcohol use: No   Drug use: No   Sexual activity: Never  Other Topics Concern   Not on file  Social History Narrative   Exercise- walks   Social Determinants of Health   Financial Resource Strain: Low Risk  (11/30/2019)   Overall Financial Resource Strain (CARDIA)    Difficulty of Paying Living Expenses: Not hard at all  Food Insecurity: No Food Insecurity (11/30/2019)   Hunger Vital Sign    Worried About Running Out of Food in the Last Year: Never true    [place] in the Last Year: Never true  Transportation Needs: No Transportation Needs (11/30/2019)   PRAPARE - Transportation    Lack of Transportation (Medical): No    Lack  of Transportation (Non-Medical): No  Physical Activity: Not on file  Stress: Not on file  Social Connections: Not on file  Intimate Partner Violence: Not on file    Outpatient Medications Prior to Visit  Medication Sig Dispense Refill   acetaminophen (TYLENOL) 325 MG tablet Take 325 mg by mouth every 4 (four) hours as needed. For pain      artificial tears (LACRILUBE) OINT ophthalmic ointment Apply to eye 4 (four) times daily. 1 Tube 0   calcium carbonate (OS-CAL - DOSED IN MG OF ELEMENTAL CALCIUM) 1250 (500 Ca) MG tablet Take 1 tablet by mouth daily with breakfast.     Cholecalciferol (VITAMIN D3) 2000 units TABS Take 1 tablet by mouth daily.     CO ENZYME Q-10 PO Take 1 capsule by mouth daily.     folic acid (FOLVITE) A999333 MCG tablet Take  400 mcg by mouth 3 (three) times a week.      loratadine (CLARITIN) 10 MG tablet Take 1 tablet (10 mg total) by mouth daily. 30 tablet 5   Multiple Vitamin (MULITIVITAMIN WITH MINERALS) TABS Take 1 tablet by mouth daily.     Omega-3 Fatty Acids (FISH OIL) 1000 MG CAPS Take 1 capsule by mouth daily.     sertraline (ZOLOFT) 100 MG tablet Take 1 tablet (100 mg total) by mouth daily. 90 tablet 3   Vitamin D, Ergocalciferol, (DRISDOL) 1.25 MG (50000 UNIT) CAPS capsule Take 1 capsule (50,000 Units total) by mouth every 7 (seven) days. 4 capsule 4   ezetimibe (ZETIA) 10 MG tablet Take 1 tablet (10 mg total) by mouth daily. (Patient not taking: Reported on 12/16/2022) 90 tablet 1   No facility-administered medications prior to visit.    Allergies  Allergen Reactions   Elemental Sulfur Anaphylaxis and Rash   Statins Other (See Comments)    myalgia    Review of Systems  Constitutional:  Negative for fever and malaise/fatigue.  HENT:  Negative for congestion.   Eyes:  Negative for blurred vision.  Respiratory:  Negative for shortness of breath.   Cardiovascular:  Negative for chest pain, palpitations and leg swelling.  Gastrointestinal:  Negative for abdominal pain, blood in stool and nausea.  Genitourinary:  Negative for dysuria and frequency.  Musculoskeletal:  Negative for falls.  Skin:  Negative for rash.  Neurological:  Negative for dizziness, loss of consciousness and headaches.  Endo/Heme/Allergies:  Negative for environmental allergies.  Psychiatric/Behavioral:  Negative for depression. The patient is not nervous/anxious.        Objective:    Physical Exam Constitutional:      General: She is not in acute distress.    Appearance: Normal appearance. She is not ill-appearing.  HENT:     Head: Normocephalic and atraumatic.     Right Ear: External ear normal.     Left Ear: External ear normal.  Eyes:     Extraocular Movements: Extraocular movements intact.     Pupils: Pupils are  equal, round, and reactive to light.  Cardiovascular:     Rate and Rhythm: Normal rate and regular rhythm.     Heart sounds: Normal heart sounds. No murmur heard.    No gallop.  Pulmonary:     Effort: Pulmonary effort is normal. No respiratory distress.     Breath sounds: Normal breath sounds. No wheezing or rales.  Skin:    General: Skin is warm and dry.  Neurological:     Mental Status: She is alert and oriented to person, place,  and time.  Psychiatric:        Judgment: Judgment normal.     BP 100/72 (BP Location: Right Arm, Patient Position: Sitting, Cuff Size: Normal)   Pulse 72   Temp 98.5 F (36.9 C) (Oral)   Resp 18   Ht 5' 8"$  (1.727 m)   Wt 133 lb 12.8 oz (60.7 kg)   SpO2 97%   BMI 20.34 kg/m  Wt Readings from Last 3 Encounters:  12/16/22 133 lb 12.8 oz (60.7 kg)  07/15/22 135 lb 9.6 oz (61.5 kg)  06/10/22 135 lb 3.2 oz (61.3 kg)       Assessment & Plan:  Hyperlipidemia, unspecified hyperlipidemia type Assessment & Plan: Tolerating statin, encouraged heart healthy diet, avoid trans fats, minimize simple carbs and saturated fats. Increase exercise as tolerated   Orders: -     CBC with Differential/Platelet -     Comprehensive metabolic panel -     Lipid panel -     TSH -     Iron, TIBC and Ferritin Panel -     TSH -     VITAMIN D 25 Hydroxy (Vit-D Deficiency, Fractures) -     Vitamin B12  Vitamin D deficiency -     CBC with Differential/Platelet -     Comprehensive metabolic panel -     Lipid panel -     TSH -     Iron, TIBC and Ferritin Panel -     TSH -     VITAMIN D 25 Hydroxy (Vit-D Deficiency, Fractures) -     Vitamin B12  Depression with anxiety Assessment & Plan: Con't zoloft  Stable   Orders: -     CBC with Differential/Platelet -     Comprehensive metabolic panel -     Lipid panel -     TSH -     Iron, TIBC and Ferritin Panel -     TSH -     VITAMIN D 25 Hydroxy (Vit-D Deficiency, Fractures) -     Vitamin B12  Other fatigue -      CBC with Differential/Platelet -     Comprehensive metabolic panel -     Lipid panel -     TSH -     Iron, TIBC and Ferritin Panel -     TSH -     VITAMIN D 25 Hydroxy (Vit-D Deficiency, Fractures) -     Vitamin B12    I, Ann Held, DO, personally preformed the services described in this documentation.  All medical record entries made by the scribe were at my direction and in my presence.  I have reviewed the chart and discharge instructions (if applicable) and agree that the record reflects my personal performance and is accurate and complete. 12/16/2022   I,[name],acting as a scribe for Ann Held, DO.,have documented all relevant documentation on the behalf of Ann Held, DO,as directed by  Ann Held, DO while in the presence of Ann Held, DO.   Ann Held, DO

## 2022-12-17 LAB — IRON,TIBC AND FERRITIN PANEL
%SAT: 28 % (calc) (ref 16–45)
Ferritin: 106 ng/mL (ref 16–288)
Iron: 100 ug/dL (ref 45–160)
TIBC: 359 mcg/dL (calc) (ref 250–450)

## 2022-12-22 ENCOUNTER — Other Ambulatory Visit: Payer: Self-pay | Admitting: Family Medicine

## 2022-12-22 DIAGNOSIS — E785 Hyperlipidemia, unspecified: Secondary | ICD-10-CM

## 2022-12-23 ENCOUNTER — Other Ambulatory Visit: Payer: Self-pay

## 2022-12-23 ENCOUNTER — Telehealth: Payer: Self-pay

## 2022-12-23 NOTE — Telephone Encounter (Signed)
done 

## 2022-12-30 ENCOUNTER — Telehealth: Payer: Self-pay | Admitting: Family Medicine

## 2022-12-30 NOTE — Telephone Encounter (Signed)
Letter mailed

## 2022-12-30 NOTE — Telephone Encounter (Signed)
Pt requesting lab order results to be mailed. She never received.

## 2023-03-24 ENCOUNTER — Other Ambulatory Visit: Payer: 59

## 2023-04-15 ENCOUNTER — Other Ambulatory Visit: Payer: Self-pay | Admitting: Family Medicine

## 2023-04-15 DIAGNOSIS — E559 Vitamin D deficiency, unspecified: Secondary | ICD-10-CM

## 2023-04-21 ENCOUNTER — Encounter: Payer: Self-pay | Admitting: Family Medicine

## 2023-04-21 ENCOUNTER — Ambulatory Visit (INDEPENDENT_AMBULATORY_CARE_PROVIDER_SITE_OTHER): Payer: 59 | Admitting: Family Medicine

## 2023-04-21 ENCOUNTER — Ambulatory Visit (HOSPITAL_BASED_OUTPATIENT_CLINIC_OR_DEPARTMENT_OTHER)
Admission: RE | Admit: 2023-04-21 | Discharge: 2023-04-21 | Disposition: A | Discharge status: Home / Self Care | Payer: 59 | Source: Ambulatory Visit | Attending: Family Medicine | Admitting: Family Medicine

## 2023-04-21 VITALS — BP 98/60 | HR 102 | Temp 99.6°F | Resp 18 | Ht 68.0 in | Wt 128.2 lb

## 2023-04-21 DIAGNOSIS — J209 Acute bronchitis, unspecified: Secondary | ICD-10-CM

## 2023-04-21 DIAGNOSIS — Z862 Personal history of diseases of the blood and blood-forming organs and certain disorders involving the immune mechanism: Secondary | ICD-10-CM | POA: Diagnosis not present

## 2023-04-21 DIAGNOSIS — E559 Vitamin D deficiency, unspecified: Secondary | ICD-10-CM

## 2023-04-21 DIAGNOSIS — E785 Hyperlipidemia, unspecified: Secondary | ICD-10-CM | POA: Diagnosis not present

## 2023-04-21 DIAGNOSIS — R5383 Other fatigue: Secondary | ICD-10-CM | POA: Diagnosis not present

## 2023-04-21 DIAGNOSIS — J440 Chronic obstructive pulmonary disease with acute lower respiratory infection: Secondary | ICD-10-CM | POA: Diagnosis not present

## 2023-04-21 DIAGNOSIS — R051 Acute cough: Secondary | ICD-10-CM

## 2023-04-21 LAB — VITAMIN B12: Vitamin B-12: >1500 pg/mL — ABNORMAL HIGH (ref 211–911)

## 2023-04-21 LAB — COMPREHENSIVE METABOLIC PANEL
ALT: 59 U/L — ABNORMAL HIGH (ref 0–35)
AST: 43 U/L — ABNORMAL HIGH (ref 0–37)
Albumin: 3.7 g/dL (ref 3.5–5.2)
Alkaline Phosphatase: 158 U/L — ABNORMAL HIGH (ref 39–117)
BUN: 22 mg/dL (ref 6–23)
CO2: 22 mEq/L (ref 19–32)
Calcium: 9 mg/dL (ref 8.4–10.5)
Chloride: 100 mEq/L (ref 96–112)
Creatinine, Ser: 1.04 mg/dL (ref 0.40–1.20)
GFR: 53.07 mL/min — ABNORMAL LOW (ref >60.00)
Glucose, Bld: 96 mg/dL (ref 70–99)
Potassium: 4.2 mEq/L (ref 3.5–5.1)
Sodium: 137 mEq/L (ref 135–145)
Total Bilirubin: 1.1 mg/dL (ref 0.2–1.2)
Total Protein: 6.9 g/dL (ref 6.0–8.3)

## 2023-04-21 LAB — CBC WITH DIFFERENTIAL/PLATELET
Basophils Absolute: 0 10*3/uL (ref 0.0–0.1)
Basophils Relative: 0.3 % (ref 0.0–3.0)
Eosinophils Absolute: 0 10*3/uL (ref 0.0–0.7)
Eosinophils Relative: 0.2 % (ref 0.0–5.0)
HCT: 32.3 % — ABNORMAL LOW (ref 36.0–46.0)
Hemoglobin: 10.5 g/dL — ABNORMAL LOW (ref 12.0–15.0)
Lymphocytes Relative: 4 % — ABNORMAL LOW (ref 12.0–46.0)
Lymphs Abs: 0.7 10*3/uL (ref 0.7–4.0)
MCHC: 32.5 g/dL (ref 30.0–36.0)
MCV: 95.5 fl (ref 78.0–100.0)
Monocytes Absolute: 1.2 10*3/uL — ABNORMAL HIGH (ref 0.1–1.0)
Monocytes Relative: 7.2 % (ref 3.0–12.0)
Neutro Abs: 14.9 10*3/uL — ABNORMAL HIGH (ref 1.4–7.7)
Neutrophils Relative %: 88.3 % — ABNORMAL HIGH (ref 43.0–77.0)
Platelets: 316 10*3/uL (ref 150.0–400.0)
RBC: 3.38 Mil/uL — ABNORMAL LOW (ref 3.87–5.11)
RDW: 13.7 % (ref 11.5–15.5)
WBC: 16.8 10*3/uL — ABNORMAL HIGH (ref 4.0–10.5)

## 2023-04-21 LAB — TSH: TSH: 2.36 u[IU]/mL (ref 0.35–5.50)

## 2023-04-21 LAB — LIPID PANEL
Cholesterol: 137 mg/dL (ref 0–200)
HDL: 34.7 mg/dL — ABNORMAL LOW (ref >39.00)
LDL Cholesterol: 84 mg/dL (ref 0–99)
NonHDL: 101.85
Total CHOL/HDL Ratio: 4
Triglycerides: 87 mg/dL (ref 0.0–149.0)
VLDL: 17.4 mg/dL (ref 0.0–40.0)

## 2023-04-21 LAB — VITAMIN D 25 HYDROXY (VIT D DEFICIENCY, FRACTURES): VITD: 66.21 ng/mL (ref 30.00–100.00)

## 2023-04-21 MED ORDER — AZITHROMYCIN 250 MG PO TABS
ORAL_TABLET | ORAL | 0 refills | Status: DC
Start: 2023-04-21 — End: 2023-06-14

## 2023-04-21 MED ORDER — PROMETHAZINE-DM 6.25-15 MG/5ML PO SYRP
5.0000 mL | ORAL_SOLUTION | Freq: Four times a day (QID) | ORAL | 0 refills | Status: DC | PRN
Start: 2023-04-21 — End: 2023-06-14

## 2023-04-21 MED ORDER — METHYLPREDNISOLONE ACETATE 80 MG/ML IJ SUSP
80.0000 mg | Freq: Once | INTRAMUSCULAR | Status: AC
Start: 2023-04-21 — End: 2023-04-21
  Administered 2023-04-21: 80 mg via INTRAMUSCULAR

## 2023-04-21 NOTE — Progress Notes (Signed)
Established Patient Office Visit  Subjective   Patient ID: Donna Tapia, female    DOB: April 26, 1951  Age: 72 y.o. MRN: [id number]  Chief Complaint  Patient presents with   Cough    Sxs started over the weekend. No COVID test. Pt states having productive cough, no appetite, fatigue and some nausea. No sore throat.    HPI Discussed the use of AI scribe software for clinical note transcription with the patient, who gave verbal consent to proceed.  History of Present Illness   The patient, with a history of sinus issues, presents with a cough and phlegm production that began after exposure to aerosol spray. She reports a sensation of 'lung issues' and a 'little bit of coughing' the day after exposure, which progressed to significant phlegm production by the following day. She has had difficulty eating due to altered taste and difficulty swallowing, but deny sore throat. She reports a low-grade fever and fatigue, attributing the latter to the effort of coughing. She has been taking Zyrtec, cold and flu medication, and Zoloft. She also report taking acetaminophen for fever.  In addition to the respiratory symptoms, the patient mentions a history of pneumonia and a recent tooth extraction, for which she was prescribed an antibiotic that she tolerated well, but developed a rash a week after completing the course.      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;  Service: Ophthalmology;  Laterality: Left;  Evisceration and removal of contents left eye   LUMBAR LAMINECTOMY/DECOMPRESSION MICRODISCECTOMY  01/04/2012   Procedure: LUMBAR LAMINECTOMY/DECOMPRESSION MICRODISCECTOMY 1 LEVEL;  Surgeon: Reinaldo Meeker, MD;  Location: [facility];  Service: Neurosurgery;  Laterality: N/A;  Lumbar Laminectomy for Epidural Abscess   TEE WITHOUT CARDIOVERSION  01/10/2012   Procedure: TRANSESOPHAGEAL ECHOCARDIOGRAM (TEE);  Surgeon: Pricilla Riffle, MD;  Location: [facility];  Service: Cardiovascular;  Laterality: N/A;   Social History   Tobacco Use   Smoking status: Never   Smokeless tobacco: Never  Substance Use Topics   Alcohol use: No   Drug use: No   Social History   Socioeconomic History   Marital status: Single    Spouse name: Not on file   Number of children: Not on file   Years of education: Not on file   Highest education level: Not on file  Occupational History   Occupation: retired  Tobacco Use   Smoking status: Never   Smokeless tobacco: Never  Substance and Sexual Activity   Alcohol use: No  Drug use: No   Sexual activity: Never  Other Topics Concern   Not on file  Social History Narrative   Exercise- walks   Social Determinants of Health   Financial Resource Strain: Low Risk  (11/30/2019)   Overall Financial Resource Strain (CARDIA)    Difficulty of Paying Living Expenses: Not hard at all  Food Insecurity: No Food Insecurity (11/30/2019)   Hunger Vital Sign    Worried About Running Out of Food in the Last Year: Never true    Ran Out of Food in the Last  Year: Never true  Transportation Needs: No Transportation Needs (11/30/2019)   PRAPARE - Administrator, Civil Service (Medical): No    Lack of Transportation (Non-Medical): No  Physical Activity: Not on file  Stress: Not on file  Social Connections: Not on file  Intimate Partner Violence: Not on file   Family Status  Relation Name Status   Mother  (Not Specified)   Father  (Not Specified)   Emelda Brothers  (Not Specified)   Oneal Grout  (Not Specified)   Other  (Not Specified)   Other  (Not Specified)   Family History  Adopted: Yes  Problem Relation Age of Onset   Coronary artery disease Mother        Adopted mother   Parkinsonism Mother        Adopted Mother   Colon cancer Mother    Colon cancer Father    Hyperlipidemia Paternal Aunt    Hyperlipidemia Paternal Uncle    Heart disease Other        Both families   Hypertension Other    Allergies  Allergen Reactions   Elemental Sulfur Anaphylaxis and Rash   Statins Other (See Comments)    myalgia      Review of Systems  Constitutional:  Positive for chills, fever and malaise/fatigue.  HENT:  Positive for congestion, sinus pain and sore throat.   Eyes:  Negative for blurred vision.  Respiratory:  Positive for cough and sputum production. Negative for shortness of breath.   Cardiovascular:  Negative for chest pain, palpitations and leg swelling.  Gastrointestinal:  Negative for abdominal pain, blood in stool and nausea.  Genitourinary:  Negative for dysuria and frequency.  Musculoskeletal:  Negative for falls.  Skin:  Negative for rash.  Neurological:  Negative for dizziness, loss of consciousness and headaches.  Endo/Heme/Allergies:  Negative for environmental allergies.  Psychiatric/Behavioral:  Negative for depression. The patient is not nervous/anxious.       Objective:     BP 98/60 (BP Location: Left Arm, Patient Position: Sitting, Cuff Size: Normal)   Pulse (!) 102   Temp 99.6 F (37.6 C) (Oral)    Resp 18   Ht 5\' 8"  (1.727 m)   Wt 128 lb 3.2 oz (58.2 kg)   SpO2 94%   BMI 19.49 kg/m  BP Readings from Last 3 Encounters:  04/21/23 98/60  12/16/22 100/72  07/15/22 116/64   Wt Readings from Last 3 Encounters:  04/21/23 128 lb 3.2 oz (58.2 kg)  12/16/22 133 lb 12.8 oz (60.7 kg)  07/15/22 135 lb 9.6 oz (61.5 kg)   SpO2 Readings from Last 3 Encounters:  04/21/23 94%  12/16/22 97%  07/15/22 97%      Physical Exam Vitals and nursing note reviewed.  Constitutional:      Appearance: She is well-developed.  HENT:     Head: Normocephalic and atraumatic.  Eyes:     Conjunctiva/sclera: Conjunctivae  normal.  Neck:     Thyroid: No thyromegaly.     Vascular: No carotid bruit or JVD.  Cardiovascular:     Rate and Rhythm: Normal rate and regular rhythm.     Heart sounds: Normal heart sounds. No murmur heard. Pulmonary:     Effort: Pulmonary effort is normal. No respiratory distress.     Breath sounds: Normal breath sounds. No wheezing or rales.  Chest:     Chest wall: No tenderness.  Musculoskeletal:     Cervical back: Normal range of motion and neck supple.  Neurological:     Mental Status: She is alert and oriented to person, place, and time.      No results found for any visits on 04/21/23.  Last CBC Lab Results  Component Value Date   WBC 8.4 12/16/2022   HGB 12.9 12/16/2022   HCT 38.0 12/16/2022   MCV 93.6 12/16/2022   MCH 29.7 01/17/2012   RDW 14.0 12/16/2022   PLT 279.0 12/16/2022   Last metabolic panel Lab Results  Component Value Date   GLUCOSE 76 12/16/2022   NA 140 12/16/2022   K 4.2 12/16/2022   CL 104 12/16/2022   CO2 28 12/16/2022   BUN 21 12/16/2022   CREATININE 0.85 12/16/2022   CALCIUM 9.1 12/16/2022   PROT 6.8 12/16/2022   ALBUMIN 4.2 12/16/2022   BILITOT 0.6 12/16/2022   ALKPHOS 79 12/16/2022   AST 24 12/16/2022   ALT 17 12/16/2022   Last lipids Lab Results  Component Value Date   CHOL 235 (H) 12/16/2022   HDL 44.50 12/16/2022    LDLCALC 156 (H) 12/16/2022   LDLDIRECT 187.0 06/10/2022   TRIG 174.0 (H) 12/16/2022   CHOLHDL 5 12/16/2022   Last hemoglobin A1c No results found for: "HGBA1C" Last thyroid functions Lab Results  Component Value Date   TSH 4.03 12/16/2022   Last vitamin D Lab Results  Component Value Date   VD25OH 31.45 12/16/2022   Last vitamin B12 and Folate Lab Results  Component Value Date   VITAMINB12 365 12/16/2022   FOLATE 8.7 12/31/2011      The 10-year ASCVD risk score (Arnett DK, et al., 2019) is: 9.1%    Assessment & Plan:   Problem List Items Addressed This Visit       Unprioritized   History of anemia   Relevant Orders   CBC with Differential/Platelet   Hyperlipidemia   Relevant Orders   Comprehensive metabolic panel   Lipid panel   Other Visit Diagnoses     Acute cough    -  Primary   Relevant Medications   azithromycin (ZITHROMAX Z-PAK) 250 MG tablet   promethazine-dextromethorphan (PROMETHAZINE-DM) 6.25-15 MG/5ML syrup   Other Relevant Orders   POC COVID-19   Acute bronchitis with COPD (HCC)       Relevant Medications   azithromycin (ZITHROMAX Z-PAK) 250 MG tablet   promethazine-dextromethorphan (PROMETHAZINE-DM) 6.25-15 MG/5ML syrup   methylPREDNISolone acetate (DEPO-MEDROL) injection 80 mg (Completed)   Other Relevant Orders   DG Chest 2 View (Completed)   Vitamin D deficiency       Relevant Orders   VITAMIN D 25 Hydroxy (Vit-D Deficiency, Fractures)   Other fatigue       Relevant Orders   CBC with Differential/Platelet   Comprehensive metabolic panel   TSH   VITAMIN D 25 Hydroxy (Vit-D Deficiency, Fractures)   Vitamin B12     Assessment and Plan    Acute Sinusitis: Chronic sinus issues exacerbated  by recent exposure to aerosol spray. Coughing, phlegm production, and low-grade fever reported. Decreased breath sounds noted on examination. -Order chest x-ray to rule out pneumonia. -Prescribe Azithromycin (Z-Pak) for possible bacterial  infection. -Recommend over-the-counter Mucinex to thin mucus and facilitate expectoration. -Prescribe cough syrup for nighttime use to aid rest.  General Health Maintenance: -Check Vitamin D levels as patient has been on high-dose supplementation and has recently switched to over-the-counter Vitamin D3. -Complete additional lab work as requested by patient to assess overall health status.        Return if symptoms worsen or fail to improve.    [name], DO

## 2023-04-21 NOTE — Patient Instructions (Signed)

## 2023-04-22 ENCOUNTER — Other Ambulatory Visit: Payer: Self-pay

## 2023-04-22 MED ORDER — AMOXICILLIN-POT CLAVULANATE 875-125 MG PO TABS: 1.0000 | ORAL_TABLET | Freq: Two times a day (BID) | ORAL | 0 refills | Status: DC

## 2023-04-25 LAB — POC COVID19 BINAXNOW: SARS Coronavirus 2 Ag: NEGATIVE

## 2023-04-26 ENCOUNTER — Telehealth: Payer: Self-pay | Admitting: Family Medicine

## 2023-04-26 NOTE — Telephone Encounter (Signed)
Pt unable to swallow the amoxicillin-clavulanate (AUGMENTIN) 875-125 MG tablet and it also makes her stomach hurt. She would like to know if she can get a refill on the Azithromycin since she tolerated that well. Please send to [facility] on [address]. Please call to advise.

## 2023-04-27 MED ORDER — CEFUROXIME AXETIL 500 MG PO TABS: 500.0000 mg | ORAL_TABLET | Freq: Two times a day (BID) | ORAL | 0 refills | Status: AC

## 2023-04-27 NOTE — Addendum Note (Signed)
Addended by: Roxanne Gates on: 04/27/2023 01:34 PM   Modules accepted: Orders

## 2023-04-27 NOTE — Telephone Encounter (Signed)
Rx sent in. Pt made aware

## 2023-04-27 NOTE — Telephone Encounter (Signed)
Pt called to follow up to find out what the delay is on the medication change for the antibiotic. Advised that Dr Laury Axon responded after hours so the CMA may not have had a chance to send in prescription yet.Pt  said she called about 2 hours ago and was told something would be sent in soon but the pharmacy has not received yet. Please call pt to advise

## 2023-05-02 ENCOUNTER — Other Ambulatory Visit: Payer: Self-pay | Admitting: Family Medicine

## 2023-05-02 DIAGNOSIS — E2839 Other primary ovarian failure: Secondary | ICD-10-CM

## 2023-05-03 ENCOUNTER — Other Ambulatory Visit: Payer: 59

## 2023-05-03 ENCOUNTER — Ambulatory Visit: Payer: 59

## 2023-05-09 ENCOUNTER — Other Ambulatory Visit: Payer: Self-pay | Admitting: Family Medicine

## 2023-05-09 ENCOUNTER — Telehealth: Payer: Self-pay | Admitting: Family Medicine

## 2023-05-09 DIAGNOSIS — J189 Pneumonia, unspecified organism: Secondary | ICD-10-CM

## 2023-05-09 DIAGNOSIS — J209 Acute bronchitis, unspecified: Secondary | ICD-10-CM

## 2023-05-09 NOTE — Telephone Encounter (Signed)
Pt called stating that she would like to have a follow up xray to check her lungs after her antibiotic. Pt has finished the course of medication.

## 2023-05-10 ENCOUNTER — Ambulatory Visit (HOSPITAL_BASED_OUTPATIENT_CLINIC_OR_DEPARTMENT_OTHER)
Admission: RE | Admit: 2023-05-10 | Discharge: 2023-05-10 | Disposition: A | Discharge status: Home / Self Care | Payer: 59 | Source: Ambulatory Visit | Attending: Family Medicine | Admitting: Family Medicine

## 2023-05-10 DIAGNOSIS — J189 Pneumonia, unspecified organism: Secondary | ICD-10-CM | POA: Diagnosis not present

## 2023-05-10 NOTE — Telephone Encounter (Signed)
Pt called. LDVM. Imaging ordered

## 2023-05-27 ENCOUNTER — Telehealth: Payer: 59 | Admitting: Family Medicine

## 2023-05-27 ENCOUNTER — Telehealth: Payer: Self-pay | Admitting: Family Medicine

## 2023-05-27 ENCOUNTER — Encounter: Payer: Self-pay | Admitting: Family Medicine

## 2023-05-27 DIAGNOSIS — U071 COVID-19: Secondary | ICD-10-CM | POA: Diagnosis not present

## 2023-05-27 MED ORDER — NIRMATRELVIR/RITONAVIR (PAXLOVID)TABLET
3.0000 | ORAL_TABLET | Freq: Two times a day (BID) | ORAL | 0 refills | Status: AC
Start: 2023-05-27 — End: 2023-06-01

## 2023-05-27 NOTE — Telephone Encounter (Signed)
Pt called to inform us she tested positive for Covid today. Her symptoms are scratchy throat, headache, tiredness/weakness, and runny nose. She requested to have something sent in since there are not any openings available & she refused to go to another location. The pharmacy the pt is using is [id number] [place], [place] - [address] AT [facility] [address], [place] [address] Phone: [phone]  Fax: [phone]. Please advise pt.

## 2023-05-27 NOTE — Progress Notes (Signed)
Established Patient Office Visit  Subjective   Patient ID: Donna Tapia, female    DOB: 27-Sep-1951  Age: 72 y.o. MRN: [id number]  No chief complaint on file.   HPI Discussed the use of AI scribe software for clinical note transcription with the patient, who gave verbal consent to proceed.  History of Present Illness   The patient presents with a low-grade fever, hoarseness, a mild cough, and generalized weakness. These symptoms began yesterday evening and have persisted. He also reports feeling 'washed out.' The patient does not report any body aches. He has tested positive for an unspecified condition today.   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   Lobar pneumonia (HCC) 01/01/2012   Renal failure, acute (HCC) 12/31/2011   Past Medical History:  Diagnosis Date   Anemia    Chicken pox    Environmental allergies    Hepatitis B 1978   Hyperlipidemia    Past Surgical History:  Procedure Laterality Date   ABDOMINAL HYSTERECTOMY     APPENDECTOMY     EVISCERATION  01/07/2012   Procedure: EVISCERATION REPAIR;  Surgeon: Antony Contras, MD;  Location: [facility] OR;  Service: Ophthalmology;  Laterality: Left;  Evisceration and removal of contents left eye   LUMBAR LAMINECTOMY/DECOMPRESSION MICRODISCECTOMY  01/04/2012   Procedure: LUMBAR LAMINECTOMY/DECOMPRESSION MICRODISCECTOMY 1 LEVEL;  Surgeon: Reinaldo Meeker, MD;  Location: [facility]UR[facility] ORS;  Service: Neurosurgery;   Laterality: N/A;  Lumbar Laminectomy for Epidural Abscess   TEE WITHOUT CARDIOVERSION  01/10/2012   Procedure: TRANSESOPHAGEAL ECHOCARDIOGRAM (TEE);  Surgeon: Pricilla Riffle, MD;  Location: [facility] ENDOSCOPY;  Service: Cardiovascular;  Laterality: N/A;   Social History   Tobacco Use   Smoking status: Never   Smokeless tobacco: Never  Substance Use Topics   Alcohol use: No   Drug use: No   Social History   Socioeconomic History   Marital status: Single    Spouse name: Not on file   Number of children: Not on file   Years of education: Not on file   Highest education level: Not on file  Occupational History   Occupation: retired  Tobacco Use   Smoking status: Never   Smokeless tobacco: Never  Substance and Sexual Activity   Alcohol use: No   Drug use: No   Sexual activity: Never  Other Topics Concern   Not on file  Social History Narrative   Exercise- walks   Social Determinants of Health   Financial Resource Strain: Low Risk  (11/30/2019)   Overall Financial Resource Strain (CARDIA)    Difficulty of Paying Living Expenses: Not hard at all  Food Insecurity: No Food Insecurity (11/30/2019)   Hunger Vital Sign    Worried About Running Out of Food in the Last Year: Never true    Ran Out of Food in the Last Year: Never true  Transportation Needs: No Transportation Needs (11/30/2019)   PRAPARE - Transportation    Lack of  Transportation (Medical): No    Lack of Transportation (Non-Medical): No  Physical Activity: Not on file  Stress: Not on file  Social Connections: Not on file  Intimate Partner Violence: Not on file   Family Status  Relation Name Status   Mother  (Not Specified)   Father  (Not Specified)   Emelda Brothers  (Not Specified)   Oneal Grout  (Not Specified)   Other  (Not Specified)   Other  (Not Specified)  No partnership data on file   Family History  Adopted: Yes  Problem Relation Age of Onset   Coronary artery disease Mother        Adopted mother    Parkinsonism Mother        Adopted Mother   Colon cancer Mother    Colon cancer Father    Hyperlipidemia Paternal Aunt    Hyperlipidemia Paternal Uncle    Heart disease Other        Both families   Hypertension Other    Allergies  Allergen Reactions   Elemental Sulfur Anaphylaxis and Rash   Statins Other (See Comments)    myalgia      Review of Systems  Constitutional:  Negative for fever and malaise/fatigue.  HENT:  Negative for congestion.   Eyes:  Negative for blurred vision.  Respiratory:  Negative for cough and shortness of breath.   Cardiovascular:  Negative for chest pain, palpitations and leg swelling.  Gastrointestinal:  Negative for vomiting.  Musculoskeletal:  Negative for back pain.  Skin:  Negative for rash.  Neurological:  Negative for loss of consciousness and headaches.      Objective:     There were no vitals taken for this visit. BP Readings from Last 3 Encounters:  04/21/23 98/60  12/16/22 100/72  07/15/22 116/64   Wt Readings from Last 3 Encounters:  04/21/23 128 lb 3.2 oz (58.2 kg)  12/16/22 133 lb 12.8 oz (60.7 kg)  07/15/22 135 lb 9.6 oz (61.5 kg)   SpO2 Readings from Last 3 Encounters:  04/21/23 94%  12/16/22 97%  07/15/22 97%      Physical Exam Vitals and nursing note reviewed.  Constitutional:      General: She is not in acute distress.    Appearance: Normal appearance. She is well-developed.  HENT:     Head: Normocephalic and atraumatic.  Pulmonary:     Effort: Pulmonary effort is normal. No respiratory distress.  Neurological:     Mental Status: She is alert and oriented to person, place, and time.  Psychiatric:        Mood and Affect: Mood normal.        Behavior: Behavior normal.        Thought Content: Thought content normal.        Judgment: Judgment normal.      No results found for any visits on 05/27/23.    The 10-year ASCVD risk score (Arnett DK, et al., 2019) is: 8.5%    Assessment & Plan:   Problem  List Items Addressed This Visit   None Visit Diagnoses     COVID-19    -  Primary   Relevant Medications   nirmatrelvir/ritonavir (PAXLOVID) 20 x 150 MG & 10 x 100MG  TABS     Assessment and Plan    COVID-19: Positive test with mild symptoms including low-grade fever, hoarseness, mild cough, and general weakness. No severe symptoms reported. -Prescribed medication (unspecified in conversation) for a duration of 5 days. -Advised to call  the office next week for follow-up or sooner if symptoms worsen.        No follow-ups on file.    [name], DO

## 2023-06-03 ENCOUNTER — Other Ambulatory Visit: Payer: Self-pay | Admitting: Family Medicine

## 2023-06-03 DIAGNOSIS — F418 Other specified anxiety disorders: Secondary | ICD-10-CM

## 2023-06-14 ENCOUNTER — Encounter: Payer: Self-pay | Admitting: Family Medicine

## 2023-06-14 ENCOUNTER — Ambulatory Visit (INDEPENDENT_AMBULATORY_CARE_PROVIDER_SITE_OTHER): Payer: 59 | Admitting: Family Medicine

## 2023-06-14 VITALS — BP 100/64 | HR 70 | Temp 98.1°F | Resp 18 | Ht 68.0 in | Wt 124.4 lb

## 2023-06-14 DIAGNOSIS — E785 Hyperlipidemia, unspecified: Secondary | ICD-10-CM

## 2023-06-14 DIAGNOSIS — Z0000 Encounter for general adult medical examination without abnormal findings: Secondary | ICD-10-CM

## 2023-06-14 DIAGNOSIS — F418 Other specified anxiety disorders: Secondary | ICD-10-CM | POA: Diagnosis not present

## 2023-06-14 DIAGNOSIS — D72829 Elevated white blood cell count, unspecified: Secondary | ICD-10-CM | POA: Diagnosis not present

## 2023-06-14 DIAGNOSIS — Z23 Encounter for immunization: Secondary | ICD-10-CM | POA: Diagnosis not present

## 2023-06-14 DIAGNOSIS — D649 Anemia, unspecified: Secondary | ICD-10-CM | POA: Diagnosis not present

## 2023-06-14 LAB — CBC WITH DIFFERENTIAL/PLATELET
Basophils Absolute: 0 10*3/uL (ref 0.0–0.1)
Basophils Relative: 0.4 % (ref 0.0–3.0)
Eosinophils Absolute: 0.2 10*3/uL (ref 0.0–0.7)
Eosinophils Relative: 2.7 % (ref 0.0–5.0)
HCT: 36.9 % (ref 36.0–46.0)
Hemoglobin: 12.1 g/dL (ref 12.0–15.0)
Lymphocytes Relative: 24.1 % (ref 12.0–46.0)
Lymphs Abs: 2 10*3/uL (ref 0.7–4.0)
MCHC: 32.9 g/dL (ref 30.0–36.0)
MCV: 93.4 fl (ref 78.0–100.0)
Monocytes Absolute: 0.6 10*3/uL (ref 0.1–1.0)
Monocytes Relative: 7.5 % (ref 3.0–12.0)
Neutro Abs: 5.5 10*3/uL (ref 1.4–7.7)
Neutrophils Relative %: 65.3 % (ref 43.0–77.0)
Platelets: 283 10*3/uL (ref 150.0–400.0)
RBC: 3.95 Mil/uL (ref 3.87–5.11)
RDW: 15.1 % (ref 11.5–15.5)
WBC: 8.4 10*3/uL (ref 4.0–10.5)

## 2023-06-14 LAB — IBC PANEL
Iron: 87 ug/dL (ref 42–145)
Saturation Ratios: 23.5 % (ref 20.0–50.0)
TIBC: 369.6 ug/dL (ref 250.0–450.0)
Transferrin: 264 mg/dL (ref 212.0–360.0)

## 2023-06-14 MED ORDER — SERTRALINE HCL 100 MG PO TABS
100.0000 mg | ORAL_TABLET | Freq: Every day | ORAL | 3 refills | Status: DC
Start: 2023-06-14 — End: 2024-06-14

## 2023-06-14 NOTE — Assessment & Plan Note (Addendum)
Lab Results  Component Value Date   CHOL 137 04/21/2023   HDL 34.70 (L) 04/21/2023   LDLCALC 84 04/21/2023   LDLDIRECT 187.0 06/10/2022   TRIG 87.0 04/21/2023   CHOLHDL 4 04/21/2023   Stable  Con't fenofibrate

## 2023-06-14 NOTE — Assessment & Plan Note (Signed)
Ghm utd Check labs  See AVS  Health Maintenance  Topic Date Due   DTaP/Tdap/Td (1 - Tdap) Never done   Zoster Vaccines- Shingrix (1 of 2) Never done   Medicare Annual Wellness (AWV)  11/29/2020   DEXA SCAN  08/19/2021   MAMMOGRAM  09/16/2021   COVID-19 Vaccine (5 - 2023-24 season) 07/02/2022   INFLUENZA VACCINE  06/02/2023   Colonoscopy  06/30/2025   Pneumonia Vaccine 33+ Years old  Completed   Hepatitis C Screening  Completed   HPV VACCINES  Aged Out

## 2023-06-14 NOTE — Progress Notes (Signed)
Established Patient Office Visit  Subjective   Patient ID: Donna Tapia, female    DOB: 1951-08-16  Age: 72 y.o. MRN: [id number]  Chief Complaint  Patient presents with   Annual Exam    Pt states fasting     HPI Discussed the use of AI scribe software for clinical note transcription with the patient, who gave verbal consent to proceed.  History of Present Illness   The patient, with a history of lung issues and arthritis, presents for an annual physical. She reports feeling tired and has noticed unintentional weight loss. She attributes some of the weight loss to a recent illness, but notes that she had been losing weight prior to the illness. The patient has been taking Zoloft and requests a refill. She also expresses concern about her vitamin D levels and hemoglobin, wondering if she should be taking supplements. The patient mentions having difficulty tolerating antibiotics, which often cause gastrointestinal upset. She also discusses her upcoming travel plans and her concerns about getting various vaccines.      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   Lobar pneumonia (HCC) 01/01/2012   Renal failure, acute (HCC) 12/31/2011   Past Medical History:  Diagnosis Date   Anemia    Chicken pox    Environmental allergies    Hepatitis B 1978   Hyperlipidemia    Past Surgical History:  Procedure Laterality Date   ABDOMINAL HYSTERECTOMY     APPENDECTOMY     EVISCERATION   01/07/2012   Procedure: EVISCERATION REPAIR;  Surgeon: Antony Contras, MD;  Location: [facility] OR;  Service: Ophthalmology;  Laterality: Left;  Evisceration and removal of contents left eye   LUMBAR LAMINECTOMY/DECOMPRESSION MICRODISCECTOMY  01/04/2012   Procedure: LUMBAR LAMINECTOMY/DECOMPRESSION MICRODISCECTOMY 1 LEVEL;  Surgeon: Reinaldo Meeker, MD;  Location: [facility]UR[facility] ORS;  Service: Neurosurgery;  Laterality: N/A;  Lumbar Laminectomy for Epidural Abscess   TEE WITHOUT CARDIOVERSION  01/10/2012   Procedure: TRANSESOPHAGEAL ECHOCARDIOGRAM (TEE);  Surgeon: Pricilla Riffle, MD;  Location: [facility] ENDOSCOPY;  Service: Cardiovascular;  Laterality: N/A;   Social History   Tobacco Use   Smoking status: Never   Smokeless tobacco: Never  Substance Use Topics   Alcohol use: No   Drug use: No   Social History   Socioeconomic History   Marital status: Single    Spouse name: Not on file   Number of children: Not on file   Years of education: Not on file   Highest education level: Not on file  Occupational History   Occupation: retired  Tobacco Use   Smoking status: Never   Smokeless tobacco: Never  Substance and Sexual Activity   Alcohol use: No   Drug use: No   Sexual activity: Never  Other Topics Concern   Not on file  Social History Narrative   Exercise- walks   Social Determinants of Health   Financial Resource Strain: Low Risk  (11/30/2019)   Overall Financial Resource Strain (CARDIA)    Difficulty  of Paying Living Expenses: Not hard at all  Food Insecurity: No Food Insecurity (11/30/2019)   Hunger Vital Sign    Worried About Running Out of Food in the Last Year: Never true    Ran Out of Food in the Last Year: Never true  Transportation Needs: No Transportation Needs (11/30/2019)   PRAPARE - Administrator, Civil Service (Medical): No    Lack of Transportation (Non-Medical): No  Physical Activity: Not on file  Stress: Not on file  Social Connections: Not on file  Intimate Partner  Violence: Not on file   Family Status  Relation Name Status   Mother  (Not Specified)   Father  (Not Specified)   Emelda Brothers  (Not Specified)   Oneal Grout  (Not Specified)   Other  (Not Specified)   Other  (Not Specified)  No partnership data on file   Family History  Adopted: Yes  Problem Relation Age of Onset   Coronary artery disease Mother        Adopted mother   Parkinsonism Mother        Adopted Mother   Colon cancer Mother    Colon cancer Father    Hyperlipidemia Paternal Aunt    Hyperlipidemia Paternal Uncle    Heart disease Other        Both families   Hypertension Other    Allergies  Allergen Reactions   Elemental Sulfur Anaphylaxis and Rash   Statins Other (See Comments)    myalgia      Review of Systems  Constitutional:  Negative for fever and malaise/fatigue.  HENT:  Negative for congestion.   Eyes:  Negative for blurred vision.  Respiratory:  Negative for cough and shortness of breath.   Cardiovascular:  Negative for chest pain, palpitations and leg swelling.  Gastrointestinal:  Negative for abdominal pain, blood in stool, nausea and vomiting.  Genitourinary:  Negative for dysuria and frequency.  Musculoskeletal:  Negative for back pain and falls.  Skin:  Negative for rash.  Neurological:  Negative for dizziness, loss of consciousness and headaches.  Endo/Heme/Allergies:  Negative for environmental allergies.  Psychiatric/Behavioral:  Negative for depression. The patient is not nervous/anxious.       Objective:     BP 100/64 (BP Location: Left Arm, Patient Position: Sitting, Cuff Size: Normal)   Pulse 70   Temp 98.1 F (36.7 C) (Oral)   Resp 18   Ht 5\' 8"  (1.727 m)   Wt 124 lb 6.4 oz (56.4 kg)   SpO2 96%   BMI 18.91 kg/m  BP Readings from Last 3 Encounters:  06/14/23 100/64  04/21/23 98/60  12/16/22 100/72   Wt Readings from Last 3 Encounters:  06/14/23 124 lb 6.4 oz (56.4 kg)  04/21/23 128 lb 3.2 oz (58.2 kg)  12/16/22 133 lb 12.8 oz  (60.7 kg)   SpO2 Readings from Last 3 Encounters:  06/14/23 96%  04/21/23 94%  12/16/22 97%      Physical Exam Vitals and nursing note reviewed.  Constitutional:      General: She is not in acute distress.    Appearance: Normal appearance. She is well-developed.  HENT:     Head: Normocephalic and atraumatic.     Right Ear: Tympanic membrane, ear canal and external ear normal. There is no impacted cerumen.     Left Ear: Tympanic membrane, ear canal and external ear normal. There is no impacted cerumen.     Nose: Nose normal.  Mouth/Throat:     Mouth: Mucous membranes are moist.     Pharynx: Oropharynx is clear. No oropharyngeal exudate or posterior oropharyngeal erythema.  Eyes:     General: No scleral icterus.       Right eye: No discharge.        Left eye: No discharge.     Conjunctiva/sclera: Conjunctivae normal.     Pupils: Pupils are equal, round, and reactive to light.  Neck:     Thyroid: No thyromegaly or thyroid tenderness.     Vascular: No JVD.  Cardiovascular:     Rate and Rhythm: Normal rate and regular rhythm.     Heart sounds: Normal heart sounds. No murmur heard. Pulmonary:     Effort: Pulmonary effort is normal. No respiratory distress.     Breath sounds: Normal breath sounds.  Abdominal:     General: Bowel sounds are normal. There is no distension.     Palpations: Abdomen is soft. There is no mass.     Tenderness: There is no abdominal tenderness. There is no guarding or rebound.  Genitourinary:    Vagina: Normal.  Musculoskeletal:        General: Normal range of motion.     Cervical back: Normal range of motion and neck supple.     Right lower leg: No edema.     Left lower leg: No edema.  Lymphadenopathy:     Cervical: No cervical adenopathy.  Skin:    General: Skin is warm and dry.     Findings: No erythema or rash.  Neurological:     Mental Status: She is alert and oriented to person, place, and time.     Cranial Nerves: No cranial nerve  deficit.     Deep Tendon Reflexes: Reflexes are normal and symmetric.  Psychiatric:        Mood and Affect: Mood normal.        Behavior: Behavior normal.        Thought Content: Thought content normal.        Judgment: Judgment normal.      No results found for any visits on 06/14/23.  Last CBC Lab Results  Component Value Date   WBC 16.8 (H) 04/21/2023   HGB 10.5 (L) 04/21/2023   HCT 32.3 (L) 04/21/2023   MCV 95.5 04/21/2023   MCH 29.7 01/17/2012   RDW 13.7 04/21/2023   PLT 316.0 04/21/2023   Last metabolic panel Lab Results  Component Value Date   GLUCOSE 96 04/21/2023   NA 137 04/21/2023   K 4.2 04/21/2023   CL 100 04/21/2023   CO2 22 04/21/2023   BUN 22 04/21/2023   CREATININE 1.04 04/21/2023   GFR 53.07 (L) 04/21/2023   CALCIUM 9.0 04/21/2023   PROT 6.9 04/21/2023   ALBUMIN 3.7 04/21/2023   BILITOT 1.1 04/21/2023   ALKPHOS 158 (H) 04/21/2023   AST 43 (H) 04/21/2023   ALT 59 (H) 04/21/2023   Last lipids Lab Results  Component Value Date   CHOL 137 04/21/2023   HDL 34.70 (L) 04/21/2023   LDLCALC 84 04/21/2023   LDLDIRECT 187.0 06/10/2022   TRIG 87.0 04/21/2023   CHOLHDL 4 04/21/2023   Last hemoglobin A1c No results found for: "HGBA1C" Last thyroid functions Lab Results  Component Value Date   TSH 2.36 04/21/2023   Last vitamin D Lab Results  Component Value Date   VD25OH 66.21 04/21/2023   Last vitamin B12 and Folate Lab Results  Component Value Date  VITAMINB12 >1500 (H) 04/21/2023   FOLATE 8.7 12/31/2011      The 10-year ASCVD risk score (Arnett DK, et al., 2019) is: 8.8%    Assessment & Plan:   Problem List Items Addressed This Visit       Unprioritized   Anemia   Relevant Orders   IBC panel   Fecal occult blood, imunochemical   Depression with anxiety   Relevant Medications   sertraline (ZOLOFT) 100 MG tablet   Preventative health care - Primary    Ghm utd Check labs  See AVS  Health Maintenance  Topic Date Due    DTaP/Tdap/Td (1 - Tdap) Never done   Zoster Vaccines- Shingrix (1 of 2) Never done   Medicare Annual Wellness (AWV)  11/29/2020   DEXA SCAN  08/19/2021   MAMMOGRAM  09/16/2021   COVID-19 Vaccine (5 - 2023-24 season) 07/02/2022   INFLUENZA VACCINE  06/02/2023   Colonoscopy  06/30/2025   Pneumonia Vaccine 56+ Years old  Completed   Hepatitis C Screening  Completed   HPV VACCINES  Aged Out         Hyperlipidemia    Lab Results  Component Value Date   CHOL 137 04/21/2023   HDL 34.70 (L) 04/21/2023   LDLCALC 84 04/21/2023   LDLDIRECT 187.0 06/10/2022   TRIG 87.0 04/21/2023   CHOLHDL 4 04/21/2023   Stable  Con't fenofibrate       Other Visit Diagnoses     Leukocytosis, unspecified type       Relevant Orders   CBC with Differential/Platelet   Need for pneumococcal 20-valent conjugate vaccination       Relevant Orders   Pneumococcal conjugate vaccine 20-valent (Prevnar 20) (Completed)     Assessment and Plan    Depression Stable on Zoloft. -Refilled Zoloft for one year.  Anemia Hemoglobin was low at 10.5. No reported bleeding. -Start a women's once a day multivitamin with iron. -Repeat CBC today to reassess hemoglobin.  General Health Maintenance -Administer pneumonia vaccine today. -Plan to administer RSV vaccine at a later date. -Plan to administer flu vaccine in late September or early October. -Plan to administer COVID-19 booster three months post-infection.        Return in about 6 months (around 12/15/2023), or if symptoms worsen or fail to improve.    [name], DO

## 2023-06-14 NOTE — Patient Instructions (Signed)
Preventive Care 65 Years and Older, Female Preventive care refers to lifestyle choices and visits with your health care provider that can promote health and wellness. Preventive care visits are also called wellness exams. What can I expect for my preventive care visit? Counseling Your health care provider may ask you questions about your: Medical history, including: Past medical problems. Family medical history. Pregnancy and menstrual history. History of falls. Current health, including: Memory and ability to understand (cognition). Emotional well-being. Home life and relationship well-being. Sexual activity and sexual health. Lifestyle, including: Alcohol, nicotine or tobacco, and drug use. Access to firearms. Diet, exercise, and sleep habits. Work and work environment. Sunscreen use. Safety issues such as seatbelt and bike helmet use. Physical exam Your health care provider will check your: Height and weight. These may be used to calculate your BMI (body mass index). BMI is a measurement that tells if you are at a healthy weight. Waist circumference. This measures the distance around your waistline. This measurement also tells if you are at a healthy weight and may help predict your risk of certain diseases, such as type 2 diabetes and high blood pressure. Heart rate and blood pressure. Body temperature. Skin for abnormal spots. What immunizations do I need?  Vaccines are usually given at various ages, according to a schedule. Your health care provider will recommend vaccines for you based on your age, medical history, and lifestyle or other factors, such as travel or where you work. What tests do I need? Screening Your health care provider may recommend screening tests for certain conditions. This may include: Lipid and cholesterol levels. Hepatitis C test. Hepatitis B test. HIV (human immunodeficiency virus) test. STI (sexually transmitted infection) testing, if you are at  risk. Lung cancer screening. Colorectal cancer screening. Diabetes screening. This is done by checking your blood sugar (glucose) after you have not eaten for a while (fasting). Mammogram. Talk with your health care provider about how often you should have regular mammograms. BRCA-related cancer screening. This may be done if you have a family history of breast, ovarian, tubal, or peritoneal cancers. Bone density scan. This is done to screen for osteoporosis. Talk with your health care provider about your test results, treatment options, and if necessary, the need for more tests. Follow these instructions at home: Eating and drinking  Eat a diet that includes fresh fruits and vegetables, whole grains, lean protein, and low-fat dairy products. Limit your intake of foods with high amounts of sugar, saturated fats, and salt. Take vitamin and mineral supplements as recommended by your health care provider. Do not drink alcohol if your health care provider tells you not to drink. If you drink alcohol: Limit how much you have to 0-1 drink a day. Know how much alcohol is in your drink. In the [place] one drink equals one 12 oz bottle of beer (355 mL), one 5 oz glass of wine (148 mL), or one 1 oz glass of hard liquor (44 mL). Lifestyle Brush your teeth every morning and night with fluoride toothpaste. Floss one time each day. Exercise for at least 30 minutes 5 or more days each week. Do not use any products that contain nicotine or tobacco. These products include cigarettes, chewing tobacco, and vaping devices, such as e-cigarettes. If you need help quitting, ask your health care provider. Do not use drugs. If you are sexually active, practice safe sex. Use a condom or other form of protection in order to prevent STIs. Take aspirin only as told by   your health care provider. Make sure that you understand how much to take and what form to take. Work with your health care provider to find out whether it  is safe and beneficial for you to take aspirin daily. Ask your health care provider if you need to take a cholesterol-lowering medicine (statin). Find healthy ways to manage stress, such as: Meditation, yoga, or listening to music. Journaling. Talking to a trusted person. Spending time with friends and family. Minimize exposure to UV radiation to reduce your risk of skin cancer. Safety Always wear your seat belt while driving or riding in a vehicle. Do not drive: If you have been drinking alcohol. Do not ride with someone who has been drinking. When you are tired or distracted. While texting. If you have been using any mind-altering substances or drugs. Wear a helmet and other protective equipment during sports activities. If you have firearms in your house, make sure you follow all gun safety procedures. What's next? Visit your health care provider once a year for an annual wellness visit. Ask your health care provider how often you should have your eyes and teeth checked. Stay up to date on all vaccines. This information is not intended to replace advice given to you by your health care provider. Make sure you discuss any questions you have with your health care provider. Document Revised: 04/15/2021 Document Reviewed: 04/15/2021 Elsevier Patient Education  2024 [employer] Inc[employer]  

## 2023-06-29 ENCOUNTER — Ambulatory Visit: Payer: 59 | Admitting: Emergency Medicine

## 2023-08-25 ENCOUNTER — Encounter: Payer: Self-pay | Admitting: Emergency Medicine

## 2023-08-25 ENCOUNTER — Ambulatory Visit: Payer: 59 | Admitting: Emergency Medicine

## 2023-08-25 VITALS — BP 99/58 | HR 76 | Temp 98.2°F | Ht 68.0 in | Wt 126.0 lb

## 2023-08-25 DIAGNOSIS — J479 Bronchiectasis, uncomplicated: Secondary | ICD-10-CM | POA: Diagnosis not present

## 2023-08-25 DIAGNOSIS — J309 Allergic rhinitis, unspecified: Secondary | ICD-10-CM | POA: Insufficient documentation

## 2023-08-25 DIAGNOSIS — J301 Allergic rhinitis due to pollen: Secondary | ICD-10-CM | POA: Diagnosis not present

## 2023-08-25 NOTE — Assessment & Plan Note (Signed)
She has history of recurrent pneumonias, was treated for another pneumonia in June with principally a left lower lobe infiltrate.  This cleared on a repeat chest x-ray.  Given her history I think she needs a repeat CT chest to better characterize her bronchiectasis.  She agrees and we will arrange for this.

## 2023-08-25 NOTE — Progress Notes (Signed)
I think a printed uncle know if I did that I can tell all right now see  Subjective:    Patient ID: Donna Tapia, female    DOB: October 26, 1951, 72 y.o.   MRN: [id number]  HPI 72 year old woman, never smoker, with history of hyperlipidemia, allergic rhinitis, anemia, lumbar epidural abscess, psoas abscess 2013.  I saw her back in 2018 after hospitalization that included left-sided multi lobar HCAP.  Subsequent imaging showed left upper lobe predominant bronchiectasis. She is here today to reestablish care.  She has been well, no more episodes overt PNA. She has a chronic cough w a globus sensation. Clears some mucous from her throat most mornings. She uses benadryl 12.5mg  at night. Has some throat clearing through the day. She has clear nasal drainage. Occasional GERD, not every day. She does not get SOB. Good functional capacity.   ROV 08/25/2023 --follow-up visit for 72 year old woman, never smoker, whom I have seen in the past for bronchiectasis in the left upper lobe in the aftermath of a severe left-sided HCAP (2018).  She has some chronic cough with a globus sensation and nasal congestion/allergies.  Occasional GERD.  She was treated for a PNA in June and CXR showed B infiltrates. Then she had COVID-19 in July and was treated with Paxlovid.  A chest x-ray at that time showed improved left basilar aeration and some stable chronic scar in the bilateral upper lobes. She feels that she is better, approaching baseline. She has nasal congestion and is on loratadine, benadryl.    Review of Systems As per HPI  Past Medical History:  Diagnosis Date   Anemia    Chicken pox    Environmental allergies    Hepatitis B 1978   Hyperlipidemia      Family History  Adopted: Yes  Problem Relation Age of Onset   Coronary artery disease Mother        Adopted mother   Parkinsonism Mother        Adopted Mother   Colon cancer Mother    Colon cancer Father    Hyperlipidemia Paternal Aunt    Hyperlipidemia  Paternal Uncle    Heart disease Other        Both families   Hypertension Other      Social History   Socioeconomic History   Marital status: Single    Spouse name: Not on file   Number of children: Not on file   Years of education: Not on file   Highest education level: Not on file  Occupational History   Occupation: retired  Tobacco Use   Smoking status: Never   Smokeless tobacco: Never  Substance and Sexual Activity   Alcohol use: No   Drug use: No   Sexual activity: Never  Other Topics Concern   Not on file  Social History Narrative   Exercise- walks   Social Determinants of Health   Financial Resource Strain: Low Risk  (11/30/2019)   Overall Financial Resource Strain (CARDIA)    Difficulty of Paying Living Expenses: Not hard at all  Food Insecurity: No Food Insecurity (11/30/2019)   Hunger Vital Sign    Worried About Running Out of Food in the Last Year: Never true    Ran Out of Food in the Last Year: Never true  Transportation Needs: No Transportation Needs (11/30/2019)   PRAPARE - Administrator, Civil Service (Medical): No    Lack of Transportation (Non-Medical): No  Physical Activity: Not on file  Stress: Not on file  Social Connections: Not on file  Intimate Partner Violence: Not on file     Allergies  Allergen Reactions   Elemental Sulfur Anaphylaxis and Rash   Statins Other (See Comments)    myalgia     Outpatient Medications Prior to Visit  Medication Sig Dispense Refill   acetaminophen (TYLENOL) 325 MG tablet Take 325 mg by mouth every 4 (four) hours as needed. For pain      artificial tears (LACRILUBE) OINT ophthalmic ointment Apply to eye 4 (four) times daily. (Patient taking differently: Apply to eye as needed.) 1 Tube 0   calcium carbonate (OS-CAL - DOSED IN MG OF ELEMENTAL CALCIUM) 1250 (500 Ca) MG tablet Take 1 tablet by mouth daily with breakfast.     Cholecalciferol (VITAMIN D3) 2000 units TABS Take 1 tablet by mouth daily.      CO ENZYME Q-10 PO Take 1 capsule by mouth daily.     ezetimibe (ZETIA) 10 MG tablet Take 1 tablet (10 mg total) by mouth daily. (Patient not taking: Reported on 08/25/2023) 90 tablet 1   folic acid (FOLVITE) 400 MCG tablet Take 400 mcg by mouth 3 (three) times a week.      loratadine (CLARITIN) 10 MG tablet Take 1 tablet (10 mg total) by mouth daily. 30 tablet 5   Multiple Vitamin (MULITIVITAMIN WITH MINERALS) TABS Take 1 tablet by mouth daily.     Omega-3 Fatty Acids (FISH OIL) 1000 MG CAPS Take 1 capsule by mouth daily.     sertraline (ZOLOFT) 100 MG tablet Take 1 tablet (100 mg total) by mouth daily. 90 tablet 3   Vitamin D, Ergocalciferol, (DRISDOL) 1.25 MG (50000 UNIT) CAPS capsule TAKE 1 CAPSULE BY MOUTH EVERY 7 DAYS 4 capsule 4   No facility-administered medications prior to visit.        Objective:   Physical Exam Vitals:   08/25/23 1126  BP: (!) 99/58  Pulse: 76  Temp: 98.2 F (36.8 C)  TempSrc: Oral  SpO2: 96%  Weight: 126 lb (57.2 kg)  Height: 5\' 8"  (1.727 m)   Gen: Pleasant, well-nourished, in no distress,  normal affect  ENT: No lesions,  mouth clear,  oropharynx clear, no postnasal drip  Neck: No JVD, no stridor  Lungs: No use of accessory muscles, no crackles or wheezing on normal respiration, no wheeze on forced expiration  Cardiovascular: RRR, heart sounds normal, no murmur or gallops, no peripheral edema  Musculoskeletal: No deformities, no cyanosis or clubbing  Neuro: alert, awake, non focal  Skin: Warm, no lesions or rash       Assessment & Plan:  Bronchiectasis without complication (HCC) She has history of recurrent pneumonias, was treated for another pneumonia in June with principally a left lower lobe infiltrate.  This cleared on a repeat chest x-ray.  Given her history I think she needs a repeat CT chest to better characterize her bronchiectasis.  She agrees and we will arrange for this.  Allergic rhinitis On loratadine and Benadryl.  Have  recommended that she add Flonase if she continues to have symptoms.   Levy Pupa, MD, PhD 08/25/2023, 12:02 PM [facility] [phone] or if no answer before 7:00PM call [phone] For any issues after 7:00PM please call eLink [phone]

## 2023-08-25 NOTE — Patient Instructions (Signed)
We will plan to repeat your CT scan of the chest to evaluate mild bronchiectasis Continue your loratadine and Benadryl as you have been taking them You could consider adding over-the-counter Flonase 2 sprays each nostril once daily if you are still having trouble with allergies this fall Follow Dr. Delton Coombes in about 6 to 8 weeks so we can review your CT scan.

## 2023-08-25 NOTE — Assessment & Plan Note (Signed)
On loratadine and Benadryl.  Have recommended that she add Flonase if she continues to have symptoms.

## 2023-09-05 ENCOUNTER — Ambulatory Visit (HOSPITAL_BASED_OUTPATIENT_CLINIC_OR_DEPARTMENT_OTHER): Payer: 59

## 2023-09-14 ENCOUNTER — Ambulatory Visit (HOSPITAL_BASED_OUTPATIENT_CLINIC_OR_DEPARTMENT_OTHER)
Admission: RE | Admit: 2023-09-14 | Discharge: 2023-09-14 | Disposition: A | Discharge status: Home / Self Care | Payer: 59 | Source: Ambulatory Visit | Attending: Emergency Medicine | Admitting: Emergency Medicine

## 2023-09-14 DIAGNOSIS — J479 Bronchiectasis, uncomplicated: Secondary | ICD-10-CM | POA: Diagnosis present

## 2023-10-04 ENCOUNTER — Other Ambulatory Visit: Payer: 59

## 2023-10-17 ENCOUNTER — Other Ambulatory Visit: Payer: Self-pay | Admitting: Family Medicine

## 2023-10-17 ENCOUNTER — Other Ambulatory Visit: Payer: 59

## 2023-10-17 DIAGNOSIS — E2839 Other primary ovarian failure: Secondary | ICD-10-CM

## 2023-12-15 ENCOUNTER — Ambulatory Visit: Payer: 59 | Admitting: Family Medicine

## 2024-06-07 ENCOUNTER — Ambulatory Visit (HOSPITAL_BASED_OUTPATIENT_CLINIC_OR_DEPARTMENT_OTHER)
Admission: RE | Admit: 2024-06-07 | Discharge: 2024-06-07 | Disposition: A | Discharge status: Home / Self Care | Source: Ambulatory Visit | Attending: Family Medicine | Admitting: Family Medicine

## 2024-06-07 DIAGNOSIS — E2839 Other primary ovarian failure: Secondary | ICD-10-CM | POA: Insufficient documentation

## 2024-06-08 ENCOUNTER — Other Ambulatory Visit: Payer: 59

## 2024-06-10 ENCOUNTER — Ambulatory Visit: Payer: Self-pay | Admitting: Family Medicine

## 2024-06-14 ENCOUNTER — Encounter: Payer: Self-pay | Admitting: Family Medicine

## 2024-06-14 ENCOUNTER — Ambulatory Visit (HOSPITAL_BASED_OUTPATIENT_CLINIC_OR_DEPARTMENT_OTHER)
Admission: RE | Admit: 2024-06-14 | Discharge: 2024-06-14 | Disposition: A | Discharge status: Home / Self Care | Source: Ambulatory Visit | Attending: Family Medicine | Admitting: Family Medicine

## 2024-06-14 ENCOUNTER — Ambulatory Visit (INDEPENDENT_AMBULATORY_CARE_PROVIDER_SITE_OTHER): Payer: 59 | Admitting: Family Medicine

## 2024-06-14 VITALS — BP 106/60 | HR 77 | Temp 98.4°F | Resp 16 | Ht 68.0 in | Wt 124.6 lb

## 2024-06-14 DIAGNOSIS — R350 Frequency of micturition: Secondary | ICD-10-CM

## 2024-06-14 DIAGNOSIS — R053 Chronic cough: Secondary | ICD-10-CM

## 2024-06-14 DIAGNOSIS — Z862 Personal history of diseases of the blood and blood-forming organs and certain disorders involving the immune mechanism: Secondary | ICD-10-CM | POA: Diagnosis not present

## 2024-06-14 DIAGNOSIS — F418 Other specified anxiety disorders: Secondary | ICD-10-CM

## 2024-06-14 DIAGNOSIS — E785 Hyperlipidemia, unspecified: Secondary | ICD-10-CM

## 2024-06-14 DIAGNOSIS — H6122 Impacted cerumen, left ear: Secondary | ICD-10-CM | POA: Diagnosis not present

## 2024-06-14 DIAGNOSIS — Z0000 Encounter for general adult medical examination without abnormal findings: Secondary | ICD-10-CM

## 2024-06-14 DIAGNOSIS — H60392 Other infective otitis externa, left ear: Secondary | ICD-10-CM

## 2024-06-14 DIAGNOSIS — E559 Vitamin D deficiency, unspecified: Secondary | ICD-10-CM | POA: Diagnosis not present

## 2024-06-14 LAB — CBC WITH DIFFERENTIAL/PLATELET
Basophils Absolute: 0 K/uL (ref 0.0–0.1)
Basophils Relative: 0.4 % (ref 0.0–3.0)
Eosinophils Absolute: 0.2 K/uL (ref 0.0–0.7)
Eosinophils Relative: 1.7 % (ref 0.0–5.0)
HCT: 39.6 % (ref 36.0–46.0)
Hemoglobin: 13.2 g/dL (ref 12.0–15.0)
Lymphocytes Relative: 24 % (ref 12.0–46.0)
Lymphs Abs: 2.3 K/uL (ref 0.7–4.0)
MCHC: 33.4 g/dL (ref 30.0–36.0)
MCV: 94.4 fl (ref 78.0–100.0)
Monocytes Absolute: 0.6 K/uL (ref 0.1–1.0)
Monocytes Relative: 6.4 % (ref 3.0–12.0)
Neutro Abs: 6.5 K/uL (ref 1.4–7.7)
Neutrophils Relative %: 67.5 % (ref 43.0–77.0)
Platelets: 302 K/uL (ref 150.0–400.0)
RBC: 4.19 Mil/uL (ref 3.87–5.11)
RDW: 13.7 % (ref 11.5–15.5)
WBC: 9.7 K/uL (ref 4.0–10.5)

## 2024-06-14 LAB — COMPREHENSIVE METABOLIC PANEL WITH GFR
ALT: 21 U/L (ref 0–35)
AST: 24 U/L (ref 0–37)
Albumin: 4.5 g/dL (ref 3.5–5.2)
Alkaline Phosphatase: 92 U/L (ref 39–117)
BUN: 22 mg/dL (ref 6–23)
CO2: 29 meq/L (ref 19–32)
Calcium: 9.4 mg/dL (ref 8.4–10.5)
Chloride: 103 meq/L (ref 96–112)
Creatinine, Ser: 0.92 mg/dL (ref 0.40–1.20)
GFR: 60.98 mL/min (ref >60.00)
Glucose, Bld: 88 mg/dL (ref 70–99)
Potassium: 4.7 meq/L (ref 3.5–5.1)
Sodium: 140 meq/L (ref 135–145)
Total Bilirubin: 0.6 mg/dL (ref 0.2–1.2)
Total Protein: 7.4 g/dL (ref 6.0–8.3)

## 2024-06-14 LAB — POC URINALSYSI DIPSTICK (AUTOMATED)
Blood, UA: NEGATIVE
Glucose, UA: NEGATIVE
Leukocytes, UA: NEGATIVE
Nitrite, UA: NEGATIVE
Protein, UA: POSITIVE — AB
Spec Grav, UA: 1.01 (ref 1.010–1.025)
Urobilinogen, UA: 0.2 U/dL
pH, UA: 6 (ref 5.0–8.0)

## 2024-06-14 LAB — VITAMIN D 25 HYDROXY (VIT D DEFICIENCY, FRACTURES): VITD: 45.66 ng/mL (ref 30.00–100.00)

## 2024-06-14 LAB — LIPID PANEL
Cholesterol: 245 mg/dL — ABNORMAL HIGH (ref 0–200)
HDL: 57.6 mg/dL (ref >39.00)
LDL Cholesterol: 155 mg/dL — ABNORMAL HIGH (ref 0–99)
NonHDL: 187.2
Total CHOL/HDL Ratio: 4
Triglycerides: 160 mg/dL — ABNORMAL HIGH (ref 0.0–149.0)
VLDL: 32 mg/dL (ref 0.0–40.0)

## 2024-06-14 LAB — TSH: TSH: 3.88 u[IU]/mL (ref 0.35–5.50)

## 2024-06-14 MED ORDER — OFLOXACIN 0.3 % OT SOLN: 10.0000 [drp] | Freq: Every day | OTIC | 0 refills | Status: AC

## 2024-06-14 MED ORDER — AMOXICILLIN-POT CLAVULANATE 875-125 MG PO TABS: 1.0000 | ORAL_TABLET | Freq: Two times a day (BID) | ORAL | 0 refills | Status: AC

## 2024-06-14 MED ORDER — SERTRALINE HCL 100 MG PO TABS: 100.0000 mg | ORAL_TABLET | Freq: Every day | ORAL | 3 refills | Status: DC

## 2024-06-14 NOTE — Progress Notes (Signed)
 Subjective:    Patient ID: Brad FORBES Batty, female    DOB: 1951-03-19, 73 y.o.   MRN: [id number]  Chief Complaint  Patient presents with   Annual Exam    Pt states fasting     HPI Patient is in today for cpe.  Discussed the use of AI scribe software for clinical note transcription with the patient, who gave verbal consent to proceed.  History of Present Illness ERIKKA FOLLMER is a 73 year old female with rheumatoid arthritis and osteoporosis who presents with ear discomfort and wax buildup.  She began experiencing ear discomfort on Monday, with significant wax buildup in her ear, leading to tenderness and swelling. She attempted to alleviate the blockage using ear drops and baby oil without relief. The ear is almost completely blocked and irritated from her efforts to clear it over the past three days.  She is currently taking Zoloft  and requires a refill. She also takes a multivitamin and previously took Osteo-K for osteoporosis. Her diet includes milk, cheese, carrots, celery, and lettuce. She was on a high dosage of vitamin D3 due to low levels but now takes an over-the-counter supplement.  She has a history of osteoporosis and recently underwent a bone density scan after a year-long delay. She does not take Fosamax  and attempts to maintain a calcium  intake of 1200 mg daily through diet and supplements.  She experiences chronic lower back and hip pain. She has a history of a spinal infection and rheumatoid arthritis. The pain is episodic, with periods of minimal discomfort followed by more intense pain lasting up to a month. 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  She reports increased urinary frequency and sensitivity, though not burning, and recalls a past asymptomatic urinary tract infection. She requests a urinalysis to check for any current issues.  Her family history includes an aunt with  stage four ovarian cancer, who recently celebrated her 80th birthday with a large family gathering.    Past Medical History:  Diagnosis Date   Anemia    Chicken pox    Environmental allergies    Hepatitis B 1978   Hyperlipidemia     Past Surgical History:  Procedure Laterality Date   ABDOMINAL HYSTERECTOMY     APPENDECTOMY     EVISCERATION  01/07/2012   Procedure: EVISCERATION REPAIR;  Surgeon: Arlyss King, MD;  Location: [facility] OR;  Service: Ophthalmology;  Laterality: Left;  Evisceration and removal of contents left eye   LUMBAR LAMINECTOMY/DECOMPRESSION MICRODISCECTOMY  01/04/2012   Procedure: LUMBAR LAMINECTOMY/DECOMPRESSION MICRODISCECTOMY 1 LEVEL;  Surgeon: Darina MALVA Boehringer, MD;  Location: [facility];  Service: Neurosurgery;  Laterality: N/A;  Lumbar Laminectomy for Epidural Abscess   TEE WITHOUT CARDIOVERSION  01/10/2012   Procedure: TRANSESOPHAGEAL ECHOCARDIOGRAM (TEE);  Surgeon: Vina LULLA Gull, MD;  Location: [facility];  Service: Cardiovascular;  Laterality: N/A;    Family History  Adopted: Yes  Problem Relation Age of Onset   Coronary artery disease Mother        Adopted mother   Parkinsonism Mother        Adopted Mother   Colon cancer Mother    Colon cancer Father    Hyperlipidemia Paternal Aunt    Hyperlipidemia Paternal Uncle    Heart disease Other        Both families   Hypertension Other     Social History   Socioeconomic History  Marital status: Single    Spouse name: Not on file   Number of children: Not on file   Years of education: Not on file   Highest education level: Not on file  Occupational History   Occupation: retired  Tobacco Use   Smoking status: Never   Smokeless tobacco: Never  Substance and Sexual Activity   Alcohol  use: No   Drug use: No   Sexual activity: Never  Other Topics Concern   Not on file  Social History Narrative   Exercise- walks   Social Drivers of Health   Financial Resource Strain: Low Risk  (11/30/2019)    Overall Financial Resource Strain (CARDIA)    Difficulty of Paying Living Expenses: Not hard at all  Food Insecurity: No Food Insecurity (11/30/2019)   Hunger Vital Sign    Worried About Running Out of Food in the Last Year: Never true    Ran Out of Food in the Last Year: Never true  Transportation Needs: No Transportation Needs (11/30/2019)   PRAPARE - Administrator, Civil Service (Medical): No    Lack of Transportation (Non-Medical): No  Physical Activity: Not on file  Stress: Not on file  Social Connections: Not on file  Intimate Partner Violence: Not on file    Outpatient Medications Prior to Visit  Medication Sig Dispense Refill   acetaminophen  (TYLENOL ) 325 MG tablet Take 325 mg by mouth every 4 (four) hours as needed. For pain      artificial tears (LACRILUBE) OINT ophthalmic ointment Apply to eye 4 (four) times daily. (Patient taking differently: Apply to eye as needed.) 1 Tube 0   calcium  carbonate (OS-CAL - DOSED IN MG OF ELEMENTAL CALCIUM ) 1250 (500 Ca) MG tablet Take 1 tablet by mouth daily with breakfast.     Cholecalciferol (VITAMIN D3) 2000 units TABS Take 1 tablet by mouth daily.     CO ENZYME Q-10 PO Take 1 capsule by mouth daily.     folic acid (FOLVITE) 400 MCG tablet Take 400 mcg by mouth 3 (three) times a week.      loratadine  (CLARITIN ) 10 MG tablet Take 1 tablet (10 mg total) by mouth daily. 30 tablet 5   Multiple Vitamin (MULITIVITAMIN WITH MINERALS) TABS Take 1 tablet by mouth daily.     Omega-3 Fatty Acids (FISH OIL) 1000 MG CAPS Take 1 capsule by mouth daily.     Vitamin D , Ergocalciferol , (DRISDOL ) 1.25 MG (50000 UNIT) CAPS capsule TAKE 1 CAPSULE BY MOUTH EVERY 7 DAYS 4 capsule 4   sertraline  (ZOLOFT ) 100 MG tablet Take 1 tablet (100 mg total) by mouth daily. 90 tablet 3   ezetimibe  (ZETIA ) 10 MG tablet Take 1 tablet (10 mg total) by mouth daily. (Patient not taking: Reported on 06/14/2024) 90 tablet 1   No facility-administered medications prior to  visit.    Allergies  Allergen Reactions   Elemental Sulfur Anaphylaxis and Rash   Statins Other (See Comments)    myalgia    Review of Systems  Constitutional:  Negative for fever and malaise/fatigue.  HENT:  Positive for ear pain. Negative for congestion.   Eyes:  Negative for blurred vision.  Respiratory:  Negative for cough and shortness of breath.   Cardiovascular:  Negative for chest pain, palpitations and leg swelling.  Gastrointestinal:  Negative for abdominal pain, blood in stool, nausea and vomiting.  Genitourinary:  Negative for dysuria and frequency.  Musculoskeletal:  Negative for back pain and falls.  Skin:  Negative  for rash.  Neurological:  Negative for dizziness, loss of consciousness and headaches.  Endo/Heme/Allergies:  Negative for environmental allergies.  Psychiatric/Behavioral:  Negative for depression. The patient is not nervous/anxious.        Objective:    Physical Exam Vitals and nursing note reviewed.  Constitutional:      General: She is not in acute distress.    Appearance: Normal appearance. She is well-developed.  HENT:     Head: Normocephalic and atraumatic.     Right Ear: Tympanic membrane, ear canal and external ear normal. There is no impacted cerumen.     Left Ear: Decreased hearing noted. Swelling and tenderness present. There is impacted cerumen.     Ears:     Comments: Unable to remove cerumen with hoop----or irrigation due to pain     Nose: Nose normal.     Mouth/Throat:     Mouth: Mucous membranes are moist.     Pharynx: Oropharynx is clear. No oropharyngeal exudate or posterior oropharyngeal erythema.  Eyes:     General: No scleral icterus.       Right eye: No discharge.        Left eye: No discharge.     Conjunctiva/sclera: Conjunctivae normal.     Pupils: Pupils are equal, round, and reactive to light.  Neck:     Thyroid : No thyromegaly or thyroid  tenderness.     Vascular: No JVD.  Cardiovascular:     Rate and Rhythm:  Normal rate and regular rhythm.     Heart sounds: Normal heart sounds. No murmur heard. Pulmonary:     Effort: Pulmonary effort is normal. No respiratory distress.     Breath sounds: Normal breath sounds.  Abdominal:     General: Bowel sounds are normal. There is no distension.     Palpations: Abdomen is soft. There is no mass.     Tenderness: There is no abdominal tenderness. There is no guarding or rebound.  Musculoskeletal:        General: Normal range of motion.     Cervical back: Normal range of motion and neck supple.     Right lower leg: No edema.     Left lower leg: No edema.  Lymphadenopathy:     Cervical: No cervical adenopathy.  Skin:    General: Skin is warm and dry.     Findings: No erythema or rash.  Neurological:     Mental Status: She is alert and oriented to person, place, and time.     Cranial Nerves: No cranial nerve deficit.     Deep Tendon Reflexes: Reflexes are normal and symmetric.  Psychiatric:        Mood and Affect: Mood normal.        Behavior: Behavior normal.        Thought Content: Thought content normal.        Judgment: Judgment normal.     BP 106/60 (BP Location: Left Arm, Patient Position: Sitting, Cuff Size: Normal)   Pulse 77   Temp 98.4 F (36.9 C) (Oral)   Resp 16   Ht 5' 8 (1.727 m)   Wt 124 lb 9.6 oz (56.5 kg)   SpO2 97%   BMI 18.95 kg/m  Wt Readings from Last 3 Encounters:  06/14/24 124 lb 9.6 oz (56.5 kg)  08/25/23 126 lb (57.2 kg)  06/14/23 124 lb 6.4 oz (56.4 kg)    Diabetic Foot Exam - Simple   No data filed    Lab  Results  Component Value Date   WBC 8.4 06/14/2023   HGB 12.1 06/14/2023   HCT 36.9 06/14/2023   PLT 283.0 06/14/2023   GLUCOSE 96 04/21/2023   CHOL 137 04/21/2023   TRIG 87.0 04/21/2023   HDL 34.70 (L) 04/21/2023   LDLDIRECT 187.0 06/10/2022   LDLCALC 84 04/21/2023   ALT 59 (H) 04/21/2023   AST 43 (H) 04/21/2023   NA 137 04/21/2023   K 4.2 04/21/2023   CL 100 04/21/2023   CREATININE 1.04  04/21/2023   BUN 22 04/21/2023   CO2 22 04/21/2023   TSH 2.36 04/21/2023   INR 1.19 01/06/2012    Lab Results  Component Value Date   TSH 2.36 04/21/2023   Lab Results  Component Value Date   WBC 8.4 06/14/2023   HGB 12.1 06/14/2023   HCT 36.9 06/14/2023   MCV 93.4 06/14/2023   PLT 283.0 06/14/2023   Lab Results  Component Value Date   NA 137 04/21/2023   K 4.2 04/21/2023   CO2 22 04/21/2023   GLUCOSE 96 04/21/2023   BUN 22 04/21/2023   CREATININE 1.04 04/21/2023   BILITOT 1.1 04/21/2023   ALKPHOS 158 (H) 04/21/2023   AST 43 (H) 04/21/2023   ALT 59 (H) 04/21/2023   PROT 6.9 04/21/2023   ALBUMIN 3.7 04/21/2023   CALCIUM  9.0 04/21/2023   GFR 53.07 (L) 04/21/2023   Lab Results  Component Value Date   CHOL 137 04/21/2023   Lab Results  Component Value Date   HDL 34.70 (L) 04/21/2023   Lab Results  Component Value Date   LDLCALC 84 04/21/2023   Lab Results  Component Value Date   TRIG 87.0 04/21/2023   Lab Results  Component Value Date   CHOLHDL 4 04/21/2023   No results found for: HGBA1C     Assessment & Plan:  Preventative health care  Depression with anxiety -     Sertraline  HCl; Take 1 tablet (100 mg total) by mouth daily.  Dispense: 90 tablet; Refill: 3 -     TSH  Hyperlipidemia, unspecified hyperlipidemia type -     Comprehensive metabolic panel with GFR -     Lipid panel -     DG Lumbar Spine Complete; Future  Vitamin D  deficiency -     VITAMIN D  25 Hydroxy (Vit-D Deficiency, Fractures)  History of anemia -     CBC with Differential/Platelet  Chronic cough -     DG Chest 2 View; Future  Urinary frequency -     POCT Urinalysis Dipstick (Automated)  Other infective acute otitis externa of left ear -     Ofloxacin ; Place 10 drops into the left ear daily.  Dispense: 10 mL; Refill: 0 -     Amoxicillin -Pot Clavulanate; Take 1 tablet by mouth 2 (two) times daily.  Dispense: 20 tablet; Refill: 0 -     Ambulatory referral to  ENT  Impacted cerumen of left ear -     Ambulatory referral to ENT  Assessment and Plan Assessment & Plan Cerumen impaction, left ear   Cerumen impaction in the left ear causes tenderness, swelling, and near-total blockage. She has been using drops and baby oil to soften the wax, leading to irritation from removal attempts. Attempt cerumen removal in the office. Refer to ENT for suction removal if unsuccessful. Prescribe antibiotic ear drops if needed based on eardrum examination. Consider oral antibiotics if the eardrum appears compromised.  Osteoporosis   Osteoporosis is confirmed by a recent  bone density scan. She is not on Fosamax  or similar medications but takes a multivitamin and has previously taken Osteo-K supplements. She consumes dietary sources of calcium  and vitamin D . Recommend calcium  intake of 1200 mg daily through diet or supplements and ensure vitamin D3 intake of 1000 IU daily. Provide printed results of the bone density scan for personal records.  Rheumatoid arthritis with chronic low back and hip pain   Chronic low back and hip pain may be exacerbated by rheumatoid arthritis. She reports intermittent pain with some relief periods. No recent imaging of the affected area has been done. Order x-rays of the back and chest.  Depression   Depression is managed with Zoloft . She requests a refill of her prescription. Refill Zoloft  prescription and send to [facility].  Urinary frequency and sensitivity   Increased urinary frequency and sensitivity occur without significant burning. Perform urinalysis to check for a urinary tract infection.    Aadhira Heffernan R Lowne Chase, DO

## 2024-06-18 ENCOUNTER — Ambulatory Visit: Payer: Self-pay | Admitting: Family Medicine

## 2024-06-20 ENCOUNTER — Ambulatory Visit (INDEPENDENT_AMBULATORY_CARE_PROVIDER_SITE_OTHER): Admitting: Physician Assistant

## 2024-06-20 ENCOUNTER — Other Ambulatory Visit: Payer: Self-pay | Admitting: Family Medicine

## 2024-06-20 ENCOUNTER — Encounter (INDEPENDENT_AMBULATORY_CARE_PROVIDER_SITE_OTHER): Payer: Self-pay | Admitting: Physician Assistant

## 2024-06-20 VITALS — BP 121/68 | HR 77

## 2024-06-20 DIAGNOSIS — H6123 Impacted cerumen, bilateral: Secondary | ICD-10-CM | POA: Diagnosis not present

## 2024-06-20 DIAGNOSIS — E785 Hyperlipidemia, unspecified: Secondary | ICD-10-CM

## 2024-06-20 DIAGNOSIS — Z789 Other specified health status: Secondary | ICD-10-CM

## 2024-06-20 DIAGNOSIS — H60502 Unspecified acute noninfective otitis externa, left ear: Secondary | ICD-10-CM

## 2024-06-20 NOTE — Progress Notes (Signed)
 Dear Dr. Antonio Meth, Here is my assessment for our mutual patient, Donna Tapia. Thank you for allowing me the opportunity to care for your patient. Please do not hesitate to contact me should you have any other questions. Sincerely, Chyrl Cohen PA-C  Otolaryngology Clinic Note Referring provider: Dr. Antonio Meth HPI:  Donna Tapia is a 73 y.o. female kindly referred by Dr. Antonio Meth   The patient is a 73 year old female seen in our office for evaluation of cerumen impaction.  The patient notes that she has developed wax in her left ear approximately 1 week ago, she attempted to clean it out with baby oil this was unsuccessful.  She notes this started to irritate her ear.  She saw her primary care who attempted removal in office, again this was unsuccessful.  There was some concern for infection at that office visit, she was placed on otic drops and oral antibiotics.  She notes she has been using them.  She notes that due to irritation of her stomach she has only been taking one of the Augmentin  daily.  She notes decreased hearing bilaterally.   Independent Review of Additional Tests or Records:  Primary care office visit note on 06/14/2024   PMH/Meds/All/SocHx/FamHx/ROS:   Past Medical History:  Diagnosis Date   Anemia    Chicken pox    Environmental allergies    Hepatitis B 1978   Hyperlipidemia      Past Surgical History:  Procedure Laterality Date   ABDOMINAL HYSTERECTOMY     APPENDECTOMY     EVISCERATION  01/07/2012   Procedure: EVISCERATION REPAIR;  Surgeon: Arlyss King, MD;  Location: [facility] OR;  Service: Ophthalmology;  Laterality: Left;  Evisceration and removal of contents left eye   LUMBAR LAMINECTOMY/DECOMPRESSION MICRODISCECTOMY  01/04/2012   Procedure: LUMBAR LAMINECTOMY/DECOMPRESSION MICRODISCECTOMY 1 LEVEL;  Surgeon: Darina MALVA Boehringer, MD;  Location: [facility];  Service: Neurosurgery;  Laterality: N/A;  Lumbar Laminectomy for Epidural Abscess   TEE WITHOUT CARDIOVERSION   01/10/2012   Procedure: TRANSESOPHAGEAL ECHOCARDIOGRAM (TEE);  Surgeon: Vina LULLA Gull, MD;  Location: [facility] ENDOSCOPY;  Service: Cardiovascular;  Laterality: N/A;    Family History  Adopted: Yes  Problem Relation Age of Onset   Coronary artery disease Mother        Adopted mother   Parkinsonism Mother        Adopted Mother   Colon cancer Mother    Colon cancer Father    Hyperlipidemia Paternal Aunt    Hyperlipidemia Paternal Uncle    Heart disease Other        Both families   Hypertension Other      Social Connections: Not on file      Current Outpatient Medications:    acetaminophen  (TYLENOL ) 325 MG tablet, Take 325 mg by mouth every 4 (four) hours as needed. For pain , Disp: , Rfl:    amoxicillin -clavulanate (AUGMENTIN ) 875-125 MG tablet, Take 1 tablet by mouth 2 (two) times daily., Disp: 20 tablet, Rfl: 0   artificial tears (LACRILUBE) OINT ophthalmic ointment, Apply to eye 4 (four) times daily. (Patient taking differently: Apply to eye as needed.), Disp: 1 Tube, Rfl: 0   calcium  carbonate (OS-CAL - DOSED IN MG OF ELEMENTAL CALCIUM ) 1250 (500 Ca) MG tablet, Take 1 tablet by mouth daily with breakfast., Disp: , Rfl:    Cholecalciferol (VITAMIN D3) 2000 units TABS, Take 1 tablet by mouth daily., Disp: , Rfl:    CO ENZYME Q-10 PO, Take 1 capsule by mouth  daily., Disp: , Rfl:    ezetimibe  (ZETIA ) 10 MG tablet, Take 1 tablet (10 mg total) by mouth daily. (Patient not taking: Reported on 06/14/2024), Disp: 90 tablet, Rfl: 1   folic acid (FOLVITE) 400 MCG tablet, Take 400 mcg by mouth 3 (three) times a week.  (Patient not taking: Reported on 06/20/2024), Disp: , Rfl:    loratadine  (CLARITIN ) 10 MG tablet, Take 1 tablet (10 mg total) by mouth daily., Disp: 30 tablet, Rfl: 5   Multiple Vitamin (MULITIVITAMIN WITH MINERALS) TABS, Take 1 tablet by mouth daily., Disp: , Rfl:    ofloxacin  (FLOXIN ) 0.3 % OTIC solution, Place 10 drops into the left ear daily., Disp: 10 mL, Rfl: 0   Omega-3 Fatty  Acids (FISH OIL) 1000 MG CAPS, Take 1 capsule by mouth daily., Disp: , Rfl:    sertraline  (ZOLOFT ) 100 MG tablet, Take 1 tablet (100 mg total) by mouth daily., Disp: 90 tablet, Rfl: 3   Vitamin D , Ergocalciferol , (DRISDOL ) 1.25 MG (50000 UNIT) CAPS capsule, TAKE 1 CAPSULE BY MOUTH EVERY 7 DAYS, Disp: 4 capsule, Rfl: 4   Physical Exam:   BP 121/68   Pulse 77   SpO2 95%   Pertinent Findings  CN II-XII intact Bilateral cerumen impaction Weber 512: equal Rinne 512: AC > BC b/l  Anterior rhinoscopy: Septum midline; bilateral inferior turbinates with no hypertrophy No lesions of oral cavity/oropharynx; dentition within normal limits No obviously palpable neck masses/lymphadenopathy/thyromegaly No respiratory distress or stridor  Seprately Identifiable Procedures:  Procedure: Bilateral ear microscopy and cerumen removal using microscope (CPT [id number]) - Mod 50 Pre-procedure diagnosis: bilateral cerumen impaction external auditory canals Post-procedure diagnosis: same Indication: bilateral cerumen impaction; given patient's otologic complaints and history as well as for improved and comprehensive examination of external ear and tympanic membrane, bilateral otologic examination using microscope was performed and impacted cerumen removed  Procedure: Patient was placed semi-recumbent. Both ear canals were examined using the microscope with findings above. Cerumen removed from bilateral external auditory canals using suction and currette with improvement in EAC examination and patency. Left: EAC was patent. TM was intact . Middle ear was aerated. Drainage: none Right: EAC was patent. TM was intact . Middle ear was aerated . Drainage: none Patient tolerated the procedure well.   Impression & Plans:  [name] is a 73 y.o. female with the following   Cerumen impaction- Bilateral cerumen impaction removed without difficulty.  She did have some discharge noted in the left ear with some canal  erythema, I would describe this as mild otitis externa.  I would recommend she continue using the otic drops, she may discontinue the oral antibiotics.  This will likely resolve without difficulty.  Her hearing was back at baseline.  If she develops any new or worsening signs or symptoms I like to see her back in the office.  Otherwise she may follow-up in 6 to 12 months for repeat evaluation and cerumen removal.  The patient verbalized understanding and agreement to today's plan had no further questions or concerns.   - f/u 6 to 12 months for repeat evaluation     Thank you for allowing me the opportunity to care for your patient. Please do not hesitate to contact me should you have any other questions.  Sincerely, Chyrl Cohen PA-C [facility] Phone: [phone] Fax: [phone]  06/20/2024, 3:16 PM

## 2024-06-28 ENCOUNTER — Telehealth: Payer: Self-pay

## 2024-06-28 NOTE — Telephone Encounter (Signed)
 Copied from CRM [id number]. Topic: Clinical - Lab/Test Results >> Jun 28, 2024 11:12 AM Macario HERO wrote: Reason for CRM: Patient called requesting a mailed copy of her most recent labs, chest x-ray and CT scan. Stated she is unable to look at it on MyChart due to her vision.

## 2024-06-29 NOTE — Telephone Encounter (Signed)
 Results mailed to patient;.

## 2024-07-19 ENCOUNTER — Telehealth: Payer: Self-pay

## 2024-07-19 NOTE — Telephone Encounter (Signed)
 Spoke w/ Pt- informed that she will no longer need a prescription for covid vaccine.

## 2024-07-19 NOTE — Telephone Encounter (Signed)
 Copied from CRM [id number]. Topic: Clinical - Medication Question >> Jul 19, 2024 10:31 AM Donna Tapia wrote: Reason for CRM: Patient is calling in requesting a prescription for her Covid vaccine. Please advise the patient.

## 2024-08-02 ENCOUNTER — Telehealth: Payer: Self-pay | Admitting: Family Medicine

## 2024-08-02 NOTE — Telephone Encounter (Signed)
 Pt dropped off papers to be filled out by pcp. Please call pt when when papers are ready to be picked up. Placed papers in pcps box.

## 2024-08-02 NOTE — Telephone Encounter (Signed)
Form placed in folder

## 2024-08-07 NOTE — Telephone Encounter (Signed)
 Form completed. Pt made aware. Placed at the front.

## 2024-08-21 ENCOUNTER — Telehealth: Payer: Self-pay | Admitting: *Deleted

## 2024-08-21 DIAGNOSIS — F418 Other specified anxiety disorders: Secondary | ICD-10-CM

## 2024-08-21 MED ORDER — SERTRALINE HCL 100 MG PO TABS: 100.0000 mg | ORAL_TABLET | Freq: Every day | ORAL | 1 refills | Status: AC

## 2024-08-21 NOTE — Telephone Encounter (Signed)
 Copied from CRM [id number]. Topic: Clinical - Prescription Issue >> Aug 21, 2024  4:53 PM Dedra B wrote: Reason for CRM: Pt's prescription for sertraline  was called in after her last appt on 8/14, but the pharmacy is saying they don't have it on file.

## 2024-10-03 ENCOUNTER — Ambulatory Visit: Admitting: Emergency Medicine

## 2024-10-03 ENCOUNTER — Encounter: Payer: Self-pay | Admitting: Emergency Medicine

## 2024-10-03 VITALS — BP 92/52 | HR 87 | Temp 98.2°F | Ht 68.0 in | Wt 123.2 lb

## 2024-10-03 DIAGNOSIS — J301 Allergic rhinitis due to pollen: Secondary | ICD-10-CM

## 2024-10-03 DIAGNOSIS — J479 Bronchiectasis, uncomplicated: Secondary | ICD-10-CM | POA: Diagnosis not present

## 2024-10-03 NOTE — Assessment & Plan Note (Addendum)
 Overall doing well with minimal mucus burden.  She does have intermittent clear mucus when her allergies are active.  She has not required Mucinex, flutter valve, etc.  Her most recent CT was a year ago and it was stable.  Plan to repeat in November 2026.  Flu shot, COVID-19 vaccine, Pneumovax are all up-to-date.  We talked about possibly getting the RSV vaccine.  She is going to consider this

## 2024-10-03 NOTE — Assessment & Plan Note (Signed)
 She has been able to change her loratadine  to as needed.  We did discuss getting back to daily use if she begins to have more mucus burden, more cough

## 2024-10-03 NOTE — Progress Notes (Signed)
 I think a printed uncle know if I did that I can tell all right now see  Subjective:    Patient ID: Donna Tapia, female    DOB: 06/21/1951, 73 y.o.   MRN: [id number]  HPI  ROV 08/25/2023 --follow-up visit for 73 year old woman, never smoker, whom I have seen in the past for bronchiectasis in the left upper lobe in the aftermath of a severe left-sided HCAP (2018).  She has some chronic cough with a globus sensation and nasal congestion/allergies.  Occasional GERD.  She was treated for a PNA in June and CXR showed B infiltrates. Then she had COVID-19 in July and was treated with Paxlovid .  A chest x-ray at that time showed improved left basilar aeration and some stable chronic scar in the bilateral upper lobes. She feels that she is better, approaching baseline. She has nasal congestion and is on loratadine , benadryl .   ROV 10/03/2024 --pleasant 73 year old woman, never smoker with a history of bronchiectasis likely precipitated by severe left upper lobe CAP.  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  Often exacerbated by nasal congestion and allergies, occasionally GERD.  We repeated her CT scan of the chest last year which was stable.  Remains on loratadine  prn. She goes a few weeks without any cough, then has a few days of cough - can be triggered by seasonal changes, leaves. A spell can sometimes be associated with SOB. No flares or PNA since I last saw her. Flu and covid are up to date. She is considering RSV vaccine.   CT scan of the chest 09/14/2023 reviewed by me showed stable left upper lobe bronchiectatic change with some tree-in-bud nodularity in the lingula, bilateral lower lobes, right upper lobe that were also stable.   Review of Systems As per HPI  Past Medical History:  Diagnosis Date   Anemia    Chicken pox    Environmental allergies    Hepatitis B 1978   Hyperlipidemia      Family History  Adopted:  Yes  Problem Relation Age of Onset   Coronary artery disease Mother        Adopted mother   Parkinsonism Mother        Adopted Mother   Colon cancer Mother    Colon cancer Father    Hyperlipidemia Paternal Aunt    Hyperlipidemia Paternal Uncle    Heart disease Other        Both families   Hypertension Other      Social History   Socioeconomic History   Marital status: Single    Spouse name: Not on file   Number of children: Not on file   Years of education: Not on file   Highest education level: Not on file  Occupational History   Occupation: retired  Tobacco Use   Smoking status: Never   Smokeless tobacco: Never  Substance and Sexual Activity   Alcohol  use: No   Drug use: No   Sexual activity: Never  Other Topics Concern   Not on file  Social History Narrative   Exercise- walks   Social Drivers of Health   Financial Resource Strain: Low Risk  (11/30/2019)   Overall Financial Resource Strain (CARDIA)    Difficulty of Paying Living Expenses: Not hard at all  Food Insecurity: No Food Insecurity (11/30/2019)   Hunger Vital Sign    Worried About Running Out of Food in the Last Year: Never  true    Ran Out of Food in the Last Year: Never true  Transportation Needs: No Transportation Needs (11/30/2019)   PRAPARE - Administrator, Civil Service (Medical): No    Lack of Transportation (Non-Medical): No  Physical Activity: Not on file  Stress: Not on file  Social Connections: Not on file  Intimate Partner Violence: Not on file     Allergies  Allergen Reactions   Elemental Sulfur Anaphylaxis and Rash   Statins Other (See Comments)    myalgia     Outpatient Medications Prior to Visit  Medication Sig Dispense Refill   acetaminophen  (TYLENOL ) 325 MG tablet Take 325 mg by mouth every 4 (four) hours as needed. For pain      artificial tears (LACRILUBE) OINT ophthalmic ointment Apply to eye 4 (four) times daily. (Patient taking differently: Apply to eye as  needed.) 1 Tube 0   CO ENZYME Q-10 PO Take 1 capsule by mouth daily.     folic acid (FOLVITE) 400 MCG tablet Take 400 mcg by mouth 3 (three) times a week.      loratadine  (CLARITIN ) 10 MG tablet Take 1 tablet (10 mg total) by mouth daily. 30 tablet 5   Multiple Vitamin (MULITIVITAMIN WITH MINERALS) TABS Take 1 tablet by mouth daily.     Multiple Vitamin (MULTIVITAMIN) tablet Take 1 tablet by mouth daily.     Omega-3 Fatty Acids (FISH OIL) 1000 MG CAPS Take 1 capsule by mouth daily.     sertraline  (ZOLOFT ) 100 MG tablet Take 1 tablet (100 mg total) by mouth daily. 90 tablet 1   amoxicillin -clavulanate (AUGMENTIN ) 875-125 MG tablet Take 1 tablet by mouth 2 (two) times daily. (Patient not taking: Reported on 10/03/2024) 20 tablet 0   calcium  carbonate (OS-CAL - DOSED IN MG OF ELEMENTAL CALCIUM ) 1250 (500 Ca) MG tablet Take 1 tablet by mouth daily with breakfast. (Patient not taking: Reported on 10/03/2024)     Cholecalciferol (VITAMIN D3) 2000 units TABS Take 1 tablet by mouth daily. (Patient not taking: Reported on 10/03/2024)     ezetimibe  (ZETIA ) 10 MG tablet Take 1 tablet (10 mg total) by mouth daily. (Patient not taking: Reported on 10/03/2024) 90 tablet 1   ofloxacin  (FLOXIN ) 0.3 % OTIC solution Place 10 drops into the left ear daily. (Patient not taking: Reported on 10/03/2024) 10 mL 0   Vitamin D , Ergocalciferol , (DRISDOL ) 1.25 MG (50000 UNIT) CAPS capsule TAKE 1 CAPSULE BY MOUTH EVERY 7 DAYS (Patient not taking: Reported on 10/03/2024) 4 capsule 4   No facility-administered medications prior to visit.        Objective:   Physical Exam Vitals:   10/03/24 1102  BP: (!) 92/52  Pulse: 87  Temp: 98.2 F (36.8 C)  SpO2: 96%  Weight: 123 lb 3.2 oz (55.9 kg)  Height: 5' 8 (1.727 m)   Gen: Pleasant, well-nourished, in no distress,  normal affect  ENT: No lesions,  mouth clear,  oropharynx clear, no postnasal drip  Neck: No JVD, no stridor  Lungs: No use of accessory muscles, no crackles  or wheezing on normal respiration, no wheeze on forced expiration  Cardiovascular: RRR, heart sounds normal, no murmur or gallops, no peripheral edema  Musculoskeletal: No deformities, no cyanosis or clubbing  Neuro: alert, awake, non focal  Skin: Warm, no lesions or rash       Assessment & Plan:  Bronchiectasis without complication (HCC) Overall doing well with minimal mucus burden.  She does have intermittent  clear mucus when her allergies are active.  She has not required Mucinex, flutter valve, etc.  Her most recent CT was a year ago and it was stable.  Plan to repeat in November 2026.  Flu shot, COVID-19 vaccine, Pneumovax are all up-to-date.  We talked about possibly getting the RSV vaccine.  She is going to consider this  Allergic rhinitis She has been able to change her loratadine  to as needed.  We did discuss getting back to daily use if she begins to have more mucus burden, more cough    Lamar Chris, MD, PhD 10/03/2024, 11:24 AM [facility] [phone] or if no answer before 7:00PM call [phone] For any issues after 7:00PM please call eLink [phone]

## 2024-10-03 NOTE — Patient Instructions (Addendum)
 Your flu shot, COVID-19 vaccine and pneumonia shot are all up-to-date You could consider getting the RSV vaccine at your local pharmacy Continue to use your loratadine  when you need it.  If you are mucus, congestion and cough increase then would recommend getting on this every single day We reviewed your CT chest from last year.  This is stable.  Good news. We will plan to repeat a CT scan of the chest in November 2026. Follow with Dr. Shelah in November 2026 after your scan so we can review those results together.  Please call if you develop any new respiratory symptoms so we can see you sooner
